# Patient Record
Sex: Female | Born: 1965 | Race: Black or African American | Hispanic: No | Marital: Single | Smoking: Former smoker
Health system: Southern US, Community
[De-identification: ages and names within clinical notes are randomized; demographics above are authoritative.]

## PROBLEM LIST (undated history)

## (undated) DIAGNOSIS — F329 Major depressive disorder, single episode, unspecified: Secondary | ICD-10-CM

## (undated) DIAGNOSIS — E669 Obesity, unspecified: Secondary | ICD-10-CM

## (undated) DIAGNOSIS — R591 Generalized enlarged lymph nodes: Secondary | ICD-10-CM

## (undated) DIAGNOSIS — D500 Iron deficiency anemia secondary to blood loss (chronic): Secondary | ICD-10-CM

## (undated) DIAGNOSIS — K469 Unspecified abdominal hernia without obstruction or gangrene: Principal | ICD-10-CM

## (undated) DIAGNOSIS — G4730 Sleep apnea, unspecified: Secondary | ICD-10-CM

## (undated) DIAGNOSIS — D869 Sarcoidosis, unspecified: Principal | ICD-10-CM

## (undated) DIAGNOSIS — D539 Nutritional anemia, unspecified: Secondary | ICD-10-CM

## (undated) DIAGNOSIS — L409 Psoriasis, unspecified: Secondary | ICD-10-CM

## (undated) DIAGNOSIS — F419 Anxiety disorder, unspecified: Secondary | ICD-10-CM

## (undated) DIAGNOSIS — F32A Depression, unspecified: Secondary | ICD-10-CM

## (undated) DIAGNOSIS — M898X9 Other specified disorders of bone, unspecified site: Principal | ICD-10-CM

## (undated) DIAGNOSIS — I10 Essential (primary) hypertension: Secondary | ICD-10-CM

## (undated) DIAGNOSIS — N921 Excessive and frequent menstruation with irregular cycle: Secondary | ICD-10-CM

## (undated) HISTORY — PX: TUBAL LIGATION: SHX77

## (undated) HISTORY — DX: Depression, unspecified: F32.A

## (undated) HISTORY — DX: Major depressive disorder, single episode, unspecified: F32.9

## (undated) HISTORY — DX: Iron deficiency anemia secondary to blood loss (chronic): D50.0

## (undated) HISTORY — DX: Other specified disorders of bone, unspecified site: M89.8X9

## (undated) HISTORY — DX: Unspecified abdominal hernia without obstruction or gangrene: K46.9

## (undated) HISTORY — DX: Generalized enlarged lymph nodes: R59.1

## (undated) HISTORY — DX: Sarcoidosis, unspecified: D86.9

## (undated) HISTORY — DX: Anxiety disorder, unspecified: F41.9

## (undated) HISTORY — DX: Nutritional anemia, unspecified: D53.9

## (undated) HISTORY — DX: Excessive and frequent menstruation with irregular cycle: N92.1

---

## 2007-10-15 LAB — PULMONARY FUNCTION TEST

## 2008-02-24 LAB — PULMONARY FUNCTION TEST

## 2009-10-21 ENCOUNTER — Inpatient Hospital Stay (HOSPITAL_COMMUNITY): Admission: EM | Admit: 2009-10-21 | Discharge: 2009-10-24 | Payer: Self-pay | Admitting: Emergency Medicine

## 2009-11-01 ENCOUNTER — Encounter: Admission: RE | Admit: 2009-11-01 | Discharge: 2009-11-01 | Payer: Self-pay | Admitting: Internal Medicine

## 2010-10-22 LAB — POCT I-STAT 3, VENOUS BLOOD GAS (G3P V)
Acid-Base Excess: 1 mmol/L (ref 0.0–2.0)
O2 Saturation: 30 %
pCO2, Ven: 44.3 mmHg — ABNORMAL LOW (ref 45.0–50.0)

## 2010-10-22 LAB — URINE CULTURE

## 2010-10-22 LAB — GLUCOSE, CAPILLARY
Glucose-Capillary: 196 mg/dL — ABNORMAL HIGH (ref 70–99)
Glucose-Capillary: 213 mg/dL — ABNORMAL HIGH (ref 70–99)
Glucose-Capillary: 323 mg/dL — ABNORMAL HIGH (ref 70–99)
Glucose-Capillary: 339 mg/dL — ABNORMAL HIGH (ref 70–99)
Glucose-Capillary: 341 mg/dL — ABNORMAL HIGH (ref 70–99)
Glucose-Capillary: 342 mg/dL — ABNORMAL HIGH (ref 70–99)
Glucose-Capillary: 363 mg/dL — ABNORMAL HIGH (ref 70–99)
Glucose-Capillary: 460 mg/dL — ABNORMAL HIGH (ref 70–99)
Glucose-Capillary: 511 mg/dL — ABNORMAL HIGH (ref 70–99)

## 2010-10-22 LAB — DIFFERENTIAL
Basophils Absolute: 0.1 10*3/uL (ref 0.0–0.1)
Eosinophils Relative: 3 % (ref 0–5)
Lymphs Abs: 0.9 10*3/uL (ref 0.7–4.0)
Monocytes Relative: 10 % (ref 3–12)
Neutrophils Relative %: 68 % (ref 43–77)

## 2010-10-22 LAB — COMPREHENSIVE METABOLIC PANEL
AST: 22 U/L (ref 0–37)
BUN: 21 mg/dL (ref 6–23)
CO2: 24 mEq/L (ref 19–32)
Calcium: 10.6 mg/dL — ABNORMAL HIGH (ref 8.4–10.5)
Chloride: 91 mEq/L — ABNORMAL LOW (ref 96–112)
Creatinine, Ser: 1.15 mg/dL (ref 0.4–1.2)
GFR calc Af Amer: >60 mL/min (ref >60)
GFR calc non Af Amer: 51 mL/min — ABNORMAL LOW (ref >60)
Glucose, Bld: 578 mg/dL (ref 70–99)
Total Bilirubin: 0.6 mg/dL (ref 0.3–1.2)

## 2010-10-22 LAB — LIPASE, BLOOD: Lipase: 81 U/L — ABNORMAL HIGH (ref 11–59)

## 2010-10-22 LAB — CBC
HCT: 29.5 % — ABNORMAL LOW (ref 36.0–46.0)
HCT: 33.9 % — ABNORMAL LOW (ref 36.0–46.0)
Hemoglobin: 10.8 g/dL — ABNORMAL LOW (ref 12.0–15.0)
Hemoglobin: 9.2 g/dL — ABNORMAL LOW (ref 12.0–15.0)
Hemoglobin: 9.5 g/dL — ABNORMAL LOW (ref 12.0–15.0)
MCHC: 31.8 g/dL (ref 30.0–36.0)
MCV: 62.5 fL — ABNORMAL LOW (ref 78.0–100.0)
MCV: 63.3 fL — ABNORMAL LOW (ref 78.0–100.0)
MCV: 63.7 fL — ABNORMAL LOW (ref 78.0–100.0)
Platelets: 172 10*3/uL (ref 150–400)
RBC: 4.63 MIL/uL (ref 3.87–5.11)
RBC: 4.75 MIL/uL (ref 3.87–5.11)
RBC: 5.03 MIL/uL (ref 3.87–5.11)
RBC: 5.42 MIL/uL — ABNORMAL HIGH (ref 3.87–5.11)
RDW: 23.3 % — ABNORMAL HIGH (ref 11.5–15.5)
WBC: 3.8 10*3/uL — ABNORMAL LOW (ref 4.0–10.5)
WBC: 3.9 10*3/uL — ABNORMAL LOW (ref 4.0–10.5)
WBC: 5 10*3/uL (ref 4.0–10.5)

## 2010-10-22 LAB — BASIC METABOLIC PANEL
BUN: 7 mg/dL (ref 6–23)
CO2: 22 mEq/L (ref 19–32)
Calcium: 8.4 mg/dL (ref 8.4–10.5)
Chloride: 104 mEq/L (ref 96–112)
Chloride: 106 mEq/L (ref 96–112)
Chloride: 98 mEq/L (ref 96–112)
Creatinine, Ser: 0.74 mg/dL (ref 0.4–1.2)
GFR calc Af Amer: >60 mL/min (ref >60)
GFR calc Af Amer: >60 mL/min (ref >60)
GFR calc non Af Amer: >60 mL/min (ref >60)
GFR calc non Af Amer: >60 mL/min (ref >60)
Glucose, Bld: 444 mg/dL — ABNORMAL HIGH (ref 70–99)
Potassium: 3.9 mEq/L (ref 3.5–5.1)
Potassium: 4.2 mEq/L (ref 3.5–5.1)
Sodium: 130 mEq/L — ABNORMAL LOW (ref 135–145)
Sodium: 133 mEq/L — ABNORMAL LOW (ref 135–145)
Sodium: 136 mEq/L (ref 135–145)

## 2010-10-22 LAB — IRON AND TIBC
Iron: 23 ug/dL — ABNORMAL LOW (ref 42–135)
Saturation Ratios: 6 % — ABNORMAL LOW (ref 20–55)
TIBC: 363 ug/dL (ref 250–470)

## 2010-10-22 LAB — URINALYSIS, ROUTINE W REFLEX MICROSCOPIC
Glucose, UA: >1000 mg/dL — AB
Nitrite: NEGATIVE
Specific Gravity, Urine: 1.027 (ref 1.005–1.030)
pH: 5.5 (ref 5.0–8.0)

## 2010-10-22 LAB — URINE MICROSCOPIC-ADD ON

## 2010-10-22 LAB — MRSA PCR SCREENING: MRSA by PCR: NEGATIVE

## 2010-10-22 LAB — FECAL LACTOFERRIN, QUANT: Fecal Lactoferrin: NEGATIVE

## 2010-10-22 LAB — CLOSTRIDIUM DIFFICILE EIA: C difficile Toxins A+B, EIA: NEGATIVE

## 2010-10-22 LAB — STOOL CULTURE

## 2010-10-22 LAB — FERRITIN: Ferritin: 27 ng/mL (ref 10–291)

## 2010-10-22 LAB — TSH: TSH: 1.214 u[IU]/mL (ref 0.350–4.500)

## 2010-10-22 LAB — FOLATE: Folate: 12.5 ng/mL

## 2010-12-06 ENCOUNTER — Other Ambulatory Visit: Payer: Self-pay | Admitting: Family Medicine

## 2010-12-06 DIAGNOSIS — Z1231 Encounter for screening mammogram for malignant neoplasm of breast: Secondary | ICD-10-CM

## 2010-12-11 ENCOUNTER — Encounter (HOSPITAL_BASED_OUTPATIENT_CLINIC_OR_DEPARTMENT_OTHER): Payer: Medicare Other | Admitting: Oncology

## 2010-12-11 ENCOUNTER — Other Ambulatory Visit: Payer: Self-pay | Admitting: Oncology

## 2010-12-11 ENCOUNTER — Encounter (HOSPITAL_COMMUNITY): Payer: Medicare Other | Attending: Oncology

## 2010-12-11 ENCOUNTER — Encounter: Payer: Self-pay | Admitting: Oncology

## 2010-12-11 DIAGNOSIS — D649 Anemia, unspecified: Secondary | ICD-10-CM

## 2010-12-11 DIAGNOSIS — D509 Iron deficiency anemia, unspecified: Secondary | ICD-10-CM

## 2010-12-11 LAB — CHCC SMEAR

## 2010-12-11 LAB — CBC & DIFF AND RETIC
Basophils Absolute: 0 10*3/uL (ref 0.0–0.1)
Eosinophils Absolute: 0.1 10*3/uL (ref 0.0–0.5)
HGB: 7.6 g/dL — ABNORMAL LOW (ref 11.6–15.9)
Immature Retic Fract: 13.9 % — ABNORMAL HIGH (ref 0.00–10.70)
MONO#: 0.4 10*3/uL (ref 0.1–0.9)
NEUT#: 2.9 10*3/uL (ref 1.5–6.5)
RBC: 4.73 10*6/uL (ref 3.70–5.45)
RDW: 20.4 % — ABNORMAL HIGH (ref 11.2–14.5)
Retic %: 0.96 % (ref 0.50–1.50)
Retic Ct Abs: 45.41 10*3/uL (ref 18.30–72.70)
WBC: 4.6 10*3/uL (ref 3.9–10.3)
lymph#: 1.2 10*3/uL (ref 0.9–3.3)

## 2010-12-11 LAB — MORPHOLOGY: PLT EST: ADEQUATE

## 2010-12-12 LAB — COMPREHENSIVE METABOLIC PANEL
ALT: 15 U/L (ref 0–35)
AST: 14 U/L (ref 0–37)
Calcium: 10.2 mg/dL (ref 8.4–10.5)
Chloride: 105 mEq/L (ref 96–112)
Creatinine, Ser: 0.9 mg/dL (ref 0.40–1.20)

## 2010-12-12 LAB — HAPTOGLOBIN: Haptoglobin: 246 mg/dL — ABNORMAL HIGH (ref 16–200)

## 2010-12-12 LAB — IRON AND TIBC
Iron: 22 ug/dL — ABNORMAL LOW (ref 42–145)
UIBC: 448 ug/dL

## 2010-12-12 LAB — FERRITIN: Ferritin: 6 ng/mL — ABNORMAL LOW (ref 10–291)

## 2010-12-13 ENCOUNTER — Ambulatory Visit
Admission: RE | Admit: 2010-12-13 | Discharge: 2010-12-13 | Disposition: A | Discharge status: Home / Self Care | Payer: Medicare Other | Source: Ambulatory Visit | Attending: Family Medicine | Admitting: Family Medicine

## 2010-12-13 DIAGNOSIS — Z1231 Encounter for screening mammogram for malignant neoplasm of breast: Secondary | ICD-10-CM

## 2010-12-19 ENCOUNTER — Encounter (HOSPITAL_COMMUNITY): Payer: Self-pay

## 2010-12-19 ENCOUNTER — Ambulatory Visit (HOSPITAL_COMMUNITY)
Admission: RE | Admit: 2010-12-19 | Discharge: 2010-12-19 | Disposition: A | Discharge status: Home / Self Care | Payer: Medicare Other | Source: Ambulatory Visit | Attending: Oncology | Admitting: Oncology

## 2010-12-19 DIAGNOSIS — M51379 Other intervertebral disc degeneration, lumbosacral region without mention of lumbar back pain or lower extremity pain: Secondary | ICD-10-CM | POA: Insufficient documentation

## 2010-12-19 DIAGNOSIS — K639 Disease of intestine, unspecified: Secondary | ICD-10-CM | POA: Insufficient documentation

## 2010-12-19 DIAGNOSIS — K921 Melena: Secondary | ICD-10-CM | POA: Insufficient documentation

## 2010-12-19 DIAGNOSIS — R142 Eructation: Secondary | ICD-10-CM | POA: Insufficient documentation

## 2010-12-19 DIAGNOSIS — M5137 Other intervertebral disc degeneration, lumbosacral region: Secondary | ICD-10-CM | POA: Insufficient documentation

## 2010-12-19 DIAGNOSIS — D649 Anemia, unspecified: Secondary | ICD-10-CM | POA: Insufficient documentation

## 2010-12-19 DIAGNOSIS — R599 Enlarged lymph nodes, unspecified: Secondary | ICD-10-CM | POA: Insufficient documentation

## 2010-12-19 DIAGNOSIS — R197 Diarrhea, unspecified: Secondary | ICD-10-CM | POA: Insufficient documentation

## 2010-12-19 DIAGNOSIS — R141 Gas pain: Secondary | ICD-10-CM | POA: Insufficient documentation

## 2010-12-19 DIAGNOSIS — R1032 Left lower quadrant pain: Secondary | ICD-10-CM | POA: Insufficient documentation

## 2010-12-19 HISTORY — DX: Essential (primary) hypertension: I10

## 2010-12-19 MED ORDER — IOHEXOL 300 MG/ML  SOLN
125.0000 mL | Freq: Once | INTRAMUSCULAR | Status: AC | PRN
  Administered 2010-12-19: 125 mL via INTRAVENOUS

## 2010-12-25 ENCOUNTER — Encounter (HOSPITAL_BASED_OUTPATIENT_CLINIC_OR_DEPARTMENT_OTHER): Payer: Medicare Other | Admitting: Oncology

## 2010-12-25 ENCOUNTER — Other Ambulatory Visit: Payer: Self-pay | Admitting: Oncology

## 2010-12-25 DIAGNOSIS — D509 Iron deficiency anemia, unspecified: Secondary | ICD-10-CM

## 2010-12-25 LAB — CBC WITH DIFFERENTIAL/PLATELET
BASO%: 1 % (ref 0.0–2.0)
Eosinophils Absolute: 0.1 10*3/uL (ref 0.0–0.5)
HCT: 29.5 % — ABNORMAL LOW (ref 34.8–46.6)
MCHC: 29.2 g/dL — ABNORMAL LOW (ref 31.5–36.0)
MONO#: 0.5 10*3/uL (ref 0.1–0.9)
NEUT#: 3 10*3/uL (ref 1.5–6.5)
Platelets: 236 10*3/uL (ref 145–400)
RBC: 4.94 10*6/uL (ref 3.70–5.45)
WBC: 4.8 10*3/uL (ref 3.9–10.3)
lymph#: 1.1 10*3/uL (ref 0.9–3.3)
nRBC: 0 % (ref 0–0)

## 2011-01-02 ENCOUNTER — Encounter (HOSPITAL_BASED_OUTPATIENT_CLINIC_OR_DEPARTMENT_OTHER): Payer: Medicare Other | Admitting: Oncology

## 2011-01-02 ENCOUNTER — Other Ambulatory Visit: Payer: Self-pay | Admitting: Oncology

## 2011-01-02 DIAGNOSIS — D649 Anemia, unspecified: Secondary | ICD-10-CM

## 2011-01-02 DIAGNOSIS — R591 Generalized enlarged lymph nodes: Secondary | ICD-10-CM

## 2011-01-02 DIAGNOSIS — D509 Iron deficiency anemia, unspecified: Secondary | ICD-10-CM

## 2011-01-02 DIAGNOSIS — R599 Enlarged lymph nodes, unspecified: Secondary | ICD-10-CM

## 2011-01-02 DIAGNOSIS — I10 Essential (primary) hypertension: Secondary | ICD-10-CM

## 2011-01-02 DIAGNOSIS — E119 Type 2 diabetes mellitus without complications: Secondary | ICD-10-CM

## 2011-01-02 LAB — CBC WITH DIFFERENTIAL/PLATELET
Basophils Absolute: 0 10*3/uL (ref 0.0–0.1)
EOS%: 2.5 % (ref 0.0–7.0)
Eosinophils Absolute: 0.1 10*3/uL (ref 0.0–0.5)
HCT: 31.8 % — ABNORMAL LOW (ref 34.8–46.6)
HGB: 9.4 g/dL — ABNORMAL LOW (ref 11.6–15.9)
MONO#: 0.7 10*3/uL (ref 0.1–0.9)
NEUT#: 2.4 10*3/uL (ref 1.5–6.5)
NEUT%: 49.1 % (ref 38.4–76.8)
RDW: 27.3 % — ABNORMAL HIGH (ref 11.2–14.5)
WBC: 4.8 10*3/uL (ref 3.9–10.3)
lymph#: 1.6 10*3/uL (ref 0.9–3.3)

## 2011-01-02 LAB — FERRITIN: Ferritin: 68 ng/mL (ref 10–291)

## 2011-01-22 ENCOUNTER — Encounter (HOSPITAL_BASED_OUTPATIENT_CLINIC_OR_DEPARTMENT_OTHER): Payer: Medicare Other | Admitting: Oncology

## 2011-01-22 ENCOUNTER — Other Ambulatory Visit: Payer: Self-pay | Admitting: Oncology

## 2011-01-22 DIAGNOSIS — D509 Iron deficiency anemia, unspecified: Secondary | ICD-10-CM

## 2011-01-22 LAB — CBC WITH DIFFERENTIAL/PLATELET
BASO%: 0.1 % (ref 0.0–2.0)
Eosinophils Absolute: 0.2 10*3/uL (ref 0.0–0.5)
HCT: 32.7 % — ABNORMAL LOW (ref 34.8–46.6)
HGB: 10 g/dL — ABNORMAL LOW (ref 11.6–15.9)
MCHC: 30.4 g/dL — ABNORMAL LOW (ref 31.5–36.0)
MONO#: 0.6 10*3/uL (ref 0.1–0.9)
NEUT#: 2.9 10*3/uL (ref 1.5–6.5)
NEUT%: 60.3 % (ref 38.4–76.8)
WBC: 4.8 10*3/uL (ref 3.9–10.3)
lymph#: 1.1 10*3/uL (ref 0.9–3.3)

## 2011-02-06 ENCOUNTER — Encounter (HOSPITAL_BASED_OUTPATIENT_CLINIC_OR_DEPARTMENT_OTHER): Payer: Medicare Other | Admitting: Oncology

## 2011-02-06 ENCOUNTER — Other Ambulatory Visit: Payer: Self-pay | Admitting: Oncology

## 2011-02-06 DIAGNOSIS — D509 Iron deficiency anemia, unspecified: Secondary | ICD-10-CM

## 2011-02-06 LAB — CBC WITH DIFFERENTIAL/PLATELET
Basophils Absolute: 0 10*3/uL (ref 0.0–0.1)
EOS%: 3.4 % (ref 0.0–7.0)
HCT: 30.8 % — ABNORMAL LOW (ref 34.8–46.6)
HGB: 9.5 g/dL — ABNORMAL LOW (ref 11.6–15.9)
MCH: 19.4 pg — ABNORMAL LOW (ref 25.1–34.0)
MONO#: 0.5 10*3/uL (ref 0.1–0.9)
NEUT%: 58 % (ref 38.4–76.8)
Platelets: 260 10*3/uL (ref 145–400)
lymph#: 1.3 10*3/uL (ref 0.9–3.3)

## 2011-02-12 ENCOUNTER — Ambulatory Visit (INDEPENDENT_AMBULATORY_CARE_PROVIDER_SITE_OTHER): Payer: Medicare Other

## 2011-02-12 ENCOUNTER — Inpatient Hospital Stay (INDEPENDENT_AMBULATORY_CARE_PROVIDER_SITE_OTHER)
Admission: RE | Admit: 2011-02-12 | Discharge: 2011-02-12 | Disposition: A | Discharge status: Home / Self Care | Payer: Medicare Other | Source: Ambulatory Visit | Attending: Family Medicine | Admitting: Family Medicine

## 2011-02-12 DIAGNOSIS — M109 Gout, unspecified: Secondary | ICD-10-CM

## 2011-02-27 ENCOUNTER — Ambulatory Visit (HOSPITAL_COMMUNITY)
Admission: RE | Admit: 2011-02-27 | Discharge: 2011-02-27 | Disposition: A | Discharge status: Home / Self Care | Payer: Medicare Other | Source: Ambulatory Visit | Attending: Gastroenterology | Admitting: Gastroenterology

## 2011-02-27 DIAGNOSIS — K648 Other hemorrhoids: Secondary | ICD-10-CM | POA: Insufficient documentation

## 2011-02-27 DIAGNOSIS — D649 Anemia, unspecified: Secondary | ICD-10-CM | POA: Insufficient documentation

## 2011-02-27 DIAGNOSIS — K625 Hemorrhage of anus and rectum: Secondary | ICD-10-CM | POA: Insufficient documentation

## 2011-02-27 DIAGNOSIS — K644 Residual hemorrhoidal skin tags: Secondary | ICD-10-CM | POA: Insufficient documentation

## 2011-02-28 ENCOUNTER — Ambulatory Visit: Payer: Medicare Other | Admitting: Gastroenterology

## 2011-03-04 ENCOUNTER — Other Ambulatory Visit: Payer: Self-pay | Admitting: Oncology

## 2011-03-04 ENCOUNTER — Encounter (HOSPITAL_BASED_OUTPATIENT_CLINIC_OR_DEPARTMENT_OTHER): Payer: Medicare Other | Admitting: Oncology

## 2011-03-04 DIAGNOSIS — N924 Excessive bleeding in the premenopausal period: Secondary | ICD-10-CM

## 2011-03-04 DIAGNOSIS — D509 Iron deficiency anemia, unspecified: Secondary | ICD-10-CM

## 2011-03-04 LAB — COMPREHENSIVE METABOLIC PANEL
Albumin: 4.1 g/dL (ref 3.5–5.2)
Alkaline Phosphatase: 68 U/L (ref 39–117)
BUN: 15 mg/dL (ref 6–23)
Glucose, Bld: 154 mg/dL — ABNORMAL HIGH (ref 70–99)
Total Bilirubin: 0.3 mg/dL (ref 0.3–1.2)

## 2011-03-04 LAB — FERRITIN: Ferritin: 16 ng/mL (ref 10–291)

## 2011-03-04 LAB — IRON AND TIBC
TIBC: 400 ug/dL (ref 250–470)
UIBC: 367 ug/dL

## 2011-03-04 LAB — CBC WITH DIFFERENTIAL/PLATELET
Basophils Absolute: 0 10*3/uL (ref 0.0–0.1)
Eosinophils Absolute: 0.1 10*3/uL (ref 0.0–0.5)
HGB: 9.9 g/dL — ABNORMAL LOW (ref 11.6–15.9)
LYMPH%: 16.9 % (ref 14.0–49.7)
MCV: 62.9 fL — ABNORMAL LOW (ref 79.5–101.0)
MONO%: 9.9 % (ref 0.0–14.0)
NEUT#: 3.1 10*3/uL (ref 1.5–6.5)
NEUT%: 70.3 % (ref 38.4–76.8)
Platelets: 254 10*3/uL (ref 145–400)

## 2011-03-05 ENCOUNTER — Encounter (HOSPITAL_COMMUNITY): Payer: Medicare Other | Attending: Family Medicine

## 2011-03-05 DIAGNOSIS — E7800 Pure hypercholesterolemia, unspecified: Secondary | ICD-10-CM | POA: Insufficient documentation

## 2011-03-05 DIAGNOSIS — I10 Essential (primary) hypertension: Secondary | ICD-10-CM | POA: Insufficient documentation

## 2011-03-05 DIAGNOSIS — E669 Obesity, unspecified: Secondary | ICD-10-CM | POA: Insufficient documentation

## 2011-03-05 DIAGNOSIS — Z5189 Encounter for other specified aftercare: Secondary | ICD-10-CM | POA: Insufficient documentation

## 2011-03-05 DIAGNOSIS — J45909 Unspecified asthma, uncomplicated: Secondary | ICD-10-CM | POA: Insufficient documentation

## 2011-03-05 DIAGNOSIS — G4730 Sleep apnea, unspecified: Secondary | ICD-10-CM | POA: Insufficient documentation

## 2011-03-05 DIAGNOSIS — E119 Type 2 diabetes mellitus without complications: Secondary | ICD-10-CM | POA: Insufficient documentation

## 2011-03-07 ENCOUNTER — Encounter (HOSPITAL_COMMUNITY)
Admission: RE | Admit: 2011-03-07 | Discharge: 2011-03-07 | Payer: Medicare Other | Source: Ambulatory Visit | Attending: Family Medicine | Admitting: Family Medicine

## 2011-03-07 ENCOUNTER — Other Ambulatory Visit: Payer: Self-pay | Admitting: Family Medicine

## 2011-03-07 LAB — GLUCOSE, CAPILLARY: Glucose-Capillary: 118 mg/dL — ABNORMAL HIGH (ref 70–99)

## 2011-03-12 ENCOUNTER — Encounter (HOSPITAL_COMMUNITY): Payer: Medicare Other

## 2011-03-14 ENCOUNTER — Encounter (HOSPITAL_COMMUNITY): Payer: Medicare Other

## 2011-03-19 ENCOUNTER — Encounter (HOSPITAL_COMMUNITY): Payer: Medicare Other

## 2011-03-21 ENCOUNTER — Encounter (HOSPITAL_COMMUNITY): Payer: Medicare Other

## 2011-03-26 ENCOUNTER — Encounter (HOSPITAL_COMMUNITY): Payer: Medicare Other

## 2011-03-28 ENCOUNTER — Encounter (HOSPITAL_COMMUNITY): Payer: Medicare Other

## 2011-04-02 ENCOUNTER — Encounter (HOSPITAL_COMMUNITY): Payer: Medicare Other | Attending: Family Medicine

## 2011-04-02 DIAGNOSIS — J45909 Unspecified asthma, uncomplicated: Secondary | ICD-10-CM | POA: Insufficient documentation

## 2011-04-02 DIAGNOSIS — I10 Essential (primary) hypertension: Secondary | ICD-10-CM | POA: Insufficient documentation

## 2011-04-02 DIAGNOSIS — E669 Obesity, unspecified: Secondary | ICD-10-CM | POA: Insufficient documentation

## 2011-04-02 DIAGNOSIS — Z5189 Encounter for other specified aftercare: Secondary | ICD-10-CM | POA: Insufficient documentation

## 2011-04-02 DIAGNOSIS — E119 Type 2 diabetes mellitus without complications: Secondary | ICD-10-CM | POA: Insufficient documentation

## 2011-04-02 DIAGNOSIS — E7800 Pure hypercholesterolemia, unspecified: Secondary | ICD-10-CM | POA: Insufficient documentation

## 2011-04-02 DIAGNOSIS — G4730 Sleep apnea, unspecified: Secondary | ICD-10-CM | POA: Insufficient documentation

## 2011-04-04 ENCOUNTER — Encounter (HOSPITAL_COMMUNITY): Payer: Medicare Other

## 2011-04-09 ENCOUNTER — Encounter (HOSPITAL_COMMUNITY): Payer: Medicare Other

## 2011-04-11 ENCOUNTER — Encounter (HOSPITAL_COMMUNITY): Payer: Medicare Other

## 2011-04-16 ENCOUNTER — Encounter (HOSPITAL_COMMUNITY): Payer: Medicare Other

## 2011-04-18 ENCOUNTER — Encounter (HOSPITAL_COMMUNITY): Payer: Medicare Other

## 2011-04-21 NOTE — Op Note (Signed)
  NAMEJLEIGH, Cassandra Burns NO.:  [id number]  MEDICAL RECORD NO.:  [id number]  LOCATION:  [facility]                         FACILITY:  [facility]  PHYSICIAN:  Sharri Loya C. Madilyn Fireman, M.D.    DATE OF BIRTH:  03/01/1966  DATE OF PROCEDURE:  02/27/2011 DATE OF DISCHARGE:                              OPERATIVE REPORT   SURGEON:  Icy Fuhrmann C. Madilyn Fireman, MD  INDICATIONS FOR PROCEDURE:  Rectal bleeding and significant anemia.  PROCEDURE:  The patient was placed in the left lateral decubitus position and placed on the pulse monitor with continuous low-flow oxygen delivered by nasal cannula.  She was sedated with 75 mcg of IV fentanyl and 6 mg of IV Versed.  The Olympus videocolonoscope was inserted into the rectum and advanced to the cecum, confirmed by transillumination at McBurney's point and visualization of the ileocecal valve and appendiceal orifice.  The prep was excellent.  The cecum, ascending, transverse, descending and sigmoid colon all appeared normal with no masses, polyps, diverticuli, or other mucosal abnormalities.  The rectum likewise appeared normal and retroflexed view of the anus did reveal some hyperemic-appearing slightly enlarged internal hemorrhoids as well as some similar-appearing external hemorrhoids without any dominant or thrombosed hemorrhoids seen and no bleeding.  The scope was then withdrawn and the patient returned to the recovery room in stable condition.  She tolerated the procedure well.  There were no immediate complications.  IMPRESSION:  Internal and external hemorrhoids probably accounting for rectal bleeding, although likely not accounting for the degree of anemia, which could be due to a combination of hemorrhoidal loss and heavy period.  PLAN:  Returned to her oncologist for presumed iron therapy.  If ongoing suspicion of GI blood loss, proximal to the colon could pursue EGD, but suspect this would be low yield.     ______________________________ Everardo All. Madilyn Fireman, M.D.     JCH/MEDQ  D:  02/27/2011  T:  02/28/2011  Job:  [id number]  cc:   Exie Parody, M.D.  Electronically Signed by Dorena Cookey M.D. on 04/21/2011 11:38:16 AM

## 2011-04-23 ENCOUNTER — Encounter (HOSPITAL_COMMUNITY): Payer: Medicare Other

## 2011-04-25 ENCOUNTER — Encounter (HOSPITAL_COMMUNITY): Payer: Medicare Other

## 2011-04-29 ENCOUNTER — Other Ambulatory Visit: Payer: Self-pay | Admitting: Oncology

## 2011-04-29 ENCOUNTER — Encounter (HOSPITAL_BASED_OUTPATIENT_CLINIC_OR_DEPARTMENT_OTHER): Payer: Medicare Other | Admitting: Oncology

## 2011-04-29 DIAGNOSIS — N921 Excessive and frequent menstruation with irregular cycle: Secondary | ICD-10-CM

## 2011-04-29 DIAGNOSIS — D509 Iron deficiency anemia, unspecified: Secondary | ICD-10-CM

## 2011-04-29 DIAGNOSIS — I10 Essential (primary) hypertension: Secondary | ICD-10-CM

## 2011-04-29 DIAGNOSIS — D500 Iron deficiency anemia secondary to blood loss (chronic): Secondary | ICD-10-CM

## 2011-04-29 LAB — CBC WITH DIFFERENTIAL/PLATELET
BASO%: 0.2 % (ref 0.0–2.0)
EOS%: 2.4 % (ref 0.0–7.0)
HCT: 30.5 % — ABNORMAL LOW (ref 34.8–46.6)
MCH: 18.5 pg — ABNORMAL LOW (ref 25.1–34.0)
MCHC: 30.8 g/dL — ABNORMAL LOW (ref 31.5–36.0)
MONO%: 8.2 % (ref 0.0–14.0)
NEUT%: 67.1 % (ref 38.4–76.8)
lymph#: 1 10*3/uL (ref 0.9–3.3)

## 2011-04-30 ENCOUNTER — Encounter (HOSPITAL_COMMUNITY): Payer: Medicare Other

## 2011-05-02 ENCOUNTER — Encounter (HOSPITAL_COMMUNITY): Payer: Medicare Other | Attending: Family Medicine

## 2011-05-02 DIAGNOSIS — E669 Obesity, unspecified: Secondary | ICD-10-CM | POA: Insufficient documentation

## 2011-05-02 DIAGNOSIS — E119 Type 2 diabetes mellitus without complications: Secondary | ICD-10-CM | POA: Insufficient documentation

## 2011-05-02 DIAGNOSIS — G4730 Sleep apnea, unspecified: Secondary | ICD-10-CM | POA: Insufficient documentation

## 2011-05-02 DIAGNOSIS — E7800 Pure hypercholesterolemia, unspecified: Secondary | ICD-10-CM | POA: Insufficient documentation

## 2011-05-02 DIAGNOSIS — Z5189 Encounter for other specified aftercare: Secondary | ICD-10-CM | POA: Insufficient documentation

## 2011-05-02 DIAGNOSIS — J45909 Unspecified asthma, uncomplicated: Secondary | ICD-10-CM | POA: Insufficient documentation

## 2011-05-02 DIAGNOSIS — I10 Essential (primary) hypertension: Secondary | ICD-10-CM | POA: Insufficient documentation

## 2011-05-07 ENCOUNTER — Encounter (HOSPITAL_COMMUNITY): Payer: Medicare Other

## 2011-05-09 ENCOUNTER — Encounter (HOSPITAL_COMMUNITY): Payer: Medicare Other

## 2011-05-10 ENCOUNTER — Encounter: Payer: Self-pay | Admitting: Oncology

## 2011-05-10 DIAGNOSIS — I10 Essential (primary) hypertension: Secondary | ICD-10-CM | POA: Insufficient documentation

## 2011-05-10 DIAGNOSIS — N921 Excessive and frequent menstruation with irregular cycle: Secondary | ICD-10-CM | POA: Insufficient documentation

## 2011-05-10 DIAGNOSIS — D500 Iron deficiency anemia secondary to blood loss (chronic): Secondary | ICD-10-CM | POA: Insufficient documentation

## 2011-05-14 ENCOUNTER — Encounter (HOSPITAL_COMMUNITY): Payer: Medicare Other

## 2011-05-16 ENCOUNTER — Encounter (HOSPITAL_COMMUNITY): Payer: Medicare Other

## 2011-05-21 ENCOUNTER — Encounter (HOSPITAL_COMMUNITY): Payer: Medicare Other

## 2011-05-23 ENCOUNTER — Encounter (HOSPITAL_COMMUNITY): Payer: Medicare Other

## 2011-05-28 ENCOUNTER — Encounter (HOSPITAL_COMMUNITY): Payer: Medicare Other

## 2011-05-30 ENCOUNTER — Encounter (HOSPITAL_COMMUNITY): Payer: Medicare Other

## 2011-05-30 DIAGNOSIS — E669 Obesity, unspecified: Secondary | ICD-10-CM | POA: Insufficient documentation

## 2011-05-30 DIAGNOSIS — E119 Type 2 diabetes mellitus without complications: Secondary | ICD-10-CM | POA: Insufficient documentation

## 2011-05-30 DIAGNOSIS — G4730 Sleep apnea, unspecified: Secondary | ICD-10-CM | POA: Insufficient documentation

## 2011-05-30 DIAGNOSIS — E7800 Pure hypercholesterolemia, unspecified: Secondary | ICD-10-CM | POA: Insufficient documentation

## 2011-05-30 DIAGNOSIS — J45909 Unspecified asthma, uncomplicated: Secondary | ICD-10-CM | POA: Insufficient documentation

## 2011-05-30 DIAGNOSIS — I10 Essential (primary) hypertension: Secondary | ICD-10-CM | POA: Insufficient documentation

## 2011-05-30 DIAGNOSIS — Z5189 Encounter for other specified aftercare: Secondary | ICD-10-CM | POA: Insufficient documentation

## 2011-06-03 ENCOUNTER — Telehealth: Payer: Self-pay | Admitting: Oncology

## 2011-06-04 ENCOUNTER — Telehealth: Payer: Self-pay | Admitting: Oncology

## 2011-06-04 ENCOUNTER — Encounter (HOSPITAL_COMMUNITY): Payer: Medicare Other

## 2011-06-06 ENCOUNTER — Encounter (HOSPITAL_COMMUNITY): Payer: Medicare Other

## 2011-06-07 ENCOUNTER — Other Ambulatory Visit (HOSPITAL_COMMUNITY): Payer: Medicare Other

## 2011-06-07 ENCOUNTER — Ambulatory Visit (HOSPITAL_COMMUNITY)
Admission: RE | Admit: 2011-06-07 | Discharge: 2011-06-07 | Disposition: A | Discharge status: Home / Self Care | Payer: Medicare Other | Source: Ambulatory Visit | Attending: Oncology | Admitting: Oncology

## 2011-06-07 DIAGNOSIS — R591 Generalized enlarged lymph nodes: Secondary | ICD-10-CM

## 2011-06-07 DIAGNOSIS — D259 Leiomyoma of uterus, unspecified: Secondary | ICD-10-CM | POA: Insufficient documentation

## 2011-06-07 DIAGNOSIS — D649 Anemia, unspecified: Secondary | ICD-10-CM

## 2011-06-07 DIAGNOSIS — R599 Enlarged lymph nodes, unspecified: Secondary | ICD-10-CM

## 2011-06-07 MED ORDER — IOHEXOL 300 MG/ML  SOLN
100.0000 mL | Freq: Once | INTRAMUSCULAR | Status: AC | PRN
  Administered 2011-06-07: 100 mL via INTRAVENOUS

## 2011-06-10 ENCOUNTER — Telehealth: Payer: Self-pay | Admitting: Oncology

## 2011-06-10 ENCOUNTER — Ambulatory Visit (HOSPITAL_BASED_OUTPATIENT_CLINIC_OR_DEPARTMENT_OTHER): Payer: Medicare Other | Admitting: Oncology

## 2011-06-10 ENCOUNTER — Other Ambulatory Visit (HOSPITAL_BASED_OUTPATIENT_CLINIC_OR_DEPARTMENT_OTHER): Payer: Medicare Other | Admitting: Lab

## 2011-06-10 ENCOUNTER — Other Ambulatory Visit: Payer: Self-pay | Admitting: Oncology

## 2011-06-10 VITALS — BP 142/88 | HR 85 | Temp 97.4°F | Ht 59.0 in | Wt 216.0 lb

## 2011-06-10 DIAGNOSIS — N921 Excessive and frequent menstruation with irregular cycle: Secondary | ICD-10-CM

## 2011-06-10 DIAGNOSIS — D509 Iron deficiency anemia, unspecified: Secondary | ICD-10-CM

## 2011-06-10 DIAGNOSIS — I10 Essential (primary) hypertension: Secondary | ICD-10-CM

## 2011-06-10 DIAGNOSIS — E119 Type 2 diabetes mellitus without complications: Secondary | ICD-10-CM

## 2011-06-10 DIAGNOSIS — D649 Anemia, unspecified: Secondary | ICD-10-CM

## 2011-06-10 LAB — COMPREHENSIVE METABOLIC PANEL
ALT: 10 U/L (ref 0–35)
AST: 15 U/L (ref 0–37)
Albumin: 4 g/dL (ref 3.5–5.2)
Alkaline Phosphatase: 67 U/L (ref 39–117)
Alkaline Phosphatase: 67 U/L (ref 39–117)
CO2: 24 mEq/L (ref 19–32)
Glucose, Bld: 122 mg/dL — ABNORMAL HIGH (ref 70–99)
Potassium: 3.9 mEq/L (ref 3.5–5.3)
Sodium: 140 mEq/L (ref 135–145)
Sodium: 140 mEq/L (ref 135–145)
Total Bilirubin: 0.3 mg/dL (ref 0.3–1.2)
Total Protein: 7.6 g/dL (ref 6.0–8.3)
Total Protein: 7.6 g/dL (ref 6.0–8.3)

## 2011-06-10 LAB — CBC WITH DIFFERENTIAL/PLATELET
BASO%: 0.3 % (ref 0.0–2.0)
Basophils Absolute: 0 10*3/uL (ref 0.0–0.1)
EOS%: 4.5 % (ref 0.0–7.0)
HGB: 9 g/dL — ABNORMAL LOW (ref 11.6–15.9)
MCH: 19.2 pg — ABNORMAL LOW (ref 25.1–34.0)
MCHC: 30.9 g/dL — ABNORMAL LOW (ref 31.5–36.0)
RBC: 4.67 10*6/uL (ref 3.70–5.45)
RDW: 22.2 % — ABNORMAL HIGH (ref 11.2–14.5)
lymph#: 0.6 10*3/uL — ABNORMAL LOW (ref 0.9–3.3)

## 2011-06-10 LAB — IRON AND TIBC
Iron: 23 ug/dL — ABNORMAL LOW (ref 42–145)
UIBC: 398 ug/dL (ref 125–400)

## 2011-06-10 NOTE — Telephone Encounter (Signed)
gv pt appt schedule for jan thru may °

## 2011-06-10 NOTE — Progress Notes (Signed)
[facility] OFFICE PROGRESS NOTE  CC: Cassandra Burns. Cassandra Howard, MD  Cassandra Burns, M.D.   DIAGNOSIS:  Microcytic anemia from iron deficiency anemia from most likely menometrorrhagia.  She had negative GI workup.   CURRENT THERAPY:  parenteral iron infusion prn. Her last IV Cassandra Burns was in May 2012; transfusion for Hgb <7 for severe anemia.  INTERVAL HISTORY: Cassandra Burns 45 y.o. female returns for regular follow up with her daughter.  She still has monometrorrhagia. She has not been on any oral contraceptive area she denies any family history of blood clot or personal history of blood clots. She had evaluation by GI that was negative for any GI source of bleeding.  Beside vaginal bleeding from her menstruation, she does not see any bleeding anywhere else. She denies city epistaxis, hemoptysis, hematemesis, melena, hematochezia, hematuria. She denies history of symptoms of anemia such as chest pain, palpitation, dizziness, city fatigue.  She has had mid epigastric abdominal pain for many years and this pain has been now less severe with a pain described as mild. The pain is crampy, and its severity depends on how much and what she eats.  She does not require chronic pain medication for over-the-counter pain medication for the pain.   The pain does not radiate anywhere else from midepigastric area.   MEDICAL HISTORY: Past Medical History  Diagnosis Date  . Diabetes mellitus   . Hypertension   . Asthma   . Iron deficiency anemia due to chronic blood loss   . Menometrorrhagia     INTERIM HISTORY: has Menometrorrhagia; Iron deficiency anemia due to chronic blood loss; and Hypertension on her problem list.    ALLERGIES:   has no known allergies.  MEDICATIONS:  Current Outpatient Prescriptions  Medication Sig Dispense Refill  . amLODipine (NORVASC) 5 MG tablet Take 5 mg by mouth 2 (two) times daily.        [name] glipiZIDE (GLUCOTROL) 5 MG tablet Take 5 mg by mouth 2 (two) times  daily before a meal.        . loratadine (CLARITIN) 10 MG tablet Take 10 mg by mouth daily.        [name] losartan (COZAAR) 100 MG tablet Take 100 mg by mouth daily.        . metFORMIN (GLUCOPHAGE) 500 MG tablet Take 500 mg by mouth.          SURGICAL HISTORY: No past surgical history on file.  REVIEW OF SYSTEMS:  Pertinent items are noted in HPI.   PHYSICAL EXAMINATION: ECOG PERFORMANCE STATUS: 0  General:  Obese woman in no acute distress.  Eyes:  no scleral icterus.  ENT:  There were no oropharyngeal lesions.  Neck was without thyromegaly.  Lymphatics:  Negative cervical, supraclavicular or axillary adenopathy.  Respiratory: lungs were clear bilaterally without wheezing or crackles.  Cardiovascular:  Regular rate and rhythm, S1/S2, without murmur, rub or gallop.  There was no pedal edema.  GI:  abdomen was soft, obese, nontender, nondistended, without organomegaly.  Muscoloskeletal:  no spinal tenderness of palpation of vertebral spine.  Skin exam was without echymosis, petichae.  Neuro exam was nonfocal.  Patient was able to get on and off exam table without assistance.  Gait was normal.  Patient was alerted and oriented.  Attention was good.   Language was appropriate.  Mood was normal without depression.  Speech was not pressured.  Thought content was not tangential.     Filed Vitals:   06/10/11 1213  BP: 142/88  Pulse: 85  Temp: 97.4 F (36.3 C)   Wt Readings from Last 3 Encounters:  06/10/11 216 lb (97.977 kg)  03/04/11 215 lb 2 oz (97.58 kg)    LABORATORY/RADIOLOGY DATA: WBC 3.8; Hgb 9.0; Plt 305 LFT within normal limit; Ca 9.9. % sat 5; Iron 23; Ferritin 10; TIBC 421.    Ct Abdomen Pelvis W Contrast:  I personally reviewed this CT and showed the patient and her daughter the picture.  There was stable paripancreatic node.  06/07/2011 Clinical Data: Follow up adenopathy  CT ABDOMEN AND PELVIS WITH CONTRAST  Technique:  Multidetector CT imaging of the abdomen and pelvis was  performed following the standard protocol during bolus administration of intravenous contrast.  Contrast: OMNIPAQUE IOHEXOL 300 MG/ML IV SOLN  Comparison: 12/19/2010  Findings:  Left hilar lymph node is stable measuring 1 cm, image #3.  No pericardial or pleural effusion.  There is no focal liver abnormality.  The spleen appears within normal limits.  Both of the adrenal glands are normal.  Normal appearance of the pancreas.  Gallbladder is normal.  The biliary tree is unremarkable. Kidneys are negative.  Peripancreatic lymph node measures 1.8 cm, image 24.  Stable from previous exam. Portocaval lymph node measures 1.6 cm, image 29.  Previously 2.1 cm.  The splenic hilum lymph node measures 1.8 cm, image number 29. Previously 2.0 cm.  Left external iliac lymph node measures 1.2 cm, image 72. Previously 1.1 cm. Fibroid uterus.  This appears similar to previous examination.  Urinary bladder appears within normal limits.  The stomach and the small bowel loops are unremarkable.  The appendix is identified and appears normal.  The colon is negative.  Review of the visualized osseous structures is significant for mild lumbar degenerative disc disease.  There is a periumbilical hernia which contains fat only.  IMPRESSION:  1.  No acute findings within the abdomen or pelvis. 2.  Stable appearance of enlarged upper abdominal lymph nodes. 3.  Fibroid uterus.  Original Report Authenticated By: Cassandra Burns, M.D.   ASSESSMENT AND PLAN:    1. Iron-deficiency anemia. Most slightly due to menometrorrhagia.  Her iron today again low and therefore she will be arranged for IV Ferahem within the next week. I strongly reccommended to her to talk with  her PCP to see whether oral contraceptives appropriate to decrease her menometrorrahgia.   She had no family history or personal history of  thrombosis.   I also advised her to stat oral iron sup in the forms of either SlowFe or NuIron.  2. Reportedly gastrointestinal  bleeding.  Negative work up with Dr. Elnoria Burns. 3. Dysmenorrhagia.  I discussed with her to talk with her primary care physician to see whether she is a candidate for oral contraceptives to decrease her risk of further iron blood loss. 4. Diabetes mellitus, type II:  she is on glipizide, metformin per primary care physician. 5. Hypertension.  The patient is on amlodipine and losartan per primary care physician.  Her pressure is still elevated and I advised her to talk with her primary care physician for further titration as appropriate.  6. Stable abdominal peripancreatic adenopathy without pancreatic mass.   Stable from CT from 50/2012 to now.  This is a difficult area to biopsy percutaneously.  It may require  I recommended next CT in one year but sooner if she has B symptoms, significantly worsened abdominal pain.  7. Follow up:  Lab only in 2 months, 4months;  Lab/RV with me in 6 months.

## 2011-06-11 ENCOUNTER — Encounter (HOSPITAL_COMMUNITY): Payer: Medicare Other

## 2011-06-13 ENCOUNTER — Encounter (HOSPITAL_COMMUNITY)
Admission: RE | Admit: 2011-06-13 | Discharge: 2011-06-13 | Disposition: A | Discharge status: Home / Self Care | Payer: Medicare Other | Source: Ambulatory Visit | Attending: Family Medicine | Admitting: Family Medicine

## 2011-06-13 NOTE — Progress Notes (Signed)
Walk test done, increased distance by 53 %.  Gait steady, vital signs stable.

## 2011-06-14 ENCOUNTER — Other Ambulatory Visit: Payer: Self-pay | Admitting: Oncology

## 2011-06-18 ENCOUNTER — Encounter (HOSPITAL_COMMUNITY)
Admission: RE | Admit: 2011-06-18 | Discharge: 2011-06-18 | Disposition: A | Discharge status: Home / Self Care | Payer: Medicare Other | Source: Ambulatory Visit | Attending: Family Medicine | Admitting: Family Medicine

## 2011-06-20 ENCOUNTER — Encounter (HOSPITAL_COMMUNITY): Payer: Medicare Other

## 2011-06-25 ENCOUNTER — Encounter (HOSPITAL_COMMUNITY)
Admission: RE | Admit: 2011-06-25 | Discharge: 2011-06-25 | Disposition: A | Discharge status: Home / Self Care | Payer: Medicare Other | Source: Ambulatory Visit | Attending: Family Medicine | Admitting: Family Medicine

## 2011-06-27 ENCOUNTER — Encounter (HOSPITAL_COMMUNITY)
Admission: RE | Admit: 2011-06-27 | Discharge: 2011-06-27 | Disposition: A | Discharge status: Home / Self Care | Payer: Medicare Other | Source: Ambulatory Visit | Attending: Family Medicine | Admitting: Family Medicine

## 2011-07-02 ENCOUNTER — Encounter (HOSPITAL_COMMUNITY): Payer: Medicare Other | Attending: Family Medicine

## 2011-07-02 DIAGNOSIS — I10 Essential (primary) hypertension: Secondary | ICD-10-CM | POA: Insufficient documentation

## 2011-07-02 DIAGNOSIS — Z5189 Encounter for other specified aftercare: Secondary | ICD-10-CM | POA: Insufficient documentation

## 2011-07-02 DIAGNOSIS — E669 Obesity, unspecified: Secondary | ICD-10-CM | POA: Insufficient documentation

## 2011-07-02 DIAGNOSIS — J45909 Unspecified asthma, uncomplicated: Secondary | ICD-10-CM | POA: Insufficient documentation

## 2011-07-02 DIAGNOSIS — G4730 Sleep apnea, unspecified: Secondary | ICD-10-CM | POA: Insufficient documentation

## 2011-07-02 DIAGNOSIS — E7800 Pure hypercholesterolemia, unspecified: Secondary | ICD-10-CM | POA: Insufficient documentation

## 2011-07-02 DIAGNOSIS — E119 Type 2 diabetes mellitus without complications: Secondary | ICD-10-CM | POA: Insufficient documentation

## 2011-07-04 ENCOUNTER — Encounter (HOSPITAL_COMMUNITY): Payer: Medicare Other

## 2011-07-09 ENCOUNTER — Encounter (HOSPITAL_COMMUNITY): Payer: Medicare Other

## 2011-07-11 ENCOUNTER — Encounter (HOSPITAL_COMMUNITY): Payer: Medicare Other

## 2011-07-16 ENCOUNTER — Encounter (HOSPITAL_COMMUNITY): Payer: Medicare Other

## 2011-07-18 ENCOUNTER — Encounter (HOSPITAL_COMMUNITY): Payer: Medicare Other

## 2011-07-23 ENCOUNTER — Encounter (HOSPITAL_COMMUNITY): Payer: Medicare Other

## 2011-07-25 ENCOUNTER — Encounter (HOSPITAL_COMMUNITY): Payer: Medicare Other

## 2011-07-30 ENCOUNTER — Encounter (HOSPITAL_COMMUNITY): Payer: Medicare Other

## 2011-07-30 DIAGNOSIS — Z5189 Encounter for other specified aftercare: Secondary | ICD-10-CM | POA: Insufficient documentation

## 2011-07-30 DIAGNOSIS — E119 Type 2 diabetes mellitus without complications: Secondary | ICD-10-CM | POA: Insufficient documentation

## 2011-07-30 DIAGNOSIS — E669 Obesity, unspecified: Secondary | ICD-10-CM | POA: Insufficient documentation

## 2011-07-30 DIAGNOSIS — J45909 Unspecified asthma, uncomplicated: Secondary | ICD-10-CM | POA: Insufficient documentation

## 2011-07-30 DIAGNOSIS — I10 Essential (primary) hypertension: Secondary | ICD-10-CM | POA: Insufficient documentation

## 2011-07-30 DIAGNOSIS — G4730 Sleep apnea, unspecified: Secondary | ICD-10-CM | POA: Insufficient documentation

## 2011-07-30 DIAGNOSIS — E7800 Pure hypercholesterolemia, unspecified: Secondary | ICD-10-CM | POA: Insufficient documentation

## 2011-08-01 ENCOUNTER — Encounter (HOSPITAL_COMMUNITY): Payer: Medicare Other

## 2011-08-06 ENCOUNTER — Encounter (HOSPITAL_COMMUNITY): Payer: Medicare Other

## 2011-08-08 ENCOUNTER — Encounter (HOSPITAL_COMMUNITY): Payer: Medicare Other

## 2011-08-08 ENCOUNTER — Other Ambulatory Visit: Payer: Medicare Other | Admitting: Lab

## 2011-08-13 ENCOUNTER — Encounter (HOSPITAL_COMMUNITY): Payer: Medicare Other

## 2011-08-15 ENCOUNTER — Encounter (HOSPITAL_COMMUNITY): Payer: Medicare Other

## 2011-08-20 ENCOUNTER — Encounter (HOSPITAL_COMMUNITY): Payer: Medicare Other

## 2011-08-21 ENCOUNTER — Telehealth (HOSPITAL_COMMUNITY): Payer: Self-pay | Admitting: *Deleted

## 2011-08-22 ENCOUNTER — Encounter (HOSPITAL_COMMUNITY): Payer: Medicare Other

## 2011-08-27 ENCOUNTER — Encounter (HOSPITAL_COMMUNITY): Payer: Medicare Other

## 2011-08-29 ENCOUNTER — Encounter (HOSPITAL_COMMUNITY)
Admission: RE | Admit: 2011-08-29 | Discharge: 2011-08-29 | Disposition: A | Discharge status: Home / Self Care | Payer: Medicare Other | Source: Ambulatory Visit | Attending: Family Medicine | Admitting: Family Medicine

## 2011-09-03 ENCOUNTER — Encounter (HOSPITAL_COMMUNITY): Payer: Medicare Other

## 2011-09-05 ENCOUNTER — Encounter (HOSPITAL_COMMUNITY): Payer: Medicare Other

## 2011-09-10 ENCOUNTER — Encounter (HOSPITAL_COMMUNITY): Payer: Medicare Other

## 2011-09-12 ENCOUNTER — Encounter (HOSPITAL_COMMUNITY): Payer: Medicare Other

## 2011-09-17 ENCOUNTER — Encounter (HOSPITAL_COMMUNITY): Payer: Medicare Other

## 2011-09-19 ENCOUNTER — Encounter (HOSPITAL_COMMUNITY): Payer: Medicare Other

## 2011-09-19 ENCOUNTER — Emergency Department (INDEPENDENT_AMBULATORY_CARE_PROVIDER_SITE_OTHER)
Admission: EM | Admit: 2011-09-19 | Discharge: 2011-09-19 | Disposition: A | Discharge status: Home Health | Payer: Medicare Other | Source: Home / Self Care | Attending: Family Medicine | Admitting: Family Medicine

## 2011-09-19 ENCOUNTER — Encounter (HOSPITAL_COMMUNITY): Payer: Self-pay | Admitting: *Deleted

## 2011-09-19 DIAGNOSIS — L0390 Cellulitis, unspecified: Secondary | ICD-10-CM

## 2011-09-19 DIAGNOSIS — L0291 Cutaneous abscess, unspecified: Secondary | ICD-10-CM

## 2011-09-19 MED ORDER — DOXYCYCLINE HYCLATE 100 MG PO CAPS: 100.0000 mg | ORAL_CAPSULE | Freq: Two times a day (BID) | ORAL | Status: AC

## 2011-09-19 NOTE — Discharge Instructions (Signed)
Apply warm compresses twice daily x 15 min. Follow up in 48 hrs if in need of drainageAbscess An abscess (boil or furuncle) is an infected area that contains a collection of pus.  SYMPTOMS Signs and symptoms of an abscess include pain, tenderness, redness, or hardness. You may feel a moveable soft area under your skin. An abscess can occur anywhere in the body.  TREATMENT  A surgical cut (incision) may be made over your abscess to drain the pus. Gauze may be packed into the space or a drain may be looped through the abscess cavity (pocket). This provides a drain that will allow the cavity to heal from the inside outwards. The abscess may be painful for a few days, but should feel much better if it was drained.  Your abscess, if seen early, may not have localized and may not have been drained. If not, another appointment may be required if it does not get better on its own or with medications. HOME CARE INSTRUCTIONS   Only take over-the-counter or prescription medicines for pain, discomfort, or fever as directed by your caregiver.   Take your antibiotics as directed if they were prescribed. Finish them even if you start to feel better.   Keep the skin and clothes clean around your abscess.   If the abscess was drained, you will need to use gauze dressing to collect any draining pus. Dressings will typically need to be changed 3 or more times a day.   The infection may spread by skin contact with others. Avoid skin contact as much as possible.   Practice good hygiene. This includes regular hand washing, cover any draining skin lesions, and do not share personal care items.   If you participate in sports, do not share athletic equipment, towels, whirlpools, or personal care items. Shower after every practice or tournament.   If a draining area cannot be adequately covered:   Do not participate in sports.   Children should not participate in day care until the wound has healed or drainage  stops.   If your caregiver has given you a follow-up appointment, it is very important to keep that appointment. Not keeping the appointment could result in a much worse infection, chronic or permanent injury, pain, and disability. If there is any problem keeping the appointment, you must call back to this facility for assistance.  SEEK MEDICAL CARE IF:   You develop increased pain, swelling, redness, drainage, or bleeding in the wound site.   You develop signs of generalized infection including muscle aches, chills, fever, or a general ill feeling.   You have an oral temperature above 102 F (38.9 C).  MAKE SURE YOU:   Understand these instructions.   Will watch your condition.   Will get help right away if you are not doing well or get worse.  Document Released: 04/24/2005 Document Revised: 03/27/2011 Document Reviewed: 02/16/2008 [facility] Patient Information 2012 [place], [place].

## 2011-09-19 NOTE — ED Provider Notes (Signed)
History     CSN: [id number]  Arrival date & time 09/19/11  4098   First MD Initiated Contact with Patient 09/19/11 [phone]      Chief Complaint  Patient presents with  . Recurrent Skin Infections    (Consider location/radiation/quality/duration/timing/severity/associated sxs/prior treatment) HPI Comments: The patient reports having a boil on her right buttock and right labia for several days. Hx of similar episodes. States she has psoriasis and has scratched areas that then become infected. No treatment pta  The history is provided by the patient.    Past Medical History  Diagnosis Date  . Diabetes mellitus   . Hypertension   . Asthma   . Iron deficiency anemia due to chronic blood loss   . Menometrorrhagia     History reviewed. No pertinent past surgical history.  History reviewed. No pertinent family history.  History  Substance Use Topics  . Smoking status: Never Smoker   . Smokeless tobacco: Not on file  . Alcohol Use: No    OB History    Grav Para Term Preterm Abortions TAB SAB Ect Mult Living                  Review of Systems  Constitutional: Negative.   HENT: Negative.   Respiratory: Negative.   Cardiovascular: Negative.   Gastrointestinal: Negative.     Allergies  Review of patient's allergies indicates no known allergies.  Home Medications   Current Outpatient Rx  Name Route Sig Dispense Refill  . AMLODIPINE BESYLATE 5 MG PO TABS Oral Take 5 mg by mouth 2 (two) times daily.      [name] DOXYCYCLINE HYCLATE 100 MG PO CAPS Oral Take 1 capsule (100 mg total) by mouth 2 (two) times daily. 20 capsule 0  . GLIPIZIDE 5 MG PO TABS Oral Take 5 mg by mouth 2 (two) times daily before a meal.      . LORATADINE 10 MG PO TABS Oral Take 10 mg by mouth daily.      [name] LOSARTAN POTASSIUM 100 MG PO TABS Oral Take 100 mg by mouth daily.      [name] METFORMIN HCL 500 MG PO TABS Oral Take 500 mg by mouth.        BP 140/72  Pulse 86  Temp(Src) 98 F (36.7 C) (Oral)  Resp  16  SpO2 100%  LMP 09/11/2011  Physical Exam  Nursing note and vitals reviewed. Constitutional: She appears well-developed and well-nourished. No distress.  HENT:  Head: Normocephalic and atraumatic.  Cardiovascular: Normal rate and regular rhythm.   Pulmonary/Chest: Effort normal and breath sounds normal.  Skin: Skin is warm and dry.       eval of the buttocks and external vaginal area with female nursing personal assisting reveals 2 areas of psoriasis that formed small abscesses one on the buttock and the other on the right labia. No need to I&D at this point.     ED Course  Procedures (including critical care time)  Labs Reviewed - No data to display No results found.   1. Abscess       MDM          Randa Spike, MD 09/19/11 1014

## 2011-09-19 NOTE — ED Notes (Signed)
Pt is here with complaints of suspected abcess to right inner thigh, vagina and buttocks.  Also complaining of multiple generalized blisters.  Pt has history of psoriasis.

## 2011-09-24 ENCOUNTER — Encounter (HOSPITAL_COMMUNITY): Payer: Medicare Other

## 2011-09-26 ENCOUNTER — Encounter (HOSPITAL_COMMUNITY): Payer: Medicare Other

## 2011-10-01 ENCOUNTER — Encounter (HOSPITAL_COMMUNITY): Payer: Medicare Other

## 2011-10-02 ENCOUNTER — Telehealth: Payer: Self-pay | Admitting: Oncology

## 2011-10-02 NOTE — Telephone Encounter (Signed)
pt daughter called and r/s lab appt time on 03/08 to 12noon

## 2011-10-03 ENCOUNTER — Encounter (HOSPITAL_COMMUNITY): Payer: Medicare Other

## 2011-10-04 ENCOUNTER — Other Ambulatory Visit (HOSPITAL_BASED_OUTPATIENT_CLINIC_OR_DEPARTMENT_OTHER): Payer: Medicare Other | Admitting: Lab

## 2011-10-04 ENCOUNTER — Other Ambulatory Visit: Payer: Medicare Other | Admitting: Lab

## 2011-10-04 DIAGNOSIS — D649 Anemia, unspecified: Secondary | ICD-10-CM

## 2011-10-04 LAB — CBC WITH DIFFERENTIAL/PLATELET
EOS%: 1.5 % (ref 0.0–7.0)
MCH: 18.1 pg — ABNORMAL LOW (ref 25.1–34.0)
MCV: 59.9 fL — ABNORMAL LOW (ref 79.5–101.0)
MONO%: 9.1 % (ref 0.0–14.0)
NEUT#: 3.8 10*3/uL (ref 1.5–6.5)
RBC: 4.73 10*6/uL (ref 3.70–5.45)
RDW: 21.8 % — ABNORMAL HIGH (ref 11.2–14.5)

## 2011-10-04 LAB — FERRITIN: Ferritin: 5 ng/mL — ABNORMAL LOW (ref 10–291)

## 2011-10-06 ENCOUNTER — Other Ambulatory Visit: Payer: Self-pay | Admitting: Oncology

## 2011-10-06 DIAGNOSIS — D500 Iron deficiency anemia secondary to blood loss (chronic): Secondary | ICD-10-CM

## 2011-10-07 ENCOUNTER — Telehealth: Payer: Self-pay | Admitting: *Deleted

## 2011-10-07 ENCOUNTER — Telehealth: Payer: Self-pay | Admitting: Oncology

## 2011-10-07 ENCOUNTER — Other Ambulatory Visit: Payer: Self-pay | Admitting: *Deleted

## 2011-10-07 NOTE — Telephone Encounter (Signed)
called pts home lm with daughter to call us for irn tx  appt on 03/24

## 2011-10-07 NOTE — Telephone Encounter (Signed)
Called pt to inform her of Ferritin level low and Dr. Gaylyn Rong ordered IV iron w/i next week.  Informed her to expect call from scheduling to set up appt. She verbalized understanding.

## 2011-10-07 NOTE — Telephone Encounter (Signed)
Message copied by Wende Mott on Mon Oct 07, 2011 10:09 AM ------      Message from: Jethro Bolus T      Created: Sun Oct 06, 2011  4:23 PM       Please call patient and let her know that a scheduler will call her with an appointment for IV iron within 1 wk.  Thankx.

## 2011-10-08 ENCOUNTER — Encounter (HOSPITAL_COMMUNITY): Payer: Medicare Other

## 2011-10-09 ENCOUNTER — Telehealth: Payer: Self-pay | Admitting: Oncology

## 2011-10-09 NOTE — Telephone Encounter (Signed)
Called pt and informed her of appt on 03/18 and may

## 2011-10-10 ENCOUNTER — Other Ambulatory Visit: Payer: Self-pay | Admitting: Oncology

## 2011-10-10 ENCOUNTER — Encounter (HOSPITAL_COMMUNITY): Payer: Medicare Other

## 2011-10-14 ENCOUNTER — Ambulatory Visit (HOSPITAL_BASED_OUTPATIENT_CLINIC_OR_DEPARTMENT_OTHER): Payer: Medicare Other

## 2011-10-14 VITALS — BP 121/67 | HR 82 | Temp 97.3°F

## 2011-10-14 DIAGNOSIS — D500 Iron deficiency anemia secondary to blood loss (chronic): Secondary | ICD-10-CM

## 2011-10-14 DIAGNOSIS — D509 Iron deficiency anemia, unspecified: Secondary | ICD-10-CM

## 2011-10-14 MED ORDER — SODIUM CHLORIDE 0.9 % IV SOLN
1020.0000 mg | Freq: Once | INTRAVENOUS | Status: AC
  Administered 2011-10-14: 1020 mg via INTRAVENOUS
  Filled 2011-10-14: qty 34

## 2011-10-14 NOTE — Patient Instructions (Signed)
Pt observed x after fereheme. Pt denied any pain or discomfort. Pt discharged ambulatory. Pt to call with concerns.

## 2011-10-15 ENCOUNTER — Encounter (HOSPITAL_COMMUNITY): Payer: Medicare Other

## 2011-10-17 ENCOUNTER — Encounter (HOSPITAL_COMMUNITY): Payer: Medicare Other

## 2011-10-22 ENCOUNTER — Encounter (HOSPITAL_COMMUNITY): Payer: Medicare Other

## 2011-10-24 ENCOUNTER — Encounter (HOSPITAL_COMMUNITY): Payer: Medicare Other

## 2011-10-29 ENCOUNTER — Encounter (HOSPITAL_COMMUNITY): Payer: Medicare Other

## 2011-10-29 ENCOUNTER — Encounter: Payer: Self-pay | Admitting: Internal Medicine

## 2011-10-29 NOTE — Progress Notes (Signed)
Dr Juanda Chance has received records from [facility] on patient. She has reviewed these records and has declined to accept patient into her practice at this time.

## 2011-10-31 ENCOUNTER — Encounter (HOSPITAL_COMMUNITY): Payer: Medicare Other

## 2011-11-05 ENCOUNTER — Encounter (HOSPITAL_COMMUNITY): Payer: Medicare Other

## 2011-11-07 ENCOUNTER — Emergency Department (INDEPENDENT_AMBULATORY_CARE_PROVIDER_SITE_OTHER)
Admission: EM | Admit: 2011-11-07 | Discharge: 2011-11-07 | Disposition: A | Discharge status: Nursing Home (Includes ICF) | Payer: Medicare Other | Source: Home / Self Care | Attending: Emergency Medicine | Admitting: Emergency Medicine

## 2011-11-07 ENCOUNTER — Emergency Department (HOSPITAL_COMMUNITY): Payer: Medicare Other

## 2011-11-07 ENCOUNTER — Encounter (HOSPITAL_COMMUNITY): Payer: Self-pay | Admitting: Cardiology

## 2011-11-07 ENCOUNTER — Emergency Department (INDEPENDENT_AMBULATORY_CARE_PROVIDER_SITE_OTHER): Payer: Medicare Other

## 2011-11-07 ENCOUNTER — Inpatient Hospital Stay (HOSPITAL_COMMUNITY)
Admission: EM | Admit: 2011-11-07 | Discharge: 2011-11-09 | DRG: 194 | Disposition: A | Discharge status: Home / Self Care | Payer: Medicare Other | Source: Ambulatory Visit | Attending: Internal Medicine | Admitting: Internal Medicine

## 2011-11-07 ENCOUNTER — Encounter (HOSPITAL_COMMUNITY): Payer: Self-pay | Admitting: Nurse Practitioner

## 2011-11-07 ENCOUNTER — Encounter (HOSPITAL_COMMUNITY): Payer: Medicare Other

## 2011-11-07 DIAGNOSIS — I10 Essential (primary) hypertension: Secondary | ICD-10-CM

## 2011-11-07 DIAGNOSIS — Z79899 Other long term (current) drug therapy: Secondary | ICD-10-CM

## 2011-11-07 DIAGNOSIS — J189 Pneumonia, unspecified organism: Principal | ICD-10-CM

## 2011-11-07 DIAGNOSIS — R0789 Other chest pain: Secondary | ICD-10-CM

## 2011-11-07 DIAGNOSIS — R071 Chest pain on breathing: Secondary | ICD-10-CM

## 2011-11-07 DIAGNOSIS — N921 Excessive and frequent menstruation with irregular cycle: Secondary | ICD-10-CM

## 2011-11-07 DIAGNOSIS — D500 Iron deficiency anemia secondary to blood loss (chronic): Secondary | ICD-10-CM | POA: Diagnosis present

## 2011-11-07 DIAGNOSIS — R599 Enlarged lymph nodes, unspecified: Secondary | ICD-10-CM | POA: Diagnosis present

## 2011-11-07 DIAGNOSIS — R51 Headache: Secondary | ICD-10-CM

## 2011-11-07 DIAGNOSIS — R1110 Vomiting, unspecified: Secondary | ICD-10-CM

## 2011-11-07 DIAGNOSIS — R112 Nausea with vomiting, unspecified: Secondary | ICD-10-CM | POA: Diagnosis present

## 2011-11-07 DIAGNOSIS — Z6841 Body Mass Index (BMI) 40.0 and over, adult: Secondary | ICD-10-CM

## 2011-11-07 DIAGNOSIS — E119 Type 2 diabetes mellitus without complications: Secondary | ICD-10-CM | POA: Diagnosis present

## 2011-11-07 DIAGNOSIS — J45909 Unspecified asthma, uncomplicated: Secondary | ICD-10-CM | POA: Diagnosis present

## 2011-11-07 DIAGNOSIS — G4733 Obstructive sleep apnea (adult) (pediatric): Secondary | ICD-10-CM | POA: Diagnosis present

## 2011-11-07 HISTORY — DX: Psoriasis, unspecified: L40.9

## 2011-11-07 LAB — URINE MICROSCOPIC-ADD ON

## 2011-11-07 LAB — URINALYSIS, ROUTINE W REFLEX MICROSCOPIC
Glucose, UA: NEGATIVE mg/dL
Ketones, ur: 15 mg/dL — AB
Leukocytes, UA: NEGATIVE
Specific Gravity, Urine: 1.02 (ref 1.005–1.030)
pH: 5.5 (ref 5.0–8.0)

## 2011-11-07 LAB — COMPREHENSIVE METABOLIC PANEL
AST: 18 U/L (ref 0–37)
Albumin: 4.1 g/dL (ref 3.5–5.2)
Alkaline Phosphatase: 100 U/L (ref 39–117)
Chloride: 102 mEq/L (ref 96–112)
Creatinine, Ser: 0.7 mg/dL (ref 0.50–1.10)
Potassium: 3.7 mEq/L (ref 3.5–5.1)
Total Bilirubin: 0.3 mg/dL (ref 0.3–1.2)

## 2011-11-07 LAB — DIFFERENTIAL
Basophils Absolute: 0 10*3/uL (ref 0.0–0.1)
Lymphocytes Relative: 22 % (ref 12–46)
Monocytes Relative: 6 % (ref 3–12)

## 2011-11-07 LAB — GLUCOSE, CAPILLARY: Glucose-Capillary: 154 mg/dL — ABNORMAL HIGH (ref 70–99)

## 2011-11-07 LAB — CBC
Platelets: 274 10*3/uL (ref 150–400)
RDW: 27 % — ABNORMAL HIGH (ref 11.5–15.5)
WBC: 4.6 10*3/uL (ref 4.0–10.5)

## 2011-11-07 LAB — D-DIMER, QUANTITATIVE: D-Dimer, Quant: 0.5 ug/mL-FEU — ABNORMAL HIGH (ref 0.00–0.48)

## 2011-11-07 MED ORDER — SODIUM CHLORIDE 0.9 % IV BOLUS (SEPSIS)
1000.0000 mL | Freq: Once | INTRAVENOUS | Status: AC
  Administered 2011-11-07: 1000 mL via INTRAVENOUS

## 2011-11-07 MED ORDER — IBUPROFEN 800 MG PO TABS
ORAL_TABLET | ORAL | Status: AC
  Filled 2011-11-07: qty 1

## 2011-11-07 MED ORDER — ONDANSETRON HCL 4 MG/2ML IJ SOLN
4.0000 mg | INTRAMUSCULAR | Status: AC | PRN
  Administered 2011-11-07 (×2): 4 mg via INTRAVENOUS
  Filled 2011-11-07 (×2): qty 2

## 2011-11-07 MED ORDER — LORAZEPAM 2 MG/ML IJ SOLN
1.0000 mg | Freq: Once | INTRAMUSCULAR | Status: AC
  Administered 2011-11-07: 1 mg via INTRAMUSCULAR

## 2011-11-07 MED ORDER — ONDANSETRON HCL 4 MG/2ML IJ SOLN
4.0000 mg | INTRAMUSCULAR | Status: DC | PRN
  Administered 2011-11-07: 4 mg via INTRAVENOUS
  Filled 2011-11-07: qty 2

## 2011-11-07 MED ORDER — LORAZEPAM 2 MG/ML IJ SOLN
INTRAMUSCULAR | Status: AC
  Filled 2011-11-07: qty 1

## 2011-11-07 MED ORDER — AMLODIPINE BESYLATE 5 MG PO TABS
5.0000 mg | ORAL_TABLET | Freq: Two times a day (BID) | ORAL | Status: DC
  Administered 2011-11-07 – 2011-11-09 (×4): 5 mg via ORAL
  Filled 2011-11-07 (×6): qty 1

## 2011-11-07 MED ORDER — AZITHROMYCIN 250 MG PO TABS
500.0000 mg | ORAL_TABLET | Freq: Once | ORAL | Status: AC
  Administered 2011-11-07: 500 mg via ORAL
  Filled 2011-11-07: qty 2

## 2011-11-07 MED ORDER — IBUPROFEN 800 MG PO TABS
800.0000 mg | ORAL_TABLET | Freq: Once | ORAL | Status: AC
  Administered 2011-11-07: 800 mg via ORAL

## 2011-11-07 MED ORDER — DEXTROSE 5 % IV SOLN
1.0000 g | Freq: Once | INTRAVENOUS | Status: AC
  Administered 2011-11-07: 1 g via INTRAVENOUS
  Filled 2011-11-07: qty 10

## 2011-11-07 MED ORDER — AMLODIPINE BESYLATE 5 MG PO TABS
5.0000 mg | ORAL_TABLET | ORAL | Status: AC
  Administered 2011-11-07: 5 mg via ORAL
  Filled 2011-11-07: qty 1

## 2011-11-07 MED ORDER — HYDROMORPHONE HCL PF 1 MG/ML IJ SOLN
1.0000 mg | Freq: Once | INTRAMUSCULAR | Status: AC
  Administered 2011-11-07: 1 mg via INTRAVENOUS
  Filled 2011-11-07: qty 1

## 2011-11-07 NOTE — ED Notes (Signed)
Patient returned from radiology.  Placed back on ED monitors.  Denies pain at present.  Assisted to bedside and voided 300 ml of yellow urine.

## 2011-11-07 NOTE — ED Provider Notes (Signed)
History     CSN: [id number]  Arrival date & time 11/07/11  1107   First MD Initiated Contact with Patient 11/07/11 1226      Chief Complaint  Patient presents with  . Abdominal Pain    (Consider location/radiation/quality/duration/timing/severity/associated sxs/prior treatment) HPI Comments: Patient with a history of diabetes, hypertension, and asthma presents emergency department from urgent care Center.  Patient was evaluated for left upper quadrant abdominal pain and high blood pressure.  She was transferred because the previous physician had concern for hypertensive urgency.  At that time patient had a blood pressure of 179/107 without focal neuro deficits. Chest x-ray was done showing no pneumothorax or pneumonia.  It was thought that the patient's chest pain was chest wall pain from an asthma exacerbation.  Patient states that she currently has severe 10 out of 10 left upper quadrant pain and is crying.  Associated symptoms include headache  Patient denies any change in vision, cough, arm or leg weakness, rash, fever, ataxia, palpitations, syncope, wheezing.  The history is provided by the patient.    Past Medical History  Diagnosis Date  . Diabetes mellitus   . Hypertension   . Asthma   . Iron deficiency anemia due to chronic blood loss   . Menometrorrhagia   . Psoriasis     History reviewed. No pertinent past surgical history.  History reviewed. No pertinent family history.  History  Substance Use Topics  . Smoking status: Never Smoker   . Smokeless tobacco: Not on file  . Alcohol Use: No    OB History    Grav Para Term Preterm Abortions TAB SAB Ect Mult Living                  Review of Systems  Constitutional: Negative for fever, chills and appetite change.  HENT: Negative for congestion.   Eyes: Negative for visual disturbance.  Respiratory: Negative for shortness of breath.   Cardiovascular: Positive for chest pain. Negative for palpitations and leg  swelling.  Gastrointestinal: Positive for abdominal pain. Negative for nausea, vomiting, diarrhea, constipation, blood in stool, abdominal distention, anal bleeding and rectal pain.  Genitourinary: Negative for dysuria, urgency and frequency.  Neurological: Positive for headaches. Negative for dizziness, syncope, weakness, light-headedness and numbness.  Psychiatric/Behavioral: Negative for confusion.  All other systems reviewed and are negative.    Allergies  Review of patient's allergies indicates no known allergies.  Home Medications   Current Outpatient Rx  Name Route Sig Dispense Refill  . ALBUTEROL SULFATE (2.5 MG/3ML) 0.083% IN NEBU Nebulization Take 2.5 mg by nebulization every 6 (six) hours as needed. For shortness of breath    . AMLODIPINE BESYLATE 5 MG PO TABS Oral Take 5 mg by mouth 2 (two) times daily.      [name] LORATADINE 10 MG PO TABS Oral Take 10 mg by mouth daily.      [name] LOSARTAN POTASSIUM 100 MG PO TABS Oral Take 100 mg by mouth daily.      [name] BIOFREEZE EX Apply externally Apply 1 application topically as needed. For stomach for pain.    [name] METFORMIN HCL 500 MG PO TABS Oral Take 500 mg by mouth 2 (two) times daily with a meal.       BP 211/129  Pulse 104  Temp(Src) 97.7 F (36.5 C) (Oral)  Resp 18  Ht 4\' 11"  (1.499 m)  Wt 205 lb (92.987 kg)  BMI 41.40 kg/m2  SpO2 99%  LMP 10/12/2011  Physical Exam  Nursing note and vitals reviewed. Constitutional: She is oriented to person, place, and time. She appears well-developed and well-nourished. No distress.  HENT:  Head: Normocephalic and atraumatic.  Eyes: Conjunctivae and EOM are normal. Pupils are equal, round, and reactive to light. No scleral icterus.  Neck: Normal range of motion and full passive range of motion without pain. Neck supple. No JVD present. Carotid bruit is not present. No rigidity. No Brudzinski's sign noted.  Cardiovascular: Normal rate, regular rhythm, normal heart sounds and intact distal pulses.    Pulmonary/Chest: Effort normal and breath sounds normal. No respiratory distress. She has no wheezes. She has no rales.  Abdominal: There is tenderness.         Obsese, soft, normal bowel sounds, ttp luq  Musculoskeletal: Normal range of motion.  Lymphadenopathy:    She has no cervical adenopathy.  Neurological: She is alert and oriented to person, place, and time. She has normal strength. No cranial nerve deficit or sensory deficit. She displays a negative Romberg sign. Coordination and gait normal. GCS eye subscore is 4. GCS verbal subscore is 5. GCS motor subscore is 6.       A&O x3.  Able to follow commands. PERRL, EOMs, no vertical or bidirectional nystagmus. Shoulder shrug, facial muscles, tongue protrusion and swallow intact.  Motor strength 5/5 bilaterally including grip strength, triceps, hamstrings and ankle dorsiflexion. Light touch intact in all 4 distal limbs.  Intact finger to nose, shin to heel and rapid alternating movements. No ataxia or dysequilibrium.   Skin: Skin is warm and dry. No rash noted. She is not diaphoretic.  Psychiatric: She has a normal mood and affect. Her behavior is normal.    ED Course  Procedures (including critical care time)  Labs Reviewed  CBC - Abnormal; Notable for the following:    RBC 5.66 (*)    Hemoglobin 11.2 (*)    MCV 65.0 (*)    MCH 19.8 (*)    RDW 27.0 (*)    All other components within normal limits  COMPREHENSIVE METABOLIC PANEL - Abnormal; Notable for the following:    Glucose, Bld 109 (*)    Total Protein 9.0 (*)    All other components within normal limits  DIFFERENTIAL   Dg Chest 2 View  11/07/2011  *RADIOLOGY REPORT*  Clinical Data: Left lower chest pain, history of asthma  CHEST - 2 VIEW  Comparison: CT abdomen pelvis - 06/07/2011  Findings:  The lateral radiograph is obscured secondary to patient motion artifact.  Borderline enlarged cardiac silhouette and mediastinal contours.  Minimal bibasilar opacities favored to  represent atelectasis.  No focal airspace opacities.  No pleural effusion or pneumothorax. No acute osseous abnormalities.  IMPRESSION: Bibasilar opacities favored to represent atelectasis.  Original Report Authenticated By: Waynard Reeds, M.D.     No diagnosis found.    MDM  Pt care resumed by Dr. Jeraldine Loots. Labs & radiology pending.        Jaci Carrel, [place] 11/09/11 1625

## 2011-11-07 NOTE — ED Notes (Signed)
Pt sent here from [facility] for eval of LLQ abdominal pain and headache. Pt reporting pulled muscle several days ago. C/o LLQ pain, worse with palpation and movement. C/o headache since this morning. Denying any blurry vision.

## 2011-11-07 NOTE — H&P (Signed)
Cassandra Burns is an 46 y.o. female.   Chief Complaint: Nausea vomiting abdominal pain for 3 days HPI: This 46 year old morbidly obese female with history of diabetes type 2 currently on metformin presented with continuous cough for 4 days. This has led to some chest discomfort on pain which she attributed to the cough. She also started having nausea and vomiting today and has been unable to keep anything down. She has malaise some fever but no chills. Her cough is nonproductive initially but now some white sputum. Denied any hematemesis nor hemoptysis no melena no bright red blood per rectum. She has some abdominal pain associated with the nausea and vomiting but no diarrhea. She denies sick contacts nose recess or throat or rhinorrhea no symptoms. She has chronic anemia from excessive uterine bleeding from fibroids. She has been on metformin since her diagnosis of diabetes 2 years ago and does not believe her nausea vomiting is from metformin. High evaluation in the ED showed bilateral pneumonia only on the CT scan. She also has scattered lymphadenopathy in her upper abdomen of unknown significance. Patient is generally unwell with several nonspecific symptoms.  Past Medical History  Diagnosis Date  . Diabetes mellitus   . Hypertension   . Asthma   . Iron deficiency anemia due to chronic blood loss   . Menometrorrhagia   . Psoriasis     History reviewed. No pertinent past surgical history.  Family History  Problem Relation Age of Onset  . Diabetes type II Mother    Social History:  reports that she has never smoked. She does not have any smokeless tobacco history on file. She reports that she does not drink alcohol or use illicit drugs.  Allergies: No Known Allergies  Medications Prior to Admission  Medication Dose Route Frequency Provider Last Rate Last Dose  . amLODipine (NORVASC) tablet 5 mg  5 mg Oral BID Lisette Paz, PA-C   5 mg at 11/07/11 2149  . amLODipine (NORVASC) tablet  5 mg  5 mg Oral To Minor Glynn Octave, MD   5 mg at 11/07/11 1344  . azithromycin (ZITHROMAX) tablet 500 mg  500 mg Oral Once Joya Gaskins, MD   500 mg at 11/07/11 1909  . cefTRIAXone (ROCEPHIN) 1 g in dextrose 5 % 50 mL IVPB  1 g Intravenous Once Joya Gaskins, MD   1 g at 11/07/11 1908  . HYDROmorphone (DILAUDID) injection 1 mg  1 mg Intravenous Once Lisette Paz, PA-C   1 mg at 11/07/11 1251  . HYDROmorphone (DILAUDID) injection 1 mg  1 mg Intravenous Once Joya Gaskins, MD   1 mg at 11/07/11 2122  . ibuprofen (ADVIL,MOTRIN) tablet 800 mg  800 mg Oral Once Luiz Blare, MD   800 mg at 11/07/11 0925  . LORazepam (ATIVAN) injection 1 mg  1 mg Intramuscular Once Luiz Blare, MD   1 mg at 11/07/11 0925  . ondansetron (ZOFRAN) injection 4 mg  4 mg Intravenous PRN Lisette Paz, PA-C   4 mg at 11/07/11 1432  . ondansetron (ZOFRAN) injection 4 mg  4 mg Intravenous PRN Joya Gaskins, MD   4 mg at 11/07/11 2122  . sodium chloride 0.9 % bolus 1,000 mL  1,000 mL Intravenous Once Glynn Octave, MD   1,000 mL at 11/07/11 1427  . sodium chloride 0.9 % bolus 1,000 mL  1,000 mL Intravenous Once Joya Gaskins, MD   1,000 mL at 11/07/11 1854  Medications Prior to Admission  Medication Sig Dispense Refill  . albuterol (PROVENTIL) (2.5 MG/3ML) 0.083% nebulizer solution Take 2.5 mg by nebulization every 6 (six) hours as needed. For shortness of breath      . amLODipine (NORVASC) 5 MG tablet Take 5 mg by mouth 2 (two) times daily.        [name] loratadine (CLARITIN) 10 MG tablet Take 10 mg by mouth daily.        [name] losartan (COZAAR) 100 MG tablet Take 100 mg by mouth daily.        . metFORMIN (GLUCOPHAGE) 500 MG tablet Take 500 mg by mouth 2 (two) times daily with a meal.         Results for orders placed during the hospital encounter of 11/07/11 (from the past 48 hour(s))  CBC     Status: Abnormal   Collection Time   11/07/11 11:53 AM      Component Value Range Comment   WBC 4.6   4.0 - 10.5 (K/uL)    RBC 5.66 (*) 3.87 - 5.11 (MIL/uL)    Hemoglobin 11.2 (*) 12.0 - 15.0 (g/dL)    HCT 60.6  30.1 - 60.1 (%)    MCV 65.0 (*) 78.0 - 100.0 (fL)    MCH 19.8 (*) 26.0 - 34.0 (pg)    MCHC 30.4  30.0 - 36.0 (g/dL)    RDW 09.3 (*) 23.5 - 15.5 (%)    Platelets 274  150 - 400 (K/uL)   DIFFERENTIAL     Status: Normal   Collection Time   11/07/11 11:53 AM      Component Value Range Comment   Neutrophils Relative 70  43 - 77 (%)    Lymphocytes Relative 22  12 - 46 (%)    Monocytes Relative 6  3 - 12 (%)    Eosinophils Relative 1  0 - 5 (%)    Basophils Relative 1  0 - 1 (%)    Neutro Abs 3.3  1.7 - 7.7 (K/uL)    Lymphs Abs 1.0  0.7 - 4.0 (K/uL)    Monocytes Absolute 0.3  0.1 - 1.0 (K/uL)    Eosinophils Absolute 0.0  0.0 - 0.7 (K/uL)    Basophils Absolute 0.0  0.0 - 0.1 (K/uL)    RBC Morphology POLYCHROMASIA PRESENT     COMPREHENSIVE METABOLIC PANEL     Status: Abnormal   Collection Time   11/07/11 11:53 AM      Component Value Range Comment   Sodium 137  135 - 145 (mEq/L)    Potassium 3.7  3.5 - 5.1 (mEq/L)    Chloride 102  96 - 112 (mEq/L)    CO2 22  19 - 32 (mEq/L)    Glucose, Bld 109 (*) 70 - 99 (mg/dL)    BUN 9  6 - 23 (mg/dL)    Creatinine, Ser 5.73  0.50 - 1.10 (mg/dL)    Calcium 22.0  8.4 - 10.5 (mg/dL)    Total Protein 9.0 (*) 6.0 - 8.3 (g/dL)    Albumin 4.1  3.5 - 5.2 (g/dL)    AST 18  0 - 37 (U/L)    ALT 13  0 - 35 (U/L)    Alkaline Phosphatase 100  39 - 117 (U/L)    Total Bilirubin 0.3  0.3 - 1.2 (mg/dL)    GFR calc non Af Amer >90  >90 (mL/min)    GFR calc Af Amer >90  >90 (mL/min)   TROPONIN I  Status: Normal   Collection Time   11/07/11  1:28 PM      Component Value Range Comment   Troponin I <0.30  <0.30 (ng/mL)   D-DIMER, QUANTITATIVE     Status: Abnormal   Collection Time   11/07/11  1:28 PM      Component Value Range Comment   D-Dimer, Quant 0.50 (*) 0.00 - 0.48 (ug/mL-FEU)   URINALYSIS, ROUTINE W REFLEX MICROSCOPIC     Status: Abnormal    Collection Time   11/07/11  3:46 PM      Component Value Range Comment   Color, Urine YELLOW  YELLOW     APPearance CLOUDY (*) CLEAR     Specific Gravity, Urine 1.020  1.005 - 1.030     pH 5.5  5.0 - 8.0     Glucose, UA NEGATIVE  NEGATIVE (mg/dL)    Hgb urine dipstick TRACE (*) NEGATIVE     Bilirubin Urine NEGATIVE  NEGATIVE     Ketones, ur 15 (*) NEGATIVE (mg/dL)    Protein, ur 30 (*) NEGATIVE (mg/dL)    Urobilinogen, UA 1.0  0.0 - 1.0 (mg/dL)    Nitrite NEGATIVE  NEGATIVE     Leukocytes, UA NEGATIVE  NEGATIVE    URINE MICROSCOPIC-ADD ON     Status: Abnormal   Collection Time   11/07/11  3:46 PM      Component Value Range Comment   Squamous Epithelial / LPF MANY (*) RARE     WBC, UA 0-2  <3 (WBC/hpf)    RBC / HPF 0-2  <3 (RBC/hpf)    Bacteria, UA FEW (*) RARE     Urine-Other AMORPHOUS URATES/PHOSPHATES     GLUCOSE, CAPILLARY     Status: Abnormal   Collection Time   11/07/11  5:17 PM      Component Value Range Comment   Glucose-Capillary 154 (*) 70 - 99 (mg/dL)    Dg Chest 2 View  1/61/0960  *RADIOLOGY REPORT*  Clinical Data: Left lower chest pain, history of asthma  CHEST - 2 VIEW  Comparison: CT abdomen pelvis - 06/07/2011  Findings:  The lateral radiograph is obscured secondary to patient motion artifact.  Borderline enlarged cardiac silhouette and mediastinal contours.  Minimal bibasilar opacities favored to represent atelectasis.  No focal airspace opacities.  No pleural effusion or pneumothorax. No acute osseous abnormalities.  IMPRESSION: Bibasilar opacities favored to represent atelectasis.  Original Report Authenticated By: Waynard Reeds, M.D.   Ct Head Wo Contrast  11/07/2011  *RADIOLOGY REPORT*  Clinical Data: Headache  CT HEAD WITHOUT CONTRAST  Technique:  Contiguous axial images were obtained from the base of the skull through the vertex without contrast.  Comparison: None.  Findings: Examination is constrained by streak artifact due to the patient's body habitus.  No  evidence of parenchymal hemorrhage or extra-axial fluid collection. No mass lesion, mass effect, or midline shift.  No gross CT evidence of acute infarction.  Cerebral volume is age appropriate.  No ventriculomegaly.  The visualized paranasal sinuses are essentially clear. The mastoid air cells are unopacified.  No evidence of calvarial fracture.  IMPRESSION: Examination is constrained by streak artifact due to the patient's body habitus.  No gross intracranial abnormality is seen.  Original Report Authenticated By: Charline Bills, M.D.   Ct Angio Chest W/cm &/or Wo Cm  11/07/2011  *RADIOLOGY REPORT*  Clinical Data:  Chest pain, evaluate for PE.  Left upper quadrant abdominal pain.  CT ANGIOGRAPHY CHEST CT ABDOMEN AND  PELVIS WITH CONTRAST  Technique:  Multidetector CT imaging of the chest was performed using the standard protocol during bolus administration of intravenous contrast.  Multiplanar CT image reconstructions including MIPs were obtained to evaluate the vascular anatomy. Multidetector CT imaging of the abdomen and pelvis was performed using the standard protocol during bolus administration of intravenous contrast.  Contrast:  100 ml Omnipaque-300 IV  Comparison:  CT abdomen pelvis dated 06/07/2011.  CTA CHEST  Findings:  No evidence of pulmonary embolism.  Patchy opacities in the right upper lobe, lingula, and right lower lobe, suspicious for multifocal pneumonia.  No pleural effusion or pneumothorax.  Visualized thyroid is unremarkable.  Mild cardiomegaly.  No pericardial effusion.  Mild atherosclerotic calcifications of the aortic arch.  Mediastinal and bilateral lymph nodes, including: --12 mm short-axis high right paratracheal node (series 7/image 61) --10 mm short-axis prevascular node (series 7/image 92) --10 mm short axis precarinal node (series 7/image 94) --10 mm short-axis right hilar node (series 7/image 95) --12 mm short-axis left lateral node (series 7/image 111) --14 mm short-axis  subcarinal node (series 7/image 128)  Degenerative changes of the thoracic spine.   Review of the MIP images confirms the above findings.  IMPRESSION: No evidence of pulmonary embolism.  Patchy opacities in the right upper lobe, lingula, and right lower lobe, suspicious for multifocal pneumonia.  Mild cardiomegaly.  Mildly enlarged mediastinal and bilateral hilar lymph nodes, as described above.  CT ABDOMEN AND PELVIS  Findings: Liver, spleen, pancreas, and adrenal glands are within normal limits.  Gallbladder is unremarkable.  No intrahepatic or extrahepatic ductal dilatation.  Kidneys are within normal limits.  No hydronephrosis.  No evidence of bowel obstruction.  Normal appendix.  Atherosclerotic calcifications of the abdominal aorta and branch vessels.  No abdominopelvic ascites.  Enlarged upper abdominal lymph nodes, grossly unchanged, including: --1.9 cm short-axis peripancreatic node (series 13/image 18) --1.6 cm short-axis portacaval node (series 2/image 21) --1.7 cm short-axis splenic hilar node (series 13/image 23)  Additionally, there is a 11 mm short-axis left external iliac node (series 13/image 64).  Multiple uterine fibroids, some of which are partially calcified. Ovaries are unremarkable.  Bladder is within normal limits.  Mild degenerative changes of the lumbar spine.  Review of the MIP images confirms the above findings.  IMPRESSION: Stable upper abdominal lymphadenopathy, as described above.  Uterine fibroids.  Original Report Authenticated By: Charline Bills, M.D.   Ct Abdomen Pelvis W Contrast  11/07/2011  *RADIOLOGY REPORT*  Clinical Data:  Chest pain, evaluate for PE.  Left upper quadrant abdominal pain.  CT ANGIOGRAPHY CHEST CT ABDOMEN AND PELVIS WITH CONTRAST  Technique:  Multidetector CT imaging of the chest was performed using the standard protocol during bolus administration of intravenous contrast.  Multiplanar CT image reconstructions including MIPs were obtained to evaluate the  vascular anatomy. Multidetector CT imaging of the abdomen and pelvis was performed using the standard protocol during bolus administration of intravenous contrast.  Contrast:  100 ml Omnipaque-300 IV  Comparison:  CT abdomen pelvis dated 06/07/2011.  CTA CHEST  Findings:  No evidence of pulmonary embolism.  Patchy opacities in the right upper lobe, lingula, and right lower lobe, suspicious for multifocal pneumonia.  No pleural effusion or pneumothorax.  Visualized thyroid is unremarkable.  Mild cardiomegaly.  No pericardial effusion.  Mild atherosclerotic calcifications of the aortic arch.  Mediastinal and bilateral lymph nodes, including: --12 mm short-axis high right paratracheal node (series 7/image 61) --10 mm short-axis prevascular node (series 7/image 92) --10 mm  short axis precarinal node (series 7/image 94) --10 mm short-axis right hilar node (series 7/image 95) --12 mm short-axis left lateral node (series 7/image 111) --14 mm short-axis subcarinal node (series 7/image 128)  Degenerative changes of the thoracic spine.   Review of the MIP images confirms the above findings.  IMPRESSION: No evidence of pulmonary embolism.  Patchy opacities in the right upper lobe, lingula, and right lower lobe, suspicious for multifocal pneumonia.  Mild cardiomegaly.  Mildly enlarged mediastinal and bilateral hilar lymph nodes, as described above.  CT ABDOMEN AND PELVIS  Findings: Liver, spleen, pancreas, and adrenal glands are within normal limits.  Gallbladder is unremarkable.  No intrahepatic or extrahepatic ductal dilatation.  Kidneys are within normal limits.  No hydronephrosis.  No evidence of bowel obstruction.  Normal appendix.  Atherosclerotic calcifications of the abdominal aorta and branch vessels.  No abdominopelvic ascites.  Enlarged upper abdominal lymph nodes, grossly unchanged, including: --1.9 cm short-axis peripancreatic node (series 13/image 18) --1.6 cm short-axis portacaval node (series 2/image 21) --1.7  cm short-axis splenic hilar node (series 13/image 23)  Additionally, there is a 11 mm short-axis left external iliac node (series 13/image 64).  Multiple uterine fibroids, some of which are partially calcified. Ovaries are unremarkable.  Bladder is within normal limits.  Mild degenerative changes of the lumbar spine.  Review of the MIP images confirms the above findings.  IMPRESSION: Stable upper abdominal lymphadenopathy, as described above.  Uterine fibroids.  Original Report Authenticated By: Charline Bills, M.D.    Review of Systems  Constitutional: Positive for fever and malaise/fatigue.  HENT: Positive for sore throat.   Eyes: Negative.   Respiratory: Positive for cough, sputum production and shortness of breath. Negative for hemoptysis and wheezing.   Cardiovascular: Positive for chest pain. Negative for orthopnea, claudication, leg swelling and PND.  Gastrointestinal: Positive for nausea and vomiting. Negative for heartburn, diarrhea, constipation and blood in stool.  Genitourinary: Negative.   Musculoskeletal: Positive for myalgias.  Skin: Negative.   Neurological: Positive for weakness and headaches.  Endo/Heme/Allergies: Negative.     Blood pressure 161/76, pulse 79, temperature 97.7 F (36.5 C), temperature source Oral, resp. rate 18, height 4\' 11"  (1.499 m), weight 92.987 kg (205 lb), last menstrual period 10/12/2011, SpO2 100.00%. Physical Exam  Constitutional: She is oriented to person, place, and time. She appears well-developed and well-nourished.       Obese  HENT:  Head: Normocephalic and atraumatic.  Right Ear: External ear normal.  Left Ear: External ear normal.  Nose: Nose normal.  Mouth/Throat: Oropharynx is clear and moist.  Eyes: EOM are normal. Pupils are equal, round, and reactive to light.  Neck: Normal range of motion. Neck supple.  Cardiovascular: Normal rate, regular rhythm, normal heart sounds and intact distal pulses.   Respiratory: Effort normal. She  has decreased breath sounds in the right lower field. She has no wheezes. She has rhonchi. She has no rales. She exhibits tenderness.  GI: Soft. Bowel sounds are normal.  Musculoskeletal: Normal range of motion.  Neurological: She is alert and oriented to person, place, and time. She has normal reflexes.  Skin: Skin is warm and dry.  Psychiatric: She has a normal mood and affect. Her behavior is normal. Judgment and thought content normal.     Assessment/Plan Assessment this is a 46 year old morbidly obese female been admitted with what appears to be community-acquired pneumonia in a patient with history of asthma obstructive sleep apnea as well as diabetes. Plan 1 community-acquired pneumonia: Patient  will be admitted for IV antibiotics. Will start Rocephin Zithromax. Get blood cultures sputum cultures and put on some oxygen. Once she is able to eat by mouth would probably to transition her to oral antibiotics to complete at home she isince at her age she could be treated as an outpatient Plan #2 diabetes: Patient has had contrast media so we will hold the metformin and start her on sliding scale insulin here in the hospital. She's apparently been doing better with last A1c in the range of 5-6 according to her. We'll check a serial blood glucose a.c. and at bedtime. #3 hypertension: Continue with the home medications and watch closely. She had had a systolic blood pressure in the range of 200s when she arrived that can cause the nausea and vomiting also. #4 intractable nausea vomiting: Patient is unable to keep anything down. The defect shows a various including diabetic gastroparesis acute pneumonia viral illnesses as well as medication. It could also be secondary to hypertensive urgency at the time of admission. We will treat it symptomatically with some Zofran with or without Phenergan, IV fluids as well as close monitoring. I will keep him on clear liquids for now until she is able to eat. #5  morbid obesity: She ruminates and 5 nutrition counseling. Patient says she has been losing weight apparently. #6 obstructive sleep apnea: She uses CPAP at home which we will continue with in the hospital. [facility] 11/07/2011, 10:53 PM

## 2011-11-07 NOTE — ED Provider Notes (Signed)
Assumed care of patient to f/u on CT imaging Pneumonia noted Pt not hypoxic but with intractable vomiting unable to take PO meds Will admit D/w dr Mikeal Hawthorne to admit   Joya Gaskins, MD 11/07/11 2158

## 2011-11-07 NOTE — ED Provider Notes (Signed)
History     CSN: [id number]  Arrival date & time 11/07/11  7829   First MD Initiated Contact with Patient 11/07/11 0831      Chief Complaint  Patient presents with  . Abdominal Pain  . Neck Pain    (Consider location/radiation/quality/duration/timing/severity/associated sxs/prior treatment) HPI Comments: Patient is sharp, intermittent, nonradiating, left-sided lower rib pain starting a week ago. States it lasts seconds, and feels like a muscle spasm. States that the pain started after having a coughing fit while in bed.  Pain is worse with taking deep breath in, torso rotation, movement. No exertional, positional component. No rash. Reports nausea, but no fevers, vomiting, abdominal pain, urinary complaints. States that she's been coughing, and with some wheezing at night. No shortness of breath. She's tried albuterol inhaler for this without relief. There are no alleviating factors for her chest pain. Patient has tried Biofreeze gel and wearing a torso support  without relief. Patient denies any prolonged immobilization, surgery in the last 4 weeks, history of DVT or PE, hemoptysis, history of cancer, exogenous estrogen. States that her glucose has been running in the 100s, which is normal for her.   Patient also reports waking up with a right-sided, and frontal headache with mild photophobia. Reports some right-sided neck pain, which is not affected with movement. No dysarthria, visual changes, arm or leg weakness. No rash, fevers. States she's been having intermittent headaches like this over the past week. Does not know what her blood pressures were running during this time, as she does not measured at home. States she does take her medicine on a regular basis, but did not take it today.   ROS as noted in HPI. All other ROS negative.   Patient is a 46 y.o. female presenting with chest pain. The history is provided by the patient. No language interpreter was used.  Chest Pain The chest  pain began 5 - 7 days ago. Chest pain occurs intermittently. The pain is associated with breathing, coughing and lifting. The quality of the pain is described as sharp. The pain does not radiate. Chest pain is worsened by certain positions and deep breathing. Primary symptoms include shortness of breath, cough and nausea. Pertinent negatives for primary symptoms include no fever, no fatigue, no syncope, no wheezing, no palpitations, no abdominal pain and no vomiting.  Her past medical history is significant for hypertension.  Pertinent negatives for past medical history include no DVT and no PE.     Past Medical History  Diagnosis Date  . Diabetes mellitus   . Hypertension   . Asthma   . Iron deficiency anemia due to chronic blood loss   . Menometrorrhagia   . Psoriasis     History reviewed. No pertinent past surgical history.  No family history on file.  History  Substance Use Topics  . Smoking status: Never Smoker   . Smokeless tobacco: Not on file  . Alcohol Use: No    OB History    Grav Para Term Preterm Abortions TAB SAB Ect Mult Living                  Review of Systems  Constitutional: Negative for fever and fatigue.  Respiratory: Positive for cough and shortness of breath. Negative for wheezing.   Cardiovascular: Positive for chest pain. Negative for palpitations and syncope.  Gastrointestinal: Positive for nausea. Negative for vomiting and abdominal pain.    Allergies  Review of patient's allergies indicates no known allergies.  Home  Medications   Current Outpatient Rx  Name Route Sig Dispense Refill  . ALBUTEROL SULFATE (2.5 MG/3ML) 0.083% IN NEBU Nebulization Take 2.5 mg by nebulization every 6 (six) hours as needed.    [name] AMLODIPINE BESYLATE 5 MG PO TABS Oral Take 5 mg by mouth 2 (two) times daily.      [name] LORATADINE 10 MG PO TABS Oral Take 10 mg by mouth daily.      [name] LOSARTAN POTASSIUM 100 MG PO TABS Oral Take 100 mg by mouth daily.      [name] BIOFREEZE EX  Apply externally Apply topically.    [name] METFORMIN HCL 500 MG PO TABS Oral Take 500 mg by mouth 2 (two) times daily with a meal.     . GLIPIZIDE 5 MG PO TABS Oral Take 5 mg by mouth 2 (two) times daily before a meal.        BP 193/113  Pulse 100  Temp(Src) 98.3 F (36.8 C) (Oral)  Resp 24  SpO2 96%  LMP 10/12/2011 Filed Vitals:   11/07/11 0832 11/07/11 0841 11/07/11 1018 11/07/11 1019  BP: 194/115 195/121 179/107 193/113  Pulse: 96  89 100  Temp: 98.3 F (36.8 C)     TempSrc: Oral     Resp: 24     SpO2: 96%       Physical Exam  Nursing note and vitals reviewed. Constitutional: She is oriented to person, place, and time. She appears well-developed and well-nourished. No distress.       Crying, appears in moderate painful distress  HENT:  Head: Normocephalic and atraumatic.  Nose: Nose normal. Right sinus exhibits no maxillary sinus tenderness and no frontal sinus tenderness. Left sinus exhibits no maxillary sinus tenderness and no frontal sinus tenderness.  Mouth/Throat: Uvula is midline, oropharynx is clear and moist and mucous membranes are normal.  Eyes: Conjunctivae and EOM are normal. Pupils are equal, round, and reactive to light.       Nondilated funduscopic exam inadequate  Neck: Normal range of motion.  Cardiovascular: Normal rate, regular rhythm, normal heart sounds and intact distal pulses.   Pulmonary/Chest: Effort normal and breath sounds normal. No respiratory distress. She has no wheezes. She has no rales.         Limited  Effort due to pain. Focal chest tenderness, see drawing.   Abdominal: Soft. Bowel sounds are normal. She exhibits no distension and no mass. There is no tenderness. There is no rebound and no guarding.  Musculoskeletal: Normal range of motion. She exhibits no edema and no tenderness.        Calves symmetric, nontender. DP 2+.  Neurological: She is alert and oriented to person, place, and time. She has normal strength. No cranial nerve deficit  or sensory deficit. Gait normal.  Skin: Skin is warm and dry. No rash noted.  Psychiatric: Her speech is normal and behavior is normal. Judgment and thought content normal.       Tearful    ED Course  Procedures (including critical care time)  Labs Reviewed - No data to display Dg Chest 2 View  11/07/2011  *RADIOLOGY REPORT*  Clinical Data: Left lower chest pain, history of asthma  CHEST - 2 VIEW  Comparison: CT abdomen pelvis - 06/07/2011  Findings:  The lateral radiograph is obscured secondary to patient motion artifact.  Borderline enlarged cardiac silhouette and mediastinal contours.  Minimal bibasilar opacities favored to represent atelectasis.  No focal airspace opacities.  No pleural effusion or pneumothorax. No  acute osseous abnormalities.  IMPRESSION: Bibasilar opacities favored to represent atelectasis.  Original Report Authenticated By: Waynard Reeds, M.D.     1. Chest wall pain   2. Headache   3. Hypertension      MDM  Previous chart reviewed. BP on last visit 140/72.   Pt hypertensive today. Has not taken BP meds today and is crying, appears to be in pain. Unsure if pt's HA is from BP. Getting Ativan, ibuprofen for presumed muscle spasm, and we'll repeat her blood pressure, reevaluate patient's headache. Pt denies any visual changes, focal paresis, or new onset seizure activity. Pt denies any CV sx such as  palpitations, pedal edema, tearing pain radiating to back or abd. Pt denied any renal sx such as anuria or hematuria. Pt denies illicit drug use, most notably cocaine, or recent use of OTC medications such as nasal decongestants.  On reevaluation, patient's pain is much improved.  still with some chest wall tenderness, but this is improved. Portable X-ray shows atelectasis. No pneumothorax, pneumonia. Believe that the patient's chest pain is chest wall pain, from an asthma exacerbation/breathing against increased resistance. Pt perc negative. states she is still having  headache. Blood pressure still elevated at 179/107. No focal neuro deficits. Concern for hypertensive urgency. Transferring to ED.      Luiz Blare, MD 11/07/11 1045

## 2011-11-07 NOTE — ED Notes (Signed)
MD at bedside. 

## 2011-11-07 NOTE — ED Notes (Signed)
Pt reports she thought she pulled a muscle in her abd last week. Continued abd/rib/head pain since so she went to [facility] for eval. [facility] sent for further eval of complaint

## 2011-11-07 NOTE — ED Notes (Signed)
Patient vomiting contrast after drinking quickly. Zofran given and patient was told to wait 20-30 minutes before drinking contrast and then drinking slowly.

## 2011-11-07 NOTE — ED Notes (Signed)
CT notified pt was unable to hold oral contrast. Pt vomiting after each cup.

## 2011-11-07 NOTE — ED Notes (Signed)
Awaiting pharmacy to send Norvasc.

## 2011-11-07 NOTE — ED Notes (Addendum)
Pt reports lying in bed coughing last Thursday and now has left upper quad abd pain and right neck pain. She has been using biofreeze she had on hand with no relief. Feels like she can't take a deep breath. Describes pain a pulling. Pt also describes HA to the front of her head. Pt is tearful during triage due to her discomfort. Denies heavy lifting/injury. Pt reports increased pain during bowel movement this morning. The pt reports she has not taken her blood pressure medication today.

## 2011-11-07 NOTE — ED Notes (Signed)
Dinner tray ordered, Carb Mod Med.

## 2011-11-07 NOTE — ED Notes (Signed)
Patient ambulated to bathroom steady gait.  Vomited after returning to room.

## 2011-11-07 NOTE — ED Notes (Signed)
Pt vomiting x2 after cup 1 of oral contrast.

## 2011-11-08 LAB — COMPREHENSIVE METABOLIC PANEL
ALT: 11 U/L (ref 0–35)
AST: 21 U/L (ref 0–37)
Calcium: 9.6 mg/dL (ref 8.4–10.5)
Sodium: 137 mEq/L (ref 135–145)
Total Protein: 8.3 g/dL (ref 6.0–8.3)

## 2011-11-08 LAB — CBC
HCT: 33.3 % — ABNORMAL LOW (ref 36.0–46.0)
Hemoglobin: 10.3 g/dL — ABNORMAL LOW (ref 12.0–15.0)
MCH: 19.9 pg — ABNORMAL LOW (ref 26.0–34.0)
MCHC: 30.9 g/dL (ref 30.0–36.0)
MCV: 64.4 fL — ABNORMAL LOW (ref 78.0–100.0)

## 2011-11-08 LAB — DIFFERENTIAL
Basophils Relative: 0 % (ref 0–1)
Eosinophils Absolute: 0 10*3/uL (ref 0.0–0.7)
Lymphocytes Relative: 19 % (ref 12–46)
Lymphs Abs: 0.9 10*3/uL (ref 0.7–4.0)
Monocytes Absolute: 0.3 10*3/uL (ref 0.1–1.0)
Neutro Abs: 3.5 10*3/uL (ref 1.7–7.7)

## 2011-11-08 LAB — HEMOGLOBIN A1C: Mean Plasma Glucose: 114 mg/dL (ref <117)

## 2011-11-08 LAB — GLUCOSE, CAPILLARY
Glucose-Capillary: 106 mg/dL — ABNORMAL HIGH (ref 70–99)
Glucose-Capillary: 109 mg/dL — ABNORMAL HIGH (ref 70–99)
Glucose-Capillary: 132 mg/dL — ABNORMAL HIGH (ref 70–99)

## 2011-11-08 LAB — STREP PNEUMONIAE URINARY ANTIGEN: Strep Pneumo Urinary Antigen: NEGATIVE

## 2011-11-08 MED ORDER — AZITHROMYCIN 500 MG PO TABS
500.0000 mg | ORAL_TABLET | Freq: Every day | ORAL | Status: DC
  Administered 2011-11-08 – 2011-11-09 (×2): 500 mg via ORAL
  Filled 2011-11-08 (×2): qty 1

## 2011-11-08 MED ORDER — SODIUM CHLORIDE 0.9 % IV SOLN
INTRAVENOUS | Status: DC
  Administered 2011-11-08: 06:00:00 via INTRAVENOUS

## 2011-11-08 MED ORDER — INSULIN ASPART 100 UNIT/ML ~~LOC~~ SOLN
0.0000 [IU] | Freq: Three times a day (TID) | SUBCUTANEOUS | Status: DC
  Administered 2011-11-08: 3 [IU] via SUBCUTANEOUS

## 2011-11-08 MED ORDER — ONDANSETRON HCL 4 MG/2ML IJ SOLN
4.0000 mg | Freq: Four times a day (QID) | INTRAMUSCULAR | Status: DC | PRN
  Administered 2011-11-08: 4 mg via INTRAVENOUS
  Filled 2011-11-08: qty 2

## 2011-11-08 MED ORDER — LORATADINE 10 MG PO TABS
10.0000 mg | ORAL_TABLET | Freq: Every day | ORAL | Status: DC
  Administered 2011-11-08 – 2011-11-09 (×2): 10 mg via ORAL
  Filled 2011-11-08 (×2): qty 1

## 2011-11-08 MED ORDER — ALBUTEROL SULFATE (5 MG/ML) 0.5% IN NEBU
2.5000 mg | INHALATION_SOLUTION | Freq: Four times a day (QID) | RESPIRATORY_TRACT | Status: DC
  Administered 2011-11-08 – 2011-11-09 (×5): 2.5 mg via RESPIRATORY_TRACT
  Filled 2011-11-08 (×6): qty 0.5

## 2011-11-08 MED ORDER — WHITE PETROLATUM GEL
Status: AC
  Administered 2011-11-08: 13:00:00
  Filled 2011-11-08: qty 5

## 2011-11-08 MED ORDER — LOSARTAN POTASSIUM 50 MG PO TABS
100.0000 mg | ORAL_TABLET | Freq: Every day | ORAL | Status: DC
  Administered 2011-11-08 – 2011-11-09 (×2): 100 mg via ORAL
  Filled 2011-11-08 (×2): qty 2

## 2011-11-08 MED ORDER — ACETAMINOPHEN 325 MG PO TABS: 650.0000 mg | ORAL_TABLET | Freq: Four times a day (QID) | ORAL | Status: DC | PRN

## 2011-11-08 MED ORDER — DEXTROSE 5 % IV SOLN
1.0000 g | INTRAVENOUS | Status: DC
  Administered 2011-11-08: 1 g via INTRAVENOUS
  Filled 2011-11-08 (×2): qty 10

## 2011-11-08 MED ORDER — HYDROCODONE-ACETAMINOPHEN 5-325 MG PO TABS
1.0000 | ORAL_TABLET | ORAL | Status: DC | PRN
  Administered 2011-11-08: 1 via ORAL
  Administered 2011-11-08: 2 via ORAL
  Filled 2011-11-08: qty 2
  Filled 2011-11-08: qty 1

## 2011-11-08 MED ORDER — DEXTROSE 5 % IV SOLN
500.0000 mg | INTRAVENOUS | Status: DC
  Filled 2011-11-08: qty 500

## 2011-11-08 NOTE — Progress Notes (Signed)
Utilization review complete 

## 2011-11-08 NOTE — Progress Notes (Signed)
Patient ID: Cassandra Burns, female   DOB: 1966-03-29, 46 y.o.   MRN: [id number] PATIENT DETAILS Name: Cassandra Burns Age: 46 y.o. Sex: female Date of Birth: May 11, 1966 Admit Date: 11/07/2011 [email or web address], MD, MD   Subjective: Complaining of Left side pain and an improved but still present headache.  Has had longer term breathing problems and wondering if there was anything in her abdomen that may be causing it - is there anything in my stomach pressing on my organs?  I've seen Dr. [name] for GI in the past, but my insurance will only cover [facility] - can I see [facility].    Objective: Weight change:   Intake/Output Summary (Last 24 hours) at 11/08/11 1429 Last data filed at 11/08/11 1000  Gross per 24 hour  Intake      0 ml  Output    950 ml  Net   -950 ml   Blood pressure 152/94, pulse 75, temperature 97.3 F (36.3 C), temperature source Oral, resp. rate 16, height 4\' 11"  (1.499 m), weight 95.1 kg (209 lb 10.5 oz), last menstrual period 10/12/2011, SpO2 91.00%. Filed Vitals:   11/08/11 0134 11/08/11 0145 11/08/11 0448 11/08/11 1026  BP:   152/94   Pulse:  90 75   Temp:   97.3 F (36.3 C)   TempSrc:   Oral   Resp: 20 20 16    Height:      Weight:      SpO2:  99% 97% 91%    Physical Exam: General: No acute distress, Pleasant, eating breakfast. Lungs: Clear to auscultation bilaterally without wheezes or crackles, decreased breath sounds in Right posterior Lung area. Cardiovascular: Regular rate and rhythm without murmur gallop or rub normal S1 and S2 Abdomen: Nontender, Slightly distended, soft, bowel sounds positive, no rebound, no ascites, no appreciable mass Neuro:  Non-focal.  Basic Metabolic Panel:  Lab 11/08/11 6295 11/07/11 1153  NA 137 137  K 3.7 3.7  CL 101 102  CO2 21 22  GLUCOSE 89 109*  BUN 7 9  CREATININE 0.63 0.70  CALCIUM 9.6 10.2  MG -- --  PHOS -- --   Liver Function Tests:  Lab 11/08/11 0627 11/07/11 1153  AST 21 18    ALT 11 13  ALKPHOS 86 100  BILITOT 0.2* 0.3  PROT 8.3 9.0*  ALBUMIN 3.6 4.1   CBC:  Lab 11/08/11 0627 11/07/11 1153  WBC 4.7 4.6  NEUTROABS 3.5 3.3  HGB 10.3* 11.2*  HCT 33.3* 36.8  MCV 64.4* 65.0*  PLT 260 274   Cardiac Enzymes:  Lab 11/07/11 1328  CKTOTAL --  CKMB --  CKMBINDEX --  TROPONINI <0.30   D-Dimer:  Lab 11/07/11 1328  DDIMER 0.50*   CBG:  Lab 11/08/11 1154 11/08/11 0750 11/07/11 1717  GLUCAP 132* 91 154*   Hemoglobin A1C:  Lab 11/08/11 0031  HGBA1C 5.6    Studies/Results: Scheduled Meds:   . albuterol  2.5 mg Nebulization Q6H  . amLODipine  5 mg Oral BID  . azithromycin  500 mg Oral Once  . azithromycin  500 mg Oral Daily  . cefTRIAXone (ROCEPHIN)  IV  1 g Intravenous Once  . cefTRIAXone (ROCEPHIN)  IV  1 g Intravenous Q24H  .  HYDROmorphone (DILAUDID) injection  1 mg Intravenous Once  . insulin aspart  0-20 Units Subcutaneous TID WC  . loratadine  10 mg Oral Daily  . losartan  100 mg Oral Daily  . sodium chloride  1,000 mL Intravenous  Once  . sodium chloride  1,000 mL Intravenous Once  . white petrolatum      . DISCONTD: azithromycin  500 mg Intravenous Q24H   Continuous Infusions:   . sodium chloride 100 mL/hr at 11/08/11 0537   PRN Meds:.HYDROcodone-acetaminophen, ondansetron, ondansetron, ondansetron (ZOFRAN) IV  Anti-infectives:  Anti-infectives     Start     Dose/Rate Route Frequency Ordered Stop   11/08/11 2000   azithromycin (ZITHROMAX) 500 mg in dextrose 5 % 250 mL IVPB  Status:  Discontinued        500 mg 250 mL/hr over 60 Minutes Intravenous Every 24 hours 11/08/11 0015 11/08/11 0907   11/08/11 1800   cefTRIAXone (ROCEPHIN) 1 g in dextrose 5 % 50 mL IVPB        1 g 100 mL/hr over 30 Minutes Intravenous Every 24 hours 11/08/11 0015 11/15/11 1759   11/08/11 1000   azithromycin (ZITHROMAX) tablet 500 mg        500 mg Oral Daily 11/08/11 0907 11/14/11 0959   11/07/11 1900   cefTRIAXone (ROCEPHIN) 1 g in dextrose 5 % 50  mL IVPB        1 g 100 mL/hr over 30 Minutes Intravenous  Once 11/07/11 1845 11/07/11 1938   11/07/11 1900   azithromycin (ZITHROMAX) tablet 500 mg        500 mg Oral  Once 11/07/11 1845 11/07/11 1909          Assessment/Plan: Principal Problem:  *Nausea and vomiting Active Problems:  Iron deficiency anemia due to chronic blood loss  Hypertension  Community acquired pneumonia  DM (diabetes mellitus)  Morbid obesity  Assessment this is a 46 year old morbidly obese female been admitted with what appears to be community-acquired pneumonia in a patient with history of asthma obstructive sleep apnea as well as well controlled diabetes.   community-acquired pneumonia:  Patient appears improved this morning.  Eating and able to take PO meds.  Denies shortness of breath.  Will continue Azith and rocephin.  Likely d/c on Levaquin 11/09/11.  diabetes: The patient's hgb A1c is now down to 5.6 from 12   Patient has had contrast media so we will hold the metformin and start her on sliding scale insulin here in the hospital.  hypertension: Continue with the home medications and watch closely. She had had a systolic blood pressure in the range of 200s when she arrived that can cause the nausea and vomiting also.  Patient is on home (PTA) medications and SBP is in the 130 - 160 range.  Will D/C fluids.  Was she compliant with her medications at home?  Consider adding a BP agent if still elevated 4/13.  intractable nausea vomiting - resolved.  PNA related versus possibly secondary to elevated BP.  Headache:  Possibly BP related.  Will give tylenol for mild and norco for more severe pain.  CT Head negative.  obstructive sleep apnea: She uses CPAP at home which we will continue with in the hospital.  enlarged mediastinal and bilateral hilar lymph nodes.  Stable being followed by Dr. Gaylyn Rong as an outpatient-per Dr Gaylyn Rong- he is following this as outpatient for the past several years, and recommending biopsy  of >3cm  DVT Prophylaxis:  SCDs.   LOS: 1 day    Stephani Police 11/08/2011, 2:29 PM [phone] Attending I have seen and examined this patient, I agree with the above noted assessment and plan  Dr Windell Norfolk

## 2011-11-09 LAB — GLUCOSE, CAPILLARY
Glucose-Capillary: 104 mg/dL — ABNORMAL HIGH (ref 70–99)
Glucose-Capillary: 105 mg/dL — ABNORMAL HIGH (ref 70–99)

## 2011-11-09 LAB — CBC
HCT: 34.2 % — ABNORMAL LOW (ref 36.0–46.0)
Hemoglobin: 10.3 g/dL — ABNORMAL LOW (ref 12.0–15.0)
MCHC: 30.1 g/dL (ref 30.0–36.0)
RBC: 5.28 MIL/uL — ABNORMAL HIGH (ref 3.87–5.11)
WBC: 3.9 10*3/uL — ABNORMAL LOW (ref 4.0–10.5)

## 2011-11-09 LAB — EXPECTORATED SPUTUM ASSESSMENT W GRAM STAIN, RFLX TO RESP C

## 2011-11-09 MED ORDER — LEVOFLOXACIN 750 MG PO TABS: 750.0000 mg | ORAL_TABLET | Freq: Every day | ORAL | Status: AC

## 2011-11-09 MED ORDER — HYDROCODONE-ACETAMINOPHEN 5-325 MG PO TABS: 1.0000 | ORAL_TABLET | Freq: Four times a day (QID) | ORAL | Status: AC | PRN

## 2011-11-09 MED ORDER — ONDANSETRON HCL 4 MG PO TABS: 4.0000 mg | ORAL_TABLET | Freq: Three times a day (TID) | ORAL | Status: AC | PRN

## 2011-11-09 NOTE — Progress Notes (Signed)
Pt. Tolerated her dinner well. Denies nausea. Merita Hawks E

## 2011-11-09 NOTE — Progress Notes (Signed)
Cassandra Burns to be D/C'd Home per MD order.  Discussed with the patient and all questions fully answered.   [name], [name]  Home Medication Instructions [id number]   Printed on:11/09/11 1655  Medication Information                    amLODipine (NORVASC) 5 MG tablet Take 5 mg by mouth 2 (two) times daily.             loratadine (CLARITIN) 10 MG tablet Take 10 mg by mouth daily.             losartan (COZAAR) 100 MG tablet Take 100 mg by mouth daily.             metFORMIN (GLUCOPHAGE) 500 MG tablet Take 500 mg by mouth 2 (two) times daily with a meal.            albuterol (PROVENTIL) (2.5 MG/3ML) 0.083% nebulizer solution Take 2.5 mg by nebulization every 6 (six) hours as needed. For shortness of breath           Menthol, Topical Analgesic, (BIOFREEZE EX) Apply 1 application topically as needed. For stomach for pain.           HYDROcodone-acetaminophen (NORCO) 5-325 MG per tablet Take 1 tablet by mouth every 6 (six) hours as needed for pain.           levofloxacin (LEVAQUIN) 750 MG tablet Take 1 tablet (750 mg total) by mouth daily.           ondansetron (ZOFRAN) 4 MG tablet Take 1 tablet (4 mg total) by mouth every 8 (eight) hours as needed for nausea.             VVS, Skin clean, dry and intact without evidence of skin break down, no evidence of skin tears noted. IV catheter discontinued intact. Site without signs and symptoms of complications. Dressing and pressure applied.  An After Visit Summary was printed and given to the patient. Follow up appointments , new prescriptions and medication administration times given Patient escorted via WC, and D/C home via private auto.  Cindra Eves, RN 11/09/2011 4:55 PM

## 2011-11-09 NOTE — Discharge Summary (Signed)
PATIENT DETAILS Name: Cassandra Burns Age: 46 y.o. Sex: female Date of Birth: 12/18/1965 MRN: [id number]. Admit Date: 11/07/2011 Admitting Physician: Rometta Emery, MD WUJ:WJXBJ,YNWGNFAOZ, MD, MD  PRIMARY DISCHARGE DIAGNOSIS:  Principal Problem:  *Nausea and vomiting Active Problems:  Iron deficiency anemia due to chronic blood loss  Hypertension  Community acquired pneumonia  DM (diabetes mellitus)  Morbid obesity      PAST MEDICAL HISTORY: Past Medical History  Diagnosis Date  . Diabetes mellitus   . Hypertension   . Asthma   . Iron deficiency anemia due to chronic blood loss   . Menometrorrhagia   . Psoriasis     DISCHARGE MEDICATIONS: Medication List  As of 11/09/2011  1:01 PM   TAKE these medications         albuterol (2.5 MG/3ML) 0.083% nebulizer solution   Commonly known as: PROVENTIL   Take 2.5 mg by nebulization every 6 (six) hours as needed. For shortness of breath      amLODipine 5 MG tablet   Commonly known as: NORVASC   Take 5 mg by mouth 2 (two) times daily.      BIOFREEZE EX   Apply 1 application topically as needed. For stomach for pain.      HYDROcodone-acetaminophen 5-325 MG per tablet   Commonly known as: NORCO   Take 1 tablet by mouth every 6 (six) hours as needed for pain.      levofloxacin 750 MG tablet   Commonly known as: LEVAQUIN   Take 1 tablet (750 mg total) by mouth daily.      loratadine 10 MG tablet   Commonly known as: CLARITIN   Take 10 mg by mouth daily.      losartan 100 MG tablet   Commonly known as: COZAAR   Take 100 mg by mouth daily.      metFORMIN 500 MG tablet   Commonly known as: GLUCOPHAGE   Take 500 mg by mouth 2 (two) times daily with a meal.      ondansetron 4 MG tablet   Commonly known as: ZOFRAN   Take 1 tablet (4 mg total) by mouth every 8 (eight) hours as needed for nausea.             BRIEF HPI:  See H&P, Labs, Consult and Test reports for all details in brief,This 46 year old  morbidly obese female with history of diabetes type 2 currently on metformin presented with continuous cough for 4 days prior to admission.. This led to some chest discomfort on pain which she attributed to the cough. She also started having nausea and vomiting today and has been unable to keep anything down. She has malaise some fever but no chills. Her cough is nonproductive initially but now some white sputum. Denied any hematemesis nor hemoptysis   CONSULTATIONS:   None  PERTINENT RADIOLOGIC STUDIES: Dg Chest 2 View  11/07/2011  *RADIOLOGY REPORT*  Clinical Data: Left lower chest pain, history of asthma  CHEST - 2 VIEW  Comparison: CT abdomen pelvis - 06/07/2011  Findings:  The lateral radiograph is obscured secondary to patient motion artifact.  Borderline enlarged cardiac silhouette and mediastinal contours.  Minimal bibasilar opacities favored to represent atelectasis.  No focal airspace opacities.  No pleural effusion or pneumothorax. No acute osseous abnormalities.  IMPRESSION: Bibasilar opacities favored to represent atelectasis.  Original Report Authenticated By: Waynard Reeds, M.D.   Ct Head Wo Contrast  11/07/2011  *RADIOLOGY REPORT*  Clinical Data: Headache  CT HEAD WITHOUT CONTRAST  Technique:  Contiguous axial images were obtained from the base of the skull through the vertex without contrast.  Comparison: None.  Findings: Examination is constrained by streak artifact due to the patient's body habitus.  No evidence of parenchymal hemorrhage or extra-axial fluid collection. No mass lesion, mass effect, or midline shift.  No gross CT evidence of acute infarction.  Cerebral volume is age appropriate.  No ventriculomegaly.  The visualized paranasal sinuses are essentially clear. The mastoid air cells are unopacified.  No evidence of calvarial fracture.  IMPRESSION: Examination is constrained by streak artifact due to the patient's body habitus.  No gross intracranial abnormality is seen.   Original Report Authenticated By: Charline Bills, M.D.   Ct Angio Chest W/cm &/or Wo Cm  11/07/2011  *RADIOLOGY REPORT*  Clinical Data:  Chest pain, evaluate for PE.  Left upper quadrant abdominal pain.  CT ANGIOGRAPHY CHEST CT ABDOMEN AND PELVIS WITH CONTRAST  Technique:  Multidetector CT imaging of the chest was performed using the standard protocol during bolus administration of intravenous contrast.  Multiplanar CT image reconstructions including MIPs were obtained to evaluate the vascular anatomy. Multidetector CT imaging of the abdomen and pelvis was performed using the standard protocol during bolus administration of intravenous contrast.  Contrast:  100 ml Omnipaque-300 IV  Comparison:  CT abdomen pelvis dated 06/07/2011.  CTA CHEST  Findings:  No evidence of pulmonary embolism.  Patchy opacities in the right upper lobe, lingula, and right lower lobe, suspicious for multifocal pneumonia.  No pleural effusion or pneumothorax.  Visualized thyroid is unremarkable.  Mild cardiomegaly.  No pericardial effusion.  Mild atherosclerotic calcifications of the aortic arch.  Mediastinal and bilateral lymph nodes, including: --12 mm short-axis high right paratracheal node (series 7/image 61) --10 mm short-axis prevascular node (series 7/image 92) --10 mm short axis precarinal node (series 7/image 94) --10 mm short-axis right hilar node (series 7/image 95) --12 mm short-axis left lateral node (series 7/image 111) --14 mm short-axis subcarinal node (series 7/image 128)  Degenerative changes of the thoracic spine.   Review of the MIP images confirms the above findings.  IMPRESSION: No evidence of pulmonary embolism.  Patchy opacities in the right upper lobe, lingula, and right lower lobe, suspicious for multifocal pneumonia.  Mild cardiomegaly.  Mildly enlarged mediastinal and bilateral hilar lymph nodes, as described above.  CT ABDOMEN AND PELVIS  Findings: Liver, spleen, pancreas, and adrenal glands are within normal  limits.  Gallbladder is unremarkable.  No intrahepatic or extrahepatic ductal dilatation.  Kidneys are within normal limits.  No hydronephrosis.  No evidence of bowel obstruction.  Normal appendix.  Atherosclerotic calcifications of the abdominal aorta and branch vessels.  No abdominopelvic ascites.  Enlarged upper abdominal lymph nodes, grossly unchanged, including: --1.9 cm short-axis peripancreatic node (series 13/image 18) --1.6 cm short-axis portacaval node (series 2/image 21) --1.7 cm short-axis splenic hilar node (series 13/image 23)  Additionally, there is a 11 mm short-axis left external iliac node (series 13/image 64).  Multiple uterine fibroids, some of which are partially calcified. Ovaries are unremarkable.  Bladder is within normal limits.  Mild degenerative changes of the lumbar spine.  Review of the MIP images confirms the above findings.  IMPRESSION: Stable upper abdominal lymphadenopathy, as described above.  Uterine fibroids.  Original Report Authenticated By: Charline Bills, M.D.   Ct Abdomen Pelvis W Contrast  11/07/2011  *RADIOLOGY REPORT*  Clinical Data:  Chest pain, evaluate for PE.  Left upper quadrant abdominal pain.  CT ANGIOGRAPHY CHEST CT ABDOMEN AND PELVIS WITH CONTRAST  Technique:  Multidetector CT imaging of the chest was performed using the standard protocol during bolus administration of intravenous contrast.  Multiplanar CT image reconstructions including MIPs were obtained to evaluate the vascular anatomy. Multidetector CT imaging of the abdomen and pelvis was performed using the standard protocol during bolus administration of intravenous contrast.  Contrast:  100 ml Omnipaque-300 IV  Comparison:  CT abdomen pelvis dated 06/07/2011.  CTA CHEST  Findings:  No evidence of pulmonary embolism.  Patchy opacities in the right upper lobe, lingula, and right lower lobe, suspicious for multifocal pneumonia.  No pleural effusion or pneumothorax.  Visualized thyroid is unremarkable.   Mild cardiomegaly.  No pericardial effusion.  Mild atherosclerotic calcifications of the aortic arch.  Mediastinal and bilateral lymph nodes, including: --12 mm short-axis high right paratracheal node (series 7/image 61) --10 mm short-axis prevascular node (series 7/image 92) --10 mm short axis precarinal node (series 7/image 94) --10 mm short-axis right hilar node (series 7/image 95) --12 mm short-axis left lateral node (series 7/image 111) --14 mm short-axis subcarinal node (series 7/image 128)  Degenerative changes of the thoracic spine.   Review of the MIP images confirms the above findings.  IMPRESSION: No evidence of pulmonary embolism.  Patchy opacities in the right upper lobe, lingula, and right lower lobe, suspicious for multifocal pneumonia.  Mild cardiomegaly.  Mildly enlarged mediastinal and bilateral hilar lymph nodes, as described above.  CT ABDOMEN AND PELVIS  Findings: Liver, spleen, pancreas, and adrenal glands are within normal limits.  Gallbladder is unremarkable.  No intrahepatic or extrahepatic ductal dilatation.  Kidneys are within normal limits.  No hydronephrosis.  No evidence of bowel obstruction.  Normal appendix.  Atherosclerotic calcifications of the abdominal aorta and branch vessels.  No abdominopelvic ascites.  Enlarged upper abdominal lymph nodes, grossly unchanged, including: --1.9 cm short-axis peripancreatic node (series 13/image 18) --1.6 cm short-axis portacaval node (series 2/image 21) --1.7 cm short-axis splenic hilar node (series 13/image 23)  Additionally, there is a 11 mm short-axis left external iliac node (series 13/image 64).  Multiple uterine fibroids, some of which are partially calcified. Ovaries are unremarkable.  Bladder is within normal limits.  Mild degenerative changes of the lumbar spine.  Review of the MIP images confirms the above findings.  IMPRESSION: Stable upper abdominal lymphadenopathy, as described above.  Uterine fibroids.  Original Report Authenticated  By: Charline Bills, M.D.     PERTINENT LAB RESULTS: CBC:  Basename 11/09/11 0600 11/08/11 0627  WBC 3.9* 4.7  HGB 10.3* 10.3*  HCT 34.2* 33.3*  PLT 267 260   CMET CMP     Component Value Date/Time   NA 137 11/08/2011 0627   K 3.7 11/08/2011 0627   CL 101 11/08/2011 0627   CO2 21 11/08/2011 0627   GLUCOSE 89 11/08/2011 0627   BUN 7 11/08/2011 0627   CREATININE 0.63 11/08/2011 0627   CALCIUM 9.6 11/08/2011 0627   PROT 8.3 11/08/2011 0627   ALBUMIN 3.6 11/08/2011 0627   AST 21 11/08/2011 0627   ALT 11 11/08/2011 0627   ALKPHOS 86 11/08/2011 0627   BILITOT 0.2* 11/08/2011 0627   GFRNONAA >90 11/08/2011 0627   GFRAA >90 11/08/2011 0627    GFR Estimated Creatinine Clearance: 88.8 ml/min (by C-G formula based on Cr of 0.63). No results found for this basename: LIPASE:2,AMYLASE:2 in the last 72 hours  Basename 11/07/11 1328  CKTOTAL --  CKMB --  CKMBINDEX --  TROPONINI <0.30  No components found with this basename: POCBNP:3  Basename 11/07/11 1328  DDIMER 0.50*    Basename 11/08/11 0031  HGBA1C 5.6   No results found for this basename: CHOL:2,HDL:2,LDLCALC:2,TRIG:2,CHOLHDL:2,LDLDIRECT:2 in the last 72 hours No results found for this basename: TSH,T4TOTAL,FREET3,T3FREE,THYROIDAB in the last 72 hours No results found for this basename: VITAMINB12:2,FOLATE:2,FERRITIN:2,TIBC:2,IRON:2,RETICCTPCT:2 in the last 72 hours Coags: No results found for this basename: PT:2,INR:2 in the last 72 hours Microbiology: Recent Results (from the past 240 hour(s))  CULTURE, BLOOD (ROUTINE X 2)     Status: Normal (Preliminary result)   Collection Time   11/08/11 12:30 AM      Component Value Range Status Comment   Specimen Description BLOOD RIGHT HAND   Final    Special Requests BOTTLES DRAWN AEROBIC AND ANAEROBIC 5CC   Final    Culture  Setup Time [id number]   Final    Culture     Final    Value:        BLOOD CULTURE RECEIVED NO GROWTH TO DATE CULTURE WILL BE HELD FOR 5 DAYS BEFORE ISSUING  A FINAL NEGATIVE REPORT   Report Status PENDING   Incomplete   CULTURE, BLOOD (ROUTINE X 2)     Status: Normal (Preliminary result)   Collection Time   11/08/11 12:58 AM      Component Value Range Status Comment   Specimen Description BLOOD LEFT HAND   Final    Special Requests BOTTLES DRAWN AEROBIC ONLY 1CC   Final    Culture  Setup Time [id number]   Final    Culture     Final    Value:        BLOOD CULTURE RECEIVED NO GROWTH TO DATE CULTURE WILL BE HELD FOR 5 DAYS BEFORE ISSUING A FINAL NEGATIVE REPORT   Report Status PENDING   Incomplete   CULTURE, SPUTUM-ASSESSMENT     Status: Normal   Collection Time   11/09/11  7:45 AM      Component Value Range Status Comment   Specimen Description SPUTUM   Final    Special Requests NONE   Final    Sputum evaluation     Final    Value: MICROSCOPIC FINDINGS SUGGEST THAT THIS SPECIMEN IS NOT REPRESENTATIVE OF LOWER RESPIRATORY SECRETIONS. PLEASE RECOLLECT.     CALLED TO TONI 11/09/11 1150 EHOWARD   Report Status 11/09/2011 FINAL   Final      BRIEF HOSPITAL COURSE:   Community acquired pneumonia -patient was admitted with nausea/vomting/ cough and chest pain, she was found to have patchy infiltrates on CT Angio of the chest. She did not have leukocytosis or a fever. She was placed on empiric Rocephin and Zithromax and admitted to a regular medical bed. With antibiotic therapy she has improved significantly. Her nausea and vomiting have resolved, and she is now tolerating a regular diet. She will be transitioned to Levaquin on discharge. Her bilateral chest pain was thought to be secondary to persistant coughing, CTA chest was negative for PE. This is much better, she will be provided with as needed narcotics for a few days  intractable nausea vomiting - resolved.   Headache: Possibly related tp uncontrolled HTN.  CT Head negative.Significantly better at time of discharge. Claims to have a history of Migraine Headaches as well.  diabetes: The  patient's hgb A1c is now down to 5.6 from 12, continue with Metformin at discharge  enlarged mediastinal and bilateral hilar lymph nodes. Stable being followed by Dr. Gaylyn Rong as an outpatient-per  Dr Gaylyn Rong- he is following this as outpatient for the past several years, and recommending biopsy of >3cm  obstructive sleep apnea: She uses CPAP at home   hypertension: Continue with the home medications and watch closely. She had had a systolic blood pressure in the range of 200s when she arrived that can cause the nausea and vomiting also. Patient is on home (PTA) medications and SBP is in the 130 - 160 range. Currently BP moderately well controlled, will defer to her PCP for further optimization.  TODAY-DAY OF DISCHARGE:  Subjective:   Unika Nazareno today has no headache,no chest abdominal pain,no new weakness tingling or numbness, feels much better wants to go home today. Her headache and right sided chest pain are much better.  Objective:   Blood pressure 151/91, pulse 93, temperature 98 F (36.7 C), temperature source Oral, resp. rate 20, height 4\' 11"  (1.499 m), weight 95.1 kg (209 lb 10.5 oz), last menstrual period 10/12/2011, SpO2 95.00%.  Intake/Output Summary (Last 24 hours) at 11/09/11 1301 Last data filed at 11/09/11 0900  Gross per 24 hour  Intake    720 ml  Output    450 ml  Net    270 ml    Exam Awake Alert, Oriented *3, No new F.N deficits, Normal affect [place].AT,PERRAL Supple Neck,No JVD, No cervical lymphadenopathy appriciated.  Symmetrical Chest wall movement, Good air movement bilaterally, CTAB RRR,No Gallops,Rubs or new Murmurs, No Parasternal Heave +ve B.Sounds, Abd Soft, Non tender, No organomegaly appriciated, No rebound -guarding or rigidity. No Cyanosis, Clubbing or edema, No new Rash or bruise  DISPOSITION: Home  DISCHARGE INSTRUCTIONS:    Follow-up Information    Follow up with [facility], MD. Schedule an appointment as soon as possible for a visit in 1  week.         Total Time spent on discharge equals 45 minutes.  SignedJeoffrey Massed 11/09/2011 1:01 PM

## 2011-11-10 NOTE — ED Provider Notes (Signed)
Medical screening examination/treatment/procedure(s) were conducted as a shared visit with non-physician practitioner(s) and myself.  I personally evaluated the patient during the encounter  LUQ pain, gradual onset headache, transferred from [facility] with hypertension.  Did not take meds today.  No neuro deficits.  Glynn Octave, MD 11/10/11 [phone]

## 2011-11-12 ENCOUNTER — Encounter (HOSPITAL_COMMUNITY): Payer: Medicare Other

## 2011-11-14 ENCOUNTER — Encounter (HOSPITAL_COMMUNITY): Payer: Medicare Other

## 2011-11-14 LAB — CULTURE, BLOOD (ROUTINE X 2)
Culture  Setup Time: 201304120500
Culture: NO GROWTH

## 2011-11-19 ENCOUNTER — Encounter (HOSPITAL_COMMUNITY): Payer: Medicare Other

## 2011-11-21 ENCOUNTER — Encounter (HOSPITAL_COMMUNITY): Payer: Medicare Other

## 2011-11-26 ENCOUNTER — Encounter (HOSPITAL_COMMUNITY): Payer: Medicare Other

## 2011-11-28 ENCOUNTER — Encounter (HOSPITAL_COMMUNITY): Payer: Medicare Other

## 2011-12-03 ENCOUNTER — Encounter (HOSPITAL_COMMUNITY): Payer: Medicare Other

## 2011-12-05 ENCOUNTER — Encounter (HOSPITAL_COMMUNITY): Payer: Medicare Other

## 2011-12-10 ENCOUNTER — Encounter (HOSPITAL_COMMUNITY): Payer: Medicare Other

## 2011-12-12 ENCOUNTER — Encounter (HOSPITAL_COMMUNITY): Payer: Medicare Other

## 2011-12-17 ENCOUNTER — Encounter (HOSPITAL_COMMUNITY): Payer: Medicare Other

## 2011-12-19 ENCOUNTER — Encounter (HOSPITAL_COMMUNITY): Payer: Medicare Other

## 2011-12-20 ENCOUNTER — Telehealth: Payer: Self-pay | Admitting: Oncology

## 2011-12-20 ENCOUNTER — Ambulatory Visit (HOSPITAL_BASED_OUTPATIENT_CLINIC_OR_DEPARTMENT_OTHER): Payer: Medicare Other | Admitting: Oncology

## 2011-12-20 ENCOUNTER — Ambulatory Visit: Payer: Medicare Other | Admitting: Oncology

## 2011-12-20 ENCOUNTER — Other Ambulatory Visit (HOSPITAL_BASED_OUTPATIENT_CLINIC_OR_DEPARTMENT_OTHER): Payer: Medicare Other

## 2011-12-20 ENCOUNTER — Encounter: Payer: Self-pay | Admitting: Oncology

## 2011-12-20 VITALS — BP 136/83 | HR 91 | Temp 97.7°F | Ht <= 58 in | Wt 218.2 lb

## 2011-12-20 DIAGNOSIS — N920 Excessive and frequent menstruation with regular cycle: Secondary | ICD-10-CM

## 2011-12-20 DIAGNOSIS — N921 Excessive and frequent menstruation with irregular cycle: Secondary | ICD-10-CM

## 2011-12-20 DIAGNOSIS — D509 Iron deficiency anemia, unspecified: Secondary | ICD-10-CM

## 2011-12-20 DIAGNOSIS — D500 Iron deficiency anemia secondary to blood loss (chronic): Secondary | ICD-10-CM

## 2011-12-20 DIAGNOSIS — D649 Anemia, unspecified: Secondary | ICD-10-CM

## 2011-12-20 LAB — COMPREHENSIVE METABOLIC PANEL
ALT: 12 U/L (ref 0–35)
AST: 14 U/L (ref 0–37)
CO2: 25 mEq/L (ref 19–32)
Calcium: 10.1 mg/dL (ref 8.4–10.5)
Chloride: 106 mEq/L (ref 96–112)
Creatinine, Ser: 0.91 mg/dL (ref 0.50–1.10)
Sodium: 139 mEq/L (ref 135–145)
Total Protein: 8 g/dL (ref 6.0–8.3)

## 2011-12-20 LAB — CBC WITH DIFFERENTIAL/PLATELET
BASO%: 0.3 % (ref 0.0–2.0)
Eosinophils Absolute: 0.1 10*3/uL (ref 0.0–0.5)
LYMPH%: 24.7 % (ref 14.0–49.7)
MCHC: 31.5 g/dL (ref 31.5–36.0)
MONO#: 0.5 10*3/uL (ref 0.1–0.9)
NEUT#: 2.6 10*3/uL (ref 1.5–6.5)
RBC: 5.04 10*6/uL (ref 3.70–5.45)
RDW: 26 % — ABNORMAL HIGH (ref 11.2–14.5)
WBC: 4.2 10*3/uL (ref 3.9–10.3)
lymph#: 1 10*3/uL (ref 0.9–3.3)
nRBC: 0 % (ref 0–0)

## 2011-12-20 LAB — IRON AND TIBC
%SAT: 11 % — ABNORMAL LOW (ref 20–55)
TIBC: 331 ug/dL (ref 250–470)

## 2011-12-20 LAB — FERRITIN: Ferritin: 56 ng/mL (ref 10–291)

## 2011-12-20 NOTE — Progress Notes (Signed)
[facility]  [phone] [phone]   OFFICE PROGRESS NOTE   Cc:  DEWEY,ELIZABETH, MD, MD  DIAGNOSIS: Microcytic anemia from iron deficiency anemia from most likely menometrorrhagia. She had negative GI workup.   CURRENT THERAPY: Parenteral iron infusion prn. Her last IV [name] was in March 2013; transfusion for Hgb <7 for severe anemia.   INTERVAL HISTORY: Cassandra Burns 46 y.o. female returns for regular follow up with her niece. No recent menometrorrhagia; patient reports that she has not had a period in the past 2 months. She has not been on any oral contraceptive area she denies any family history of blood clot or personal history of blood clots. She had evaluation by GI that was negative for any GI source of bleeding. She reports intermittent bright red blood from rectum; GI work-up negative. She denies city epistaxis, hemoptysis, hematemesis, hematuria. She denies history of symptoms of anemia such as chest pain, palpitation, dizziness, city fatigue. She has had mid epigastric abdominal pain for many years and this pain has been now less severe with a pain described as mild. The pain is crampy, and its severity depends on how much and what she eats. She does not require chronic pain medication for over-the-counter pain medication for the pain. The pain does not radiate anywhere else from midepigastric area. She was hospitalized in April for pneumonia.    Past Medical History  Diagnosis Date  . Diabetes mellitus   . Hypertension   . Asthma   . Iron deficiency anemia due to chronic blood loss   . Menometrorrhagia   . Psoriasis     History reviewed. No pertinent past surgical history.  Current Outpatient Prescriptions  Medication Sig Dispense Refill  . albuterol (PROVENTIL) (2.5 MG/3ML) 0.083% nebulizer solution Take 2.5 mg by nebulization every 6 (six) hours as needed. For shortness of breath      . amLODipine (NORVASC) 5 MG tablet  Take 5 mg by mouth 2 (two) times daily.        [name] loratadine (CLARITIN) 10 MG tablet Take 10 mg by mouth daily.        [name] losartan (COZAAR) 100 MG tablet Take 100 mg by mouth daily.        . Menthol, Topical Analgesic, (BIOFREEZE EX) Apply 1 application topically as needed. For stomach for pain.      . metFORMIN (GLUCOPHAGE) 500 MG tablet Take 500 mg by mouth 2 (two) times daily with a meal.         ALLERGIES:   has no known allergies.  REVIEW OF SYSTEMS:  The rest of the 14-point review of system was negative.   Filed Vitals:   12/20/11 1322  BP: 136/83  Pulse: 91  Temp: 97.7 F (36.5 C)   Wt Readings from Last 3 Encounters:  12/20/11 218 lb 3.2 oz (98.975 kg)  11/08/11 209 lb 10.5 oz (95.1 kg)  06/10/11 216 lb (97.977 kg)   ECOG Performance status: 0  PHYSICAL EXAMINATION:  General: Obese woman in no acute distress. Eyes: no scleral icterus. ENT: There were no oropharyngeal lesions. Neck was without thyromegaly. Lymphatics: Negative cervical, supraclavicular or axillary adenopathy. Respiratory: lungs were clear bilaterally without wheezing or crackles. Cardiovascular: Regular rate and rhythm, S1/S2, without murmur, rub or gallop. There was no pedal edema. GI: abdomen was soft, obese, nontender, nondistended, without organomegaly. Muscoloskeletal: no spinal tenderness of palpation of vertebral spine. Skin exam was without echymosis, petichae. Neuro exam was nonfocal. Patient was able  to get on and off exam table without assistance. Gait was normal. Patient was alerted and oriented. Attention was good. Language was appropriate. Mood was normal without depression. Speech was not pressured. Thought content was not tangential.   LABORATORY/RADIOLOGY DATA:  Lab Results  Component Value Date   WBC 4.2 12/20/2011   HGB 10.8* 12/20/2011   HCT 34.3* 12/20/2011   PLT 227 12/20/2011   GLUCOSE 89 11/08/2011   ALKPHOS 86 11/08/2011   ALT 11 11/08/2011   AST 21 11/08/2011   NA 137 11/08/2011   K 3.7  11/08/2011   CL 101 11/08/2011   CREATININE 0.63 11/08/2011   BUN 7 11/08/2011   CO2 21 11/08/2011   HGBA1C 5.6 11/08/2011   RADIOLOGY:  *RADIOLOGY REPORT*  Clinical Data: Chest pain, evaluate for PE. Left upper quadrant  abdominal pain.  CT ANGIOGRAPHY CHEST  CT ABDOMEN AND PELVIS WITH CONTRAST  Technique: Multidetector CT imaging of the chest was performed  using the standard protocol during bolus administration of  intravenous contrast. Multiplanar CT image reconstructions  including MIPs were obtained to evaluate the vascular anatomy.  Multidetector CT imaging of the abdomen and pelvis was performed  using the standard protocol during bolus administration of  intravenous contrast.  Contrast: 100 ml Omnipaque-300 IV  Comparison: CT abdomen pelvis dated 06/07/2011.  CTA CHEST  Findings: No evidence of pulmonary embolism.  Patchy opacities in the right upper lobe, lingula, and right lower  lobe, suspicious for multifocal pneumonia. No pleural effusion or  pneumothorax.  Visualized thyroid is unremarkable.  Mild cardiomegaly. No pericardial effusion. Mild atherosclerotic  calcifications of the aortic arch.  Mediastinal and bilateral lymph nodes, including:  --12 mm short-axis high right paratracheal node (series 7/image 61)  --10 mm short-axis prevascular node (series 7/image 92)  --10 mm short axis precarinal node (series 7/image 94)  --10 mm short-axis right hilar node (series 7/image 95)  --12 mm short-axis left lateral node (series 7/image 111)  --14 mm short-axis subcarinal node (series 7/image 128)  Degenerative changes of the thoracic spine.  Review of the MIP images confirms the above findings.  IMPRESSION:  No evidence of pulmonary embolism.  Patchy opacities in the right upper lobe, lingula, and right lower  lobe, suspicious for multifocal pneumonia.  Mild cardiomegaly.  Mildly enlarged mediastinal and bilateral hilar lymph nodes, as  described above.  CT ABDOMEN  AND PELVIS  Findings: Liver, spleen, pancreas, and adrenal glands are within  normal limits.  Gallbladder is unremarkable. No intrahepatic or extrahepatic  ductal dilatation.  Kidneys are within normal limits. No hydronephrosis.  No evidence of bowel obstruction. Normal appendix.  Atherosclerotic calcifications of the abdominal aorta and branch  vessels.  No abdominopelvic ascites.  Enlarged upper abdominal lymph nodes, grossly unchanged, including:  --1.9 cm short-axis peripancreatic node (series 13/image 18)  --1.6 cm short-axis portacaval node (series 2/image 21)  --1.7 cm short-axis splenic hilar node (series 13/image 23)  Additionally, there is a 11 mm short-axis left external iliac node  (series 13/image 64).  Multiple uterine fibroids, some of which are partially calcified.  Ovaries are unremarkable.  Bladder is within normal limits.  Mild degenerative changes of the lumbar spine.  Review of the MIP images confirms the above findings.  IMPRESSION:  Stable upper abdominal lymphadenopathy, as described above.  Uterine fibroids.  Original Report Authenticated By: Charline Bills, M.D.  ASSESSMENT AND PLAN:   1. Iron-deficiency anemia. Most slightly due to menometrorrhagia. Hemoglobin is up slightly to 10.8. Ferritin is  pending. We will let her know if she need any additional Feraheme. I have recommended that she start either Slow-Fe or Nu-Iron. CT scan from April 2013 showed uterine fibroids and she will discuss this further with PCP. 2. Reportedly gastrointestinal bleeding. Negative work up with Dr. [name]. 3. Dysmenorrhagia. I discussed with her to talk with her primary care physician to see whether she is a candidate for oral contraceptives to decrease her risk of further iron blood loss; however, she has not had a period in about 2 months. 4. Diabetes mellitus, type II: she is on metformin per primary care physician. 5. Hypertension. The patient is on amlodipine and losartan  per primary care physician.  6. Stable abdominal peripancreatic adenopathy without pancreatic mass. Stable from CT from 50/2012 to April 2013. This is a difficult area to biopsy percutaneously. Will follow-annually or sooner if she develops B symptoms or significantly worsened abdominal pain.  7. Follow up: Lab only in 2 months, 4months; Lab/RV with in 6 months.      The length of time of the face-to-face encounter was 15 minutes. More than 50% of time was spent counseling and coordination of care.

## 2011-12-20 NOTE — Telephone Encounter (Signed)
Gv pt appt for [id number]

## 2011-12-24 ENCOUNTER — Encounter (HOSPITAL_COMMUNITY): Payer: Medicare Other

## 2011-12-26 ENCOUNTER — Encounter (HOSPITAL_COMMUNITY): Payer: Medicare Other

## 2011-12-31 ENCOUNTER — Encounter (HOSPITAL_COMMUNITY): Payer: Medicare Other

## 2012-01-02 ENCOUNTER — Encounter (HOSPITAL_COMMUNITY): Payer: Medicare Other

## 2012-01-07 ENCOUNTER — Encounter (HOSPITAL_COMMUNITY): Payer: Medicare Other

## 2012-01-09 ENCOUNTER — Encounter (HOSPITAL_COMMUNITY): Payer: Medicare Other

## 2012-01-14 ENCOUNTER — Encounter (HOSPITAL_COMMUNITY): Payer: Medicare Other

## 2012-01-16 ENCOUNTER — Encounter (HOSPITAL_COMMUNITY): Payer: Medicare Other

## 2012-01-21 ENCOUNTER — Encounter (HOSPITAL_COMMUNITY): Payer: Medicare Other

## 2012-01-23 ENCOUNTER — Encounter (HOSPITAL_COMMUNITY): Payer: Medicare Other

## 2012-01-28 ENCOUNTER — Encounter (HOSPITAL_COMMUNITY): Payer: Medicare Other

## 2012-01-30 ENCOUNTER — Encounter (HOSPITAL_COMMUNITY): Payer: Medicare Other

## 2012-02-04 ENCOUNTER — Encounter (HOSPITAL_COMMUNITY): Payer: Medicare Other

## 2012-02-06 ENCOUNTER — Encounter (HOSPITAL_COMMUNITY): Payer: Medicare Other

## 2012-02-11 ENCOUNTER — Encounter (HOSPITAL_COMMUNITY): Payer: Medicare Other

## 2012-02-13 ENCOUNTER — Encounter (HOSPITAL_COMMUNITY): Payer: Medicare Other

## 2012-02-18 ENCOUNTER — Encounter (HOSPITAL_COMMUNITY): Payer: Medicare Other

## 2012-02-19 ENCOUNTER — Other Ambulatory Visit (HOSPITAL_BASED_OUTPATIENT_CLINIC_OR_DEPARTMENT_OTHER): Payer: Medicare Other | Admitting: Lab

## 2012-02-19 DIAGNOSIS — D500 Iron deficiency anemia secondary to blood loss (chronic): Secondary | ICD-10-CM

## 2012-02-19 DIAGNOSIS — D509 Iron deficiency anemia, unspecified: Secondary | ICD-10-CM

## 2012-02-19 LAB — CBC & DIFF AND RETIC
Basophils Absolute: 0 10*3/uL (ref 0.0–0.1)
Eosinophils Absolute: 0.1 10*3/uL (ref 0.0–0.5)
HCT: 34.2 % — ABNORMAL LOW (ref 34.8–46.6)
HGB: 10.9 g/dL — ABNORMAL LOW (ref 11.6–15.9)
Immature Retic Fract: 16.9 % — ABNORMAL HIGH (ref 1.60–10.00)
NEUT#: 2.9 10*3/uL (ref 1.5–6.5)
NEUT%: 57.1 % (ref 38.4–76.8)
RDW: 16.8 % — ABNORMAL HIGH (ref 11.2–14.5)
Retic Ct Abs: 95.34 10*3/uL — ABNORMAL HIGH (ref 33.70–90.70)
lymph#: 1.7 10*3/uL (ref 0.9–3.3)

## 2012-02-19 LAB — FERRITIN: Ferritin: 39 ng/mL (ref 10–291)

## 2012-02-20 ENCOUNTER — Encounter (HOSPITAL_COMMUNITY): Payer: Medicare Other

## 2012-02-25 ENCOUNTER — Encounter (HOSPITAL_COMMUNITY): Payer: Medicare Other

## 2012-02-27 ENCOUNTER — Encounter (HOSPITAL_COMMUNITY): Payer: Medicare Other

## 2012-03-03 ENCOUNTER — Encounter (HOSPITAL_COMMUNITY): Payer: Medicare Other

## 2012-03-05 ENCOUNTER — Encounter (HOSPITAL_COMMUNITY): Payer: Medicare Other

## 2012-03-10 ENCOUNTER — Encounter (HOSPITAL_COMMUNITY): Payer: Medicare Other

## 2012-03-12 ENCOUNTER — Encounter (HOSPITAL_COMMUNITY): Payer: Medicare Other

## 2012-03-17 ENCOUNTER — Encounter (HOSPITAL_COMMUNITY): Payer: Medicare Other

## 2012-03-19 ENCOUNTER — Encounter (HOSPITAL_COMMUNITY): Payer: Medicare Other

## 2012-03-24 ENCOUNTER — Encounter (HOSPITAL_COMMUNITY): Payer: Medicare Other

## 2012-03-26 ENCOUNTER — Encounter (HOSPITAL_COMMUNITY): Payer: Medicare Other

## 2012-03-31 ENCOUNTER — Encounter (HOSPITAL_COMMUNITY): Payer: Medicare Other

## 2012-04-02 ENCOUNTER — Encounter (HOSPITAL_COMMUNITY): Payer: Medicare Other

## 2012-04-07 ENCOUNTER — Encounter (HOSPITAL_COMMUNITY): Payer: Medicare Other

## 2012-04-09 ENCOUNTER — Encounter (HOSPITAL_COMMUNITY): Payer: Medicare Other

## 2012-04-14 ENCOUNTER — Encounter (HOSPITAL_COMMUNITY): Payer: Medicare Other

## 2012-04-15 ENCOUNTER — Telehealth: Payer: Self-pay | Admitting: Oncology

## 2012-04-15 NOTE — Telephone Encounter (Signed)
S.W. pt advised of appt time change....sed

## 2012-04-16 ENCOUNTER — Encounter (HOSPITAL_COMMUNITY): Payer: Medicare Other

## 2012-04-21 ENCOUNTER — Encounter (HOSPITAL_COMMUNITY): Payer: Medicare Other

## 2012-04-21 ENCOUNTER — Other Ambulatory Visit: Payer: Medicare Other | Admitting: Lab

## 2012-04-23 ENCOUNTER — Encounter (HOSPITAL_COMMUNITY): Payer: Medicare Other

## 2012-04-28 ENCOUNTER — Encounter (HOSPITAL_COMMUNITY): Payer: Medicare Other

## 2012-04-30 ENCOUNTER — Encounter (HOSPITAL_COMMUNITY): Payer: Medicare Other

## 2012-05-05 ENCOUNTER — Encounter (HOSPITAL_COMMUNITY): Payer: Medicare Other

## 2012-05-07 ENCOUNTER — Encounter (HOSPITAL_COMMUNITY): Payer: Medicare Other

## 2012-05-12 ENCOUNTER — Encounter (HOSPITAL_COMMUNITY): Payer: Medicare Other

## 2012-05-14 ENCOUNTER — Encounter (HOSPITAL_COMMUNITY): Payer: Medicare Other

## 2012-05-19 ENCOUNTER — Encounter (HOSPITAL_COMMUNITY): Payer: Medicare Other

## 2012-05-21 ENCOUNTER — Encounter (HOSPITAL_COMMUNITY): Payer: Medicare Other

## 2012-05-26 ENCOUNTER — Encounter (HOSPITAL_COMMUNITY): Payer: Medicare Other

## 2012-05-28 ENCOUNTER — Encounter (HOSPITAL_COMMUNITY): Payer: Medicare Other

## 2012-06-02 ENCOUNTER — Encounter (HOSPITAL_COMMUNITY): Payer: Medicare Other

## 2012-06-04 ENCOUNTER — Encounter (HOSPITAL_COMMUNITY): Payer: Medicare Other

## 2012-06-09 ENCOUNTER — Encounter (HOSPITAL_COMMUNITY): Payer: Medicare Other

## 2012-06-11 ENCOUNTER — Encounter (HOSPITAL_COMMUNITY): Payer: Medicare Other

## 2012-06-16 ENCOUNTER — Encounter (HOSPITAL_COMMUNITY): Payer: Medicare Other

## 2012-06-18 ENCOUNTER — Encounter (HOSPITAL_COMMUNITY): Payer: Medicare Other

## 2012-06-22 ENCOUNTER — Telehealth: Payer: Self-pay | Admitting: Oncology

## 2012-06-22 ENCOUNTER — Ambulatory Visit: Payer: Medicare Other | Admitting: Oncology

## 2012-06-22 ENCOUNTER — Other Ambulatory Visit: Payer: Medicare Other | Admitting: Lab

## 2012-06-22 NOTE — Telephone Encounter (Signed)
s.w. pt and rescheduled current appt to 12.2.13...Marland Kitchenpt aware

## 2012-06-23 ENCOUNTER — Encounter (HOSPITAL_COMMUNITY): Payer: Medicare Other

## 2012-06-25 ENCOUNTER — Encounter (HOSPITAL_COMMUNITY): Payer: Medicare Other

## 2012-06-29 ENCOUNTER — Ambulatory Visit: Payer: Medicare Other | Admitting: Oncology

## 2012-06-29 ENCOUNTER — Other Ambulatory Visit: Payer: Medicare Other | Admitting: Lab

## 2012-06-29 NOTE — Progress Notes (Signed)
Multiple no show.  Return prn.

## 2012-06-30 ENCOUNTER — Encounter (HOSPITAL_COMMUNITY): Payer: Medicare Other

## 2012-07-09 ENCOUNTER — Encounter: Payer: Self-pay | Admitting: Oncology

## 2012-07-09 NOTE — Progress Notes (Signed)
Pt called stating she needs assistance with medical bills.  Pt has ins which will pay 80% but she is struggling paying the 20%.  Pt applied for Medicaid without success.  I will call Medicaid for follow up.  Pt was approved for financial assistance but it expired on 12/21/11.  I will mail an EPP application today so she can reapply.

## 2012-09-01 ENCOUNTER — Telehealth: Payer: Self-pay | Admitting: Oncology

## 2012-09-01 NOTE — Telephone Encounter (Signed)
pt needed to r/s miss appt.Marland KitchenMarland KitchenMarland KitchenDone

## 2012-09-09 ENCOUNTER — Encounter: Payer: Self-pay | Admitting: Oncology

## 2012-09-09 ENCOUNTER — Ambulatory Visit (HOSPITAL_BASED_OUTPATIENT_CLINIC_OR_DEPARTMENT_OTHER): Payer: Medicare Other | Admitting: Oncology

## 2012-09-09 ENCOUNTER — Telehealth: Payer: Self-pay | Admitting: Oncology

## 2012-09-09 ENCOUNTER — Other Ambulatory Visit (HOSPITAL_BASED_OUTPATIENT_CLINIC_OR_DEPARTMENT_OTHER): Payer: Medicare Other | Admitting: Lab

## 2012-09-09 VITALS — BP 164/98 | HR 99 | Temp 96.8°F | Resp 22 | Ht <= 58 in | Wt 235.4 lb

## 2012-09-09 DIAGNOSIS — R599 Enlarged lymph nodes, unspecified: Secondary | ICD-10-CM

## 2012-09-09 DIAGNOSIS — E119 Type 2 diabetes mellitus without complications: Secondary | ICD-10-CM

## 2012-09-09 DIAGNOSIS — R5383 Other fatigue: Secondary | ICD-10-CM

## 2012-09-09 DIAGNOSIS — D500 Iron deficiency anemia secondary to blood loss (chronic): Secondary | ICD-10-CM

## 2012-09-09 DIAGNOSIS — D509 Iron deficiency anemia, unspecified: Secondary | ICD-10-CM

## 2012-09-09 DIAGNOSIS — I10 Essential (primary) hypertension: Secondary | ICD-10-CM

## 2012-09-09 LAB — COMPREHENSIVE METABOLIC PANEL (CC13)
ALT: 20 U/L (ref 0–55)
Alkaline Phosphatase: 98 U/L (ref 40–150)
CO2: 23 mEq/L (ref 22–29)
Creatinine: 1.2 mg/dL — ABNORMAL HIGH (ref 0.6–1.1)
Total Bilirubin: 0.36 mg/dL (ref 0.20–1.20)

## 2012-09-09 LAB — CBC WITH DIFFERENTIAL/PLATELET
Eosinophils Absolute: 0.1 10*3/uL (ref 0.0–0.5)
HGB: 10.6 g/dL — ABNORMAL LOW (ref 11.6–15.9)
MONO#: 0.7 10*3/uL (ref 0.1–0.9)
MONO%: 15.1 % — ABNORMAL HIGH (ref 0.0–14.0)
NEUT#: 2.7 10*3/uL (ref 1.5–6.5)
RBC: 5.35 10*6/uL (ref 3.70–5.45)
RDW: 20.6 % — ABNORMAL HIGH (ref 11.2–14.5)
WBC: 4.7 10*3/uL (ref 3.9–10.3)
lymph#: 1.2 10*3/uL (ref 0.9–3.3)

## 2012-09-09 LAB — IRON AND TIBC
%SAT: 6 % — ABNORMAL LOW (ref 20–55)
Iron: 27 ug/dL — ABNORMAL LOW (ref 42–145)
TIBC: 416 ug/dL (ref 250–470)
UIBC: 389 ug/dL (ref 125–400)

## 2012-09-09 LAB — FERRITIN: Ferritin: 24 ng/mL (ref 10–291)

## 2012-09-09 NOTE — Progress Notes (Signed)
[facility] OFFICE PROGRESS NOTE  CC: Cassandra Burns. Cassandra Howard, MD  Cassandra Burns, M.D.   DIAGNOSIS:  Microcytic anemia from iron deficiency anemia from most likely menometrorrhagia.  She had negative GI workup.   CURRENT THERAPY:  parenteral iron infusion prn. Her last IV [name] was in May 2012; transfusion for Hgb <7 for severe anemia.  INTERVAL HISTORY: Cassandra Burns 47 y.o. female returns for regular follow up by herself.  She has not been taking care of herself.  She let her diet and weight worsened, and consequently her Hgb A1C significantly increased. She has not been taking oral iron either.  She still has abdominal cramp.  She c/o nausea intermittently.  Her nausea is not related to eating, bowel movement.  She has intermittent hematochezia with negative GI work up in the past which was attributed to internal hemorrhoids.  She has mild to moderate fatigue.  This has not worsened compared to before.  She still is independent of all activities.    Patient denies fever, anorexia, headache, visual changes, confusion, drenching night sweats, palpable lymph node swelling, mucositis, odynophagia, dysphagia, nausea vomiting, jaundice, chest pain, palpitation, shortness of breath, dyspnea on exertion, productive cough, gum bleeding, epistaxis, hematemesis, hemoptysis, early satiety, melena, hematochezia, hematuria, skin rash, spontaneous bleeding, joint swelling, joint pain, heat or cold intolerance, bowel bladder incontinence, back pain, focal motor weakness, paresthesia, depression.     MEDICAL HISTORY: Past Medical History  Diagnosis Date  . Diabetes mellitus   . Hypertension   . Asthma   . Iron deficiency anemia due to chronic blood loss   . Menometrorrhagia   . Psoriasis     INTERIM HISTORY: has Menometrorrhagia; Iron deficiency anemia due to chronic blood loss; Hypertension; Community acquired pneumonia; DM (diabetes mellitus); Morbid obesity; and Nausea and vomiting  on her problem list.    ALLERGIES:  has No Known Allergies.  MEDICATIONS:  Current Outpatient Prescriptions  Medication Sig Dispense Refill  . albuterol (PROVENTIL) (2.5 MG/3ML) 0.083% nebulizer solution Take 2.5 mg by nebulization every 6 (six) hours as needed. For shortness of breath      . amLODipine (NORVASC) 5 MG tablet Take 5 mg by mouth 2 (two) times daily.        [name] loratadine (CLARITIN) 10 MG tablet Take 10 mg by mouth daily.        [name] losartan (COZAAR) 100 MG tablet Take 100 mg by mouth daily.        . Menthol, Topical Analgesic, (BIOFREEZE EX) Apply 1 application topically as needed. For stomach for pain.      . metFORMIN (GLUCOPHAGE) 1000 MG tablet Take 1,000 mg by mouth 2 (two) times daily with a meal.       No current facility-administered medications for this visit.    SURGICAL HISTORY: No past surgical history on file.  REVIEW OF SYSTEMS:  Pertinent items are noted in HPI.   PHYSICAL EXAMINATION: ECOG PERFORMANCE STATUS: 1  General:  Obese woman in no acute distress.  Eyes:  no scleral icterus.  ENT:  There were no oropharyngeal lesions.  Neck was without thyromegaly.  Lymphatics:  Negative cervical, supraclavicular or axillary adenopathy.  Respiratory: lungs were clear bilaterally without wheezing or crackles.  Cardiovascular:  Regular rate and rhythm, S1/S2, without murmur, rub or gallop.  There was no pedal edema.  GI:  abdomen was soft, obese, nontender, nondistended, without organomegaly.  Muscoloskeletal:  no spinal tenderness of palpation of vertebral spine.  Skin exam was without  echymosis, petichae.  Neuro exam was nonfocal.  Patient was able to get on and off exam table without assistance.  Gait was normal.  Patient was alerted and oriented.  Attention was good.   Language was appropriate.  Mood was normal without depression.  Speech was not pressured.  Thought content was not tangential.     Filed Vitals:   09/09/12 1026  BP: 164/98  Pulse: 99  Temp: 96.8 F  (36 C)  Resp: 22   Wt Readings from Last 3 Encounters:  09/09/12 235 lb 6.4 oz (106.777 kg)  12/20/11 218 lb 3.2 oz (98.975 kg)  11/08/11 209 lb 10.5 oz (95.1 kg)    LABORATORY/RADIOLOGY DATA: WBC 4.7; Hgb 10.6; Plt 263 LFT within normal limit; 10.4. Pending iron panel.   ASSESSMENT AND PLAN:    1. Iron-deficiency anemia. Most slightly due to menometrorrhagia. I advised her to resume SlowFe 325 mg PO BID along with VitC.  If her iron panel shows iron deficiency, I will recommend IV Feraheme again.  2. Hematochezia:  Negative work up with Dr. Elnoria Burns.  Most likely due to hemorrhoids.  3. Dysmenorrhagia. Per PCP.  4. Diabetes mellitus, type II:  she is on metformin per primary care physician. 5. Hypertension:  On amlodipine.  6. Stable abdominal peripancreatic adenopathy without pancreatic mass:  She has been having nausea and abdominal discomfort again.  I recommended follow up CT scan to ensure that her nodes have not progressed.  7. Follow up:  Lab only in 3, 6, and 9 months.  Lab/RV with me in 1 year.

## 2012-09-09 NOTE — Patient Instructions (Signed)
1.  Iron deficiency anemia:  Pending iron panel to see if IV iron is indicated.   Please continue to take oral iron as tolerated.  2.  History of borderline abdominal lymph node.  I recommend repeating CT abdomen around April 2014 to ensure that the abdominal lymph nodes have not significantly enlarged.  If so, we may need to refer to General Surgery for biopsy.  3.  Follow up:  Lab only appointment every 3 months.  Return visit in about 1 year.

## 2012-09-09 NOTE — Progress Notes (Signed)
Pt is approved for 100% financial assistance effective 09/09/12 - 03/09/13. I will mail approval letter and green card today.

## 2012-09-10 ENCOUNTER — Other Ambulatory Visit: Payer: Self-pay | Admitting: Oncology

## 2012-09-10 DIAGNOSIS — R5383 Other fatigue: Secondary | ICD-10-CM

## 2012-09-10 DIAGNOSIS — D500 Iron deficiency anemia secondary to blood loss (chronic): Secondary | ICD-10-CM

## 2012-09-14 ENCOUNTER — Encounter (HOSPITAL_COMMUNITY): Payer: Self-pay

## 2012-09-14 ENCOUNTER — Ambulatory Visit (HOSPITAL_COMMUNITY)
Admission: RE | Admit: 2012-09-14 | Discharge: 2012-09-14 | Disposition: A | Discharge status: Home / Self Care | Payer: Medicare Other | Source: Ambulatory Visit | Attending: Oncology | Admitting: Oncology

## 2012-09-14 DIAGNOSIS — J8410 Pulmonary fibrosis, unspecified: Secondary | ICD-10-CM | POA: Insufficient documentation

## 2012-09-14 DIAGNOSIS — I517 Cardiomegaly: Secondary | ICD-10-CM | POA: Insufficient documentation

## 2012-09-14 DIAGNOSIS — R599 Enlarged lymph nodes, unspecified: Secondary | ICD-10-CM | POA: Insufficient documentation

## 2012-09-14 DIAGNOSIS — K921 Melena: Secondary | ICD-10-CM | POA: Insufficient documentation

## 2012-09-14 DIAGNOSIS — I700 Atherosclerosis of aorta: Secondary | ICD-10-CM | POA: Insufficient documentation

## 2012-09-14 DIAGNOSIS — D649 Anemia, unspecified: Secondary | ICD-10-CM | POA: Insufficient documentation

## 2012-09-14 DIAGNOSIS — R112 Nausea with vomiting, unspecified: Secondary | ICD-10-CM | POA: Insufficient documentation

## 2012-09-14 MED ORDER — IOHEXOL 300 MG/ML  SOLN
100.0000 mL | Freq: Once | INTRAMUSCULAR | Status: AC | PRN
  Administered 2012-09-14: 100 mL via INTRAVENOUS

## 2012-09-16 ENCOUNTER — Encounter: Payer: Self-pay | Admitting: Oncology

## 2012-10-13 ENCOUNTER — Institutional Professional Consult (permissible substitution): Payer: Medicare Other | Admitting: Pulmonary Disease

## 2012-11-03 ENCOUNTER — Telehealth: Payer: Self-pay | Admitting: *Deleted

## 2012-11-03 ENCOUNTER — Other Ambulatory Visit (INDEPENDENT_AMBULATORY_CARE_PROVIDER_SITE_OTHER): Payer: Medicare Other

## 2012-11-03 ENCOUNTER — Ambulatory Visit (INDEPENDENT_AMBULATORY_CARE_PROVIDER_SITE_OTHER): Payer: Medicare Other | Admitting: Pulmonary Disease

## 2012-11-03 ENCOUNTER — Encounter: Payer: Self-pay | Admitting: Pulmonary Disease

## 2012-11-03 VITALS — BP 128/90 | HR 106 | Temp 98.7°F | Ht 59.0 in | Wt 233.2 lb

## 2012-11-03 DIAGNOSIS — R0600 Dyspnea, unspecified: Secondary | ICD-10-CM | POA: Insufficient documentation

## 2012-11-03 DIAGNOSIS — R0989 Other specified symptoms and signs involving the circulatory and respiratory systems: Secondary | ICD-10-CM

## 2012-11-03 DIAGNOSIS — R590 Localized enlarged lymph nodes: Secondary | ICD-10-CM

## 2012-11-03 DIAGNOSIS — R599 Enlarged lymph nodes, unspecified: Secondary | ICD-10-CM

## 2012-11-03 DIAGNOSIS — R0609 Other forms of dyspnea: Secondary | ICD-10-CM | POA: Insufficient documentation

## 2012-11-03 LAB — BASIC METABOLIC PANEL
Calcium: 9.8 mg/dL (ref 8.4–10.5)
Chloride: 104 mEq/L (ref 96–112)
Creatinine, Ser: 0.9 mg/dL (ref 0.4–1.2)

## 2012-11-03 NOTE — Progress Notes (Signed)
  Subjective:    Patient ID: Cassandra Burns, female    DOB: January 05, 1966, 47 y.o.   MRN: [id number]  HPI The patient is a 47 year old female who been asked to see for dyspnea on exertion.  The patient states she has had breathing issues for years, but feels it has gotten worse over the last one year.  She describes dyspnea on exertion at less than one block at a moderate pace on flat ground, and will get winded walking up one flight of stairs.  She will also get short of breath bringing groceries in from the car.  She does have a dry and hacking cough, but believes this is secondary to postnasal drip.  She denies frequent reflux symptoms.  She has never smoked cigarettes, but smokes marijuana on a daily basis.  She also has a history of chronic anemia, and is followed by hematology for this.  She has a history of lymphadenopathy dating back to 2006 while living in [place].  She isn't sure if she ever had a tissue diagnosis at [facility].  She had a CT of her chest last year that showed mediastinal lymphadenopathy, and a recent CT abdomen that showed abdominal lymphadenopathy with a minimal interstitial process in the lower lobes.  She has not had a followup CT chest.  She has not had pulmonary function studies since 2006.  The patient denies any significant constitutional symptoms, and has had no skin changes other than questionable eczema since living in [place].   Review of Systems  Constitutional: Negative for fever and unexpected weight change.  HENT: Positive for congestion and sore throat. Negative for ear pain, nosebleeds, rhinorrhea, sneezing, trouble swallowing, dental problem, postnasal drip and sinus pressure.   Eyes: Negative for redness and itching.  Respiratory: Positive for cough, chest tightness, shortness of breath and wheezing.   Cardiovascular: Positive for chest pain. Negative for palpitations and leg swelling.  Gastrointestinal: Negative for nausea and  vomiting.  Genitourinary: Negative for dysuria.  Musculoskeletal: Positive for joint swelling and arthralgias.  Skin: Negative for rash.  Neurological: Positive for headaches ( chronic).  Hematological: Does not bruise/bleed easily.  Psychiatric/Behavioral: Positive for dysphoric mood. The patient is not nervous/anxious.        Objective:   Physical Exam Constitutional:  Morbidly obese female no acute distress  HENT:  Nares patent without discharge  Oropharynx without exudate, palate and uvula are very thick and elongated.   Eyes:  Perrla, eomi, no scleral icterus  Neck:  No JVD, no TMG  Cardiovascular:  Mild tachy, regular rhythm, no rubs or gallops.  No murmurs        Intact distal pulses  Pulmonary :  Decreased bs due to poor depth of inspiration, no stridor or respiratory distress   No rales, rhonchi, or wheezing  Abdominal:  Soft, nondistended, bowel sounds present.  No tenderness noted.   Musculoskeletal:  1+ lower extremity edema noted.  Lymph Nodes:  No cervical lymphadenopathy noted  Skin:  No cyanosis noted  Neurologic:  Alert, appropriate, moves all 4 extremities without obvious deficit.         Assessment & Plan:

## 2012-11-03 NOTE — Telephone Encounter (Signed)
Records requested from [facility] have been received and placed in Sabastien Tyler folder for review.

## 2012-11-03 NOTE — Addendum Note (Signed)
Addended by: Nita Sells on: 11/03/2012 02:54 PM   Modules accepted: Orders

## 2012-11-03 NOTE — Patient Instructions (Addendum)
Will try and get your records from [facility] Will schedule for ct chest and breathing studies, and will arrange followup once the results are available.  Work on weight loss and conditioning.

## 2012-11-03 NOTE — Assessment & Plan Note (Signed)
This is most likely multifactorial, and primarily related to her morbid obesity and deconditioning.  She also has anemia, and it is unclear if the sarcoid has anything to do with her current symptoms.  Finally, it is unknown if she has had any type of cardiac evaluation such as echocardiogram or stress testing.

## 2012-11-03 NOTE — Addendum Note (Signed)
Addended by: Nita Sells on: 11/03/2012 02:35 PM   Modules accepted: Orders

## 2012-11-03 NOTE — Assessment & Plan Note (Addendum)
The patient has a history of mediastinal and abdominal lymphadenopathy that dates back to 2006 while living in [place].  The patient is unsure if she has ever had tissue diagnosis, but has been given the diagnosis of sarcoidosis.  At this point, it is very important to have an accurate diagnosis, and we'll try and obtain records from [facility].  If she has never had tissue diagnosis, this will need to be done.  Even if she does have sarcoid, it is unclear how much this is contributing to her symptoms of dyspnea on exertion.  Will schedule for followup CT chest and pulmonary function studies since she has had worsening shortness of breath over the last one year.

## 2012-11-06 ENCOUNTER — Ambulatory Visit (HOSPITAL_COMMUNITY)
Admission: RE | Admit: 2012-11-06 | Discharge: 2012-11-06 | Disposition: A | Discharge status: Home / Self Care | Payer: Medicare Other | Source: Ambulatory Visit | Attending: Pulmonary Disease | Admitting: Pulmonary Disease

## 2012-11-06 ENCOUNTER — Ambulatory Visit (INDEPENDENT_AMBULATORY_CARE_PROVIDER_SITE_OTHER)
Admission: RE | Admit: 2012-11-06 | Discharge: 2012-11-06 | Disposition: A | Discharge status: Home / Self Care | Payer: Medicare Other | Source: Ambulatory Visit | Attending: Pulmonary Disease | Admitting: Pulmonary Disease

## 2012-11-06 DIAGNOSIS — R599 Enlarged lymph nodes, unspecified: Secondary | ICD-10-CM

## 2012-11-06 DIAGNOSIS — R0609 Other forms of dyspnea: Secondary | ICD-10-CM | POA: Insufficient documentation

## 2012-11-06 DIAGNOSIS — R0989 Other specified symptoms and signs involving the circulatory and respiratory systems: Secondary | ICD-10-CM | POA: Insufficient documentation

## 2012-11-06 DIAGNOSIS — R590 Localized enlarged lymph nodes: Secondary | ICD-10-CM

## 2012-11-06 DIAGNOSIS — R0600 Dyspnea, unspecified: Secondary | ICD-10-CM

## 2012-11-06 DIAGNOSIS — R059 Cough, unspecified: Secondary | ICD-10-CM | POA: Insufficient documentation

## 2012-11-06 DIAGNOSIS — R05 Cough: Secondary | ICD-10-CM | POA: Insufficient documentation

## 2012-11-06 MED ORDER — IOHEXOL 300 MG/ML  SOLN
80.0000 mL | Freq: Once | INTRAMUSCULAR | Status: AC | PRN
  Administered 2012-11-06: 80 mL via INTRAVENOUS

## 2012-11-06 MED ORDER — ALBUTEROL SULFATE (5 MG/ML) 0.5% IN NEBU
2.5000 mg | INHALATION_SOLUTION | Freq: Once | RESPIRATORY_TRACT | Status: AC
  Administered 2012-11-06: 2.5 mg via RESPIRATORY_TRACT

## 2012-11-09 ENCOUNTER — Encounter: Payer: Self-pay | Admitting: Pulmonary Disease

## 2012-11-10 ENCOUNTER — Telehealth: Payer: Self-pay | Admitting: *Deleted

## 2012-11-10 NOTE — Telephone Encounter (Signed)
Pt returned call to speak w/ Ashtyn.  Pt advised that she will not be available for an appt tomorrow & would like to keep original date/time.  Antionette Fairy

## 2012-11-10 NOTE — Telephone Encounter (Signed)
Patient to be calling back to schedule earlier appt with KC. Pt currently scheduled for 4/24 to follow up and review PFT...KC would like pt to come sooner. Appt has been held for tomorrow 4/16 at 930 for patient---she is returning call today to let me know if this date/time okay.  If not please DELETE the hold. Thanks.

## 2012-11-10 NOTE — Telephone Encounter (Signed)
I will let Cassandra Burns know. Will keep the original date and time. Patient is aware that [facility] wanted to see her sooner. Still would not reschedule.

## 2012-11-19 ENCOUNTER — Encounter: Payer: Self-pay | Admitting: Pulmonary Disease

## 2012-11-19 ENCOUNTER — Ambulatory Visit (INDEPENDENT_AMBULATORY_CARE_PROVIDER_SITE_OTHER): Payer: Medicare Other | Admitting: Pulmonary Disease

## 2012-11-19 VITALS — BP 138/88 | HR 103 | Temp 98.1°F | Ht 59.0 in | Wt 231.6 lb

## 2012-11-19 DIAGNOSIS — R0600 Dyspnea, unspecified: Secondary | ICD-10-CM

## 2012-11-19 DIAGNOSIS — G4733 Obstructive sleep apnea (adult) (pediatric): Secondary | ICD-10-CM

## 2012-11-19 DIAGNOSIS — R0989 Other specified symptoms and signs involving the circulatory and respiratory systems: Secondary | ICD-10-CM

## 2012-11-19 DIAGNOSIS — R590 Localized enlarged lymph nodes: Secondary | ICD-10-CM

## 2012-11-19 DIAGNOSIS — R599 Enlarged lymph nodes, unspecified: Secondary | ICD-10-CM

## 2012-11-19 NOTE — Patient Instructions (Addendum)
Will get your bipap supplies updated, and recheck your pressure on a loaner auto device.  I will call you once I receive the download. Will check your oxygen level overnight once we update your bilevel device. Schedule for echo of your heart to re-evaluate the pressures in the blood vessels of your lungs and heart. Work on weight loss It is unclear if you have sarcoid or not, but if you do, I do not think it is contributing a lot to your current symptoms.  Will arrange followup with me once some of the data is available.

## 2012-11-19 NOTE — Progress Notes (Signed)
  Subjective:    Patient ID: Cassandra Burns, female    DOB: Oct 02, 1965, 47 y.o.   MRN: [id number]  HPI The patient comes in today for followup after her recent PFTs.  They showed no evidence for airflow obstruction, moderate restriction, and a severe decrease in diffusion capacity that corrected to normal with alveolar volume adjustment.  I have reviewed the study with her in detail, and also reviewed all of the records from [facility] with her as well.   Review of Systems  Constitutional: Negative for fever and unexpected weight change.  HENT: Negative for ear pain, nosebleeds, congestion, sore throat, rhinorrhea, sneezing, trouble swallowing, dental problem, postnasal drip and sinus pressure.   Eyes: Negative for redness and itching.  Respiratory: Positive for shortness of breath. Negative for cough, chest tightness and wheezing.   Cardiovascular: Negative for palpitations and leg swelling.  Gastrointestinal: Negative for nausea and vomiting.  Genitourinary: Negative for dysuria.  Musculoskeletal: Negative for joint swelling.  Skin: Negative for rash.  Neurological: Negative for headaches.  Hematological: Does not bruise/bleed easily.  Psychiatric/Behavioral: Negative for dysphoric mood. The patient is not nervous/anxious.        Objective:   Physical Exam Morbidly obese female in no acute distress Nose without purulence or discharge noted No skin breakdown or pressure necrosis from the CPAP mask Neck without lymphadenopathy or thyromegaly Chest totally clear to auscultation, no wheezing Cardiac exam with regular rate and rhythm Lower extremities with edema noted, no cyanosis Lower, and does not appear to be sleepy, moves all 4 extremities.       Assessment & Plan:

## 2012-11-20 NOTE — Assessment & Plan Note (Signed)
The patient has significant dyspnea on exertion that I suspect is multifactorial.  After reviewing her records from [place], and also her data here, I suspect that her breathing issues are related to pulmonary hypertension, obstructive sleep apnea with obesity hypoventilation, and finally her morbid obesity and deconditioning.  The pulmonary group in [place] felt that her dyspnea on exertion was out of proportion to her objective data.  I do not have the full PFT data from [place] to compare to her current numbers, but her FEV1 is actually improved.  She also has significant restriction related to her centripetal obesity.  At this point, I think we need to repeat her echocardiogram to look for worsening pulmonary hypertension, and I have also stressed to her the importance of making sure her BiPAP is adjusted appropriately and her oxygen saturations are being maintained at night. I have also stressed to her the importance of aggressive weight loss and conditioning.

## 2012-11-20 NOTE — Assessment & Plan Note (Signed)
The patient has had bronchoscopy with biopsy, and it was nondiagnostic in 2008.  Her current chest CT is essentially unchanged from her prior.  It is unclear if she has sarcoidosis or not, but I believe it has nothing to do with her current symptoms.  She obviously doesn't have mediastinal lymphadenopathy from lymphoma.  At this point, like to continue monitoring her x-ray over time.

## 2012-11-24 ENCOUNTER — Ambulatory Visit (HOSPITAL_COMMUNITY): Payer: Medicare Other | Attending: Pulmonary Disease

## 2012-11-24 ENCOUNTER — Other Ambulatory Visit (HOSPITAL_COMMUNITY): Payer: Self-pay | Admitting: Cardiology

## 2012-11-24 DIAGNOSIS — I2789 Other specified pulmonary heart diseases: Secondary | ICD-10-CM | POA: Insufficient documentation

## 2012-11-24 DIAGNOSIS — R079 Chest pain, unspecified: Secondary | ICD-10-CM | POA: Insufficient documentation

## 2012-11-24 DIAGNOSIS — R0609 Other forms of dyspnea: Secondary | ICD-10-CM | POA: Insufficient documentation

## 2012-11-24 DIAGNOSIS — R0989 Other specified symptoms and signs involving the circulatory and respiratory systems: Secondary | ICD-10-CM

## 2012-11-24 DIAGNOSIS — R0602 Shortness of breath: Secondary | ICD-10-CM

## 2012-11-24 DIAGNOSIS — F1210 Cannabis abuse, uncomplicated: Secondary | ICD-10-CM | POA: Insufficient documentation

## 2012-11-24 DIAGNOSIS — R0600 Dyspnea, unspecified: Secondary | ICD-10-CM

## 2012-11-24 MED ORDER — PERFLUTREN PROTEIN A MICROSPH IV SUSP
2.0000 mL | Freq: Once | INTRAVENOUS | Status: AC
  Administered 2012-11-24: 2 mL via INTRAVENOUS

## 2012-11-24 NOTE — Progress Notes (Signed)
Echocardiogram performed.  

## 2012-12-01 ENCOUNTER — Telehealth: Payer: Self-pay | Admitting: Pulmonary Disease

## 2012-12-01 NOTE — Telephone Encounter (Signed)
See message below--pt has not received call from DME to set up ordered tests per [facility].   Pt states that [employer] has not called her in regards to setting her up on Auto device, new BiPap supplies or the ONO.   According to 11/19/12 OV note:  Will get your bipap supplies updated, and recheck your pressure on a loaner auto device. I will call you once I receive the download. Will check your oxygen level overnight once we update your bilevel device.   [facility] please advise. Thanks.

## 2012-12-02 NOTE — Telephone Encounter (Signed)
Spoke to [email or web address]  she has order and will make sure to call pt today Cassandra Burns

## 2012-12-07 ENCOUNTER — Other Ambulatory Visit (HOSPITAL_BASED_OUTPATIENT_CLINIC_OR_DEPARTMENT_OTHER): Payer: Medicare Other

## 2012-12-07 DIAGNOSIS — D500 Iron deficiency anemia secondary to blood loss (chronic): Secondary | ICD-10-CM

## 2012-12-07 DIAGNOSIS — D509 Iron deficiency anemia, unspecified: Secondary | ICD-10-CM

## 2012-12-07 LAB — CBC WITH DIFFERENTIAL/PLATELET
BASO%: 0.4 % (ref 0.0–2.0)
HCT: 28.9 % — ABNORMAL LOW (ref 34.8–46.6)
HGB: 8.9 g/dL — ABNORMAL LOW (ref 11.6–15.9)
MCHC: 30.8 g/dL — ABNORMAL LOW (ref 31.5–36.0)
MONO#: 0.5 10*3/uL (ref 0.1–0.9)
NEUT%: 67.1 % (ref 38.4–76.8)
WBC: 4.4 10*3/uL (ref 3.9–10.3)
lymph#: 0.8 10*3/uL — ABNORMAL LOW (ref 0.9–3.3)

## 2012-12-08 ENCOUNTER — Telehealth: Payer: Self-pay | Admitting: Pulmonary Disease

## 2012-12-08 NOTE — Telephone Encounter (Signed)
ATC patient, no answer unable to leave msg received msg that patient has not set up her voicemail  Will call back

## 2012-12-08 NOTE — Telephone Encounter (Signed)
Patient returning call.

## 2012-12-08 NOTE — Telephone Encounter (Signed)
I called [employer] and was advised they will look into pt chart and see what the hold up is. They advised me they will give the pt a call back. I called and made pt aware of this. She voiced her understanding and needed nothing further.

## 2012-12-11 ENCOUNTER — Encounter: Payer: Self-pay | Admitting: Oncology

## 2012-12-11 ENCOUNTER — Other Ambulatory Visit: Payer: Self-pay | Admitting: Oncology

## 2012-12-11 ENCOUNTER — Telehealth: Payer: Self-pay | Admitting: Oncology

## 2012-12-11 NOTE — Telephone Encounter (Signed)
, °

## 2012-12-14 ENCOUNTER — Telehealth: Payer: Self-pay | Admitting: *Deleted

## 2012-12-14 NOTE — Telephone Encounter (Signed)
Per staff message and POF I have scheduled appts.  JMW  

## 2012-12-15 ENCOUNTER — Telehealth: Payer: Self-pay | Admitting: Oncology

## 2012-12-18 ENCOUNTER — Encounter (HOSPITAL_COMMUNITY): Payer: Self-pay | Admitting: *Deleted

## 2012-12-18 ENCOUNTER — Telehealth: Payer: Self-pay | Admitting: Pulmonary Disease

## 2012-12-18 ENCOUNTER — Ambulatory Visit (HOSPITAL_BASED_OUTPATIENT_CLINIC_OR_DEPARTMENT_OTHER): Payer: Medicare Other

## 2012-12-18 ENCOUNTER — Ambulatory Visit (HOSPITAL_COMMUNITY)
Admission: AD | Admit: 2012-12-18 | Discharge: 2012-12-18 | Disposition: A | Discharge status: Home / Self Care | Payer: 59 | Attending: Psychiatry | Admitting: Psychiatry

## 2012-12-18 VITALS — BP 141/91 | HR 100 | Temp 97.3°F

## 2012-12-18 DIAGNOSIS — F410 Panic disorder [episodic paroxysmal anxiety] without agoraphobia: Secondary | ICD-10-CM | POA: Insufficient documentation

## 2012-12-18 DIAGNOSIS — F332 Major depressive disorder, recurrent severe without psychotic features: Secondary | ICD-10-CM | POA: Insufficient documentation

## 2012-12-18 DIAGNOSIS — D500 Iron deficiency anemia secondary to blood loss (chronic): Secondary | ICD-10-CM

## 2012-12-18 DIAGNOSIS — D509 Iron deficiency anemia, unspecified: Secondary | ICD-10-CM

## 2012-12-18 DIAGNOSIS — E669 Obesity, unspecified: Secondary | ICD-10-CM

## 2012-12-18 HISTORY — DX: Obesity, unspecified: E66.9

## 2012-12-18 HISTORY — DX: Sleep apnea, unspecified: G47.30

## 2012-12-18 MED ORDER — FERUMOXYTOL INJECTION 510 MG/17 ML
1020.0000 mg | Freq: Once | INTRAVENOUS | Status: AC
  Administered 2012-12-18: 1020 mg via INTRAVENOUS
  Filled 2012-12-18: qty 34

## 2012-12-18 MED ORDER — ALTEPLASE 2 MG IJ SOLR
2.0000 mg | Freq: Once | INTRAMUSCULAR | Status: DC | PRN
  Filled 2012-12-18: qty 2

## 2012-12-18 MED ORDER — SODIUM CHLORIDE 0.9 % IV SOLN
Freq: Once | INTRAVENOUS | Status: AC
  Administered 2012-12-18: 09:00:00 via INTRAVENOUS
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
## 2012-12-18 NOTE — H&P (Signed)
Behavioral Health Medical Screening Exam  Cassandra Burns is an 47 y.o. female.  Review of Systems  Constitutional: Negative.   HENT: Negative.   Eyes:       Wears eye glasses   Respiratory: Negative.   Cardiovascular: Negative.   Gastrointestinal: Negative.   Genitourinary: Negative.   Musculoskeletal: Negative.   Skin: Negative.   Neurological: Negative.   Endo/Heme/Allergies: Negative.   Psychiatric/Behavioral: Positive for depression (Rated at #10) and substance abuse (Hx Cocaine and marijuana abuse). Negative for suicidal ideas, hallucinations and memory loss. The patient is not nervous/anxious (Rated at #8) and does not have insomnia.     Physical Exam  Constitutional: She is oriented to person, place, and time. She appears well-developed.  Obese  HENT:  Head: Normocephalic.  Eyes: Pupils are equal, round, and reactive to light.  Neck: Normal range of motion.  Cardiovascular: Normal rate.   Respiratory: Effort normal.  GI: Soft.  Musculoskeletal: Normal range of motion.  Neurological: She is alert and oriented to person, place, and time.  Skin: Skin is warm and dry.  Psychiatric: Her speech is normal and behavior is normal. Judgment and thought content normal. Her mood appears anxious (Rated at #8). Cognition and memory are normal. She exhibits a depressed mood (Rated at #10).    There were no vitals taken for this visit.  Recommendations: CDIOP with the [facility].  Based on my evaluation the patient does not appear to have an emergency medical condition. that will require inpatient hospitalization and or a trip to the ED. However, she is currently being referred to with an appointment to start CDIOP at the [facility] outpatient clinic on the upcoming Thursday (12/24/12). She is currently denying any suicidal/homicidal ideations, auditory/visual hallucinations, paranoia and or delusions. She is being discharged to her home with 48 year old pregnant daughter. Patient is able to  contract for safety with daughter cosigning after her mother that to the best of her knowledge of her mother that she (her mother) is not at risks for self harm, suicide and or homicidal. Patient is excited and looking forward to the birth of her grand-daughter in the next few months. Although she is not receiving any prescription for an antidepressant at this time, there is the likely hood that she will be started on one when she sees her outpatient psychiatrist at the CDIOP. Her progress and follow-up care will be monitored on an outpatient basis. Patient left [facility] in no apparent distress via family transport.  Sanjuana Kava, PMHNP 12/18/2012, 12:34 PM

## 2012-12-18 NOTE — Patient Instructions (Signed)
Ferumoxytol injection What is this medicine? FERUMOXYTOL is an iron complex. Iron is used to make healthy red blood cells, which carry oxygen and nutrients throughout the body. This medicine is used to treat iron deficiency anemia in people with chronic kidney disease. This medicine may be used for other purposes; ask your health care provider or pharmacist if you have questions. What should I tell my health care provider before I take this medicine? They need to know if you have any of these conditions: -anemia not caused by low iron levels -high levels of iron in the blood -magnetic resonance imaging (MRI) test scheduled -an unusual or allergic reaction to iron, other medicines, foods, dyes, or preservatives -pregnant or trying to get pregnant -breast-feeding How should I use this medicine? This medicine is for infusion into a vein. It is given by a health care professional in a hospital or clinic setting. Talk to your pediatrician regarding the use of this medicine in children. Special care may be needed. Overdosage: If you think you've taken too much of this medicine contact a poison control center or emergency room at once. Overdosage: If you think you have taken too much of this medicine contact a poison control center or emergency room at once. NOTE: This medicine is only for you. Do not share this medicine with others. What if I miss a dose? It is important not to miss your dose. Call your doctor or health care professional if you are unable to keep an appointment. What may interact with this medicine? This medicine may interact with the following medications: -other iron products This list may not describe all possible interactions. Give your health care provider a list of all the medicines, herbs, non-prescription drugs, or dietary supplements you use. Also tell them if you smoke, drink alcohol, or use illegal drugs. Some items may interact with your medicine. What should I watch  for while using this medicine? Visit your doctor or healthcare professional regularly. Tell your doctor or healthcare professional if your symptoms do not start to get better or if they get worse. You may need blood work done while you are taking this medicine. You may need to follow a special diet. Talk to your doctor. Foods that contain iron include: whole grains/cereals, dried fruits, beans, or peas, leafy green vegetables, and organ meats (liver, kidney). What side effects may I notice from receiving this medicine? Side effects that you should report to your doctor or health care professional as soon as possible: -allergic reactions like skin rash, itching or hives, swelling of the face, lips, or tongue -breathing problems -changes in blood pressure -feeling faint or lightheaded, falls -fever or chills -flushing, sweating, or hot feelings -swelling of the ankles or feet Side effects that usually do not require medical attention (Report these to your doctor or health care professional if they continue or are bothersome.): -diarrhea -headache -nausea, vomiting -stomach pain This list may not describe all possible side effects. Call your doctor for medical advice about side effects. You may report side effects to FDA at 1-800-FDA-1088. Where should I keep my medicine? This drug is given in a hospital or clinic and will not be stored at home. NOTE: This sheet is a summary. It may not cover all possible information. If you have questions about this medicine, talk to your doctor, pharmacist, or health care provider.  2013, [employer]. (04/06/2008 9:48:25 PM)  

## 2012-12-18 NOTE — Progress Notes (Signed)
Patient observed for 30 minutes post feraheme infusion. Patient has no complaints. Patient discharged home ambulatory.

## 2012-12-18 NOTE — Telephone Encounter (Signed)
Spoke to pt. Advised her that I don't see anything in her chart that shows that we called her yesterday. She verbalized understanding and just wanted to maker sure we didn't need to talk to her. Nothing further was needed.

## 2012-12-24 ENCOUNTER — Encounter (HOSPITAL_COMMUNITY): Payer: Self-pay

## 2012-12-24 ENCOUNTER — Other Ambulatory Visit (HOSPITAL_COMMUNITY): Payer: Medicare Other | Attending: Psychiatry | Admitting: Psychiatry

## 2012-12-24 DIAGNOSIS — F32A Depression, unspecified: Secondary | ICD-10-CM

## 2012-12-24 DIAGNOSIS — F332 Major depressive disorder, recurrent severe without psychotic features: Secondary | ICD-10-CM | POA: Insufficient documentation

## 2012-12-24 DIAGNOSIS — F329 Major depressive disorder, single episode, unspecified: Secondary | ICD-10-CM | POA: Insufficient documentation

## 2012-12-24 DIAGNOSIS — F410 Panic disorder [episodic paroxysmal anxiety] without agoraphobia: Secondary | ICD-10-CM

## 2012-12-24 MED ORDER — HYDROXYZINE PAMOATE 50 MG PO CAPS: 50.0000 mg | ORAL_CAPSULE | Freq: Three times a day (TID) | ORAL | Status: DC | PRN

## 2012-12-24 MED ORDER — FLUOXETINE HCL 20 MG PO CAPS: 20.0000 mg | ORAL_CAPSULE | Freq: Every day | ORAL | Status: DC
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
## 2012-12-24 NOTE — Progress Notes (Signed)
    Daily Group Progress Note  Program: IOP  Group Time: 9:00-10:30 am   Participation Level: Active  Behavioral Response: Appropriate  Type of Therapy:  Process Group  Summary of Progress: Patient participated in an education segment on using meditation to manage anxiety and depression symptoms.        Group Time: 10:30 am - 12:00 pm   Participation Level:  Active  Behavioral Response: Appropriate  Type of Therapy: Psycho-education Group  Summary of Progress: Patient practiced a guided meditation that allowed Pt to implement the skill of meditation and then discussed the benefits to reducing anxiety symptoms.    Emiko Osorto E, LCSW 

## 2012-12-25 ENCOUNTER — Other Ambulatory Visit (HOSPITAL_COMMUNITY): Payer: Medicare Other | Admitting: Psychiatry

## 2012-12-25 DIAGNOSIS — F329 Major depressive disorder, single episode, unspecified: Secondary | ICD-10-CM

## 2012-12-25 DIAGNOSIS — F32A Depression, unspecified: Secondary | ICD-10-CM

## 2012-12-25 NOTE — Progress Notes (Signed)
    Daily Group Progress Note  Program: IOP  Group Time: 9:00-10:30 am   Participation Level: Active  Behavioral Response: Appropriate  Type of Therapy:  Process Group  Summary of Progress: Pt shared her current stressors and received support. Pt states she struggles in her current romantic relationship that she has been in for the past year and a half because she does not trust him and this leads to depression symptoms. Pt states he cheated on her early on in the relationship and this causes tension. Pt is unsure what to do about it and so it continues to cause stress. Pt also worries about her daughter, son and niece who are all expecting babies this Fall and how that will impact pts stress level. Pt also talked about still grieving the death of her mother from several years ago.      Group Time: 10:30 am - 12:00 pm    Participation Level:  Active  Behavioral Response: Appropriate  Type of Therapy: Psycho-education Group  Summary of Progress: Pt learned about anxiety disorders, the signs, symptoms and treatments and explored one thing they could do each day to manage anxiety symptoms.   Carman Ching, LCSW

## 2012-12-28 ENCOUNTER — Other Ambulatory Visit (HOSPITAL_COMMUNITY): Payer: Medicare Other | Admitting: Psychiatry

## 2012-12-28 DIAGNOSIS — F332 Major depressive disorder, recurrent severe without psychotic features: Secondary | ICD-10-CM | POA: Insufficient documentation

## 2012-12-28 DIAGNOSIS — F329 Major depressive disorder, single episode, unspecified: Secondary | ICD-10-CM

## 2012-12-28 DIAGNOSIS — F410 Panic disorder [episodic paroxysmal anxiety] without agoraphobia: Secondary | ICD-10-CM | POA: Insufficient documentation

## 2012-12-28 DIAGNOSIS — F32A Depression, unspecified: Secondary | ICD-10-CM

## 2012-12-28 NOTE — Progress Notes (Signed)
    Daily Group Progress Note  Program: IOP  Group Time: 9:00-10:30 am   Participation Level: Active  Behavioral Response: Appropriate  Type of Therapy:  Process Group  Summary of Progress: Pt states she has moderate anxiety and depression today. She was quiet and reserved and only made a few general comments to other members when she shared. Pt was encouraged to talk in the second group and share how she is doing. Pt is working on managing stress in her personal relationship and within her family that is contributing to feelings of depression.      Group Time: 10:30 am - 12:00 pm   Participation Level:  Active  Behavioral Response: Appropriate  Type of Therapy: Psycho-education Group  Summary of Progress: Pt participated in a group on grief and loss and identified how current losses are impacting depression and identified effective ways to grieve.   Carman Ching, LCSW

## 2012-12-29 ENCOUNTER — Other Ambulatory Visit (HOSPITAL_COMMUNITY): Payer: Medicare Other | Admitting: Psychiatry

## 2012-12-29 DIAGNOSIS — F32A Depression, unspecified: Secondary | ICD-10-CM

## 2012-12-29 DIAGNOSIS — F329 Major depressive disorder, single episode, unspecified: Secondary | ICD-10-CM

## 2012-12-30 ENCOUNTER — Other Ambulatory Visit (HOSPITAL_COMMUNITY): Payer: Medicare Other | Admitting: Psychiatry

## 2012-12-30 DIAGNOSIS — F329 Major depressive disorder, single episode, unspecified: Secondary | ICD-10-CM

## 2012-12-30 DIAGNOSIS — F32A Depression, unspecified: Secondary | ICD-10-CM

## 2012-12-30 NOTE — Progress Notes (Signed)
    Daily Group Progress Note  Program: IOP  Group Time: 9:00-10:30 am   Participation Level: Active  Behavioral Response: Appropriate  Type of Therapy:  Process Group  Summary of Progress: Pt was wearing  Makeup and had her hair styled and was dressed up. Pt states her mood is improving and she has the energy to put into herself again. Pt was smiling and encouraging others to share. Pt talked about how she is learning healthy ways to grieve the death of her mother and coping skills to manage feeling overwhelmed.     Group Time: 10:30 am - 12:00 pm   Participation Level:  Active  Behavioral Response: Appropriate  Type of Therapy: Psycho-education Group  Summary of Progress: Pt learned about the symptoms of depression and the importance of understanding and recognizing the symptoms to manage depression more effectively.  Carman Ching, LCSW

## 2012-12-30 NOTE — Progress Notes (Signed)
    Daily Group Progress Note  Program: IOP  Group Time: 9:00-10:30 am   Participation Level: Active  Behavioral Response: Appropriate  Type of Therapy:  Process Group  Summary of Progress: Pt continues to report improved mood and is dressing up and wearing make-up. Pt is responding well to the supportive environment and encouragement. It appears to be increasing her self esteem and giving her the ability to demand respect from others. Pt is still uncertain about how to proceed with her current relationship with the man who has cheated on her, but she is gaining strength through he group process.      Group Time: 10:30 am - 12:00 pm   Participation Level:  Active  Behavioral Response: Appropriate  Type of Therapy: Psycho-education Group  Summary of Progress: Pt was introduced to the DBT Distress Tolerance skill of "if you can fix it fix it" and explored unhealthy ways of coping with stress that can lead to increased stress in other areas.    Carman Ching, LCSW

## 2012-12-31 ENCOUNTER — Telehealth (HOSPITAL_COMMUNITY): Payer: Self-pay | Admitting: Psychiatry

## 2012-12-31 ENCOUNTER — Other Ambulatory Visit (HOSPITAL_COMMUNITY): Payer: Medicare Other
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
## 2013-01-01 ENCOUNTER — Other Ambulatory Visit (HOSPITAL_COMMUNITY): Payer: Medicare Other | Admitting: Psychiatry

## 2013-01-01 DIAGNOSIS — F32A Depression, unspecified: Secondary | ICD-10-CM

## 2013-01-01 DIAGNOSIS — F329 Major depressive disorder, single episode, unspecified: Secondary | ICD-10-CM

## 2013-01-01 NOTE — Progress Notes (Signed)
    Daily Group Progress Note  Program: IOP  Group Time: 9:00-10:30 am   Participation Level: Active  Behavioral Response: Appropriate  Type of Therapy:  Process Group  Summary of Progress: Pt presents with increased depression after missing group yesterday. She was back to wearing sweat pants without makeup and hair was not styled. Pt appears to do well when she is in structured settign of support. Pt states she misses work and currently does not feel her life has purpose. Pt talked about not having things to fill her time that make her happy.      Group Time: 10:30 am - 12:00 pm   Participation Level:  Active  Behavioral Response: Appropriate  Type of Therapy: Psycho-education Group  Summary of Progress: Pt learned the DBT skills of distress tolerance in Improving the moment and self soothing to reduce and mange stress.   Carman Ching, LCSW

## 2013-01-01 NOTE — Progress Notes (Deleted)
Cassandra Burns is a 47 y.o. female in treatment for *** and displays the following risk factors for Suicide:  Demographic factors:  {CHL AMB BH Suicide Demographics:21022747:a} Current Mental Status: {CHL AMB BH Suicide Current Mental Status:21022748:a} Loss Factors: {CHL AMB BH Suicide Loss Factors:21022749:a} Historical Factors: {CHL AMB BH Suicide Historical Factors:21022750:a} Risk Reduction Factors: {CHL AMB BH Suicide Risk Reduction Factors:21022751:a}  CLINICAL FACTORS:  {Clinical Factors:22706}  COGNITIVE FEATURES THAT CONTRIBUTE TO RISK: {Cognitive Features:22703}    SUICIDE RISK:  {BHH SUICIDE RISK:22704}  Mental Status: *** General Appearance /Behavior:  {BHH GENERAL APPEARANCE/BEHAVIOR:22300} Eye Contact:  {BHH EYE CONTACT:22301} Motor Behavior:  {BHH MOTOR BEHAVIOR:22302} Speech:  {BHH SPEECH:22304} Level of Consciousness:  {BHH LEVEL OF CONSCIOUSNESS:22305} Mood:  {BHH MOOD:22306} Affect:  {BHH AFFECT:22307} Anxiety Level:  {BHH ANXIETY LEVEL:22308} Thought Process:  {BHH THOUGHT PROCESS:22309} Thought Content:  {BHH THOUGHT CONTENT:22310} Perception:  {BHH PERCEPTION:22311} Judgment:  {BHH JUDGMENT:22312} Insight:  {BHH INSIGHT:22313} Cognition:  {BHH COGNITION:22314} Sleep: ***  PLAN OF CARE: ***   Carman Ching, LCSW 01/01/2013, 12:50 PM

## 2013-01-04 ENCOUNTER — Other Ambulatory Visit (HOSPITAL_COMMUNITY): Payer: Medicare Other

## 2013-01-04 ENCOUNTER — Telehealth (HOSPITAL_COMMUNITY): Payer: Self-pay | Admitting: Psychiatry

## 2013-01-05 ENCOUNTER — Other Ambulatory Visit (HOSPITAL_COMMUNITY): Payer: Medicare Other

## 2013-01-05 DIAGNOSIS — F329 Major depressive disorder, single episode, unspecified: Secondary | ICD-10-CM

## 2013-01-05 DIAGNOSIS — F32A Depression, unspecified: Secondary | ICD-10-CM

## 2013-01-05 NOTE — Progress Notes (Signed)
Patient ID: Cassandra Burns, female   DOB: October 13, 1965, 47 y.o.   MRN: [id number] Patient reviewed and interviewed today, states she feels calmer on the medication sleep is fair but reports no nightmares or flashbacks. Patient is concerned that her routine here at the IOP will be disrupted once she is discharged from here. Processed this at length and gave her numerous locations where she can volunteer and patient stated understanding. Denies suicidal or homicidal ideation no hallucinations or delusions. Patient is tolerating medications well.

## 2013-01-06 ENCOUNTER — Encounter (HOSPITAL_COMMUNITY): Payer: Self-pay

## 2013-01-06 ENCOUNTER — Other Ambulatory Visit (HOSPITAL_COMMUNITY): Payer: Medicare Other

## 2013-01-06 NOTE — Progress Notes (Unsigned)
    Daily Group Progress Note  Program: IOP  Group Time: 9:00  Participation Level:  Behavioral Response:   Type of Therapy:   Summary of Progress: Excused absence     Group Time: 10:45  Participation Level:    Behavioral Response:  Type of Therapy:  Summary of Progress: Excused absence  [facility]

## 2013-01-07 ENCOUNTER — Other Ambulatory Visit (HOSPITAL_COMMUNITY): Payer: Medicare Other

## 2013-01-08 ENCOUNTER — Other Ambulatory Visit (HOSPITAL_COMMUNITY): Payer: Medicare Other

## 2013-01-08 ENCOUNTER — Encounter (HOSPITAL_COMMUNITY): Payer: Self-pay

## 2013-01-08 DIAGNOSIS — F329 Major depressive disorder, single episode, unspecified: Secondary | ICD-10-CM

## 2013-01-08 DIAGNOSIS — F32A Depression, unspecified: Secondary | ICD-10-CM

## 2013-01-08 NOTE — Progress Notes (Unsigned)
    Daily Group Progress Note  Program: IOP  Group Time: 9:00  Participation Level: Active  Behavioral Response: Appropriate  Type of Therapy:  Group Therapy  Summary of Progress: Pt. Participated in group process and heartmath intervention. Discussion focused on dangers of perfectionism.     Group Time: 10:45  Participation Level:  Active  Behavioral Response: Appropriate  Type of Therapy: Psycho-education Group  Summary of Progress: Pt. Participated in education about vulnerability, shame, and developing shame resilience.  Jennifer Brown, Ph.D., NCC, LPC 

## 2013-01-11 ENCOUNTER — Other Ambulatory Visit (HOSPITAL_COMMUNITY): Payer: Medicare Other | Admitting: Psychiatry

## 2013-01-11 ENCOUNTER — Other Ambulatory Visit (HOSPITAL_COMMUNITY): Payer: Medicare Other

## 2013-01-12 ENCOUNTER — Other Ambulatory Visit (HOSPITAL_COMMUNITY): Payer: Medicare Other | Attending: Psychiatry

## 2013-01-12 ENCOUNTER — Other Ambulatory Visit (HOSPITAL_COMMUNITY): Payer: Medicare Other

## 2013-01-12 NOTE — Progress Notes (Signed)
    Daily Group Progress Note  Program: IOP  Group Time: 9:00-10:30 am   Participation Level: Active  Behavioral Response: Appropriate  Type of Therapy:  Process Group  Summary of Progress: pt reports improved mood and self-esteem. She states that she is learning how to better manage the stressors that brought her to the group and how to feel better about herself.      Group Time: 10:30 am - 12:00 pm   Participation Level:  Active  Behavioral Response: Appropriate  Type of Therapy: Psycho-education Group  Summary of Progress: pt participated in a group on grief and loss and learned how to manage grief more effectively.   Carman Ching, LCSW

## 2013-01-13 ENCOUNTER — Other Ambulatory Visit (HOSPITAL_COMMUNITY): Payer: Medicare Other

## 2013-01-13 ENCOUNTER — Other Ambulatory Visit (HOSPITAL_COMMUNITY): Payer: Medicare Other | Admitting: Psychiatry

## 2013-01-13 DIAGNOSIS — F32A Depression, unspecified: Secondary | ICD-10-CM

## 2013-01-13 DIAGNOSIS — F329 Major depressive disorder, single episode, unspecified: Secondary | ICD-10-CM

## 2013-01-13 NOTE — Progress Notes (Signed)
    Daily Group Progress Note  Program: IOP  Group Time: 9:00-10:30 am   Participation Level: Active  Behavioral Response: Appropriate  Type of Therapy:  Process Group  Summary of Progress: pt shared that she missed group yesterday because she was asked to watch her niece and was unable to set healthy boundaries with her son and tell him she needed to attend group. Pt identified themes of how she puts the needs of others before her own and would like to learn how to set better limits with others to ensure better wellness for herself.      Group Time: 10:30 am - 12:00 pm   Participation Level:  Active  Behavioral Response: Appropriate  Type of Therapy: Psycho-education Group  Summary of Progress: Pt participated in Part II of the boundaries eduction and learned barriers to setting healthy boundaries with others based on personality styles.   Carman Ching, LCSW

## 2013-01-14 ENCOUNTER — Other Ambulatory Visit (HOSPITAL_COMMUNITY): Payer: Medicare Other

## 2013-01-14 ENCOUNTER — Other Ambulatory Visit (HOSPITAL_COMMUNITY): Payer: Medicare Other | Admitting: Psychiatry

## 2013-01-14 DIAGNOSIS — F329 Major depressive disorder, single episode, unspecified: Secondary | ICD-10-CM

## 2013-01-14 DIAGNOSIS — F32A Depression, unspecified: Secondary | ICD-10-CM

## 2013-01-14 MED ORDER — FLUOXETINE HCL 20 MG PO CAPS: 20.0000 mg | ORAL_CAPSULE | Freq: Every day | ORAL | Status: DC

## 2013-01-14 NOTE — Progress Notes (Signed)
    Daily Group Progress Note  Program: IOP  Group Time: 9:00-10:30 am   Participation Level: Active  Behavioral Response: Appropriate  Type of Therapy:  Process Group  Summary of Progress: Patient talked about how she is trying to change the way she talks and thinks about herself to improve her self-esteem. Pt also is exploring if her current relationship is healthy for her and shared the pro's and con's to being with the man she dating. Pt is exploring triggers to her depression.      Group Time: 10:30 am - 12:00 pm   Participation Level:  Active  Behavioral Response: Appropriate  Type of Therapy: Psycho-education Group  Summary of Progress:  Patient learned about mental health support groups that can be accessed for continued support in addition to the IOP program and made an action plan on which ones to attend.   Carman Ching, LCSW
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
## 2013-01-15 ENCOUNTER — Other Ambulatory Visit (HOSPITAL_COMMUNITY): Payer: Medicare Other | Admitting: Psychiatry

## 2013-01-15 DIAGNOSIS — F329 Major depressive disorder, single episode, unspecified: Secondary | ICD-10-CM

## 2013-01-15 DIAGNOSIS — F32A Depression, unspecified: Secondary | ICD-10-CM

## 2013-01-15 NOTE — Progress Notes (Signed)
Patient ID: Cassandra Burns, female   DOB: 10/25/1965, 47 y.o.   MRN: [id number] D:  This is a 47 y.o. single african [place] female, referred per assessment department, treatment for ongoing problems with depression and anxiety that have persisted for about 6 years.  Pt attributes the start of her mood problems to the death 12-13-2003) of her mother around that same time. She reports that her mother was in a vegetative state for two years prior to her death, but states that it began in 12-12-01. Pt has significant guilt about these events, because at the time the pt was addicted to crack cocaine and was emotionally unavailable for her mother. She went through outpatient treatment in [place] at the time, but has not had a provider since relocating to [place] in 2008-12-12. Pt reports that she has recurrent mood instability problems around the anniversary of her mother's death, her birthday, Mother's Day, and other relevant annual events. Stressors: 1) both of her adult children and her niece are soon to become parents. "I am their constant. They depend on me." 2) Strained relationship with boyfriend of 1 1/2 years. Pt states she has trust issues with him due to a previous unhealthy relationship she was in. 3) Unresolved grief/loss issues. Pt completed MH-IOP today.  Reports feeling overall better.  "I am able to drive at night and able to set boundaries with my boyfriend and family."  Reports decreased isolation.  Continues to struggle with poor sleep.  Denies any SI/HI or A/V hallucinations.  Pt plans to attend [facility].  A:  D/C today.  Follow up with Maxcine Ham, LCSW on 01-19-13 and Dr. Lolly Mustache on 02-22-13 @ 11 am.  Encouraged support groups.  R:  Pt receptive.

## 2013-01-15 NOTE — Patient Instructions (Addendum)
Patient completed MH-IOP today.  Will follow up with Maxcine Ham, LCSW on 01-19-13 @ 3:30pm and Dr. Lolly Mustache on 02-22-13 @ 11 a.m.  Encouraged support groups.

## 2013-01-15 NOTE — Progress Notes (Signed)
    Daily Group Progress Note  Program: IOP  Group Time: 9:00-10:30 am   Participation Level: Active  Behavioral Response: Appropriate  Type of Therapy:  Process Group  Summary of Progress: Today was Pts last day in the IOP program and she report improved mood and self esteem. Pt is scheduled to begin individual therapy with writer as her follow up plan. Pt identified the following three goals for her counseling treatment. 1) Improve self-esteem 2) continue learning how to set healthy boundaries with family members, 3) learn ways to get out of the house to reduce depression symptoms.      Group Time: 10:30 am - 12:00 pm   Participation Level:  Active  Behavioral Response: Appropriate  Type of Therapy: Psycho-education Group  Summary of Progress: Pt participated in a goodbye ceremony to another member leaving and participated in continued discussion and education about how to set healthy boundaries with others to ensure wellness.   Carman Ching, LCSW

## 2013-01-18 ENCOUNTER — Other Ambulatory Visit (HOSPITAL_COMMUNITY): Payer: Medicare Other

## 2013-01-19 ENCOUNTER — Ambulatory Visit (INDEPENDENT_AMBULATORY_CARE_PROVIDER_SITE_OTHER): Payer: Medicare Other | Admitting: Psychiatry

## 2013-01-19 ENCOUNTER — Other Ambulatory Visit: Payer: Self-pay | Admitting: Pulmonary Disease

## 2013-01-19 ENCOUNTER — Telehealth: Payer: Self-pay | Admitting: Pulmonary Disease

## 2013-01-19 DIAGNOSIS — F411 Generalized anxiety disorder: Secondary | ICD-10-CM | POA: Insufficient documentation

## 2013-01-19 DIAGNOSIS — F331 Major depressive disorder, recurrent, moderate: Secondary | ICD-10-CM

## 2013-01-19 DIAGNOSIS — G4733 Obstructive sleep apnea (adult) (pediatric): Secondary | ICD-10-CM

## 2013-01-19 NOTE — Progress Notes (Signed)
THERAPIST PROGRESS NOTE  Session Time: 3:30-4:30  Participation Level: Active  Behavioral Response: CasualAlertDepressed  Type of Therapy: Individual Therapy  Treatment Goals addressed: Diagnosis: Depression, Major, Recurrent, Moderate and Generalized Anxiety Disorder  Interventions: Other: Created Treatment Plan  Summary: Cassandra Burns is a 47 y.o. female who presents with depressed mood and tearful affect. This is Pts first session with Clinical research associate following successful completion of the MH-IOP program. Pt has been referred for ongoing treatment of depression and anxiety and to manage life stressors that include: 1) Low self-esteem, 2) Relationship stress, Pt states she is in a relationship with a younger man in his thirties who has cheated on her and Pt is uncertain if the relationship is healthy for her. Pt states she reguarly feels unappreciated disrespected by him. 3) Stress with children, Pt states her children take advantage of her and she would like to learn how to set healthier limits with them to have her needs met more of the time.   This session was used to identify goals for treatment and create Pts initial Treatment Plan - see below.  Signed copy was also sent today to be scanned into EPIC     INDIVIDUAL TREATMENT PLAN       Date of Admission:  01/19/13 Date of Treatment Plan: 01/19/13     Service: [x]  Group  [x]  Individual [x]  Comprehensive []  Revised due to: []  Change in Diagnosis     []  Change in Service     []  Expiration of Previous Treatment Plan Diagnosis(es): Depression Major, Recurrent, Moderate and Anxiety Disorder NOS  Recommended Treatment:  [x]  Chemical Dependency (CD-IOP) group therapy 3x weekly and 1:1 individual therapy as required  []  Mental Health (MH-IOP) group therapy 5x weekly and 1:1 individual therapy as required  [x]  Individual Therapy  []  Family Therapy  [x]  Couples Therapy  [] Other:           Possible Negative Outcomes of  Treatment: Symptoms of mental illness may increase and changes in relationship can occur during the course of treatment.  Client's Strengths (What are the strengths and resources that will help clients move towards their goals):  Cooking, listening, Family (daughter), Therapy, [facility] and friendship Randa Evens)   Personal Recovery Goal(s)/Client's Goals for Treatment (use client's words):  Get better with myself, get rid of some of my insecurity issues.    Objective Behavioral Criteria for Discharge: Client will maintain stability in the community as demonstrated by the following:   No suicidal behaviors for 3 months.   No hospitalizations or emergency room visits for psychological reasons for 3 months.   No SIB behaviors requiring medical attention for 3 months.   Emotional regulation by reporting distress at an average of 5/10 or below for 3 months.   Demonstration of healthy community supports by initiating peer contact at least twice weekly separate from the treatment environment.   Agreed-upon transition plan to a less restrictive setting.  INFORMED CONSENT TO TREATMENT. I acknowledge that I have been informed of and am able to understand my diagnosis, the nature and purpose of the proposed treatment, the risks and benefits of the proposed treatment, the possible negative outcomes of and possible alternatives to the proposed treatment, the probability that the proposed treatment will be successful, and the prognosis if I choose not to receive the treatment. Further, I have been informed of the extent and limits of confidentiality of treatment information. I understand the risks, benefits, possible negative outcomes of and alternatives to this  proposed treatment, and my signature below verifies that I have actively participated in the development of my treatment plan and that I am willingly and voluntarily agreeing to the treatment outlined in this plan. Assessed Needs (Problem)  Client's Goals (what client will do)   Interventions (what staff will do)   1. Depression  o Have improved mood and return to a healthier level of functioning.   o Identify the causes for depressed mood and learn ways to cope with depression.  o Use a crisis plan if suicidal thoughts occur.    o Explore how depression is experienced in day-to-day living; encourage sharing of feelings of depression to gain insight into causes and create a coping plan to manage depression symptoms. o Provide education about depression to help patient identify causes, symptoms and triggers. o Teach and encourage the use of coping skills for management of depression symptoms.  o Make a crisis plan and assess ongoing level of safety.    2. Anxiety    o Improve ability to manage anxiety symptoms and better handle stress.  o Identify causes for anxiety and explore ways to lower it  o Resolve the core conflict that is the cause of the anxiety.  o Manage thoughts and worrisome thinking that contribute to feelings of anxiety.    o Complete anxiety assessment (Burns Anxiety Inventory) to explore severity of symptoms. o Provide education about anxiety to help patient identify causes, symptoms and triggers. o Facilitate problem solution skills to help patient identify and implement options to resolve stressors.  o Teach coping skills to manage anxiety symptoms such as; biofeedback, guided imagery, meditation and aromatherapy. o Use CBT to identify and change anxiety provoking thought patterns.   3. Self Esteem  Be happier and more confident in myself  Be able to set limits and boundaries with my children  Learn if my romantic relationship is healthy for me.      Teach self-care skills, positive affirmations and CBT skills to challenge negative self-talk and perceptions.   Educate on healthy boundary setting and encourage and support healthy limits set with family.  Explore how romantic relationship  effects self-confidence.   I have [] received [] declined a copy of this treatment plan.  Client: Date: Guardian/Parent: Date:   Therapist: Date: Licensed Provider (if applicable): Date:    Six month review:   I have reviewed this treatment plan and consider it still valid. Client: Date: Guardian/Parent: Date:      Suicidal/Homicidal: Nowithout intent/plan  Therapist Response: Identified Treatment Goals and created Treatment Plan.   Plan: Return again in 1 week. Pt was oriented to the therapy process, educated on the no show policy, and informed of a need to schedule appointments in advance to ensure consistency of visits.   Diagnosis: Axis I: Depression, Major, Recurrent, Moderate and Generalized Axneity Disorder    Axis II: No diagnosis    Carman Ching, LCSW 01/19/2013

## 2013-01-19 NOTE — Telephone Encounter (Signed)
Returning call can be reached at [phone].Cassandra Burns

## 2013-01-19 NOTE — Telephone Encounter (Signed)
Pt states that she spoke with [facility]  Pt still has not received CPAP States that she is fed up and this seems to be a process that is just too much.  Download completed at the beginning of June---pt states that she has called [facility] and they said they need an order.  Melissa with [facility] told pt that they have sent multiple requests to our office for order to be placed.  Pt is very upset that this is taking so long and the fact that [facility] keeps telling her that they have contacted our office and we will not respond.   D/L received and placed in your Mariachristina Holle folder for review.   Please advise Dr Shelle Iron. thanks

## 2013-01-19 NOTE — Telephone Encounter (Signed)
ATC x1 no vm//NA

## 2013-01-19 NOTE — Telephone Encounter (Signed)
Please make sure he knows that I have not received any request for this.   It appears that a pressure of 18/15 is optimal for bilevel.   Will order for him.

## 2013-01-19 NOTE — Telephone Encounter (Signed)
lmtcb x1 

## 2013-01-20 NOTE — Telephone Encounter (Signed)
Spoke with the pt and notified of recs per [facility] She verbalized understanding and states nothing further needed

## 2013-01-28 ENCOUNTER — Ambulatory Visit (HOSPITAL_COMMUNITY): Payer: Self-pay | Admitting: Psychiatry

## 2013-02-04 ENCOUNTER — Ambulatory Visit (INDEPENDENT_AMBULATORY_CARE_PROVIDER_SITE_OTHER): Payer: Medicare Other | Admitting: Psychiatry

## 2013-02-04 DIAGNOSIS — F411 Generalized anxiety disorder: Secondary | ICD-10-CM

## 2013-02-04 DIAGNOSIS — F331 Major depressive disorder, recurrent, moderate: Secondary | ICD-10-CM

## 2013-02-04 NOTE — Progress Notes (Signed)
   THERAPIST PROGRESS NOTE  Session Time: 1:30-2:30 pm  Participation Level: Active  Behavioral Response: CasualAlertEuthymic  Type of Therapy: Individual Therapy  Treatment Goals addressed: Diagnosis: Goals: Number 1  Interventions: CBT and Solution Focused  Summary: Cassandra Burns is a 47 y.o. female who presents with euthymic mood and affect. Pt described improved mood, reduced depression and presented with elevated affect. Pt said she has "peace" with her relationship to her boyfriend after confronting him with concerns she had with the relationship. Pt said they talked and got on the same page with several things that had been bothering her and she feels better about the relationship going forward. Pt said she is still wanting to explore if the relationship is good for her longterm, but the assertive communication has reduced her depression. Pt said she is also concerned that she has had the desire to start drinking and wants to discuss this in the next session because she fears it is not healthy for her due to it causing medical complications in the past. Pt said she has increased her physical activities some and is taking the stairs to and from appointments for the first time.    Suicidal/Homicidal: Nowithout intent/plan  Therapist Response: Assessed level of depression and anxiety, worked on goal number 1 of ways to reduce depression by exploring her relationship to her boyfriend and discussion effective communication and ways to build trust. Educated Pt on ways to improve physical health to reduce and manage depression.   Plan: Return again in 1 weeks.  Diagnosis: Axis I: Anxiety Disorder NOS and Depression, Major, Recurrent Moderate    Axis II: No diagnosis    Hazel Wrinkle E, LCSW 02/04/2013

## 2013-02-12 ENCOUNTER — Ambulatory Visit (INDEPENDENT_AMBULATORY_CARE_PROVIDER_SITE_OTHER): Payer: Medicare Other | Admitting: Psychiatry

## 2013-02-12 DIAGNOSIS — F411 Generalized anxiety disorder: Secondary | ICD-10-CM

## 2013-02-12 DIAGNOSIS — F331 Major depressive disorder, recurrent, moderate: Secondary | ICD-10-CM

## 2013-02-12 NOTE — Progress Notes (Signed)
   THERAPIST PROGRESS NOTE  Session Time: 1:30-2:30 pm  Participation Level: Active  Behavioral Response: CasualAlertDepressed  Type of Therapy: Individual Therapy  Treatment Goals addressed: Diagnosis: Goal 3 - Self Esteem  Interventions: CBT, Solution Focused and Supportive  Summary: Cassandra Burns is a 47 y.o. female who presents with mild depressed mood. Pt reports feeling "good" overall and was smiling at the start of the session, but as Pt started talking about stressors her affect changed to sadness. Pt talked about how her boyfriend is asking her to pay half the rent and she is torn because she is paying all the bills there and now is paying the bills for her daughter who is pregnant and just lost her job. Pt described valuing helping family and gave examples of how her family sticks together to support each other and ensure wellness. Pts boyfriend does not believe Pt should be paying bills for her children and this is causing conflict in their relationship. Pt used the session to explore her feelings surrounding this issue and to determine what she will do if her boyfriend decides Pt must start paying half the rent. Pt is learning how to stand up for what she values instead of meeting the needs of others before her own.  Pt is practicing setting limits with her boyfriend as she becomes more confident in herself.   Suicidal/Homicidal: Nowithout intent/plan  Therapist Response: Worked on goal three of self-esteem by pointing out Pts strengths and resiliency and helping her chose what her values are and discussing how to support those values despite the needs of others.    Plan: Return again in 1 weeks.  Diagnosis: Axis I: Anxiety Disorder NOS and Depression Major Recurrent Moderate    Axis II: No diagnosis    Firman Petrow E, LCSW 02/12/2013

## 2013-02-18 ENCOUNTER — Emergency Department (INDEPENDENT_AMBULATORY_CARE_PROVIDER_SITE_OTHER): Payer: Medicare Other

## 2013-02-18 ENCOUNTER — Encounter (HOSPITAL_COMMUNITY): Payer: Self-pay | Admitting: Emergency Medicine

## 2013-02-18 ENCOUNTER — Emergency Department (INDEPENDENT_AMBULATORY_CARE_PROVIDER_SITE_OTHER)
Admission: EM | Admit: 2013-02-18 | Discharge: 2013-02-18 | Disposition: A | Discharge status: Home / Self Care | Payer: Medicare Other | Source: Home / Self Care

## 2013-02-18 DIAGNOSIS — G5711 Meralgia paresthetica, right lower limb: Secondary | ICD-10-CM

## 2013-02-18 DIAGNOSIS — G5710 Meralgia paresthetica, unspecified lower limb: Secondary | ICD-10-CM

## 2013-02-18 MED ORDER — TRAMADOL HCL 50 MG PO TABS: 50.0000 mg | ORAL_TABLET | Freq: Four times a day (QID) | ORAL | Status: DC | PRN

## 2013-02-18 MED ORDER — NAPROXEN 375 MG PO TABS: 375.0000 mg | ORAL_TABLET | Freq: Two times a day (BID) | ORAL | Status: DC

## 2013-02-18 NOTE — ED Provider Notes (Signed)
History    CSN: [id number] Arrival date & time 02/18/13  7846  First MD Initiated Contact with Patient 02/18/13 1027     Chief Complaint  Patient presents with  . Leg Pain   (Consider location/radiation/quality/duration/timing/severity/associated sxs/prior Treatment) HPI Comments: 47 year old morbidly obese female is complaining of sharp pain in the right thigh. She states it feels more of a sharp, nervelike pain it does a muscle type pain. The pain encompasses the anterior and lateral portion of the thigh from the right hip to the knee. It occurs with standing and ambulation. He is better with lying down. After she arises from a supine position and starts walking within 10 minutes she develops pain in the hip and femur. The pain becomes constant. Denies pain in the back or history of injury to the back. She states that she walks with a limp. She denies any known traumatic event. No known injury. The symptoms began approximately 4 days ago. Coincidentally, I noticed her Jimmey Ralph her vehicle outside get out of the vehicle and walk in to the urgent care with a rather smooth and balanced gait, no limp.  Past Medical History  Diagnosis Date  . Hypertension   . Asthma   . Iron deficiency anemia due to chronic blood loss   . Menometrorrhagia   . Psoriasis   . Sleep apnea   . 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 Obesity 12/18/2012  . Depression   . Anxiety    Past Surgical History  Procedure Laterality Date  . Tubal ligation     Family History  Problem Relation Age of Onset  . Diabetes type II Father   . Asthma Father   . Hypertension Father   . Heart murmur Father   . Alcohol abuse Father   . Drug abuse Father   . Hypertension Mother   . Alcohol abuse Mother   . Drug abuse Mother    History  Substance Use Topics  . Smoking status: Never Smoker   . Smokeless tobacco: Never Used     Comment: Smokes Marijuana  . Alcohol Use: No   OB History   Grav Para Term Preterm  Abortions TAB SAB Ect Mult Living                 Review of Systems  Constitutional: Positive for activity change. Negative for fever and chills.  HENT: Negative.   Respiratory: Negative.   Cardiovascular: Negative.   Musculoskeletal:       As per HPI  Skin: Negative for color change, pallor and rash.  Neurological: Negative.     Allergies  Review of patient's allergies indicates no known allergies.  Home Medications   Current Outpatient Rx  Name  Route  Sig  Dispense  Refill  . albuterol (PROVENTIL) (2.5 MG/3ML) 0.083% nebulizer solution   Nebulization   Take 2.5 mg by nebulization every 6 (six) hours as needed. For shortness of breath         . amLODipine (NORVASC) 5 MG tablet   Oral   Take 5 mg by mouth 2 (two) times daily.           [name] FLUoxetine (PROZAC) 20 MG capsule   Oral   Take 1 capsule (20 mg total) by mouth daily.   30 capsule   0   . hydrOXYzine (VISTARIL) 50 MG capsule   Oral   Take 1 capsule (50 mg total) by mouth 3 (three) times daily as needed for itching.  30 capsule   0   . loratadine (CLARITIN) 10 MG tablet   Oral   Take 10 mg by mouth daily.           [name] losartan (COZAAR) 100 MG tablet   Oral   Take 100 mg by mouth daily.           . Menthol, Topical Analgesic, (BIOFREEZE EX)   Apply externally   Apply 1 application topically as needed. For stomach for pain.         . metFORMIN (GLUCOPHAGE) 1000 MG tablet   Oral   Take 1,000 mg by mouth 2 (two) times daily with a meal.         . naproxen (NAPROSYN) 375 MG tablet   Oral   Take 1 tablet (375 mg total) by mouth 2 (two) times daily.   20 tablet   0   . traMADol (ULTRAM) 50 MG tablet   Oral   Take 1 tablet (50 mg total) by mouth every 6 (six) hours as needed for pain.   15 tablet   0    BP 146/74  Pulse 87  Temp(Src) 98.3 F (36.8 C) (Oral)  Resp 20  SpO2 98% Physical Exam  Nursing note and vitals reviewed. Constitutional: She is oriented to person, place, and  time. She appears well-developed and well-nourished. No distress.  HENT:  Head: Normocephalic and atraumatic.  Neck: Normal range of motion. Neck supple.  Cardiovascular: Normal rate.   Pulmonary/Chest: Effort normal. No respiratory distress.  Musculoskeletal:  Extension of the knees to 180. Strength is 5 over 5 in both lower extremities. Contraction of the quadricep muscles does not produce pain. There is no tenderness to the quadriceps or lateral musculature of the right thigh. No tenderness to the knee. Mild tenderness to percussion  of the right hip. No swelling, discoloration or deformity. Knee with full range of motion and no tenderness or swelling. Distal neurovascular motor sensory is intact.  Neurological: She is alert and oriented to person, place, and time. No cranial nerve deficit. She exhibits normal muscle tone.  Skin: Skin is warm and dry.  Psychiatric: She has a normal mood and affect.    ED Course  Procedures (including critical care time) Labs Reviewed - No data to display Dg Hip Complete Right  02/18/2013   *RADIOLOGY REPORT*  Clinical Data: Right hip and leg pain  RIGHT HIP - COMPLETE 2+ VIEW  Comparison: None.  Findings: There is mild degenerative joint disease of the hips with some loss of joint space and mild superior acetabular spurring. However, no acute fracture is seen.  The pelvic rami are intact, and the SI joints appear corticated.  Calcification in the right pelvis is consistent with a small calcified uterine fibroid.  IMPRESSION:  1.  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  No acute abnormality. 2. Small calcified uterine fibroid.   Original Report Authenticated By: Dwyane Dee, M.D.   Dg Femur Right  02/18/2013   *RADIOLOGY REPORT*  Clinical Data: Right leg pain  RIGHT FEMUR - 2 VIEW  Comparison: None.  Findings: Mild degenerative changes of the right hip joint are noted.  The femur is within normal limits.  No acute fracture or dislocation is noted.  No  soft tissue changes are noted in the thigh.  IMPRESSION: No acute abnormalities seen.   Original Report Authenticated By: Alcide Clever, M.D.   1. Meralgia paresthetica of right side   2. Morbid obesity  MDM  Naprosyn and tramadol for pain Info for meralgia paresthetica Reassurance  Hayden Rasmussen, NP 02/18/13 1218

## 2013-02-18 NOTE — ED Provider Notes (Signed)
Medical screening examination/treatment/procedure(s) were performed by a resident physician or non-physician practitioner and as the supervising physician I was immediately available for consultation/collaboration.  Clementeen Graham, MD   Rodolph Bong, MD 02/18/13 1754

## 2013-02-18 NOTE — ED Notes (Signed)
Right leg pain, right hip pain that started 7/20.  No known injury.  Patient reports this leg feels weak and like it will "collapse" or give away.

## 2013-02-22 ENCOUNTER — Ambulatory Visit (HOSPITAL_COMMUNITY): Payer: Self-pay | Admitting: Psychiatry

## 2013-03-04 ENCOUNTER — Ambulatory Visit (HOSPITAL_COMMUNITY): Payer: Self-pay | Admitting: Licensed Clinical Social Worker

## 2013-03-05 ENCOUNTER — Telehealth: Payer: Self-pay | Admitting: *Deleted

## 2013-03-05 NOTE — Telephone Encounter (Signed)
ONO results have been received and placed in [facility] green folder for review. Please advise, thanks.   

## 2013-03-08 ENCOUNTER — Other Ambulatory Visit: Payer: Medicare Other

## 2013-03-08 ENCOUNTER — Telehealth: Payer: Self-pay | Admitting: Pulmonary Disease

## 2013-03-09 ENCOUNTER — Telehealth: Payer: Self-pay | Admitting: *Deleted

## 2013-03-09 NOTE — Telephone Encounter (Signed)
Records have been received and placed in your [facility] folder to review.  ONO results and D/L results (per pt request)

## 2013-03-09 NOTE — Telephone Encounter (Signed)
Please let pt know that her oxygen level fell as low as 83% during the night on BIPAP, but did not spend that long a period of time less than 88%.  I would not recommend starting oxygen at this time, but would keep in mind.

## 2013-03-09 NOTE — Telephone Encounter (Signed)
POF sent to r/s missed lab from today.

## 2013-03-09 NOTE — Telephone Encounter (Signed)
I spoke with pt. She stated she is calling for her CPAP download results and ONO. It looks like per note 03/05/13 ONO is in [facility] look at. Will forward to ashtyn to see about results regarding her download. Please advise thanks

## 2013-03-09 NOTE — Telephone Encounter (Signed)
D/L requested from [facility] to be faxed ASAP. Pressure was changed back in June to 18/15. D/L triage fax.  ONO results are in [facility] review.

## 2013-03-10 NOTE — Telephone Encounter (Signed)
Results have been explained to patient, pt expressed understanding. Nothing further needed.  

## 2013-03-12 ENCOUNTER — Ambulatory Visit (HOSPITAL_BASED_OUTPATIENT_CLINIC_OR_DEPARTMENT_OTHER): Payer: Medicare Other | Admitting: Psychiatry

## 2013-03-12 DIAGNOSIS — F331 Major depressive disorder, recurrent, moderate: Secondary | ICD-10-CM

## 2013-03-12 DIAGNOSIS — F411 Generalized anxiety disorder: Secondary | ICD-10-CM

## 2013-03-12 NOTE — Progress Notes (Signed)
   THERAPIST PROGRESS NOTE  Session Time: 2:30-3:30 pm  Participation Level: Active  Behavioral Response: CasualAlertDepressed  Type of Therapy: Individual Therapy  Treatment Goals addressed: Coping  Interventions: Motivational Interviewing and Solution Focused  Summary: Cassandra Burns is a 47 y.o. female who presents with moderate depressed mood and tearful affect. Pt reports her main stressor involving learning that her boyfriend was lying and cheating on her as she has suspected since they first started dating. Pt said she moved out but continues to have relations with him and wants the relationship to work. Pt processed themes of confusion about the future of the relationship, but also knows this is a big trigger for her depression.  Pt explored themes of trying to change him to make him the boyfriend he could be and struggled with this idea of only having control over herself and with acceptance of the situation as it is.   Suicidal/Homicidal: Nowithout intent/plan  Therapist Response: Assessed overall level of depression. Used motivational interviewing to help Pt gain insight into how her current situation is causing depression, assess motivation for change and create ambivalence for change.   Plan: Return again in 1 weeks.  Diagnosis: Axis I: Depression Major Recurrent Moderate and Generalized Anxiety Disorder    Axis II: No diagnosis    Carman Ching, LCSW 03/12/2013

## 2013-03-15 ENCOUNTER — Ambulatory Visit (HOSPITAL_COMMUNITY): Payer: Self-pay | Admitting: Psychiatry

## 2013-03-22 ENCOUNTER — Encounter: Payer: Self-pay | Admitting: Pulmonary Disease

## 2013-03-25 ENCOUNTER — Encounter (HOSPITAL_COMMUNITY): Payer: Self-pay | Admitting: Psychiatry

## 2013-03-25 ENCOUNTER — Ambulatory Visit (INDEPENDENT_AMBULATORY_CARE_PROVIDER_SITE_OTHER): Payer: Medicare Other | Admitting: Psychiatry

## 2013-03-25 VITALS — BP 128/82 | HR 84 | Ht 59.0 in | Wt 235.6 lb

## 2013-03-25 DIAGNOSIS — F339 Major depressive disorder, recurrent, unspecified: Secondary | ICD-10-CM

## 2013-03-25 DIAGNOSIS — F332 Major depressive disorder, recurrent severe without psychotic features: Secondary | ICD-10-CM

## 2013-03-25 MED ORDER — HYDROXYZINE PAMOATE 50 MG PO CAPS: 50.0000 mg | ORAL_CAPSULE | Freq: Two times a day (BID) | ORAL | Status: DC

## 2013-03-25 MED ORDER — FLUOXETINE HCL 10 MG PO CAPS: ORAL_CAPSULE | ORAL | Status: DC

## 2013-03-25 NOTE — Progress Notes (Signed)
[facility] Psychiatric Assessment Note  Cassandra Burns [id number] 47 y.o.  03/25/2013 2:54 PM  Chief Complaint:  Establish care .  I want to continue my medication.    History of Present Illness:  Patient is a 47 year old single African American female who is referred from intensive outpatient program.  Patient finished intensive outpatient program in June however she had missed the appointment because she was sick.  She was self-referred to intensive outpatient program.  She was experiencing increased depression and anxiety symptoms.  Her mother died in 12-17-2003 after having in a vegetative state for 2 years prior to her death.  Patient having anxiety and depressive symptoms around that time.  She admitted using crack cocaine to self medicate.  However she was given Lexapro in [place] but when she moved in Dec 16, 2008 and she was out of her psychiatric medication.  She started experiencing increased depression and anxiety.  She decided to get treatment at [facility] but could not get help because of insurance.  She started intensive outpatient program and her medications were started.  She was given Prozac 20 mg and hydroxyzine 50 mg 2 times a day.  She is also seen Carollee Herter in this office for counseling.  She has shown some improvement in her anxiety and depression.  Her recent stressors are financial issues, got 3 family members who are pregnant and they are due within few months.  Patient has limited support system.  She endorsed crying spells, some mood swings, irritability, feeling overwhelmed and having panic attacks.  She endorsed chronic feeling of hopelessness and helplessness and passive suicidal thoughts but denies any active suicidal thoughts or plan.  She wants to help her children.  Her daughter is pregnant, her son's boyfriend is pregnant and her niece is pregnant.  Patient also endorsed some time having auditory hallucination of her deceased mother, however she feels they are  comforting and supporting her.  Patient denies any paranoia but admitted irritability anger and mood swings.  She feels very uncomfortable when she is traveling to the car.  She admitted some panic attack but denies any aggression or violence.  She feels Prozac is working to some extent.  She sleeps 6-7 hours.  She uses BiPAP machine.  She endorsed decreased energy, lack of motivation, decreased attention and chronic fatigue.  She denies any side effects of medication.  She has no tremors or shakes.  Past psychiatric history. Patient denies any history of psychiatric inpatient treatment.  She start seeing psychiatrist in 2005/12/16 in [place], [place].  At that time she was using crack and cocaine and decided to stop and get some help.  She denies any suicidal attempt admitted passive suicidal thinking on and off.  She admitted history of scratches on her torso when she was using crack and cocaine , presumably self-inflicted but she never tried to kill herself.  She denies any paranoia but admitted some time very nervous and anxious around people.  She took Lexapro but some good response.  However she gained weight.  She finished intensive outpatient program in June .  She is seeing a therapist in his office for coping and social skills.  She denies any history of mania, aggression, violence  Or psychosis.  Patient denies any history of PTSD, flashbacks or nightmares.  Patient denies any history of electroconvulsive treatment.  History of abuse. Patient denies any history of physical, sexual, verbal or emotional abuse.  Psychosocial history. Patient was born and raised in [place], [place].  She  has a big family.  Her grandmother has 13 children.  She has multiple cousins.  She has 2 children from 2 different relationship.  Patient has no contact with the father of children.  She was very close to her mother who died in 12-17-03.  In Dec 16, 2008 she moved to [place] to live close to her cousin.   Her cousin then sent to jail because selling pain pills.  His cousin moved to [place].  Patient has a lot of family member who lives in [place] however she does not want to go back.  Alcohol and substance use is treated Patient admitted history of addiction with crack and cocaine in the past.  However she claims to be sober since Dec 16, 2005 with crack and cocaine.  She still smokes marijuana to 3 times a week.  She has a drinking.  Military history. Denies.  Education and work history. Patient has worked under Dec 17, 2007 when she had disability.  Family history Patient denies any family member who has any psychiatric illness.  Medical history. Her primary care physician his [facility].  She has obstructive sleep apnea, hypertension, bradycardia Astelin for inability, diabetes mellitus, obesity, iron deficiency anemia and chronic pain.  She uses BiPAP machine.  Patient denies any history of traumatic brain injury, seizures or any concussion.  Suicidal Ideation: No Plan Formed: No Patient has means to carry out plan: No  Homicidal Ideation: No Plan Formed: No Patient has means to carry out plan: No  Review of Systems: Psychiatric: Agitation: No Hallucination: No Depressed Mood: Yes Insomnia: Yes Hypersomnia: No Altered Concentration: No Feels Worthless: No Grandiose Ideas: No Belief In Special Powers: No New/Increased Substance Abuse: No Compulsions: No  Neurologic: Headache: Yes Seizure: No Paresthesias: No   Outpatient Encounter Prescriptions as of 03/25/2013  Medication Sig Dispense Refill  . albuterol (PROVENTIL) (2.5 MG/3ML) 0.083% nebulizer solution Take 2.5 mg by nebulization every 6 (six) hours as needed. For shortness of breath      . amLODipine (NORVASC) 5 MG tablet Take 5 mg by mouth 2 (two) times daily.        [name] FLUoxetine (PROZAC) 10 MG capsule Take 3 capsule a day  90 capsule  0  . hydrOXYzine (VISTARIL) 50 MG capsule Take 1 capsule (50 mg total)  by mouth 2 (two) times daily.  60 capsule  0  . loratadine (CLARITIN) 10 MG tablet Take 10 mg by mouth daily.        [name] losartan (COZAAR) 100 MG tablet Take 100 mg by mouth daily.        . Menthol, Topical Analgesic, (BIOFREEZE EX) Apply 1 application topically as needed. For stomach for pain.      . metFORMIN (GLUCOPHAGE) 1000 MG tablet Take 1,000 mg by mouth 2 (two) times daily with a meal.      . naproxen (NAPROSYN) 375 MG tablet Take 1 tablet (375 mg total) by mouth 2 (two) times daily.  20 tablet  0  . traMADol (ULTRAM) 50 MG tablet Take 1 tablet (50 mg total) by mouth every 6 (six) hours as needed for pain.  15 tablet  0  . [DISCONTINUED] FLUoxetine (PROZAC) 20 MG capsule Take 1 capsule (20 mg total) by mouth daily.  30 capsule  0  . [DISCONTINUED] hydrOXYzine (VISTARIL) 50 MG capsule Take 1 capsule (50 mg total) by mouth 3 (three) times daily as needed for itching.  30 capsule  0   No facility-administered encounter medications on file as of  03/25/2013.    No results found for this or any previous visit (from the past 2160 hour(s)).   Physical Exam: Constitutional:  BP 128/82  Pulse 84  Ht 4\' 11"  (1.499 m)  Wt 235 lb 9.6 oz (106.867 kg)  BMI 47.56 kg/m2  Musculoskeletal: Strength & Muscle Tone: within normal limits Gait & Station: normal Patient leans: N/A  Mental Status Examination;  Patient is a morbidly obese female who is casually dressed and fairly groomed.  She appears anxious and tearful.  She is easily crying about her stressors and living situation.  She described her mood as sad and depressed and her affect is constricted.  She denies any active or passive suicidal thoughts or homicidal thoughts.  She denies any auditory or visual hallucination.  Her speech is slow but clear and coherent.  Her thought processes also slow.  There were no flight of ideas or any loose association.  There were no paranoia or delusions present at this time.  Her attention and concentration is  fair.  There were no tremors or shakes.  Her fund of knowledge is fair.  She is alert and oriented x3.  Her insight judgment and impulse control is okay.   Medical Decision Making (Choose Three): Review of Psycho-Social Stressors (1), Review or order clinical lab tests (1), Review and summation of old records (2), Established Problem, Worsening (2), Review of Medication Regimen & Side Effects (2) and Review of New Medication or Change in Dosage (2)  Assessment: Axis I: Maj. depressive disorder, recurrent  Axis II: Deferred  Axis III:  Past Medical History  Diagnosis Date  . Hypertension   . Asthma   . Iron deficiency anemia due to chronic blood loss   . Menometrorrhagia   . Psoriasis   . Sleep apnea   . 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 Obesity 12/18/2012  . Depression   . Anxiety     Axis IV: Moderate   Plan:  I reviewed her symptoms, psychosocial history, current medication and stressors.  Patient continues to have depressive symptoms.  She is taking Prozac 20 mg and hydroxyzine 50 mg twice a day.  Recommended to take Prozac 30 mg daily to help target symptoms of depression and anxiety.  Recommend to see Carollee Herter for coping and social skills.  Discuss her cannabis use.  Recommend to stop you do interaction with psychotropic medication and her own illness.  Recommend to call us back if she has any questions or any concern.  Time spent 55 minutes.  Followup in 2-3 weeks.Time spent 25 minutes.  More than 50% of the time spent in psychoeducation, counseling and coordination of care.  Discuss safety plan that anytime having active suicidal thoughts or homicidal thoughts then patient need to call 911 or go to the local emergency room.   ARFEEN,SYED T., MD 03/25/2013

## 2013-04-05 ENCOUNTER — Telehealth: Payer: Self-pay | Admitting: Pulmonary Disease

## 2013-04-05 NOTE — Telephone Encounter (Signed)
Spoke with patient-- patient states she is returning a call from our office I did not see anything in patients chart regarding a call to her Patient is scheduled to be seen tomorrow with Dr. Shelle Iron Advised patient that the call was more than likely to confirm appt with patient-- appt confirmed and nothing further needed at this time

## 2013-04-06 ENCOUNTER — Ambulatory Visit (INDEPENDENT_AMBULATORY_CARE_PROVIDER_SITE_OTHER): Payer: Medicare Other | Admitting: Psychiatry

## 2013-04-06 ENCOUNTER — Encounter: Payer: Self-pay | Admitting: Pulmonary Disease

## 2013-04-06 ENCOUNTER — Ambulatory Visit (INDEPENDENT_AMBULATORY_CARE_PROVIDER_SITE_OTHER): Payer: Medicare Other | Admitting: Pulmonary Disease

## 2013-04-06 VITALS — BP 144/82 | HR 102 | Temp 98.3°F | Ht 59.0 in | Wt 234.2 lb

## 2013-04-06 DIAGNOSIS — R0609 Other forms of dyspnea: Secondary | ICD-10-CM

## 2013-04-06 DIAGNOSIS — E662 Morbid (severe) obesity with alveolar hypoventilation: Secondary | ICD-10-CM

## 2013-04-06 DIAGNOSIS — R0600 Dyspnea, unspecified: Secondary | ICD-10-CM

## 2013-04-06 DIAGNOSIS — G4733 Obstructive sleep apnea (adult) (pediatric): Secondary | ICD-10-CM

## 2013-04-06 DIAGNOSIS — R0989 Other specified symptoms and signs involving the circulatory and respiratory systems: Secondary | ICD-10-CM

## 2013-04-06 DIAGNOSIS — F331 Major depressive disorder, recurrent, moderate: Secondary | ICD-10-CM

## 2013-04-06 NOTE — Assessment & Plan Note (Signed)
Patient is doing well with her bilevel device.  I have encouraged her to work aggressively on weight loss.

## 2013-04-06 NOTE — Patient Instructions (Addendum)
I will make sure your bilevel pressure setting is on 18/15. Keep working on weight loss Keep up with mask cushion changes and supplies for bilevel Followup with me in 6mos if doing well.

## 2013-04-06 NOTE — Progress Notes (Signed)
  Subjective:    Patient ID: Cassandra Burns, female    DOB: [birth year], 47 y.o.   MRN: [id number]  HPI The patient comes in today for followup of her chronic dyspnea on exertion that is multifactorial, as well as her obesity hypoventilation/sleep apnea.  She is wearing bilevel compliantly at an adequate pressure, and feels that she is doing well with the device. She is sleeping adequately, and is satisfied with her daytime alertness.  She has not had any increased lower extremity edema.  She is trying to work on weight loss with an exercise program, and finds that she can clearly do more from an exertional standpoint when her weight goes down.  She has had a followup echo that showed resolution of her prior pulmonary hypertension.   Review of Systems  Constitutional: Negative for fever and unexpected weight change.  HENT: Negative for ear pain, nosebleeds, congestion, sore throat, rhinorrhea, sneezing, trouble swallowing, dental problem, postnasal drip and sinus pressure.   Eyes: Negative for redness and itching.  Respiratory: Positive for shortness of breath. Negative for cough, chest tightness and wheezing.   Cardiovascular: Negative for palpitations and leg swelling.  Gastrointestinal: Negative for nausea and vomiting.  Genitourinary: Negative for dysuria.  Musculoskeletal: Negative for joint swelling.  Skin: Negative for rash.  Neurological: Negative for headaches.  Hematological: Does not bruise/bleed easily.  Psychiatric/Behavioral: Negative for dysphoric mood. The patient is not nervous/anxious.        Objective:   Physical Exam Morbidly obese female in no acute distress Nose without purulent discharge noted No skin breakdown or pressure necrosis from the CPAP mask Neck without LN or TMG Chest with clear breath sounds, no wheezing Cardiac exam is regular rate and rhythm Lower extremities without edema, no cyanosis Alert and oriented, moves all 4 extremities.        Assessment & Plan:

## 2013-04-06 NOTE — Assessment & Plan Note (Signed)
The patient feels that her exertional tolerance improves whenever she stays consistent with her exercise program and loses some weight.  I have asked her to continue on this.

## 2013-04-06 NOTE — Progress Notes (Signed)
   THERAPIST PROGRESS NOTE  Session Time: 1:30-2:30 pm  Participation Level: Active  Behavioral Response: CasualAlertDepressed  Type of Therapy: Individual Therapy  Treatment Goals addressed: Coping  Interventions: Motivational Interviewing and Solution Focused  Summary: Cassandra Burns is a 47 y.o. female who presents with moderate depressed mood and tearful affect. Pt said she is "still with her boyfriend" and described a tenuous relationship with arguments and conflict over him still having contact with his ex-girlfriend. Pt said her family has been made aware of the stress in their relationship when the situation became public over [employer]. Pt describes wanting to remain in the relationship and wants to make him want to stop having a relationship with his ex-girlfriend. Pt is struggling with accepting that her boyfriend does not want to change and that he may be benefiting from being in two relationships. Pt wants him to have the same hopes for the relationship that she has. Pt was open to playing the part of her boyfriend and was able to identify why he would not want to give up either relationship, but Pt said she needs to stay in the relationship even if it is hurting her because she needs to nurture someone now that her children are grown up because that is her only identify if she is nurturing another person. Pt has low self-esteem and appears to get a sense of self worth out of caring for others.     Suicidal/Homicidal: Nowithout intent/plan  Therapist Response: Assessed overall level of depression and used motivational interviewing to create ambivalence with her current relationship. Worked on self esteem.   Plan: Return again in 2weeks.  Diagnosis: Axis I: Depression, Major, Recurrent, Moderate    Axis II: No diagnosis    Arbadella Kimbler E, LCSW 04/06/2013

## 2013-04-15 ENCOUNTER — Ambulatory Visit (INDEPENDENT_AMBULATORY_CARE_PROVIDER_SITE_OTHER): Payer: Medicare Other | Admitting: Psychiatry

## 2013-04-15 DIAGNOSIS — F411 Generalized anxiety disorder: Secondary | ICD-10-CM

## 2013-04-15 DIAGNOSIS — F331 Major depressive disorder, recurrent, moderate: Secondary | ICD-10-CM

## 2013-04-15 NOTE — Progress Notes (Signed)
   THERAPIST PROGRESS NOTE  Session Time: 3:00-4:00 pm  Participation Level: Active  Behavioral Response: CasualAlertEuthymic  Type of Therapy: Individual Therapy  Treatment Goals addressed: Coping  Interventions: CBT  Summary: Cassandra Burns is a 47 y.o. female who presents with euthymic mood and affect. She said she thought about her relationship with her boyfriend and decided she wants to keep it but not put a lot of stake in the future of the relationship. Pt said she has accepted that the relationship will not be long term and she is just enjoying it while she has it. This change in thinking and acceptance appears to have helped her mood and she appears to be at a baseline level of functioning. Pt continues to present with low self-esteem and she said she is unhappy in her body and wants to lose weight but is not sure she is motivated to make the changes necessary to start that process.  Pt also processed the stress of her daughter being due with her first child in a few weeks and how Pt will manage this transition.   Suicidal/Homicidal: Nowithout intent/plan  Therapist Response: Assessed overall level of functioning per Pt self-report and explored changes Pt can make to help her feel happier with her weight and her body.   Plan: Return again in 1 weeks.  Diagnosis: Axis I: Depression Major Recurrent Moderate and Generalized Anxiety Disorder    Axis II: No diagnosis    Cassandra Spader E, LCSW 04/15/2013

## 2013-04-16 ENCOUNTER — Encounter (HOSPITAL_COMMUNITY): Payer: Self-pay | Admitting: Psychiatry

## 2013-04-16 ENCOUNTER — Ambulatory Visit (INDEPENDENT_AMBULATORY_CARE_PROVIDER_SITE_OTHER): Payer: Medicare Other | Admitting: Psychiatry

## 2013-04-16 VITALS — BP 140/94 | HR 72 | Resp 24 | Ht 59.0 in | Wt 235.8 lb

## 2013-04-16 DIAGNOSIS — F339 Major depressive disorder, recurrent, unspecified: Secondary | ICD-10-CM

## 2013-04-16 DIAGNOSIS — F332 Major depressive disorder, recurrent severe without psychotic features: Secondary | ICD-10-CM

## 2013-04-16 MED ORDER — HYDROXYZINE PAMOATE 100 MG PO CAPS: 100.0000 mg | ORAL_CAPSULE | Freq: Every day | ORAL | Status: DC

## 2013-04-16 MED ORDER — FLUOXETINE HCL 40 MG PO CAPS: ORAL_CAPSULE | ORAL | Status: DC

## 2013-04-16 NOTE — Progress Notes (Signed)
[facility] [id number] Progress Note  Cassandra Burns [id number] 47 y.o.  04/16/2013 5:13 PM  Chief Complaint:  My best friend died last week .  I'm feeling sad.      History of Present Illness:  Patient is a 47 year old single [place] American female who came for her followup appointment.  She was seen first time on August 28 , she was referred from intensive outpatient program.  Recommended Prozac 30 mg to help her depression and anxiety symptoms.  She is also taking Vistaril 50 mg for insomnia.  Patient felt medicines are working very well until recently she lost her friend and decided to feel again very depressed.  She sleeping 4-5 hours.  She continued to endorse social isolation, anhedonia and crying spells.  She denies any side effects of medication.  She feels sometimes helpless however she denies any active or passive suicidal thoughts or homicidal thoughts.  She is using BiPAP machine .  She has chronic issues related to her finance, 3 family members were pregnant and due within few months.  Patient admitted social isolation but denies any hallucination or paranoia.  She is not drinking or using any illegal substance.  She is seeing therapists in this office for coping and social skills.  Past psychiatric history. Patient denies any history of psychiatric inpatient treatment.  She start seeing psychiatrist in 20-Dec-2005 in [place], [place].  At that time she was using crack and cocaine and decided to stop and get some help.  She denies any suicidal attempt, paranoia however admitted passive suicidal thinking on and off.  She admitted history of scratches on her torso when she was using crack and cocaine , presumably self-inflicted but she never tried to kill herself.  She took Lexapro but some good response.  However she gained weight.  She finished intensive outpatient program in June .  She denies any history of mania, aggression, violence  Or psychosis.  Patient denies  any history of PTSD, flashbacks or nightmares.  Patient denies any history of electroconvulsive treatment.  History of abuse. Patient denies any history of physical, sexual, verbal or emotional abuse.  Psychosocial history. Patient was born and raised in [place], [place].  She has a big family.  Her grandmother has 13 children.  She has multiple cousins.  She has 2 children from 2 different relationship.  Patient has no contact with the father of children.  She was very close to her mother who died in 2003-12-21.  In 20-Dec-2008 she moved to [place] to live close to her cousin.  Her cousin then sent to jail because selling pain pills.  His cousin moved to [place].  Patient has a lot of family member who lives in [place] however she does not want to go back.  Alcohol and substance use is treated Patient admitted history of addiction with crack and cocaine in the past.  However she claims to be sober since 20-Dec-2005 with crack and cocaine.  She still smokes marijuana to 3 times a week.  She has a drinking.  Military history. Denies.  Education and work history. Patient has worked under 12/21/07 when she had disability.  Family history Patient denies any family member who has any psychiatric illness.  Medical history. Her primary care physician his [facility].  She has obstructive sleep apnea, hypertension, bradycardia Astelin for inability, diabetes mellitus, obesity, iron deficiency anemia and chronic pain.  She uses BiPAP machine.  Patient denies any history of traumatic brain injury,  seizures or any concussion.  Suicidal Ideation: No Plan Formed: No Patient has means to carry out plan: No  Homicidal Ideation: No Plan Formed: No Patient has means to carry out plan: No  Review of Systems: Psychiatric: Agitation: No Hallucination: No Depressed Mood: Yes Insomnia: Yes Hypersomnia: No Altered Concentration: No Feels Worthless: No Grandiose Ideas: No Belief In  Special Powers: No New/Increased Substance Abuse: No Compulsions: No  Neurologic: Headache: Yes Seizure: No Paresthesias: No   Outpatient Encounter Prescriptions as of 04/16/2013  Medication Sig Dispense Refill  . albuterol (PROVENTIL) (2.5 MG/3ML) 0.083% nebulizer solution Take 2.5 mg by nebulization every 6 (six) hours as needed. For shortness of breath      . amLODipine (NORVASC) 5 MG tablet Take 5 mg by mouth 2 (two) times daily.        [name] FLUoxetine (PROZAC) 40 MG capsule Take 1 capsule a day  30 capsule  0  . hydrOXYzine (VISTARIL) 100 MG capsule Take 1 capsule (100 mg total) by mouth at bedtime.  30 capsule  0  . loratadine (CLARITIN) 10 MG tablet Take 10 mg by mouth daily.        [name] losartan (COZAAR) 100 MG tablet Take 100 mg by mouth daily.        . metFORMIN (GLUCOPHAGE) 1000 MG tablet Take 1,000 mg by mouth 2 (two) times daily with a meal.      . [DISCONTINUED] FLUoxetine (PROZAC) 10 MG capsule Take 3 capsule a day  90 capsule  0  . [DISCONTINUED] hydrOXYzine (VISTARIL) 50 MG capsule Take 1 capsule (50 mg total) by mouth 2 (two) times daily.  60 capsule  0  . [DISCONTINUED] naproxen (NAPROSYN) 375 MG tablet Take 1 tablet (375 mg total) by mouth 2 (two) times daily.  20 tablet  0  . [DISCONTINUED] traMADol (ULTRAM) 50 MG tablet Take 1 tablet (50 mg total) by mouth every 6 (six) hours as needed for pain.  15 tablet  0   No facility-administered encounter medications on file as of 04/16/2013.    No results found for this or any previous visit (from the past 2160 hour(s)).   Physical Exam: Constitutional:  BP 140/94  Pulse 72  Resp 24  Ht 4\' 11"  (1.499 m)  Wt 235 lb 12.8 oz (106.958 kg)  BMI 47.6 kg/m2  Musculoskeletal: Strength & Muscle Tone: within normal limits Gait & Station: normal Patient leans: N/A  Mental Status Examination;  Patient is a morbidly obese female who is casually dressed and fairly groomed.  She appears anxious and tearful.  She is easily crying  about her stressors and living situation.  She described her mood as sad and depressed and her affect is constricted.  She denies any active or passive suicidal thoughts or homicidal thoughts.  She denies any auditory or visual hallucination.  Her speech is slow but clear and coherent.  Her thought processes also slow.  There were no flight of ideas or any loose association.  There were no paranoia or delusions present at this time.  Her attention and concentration is fair.  There were no tremors or shakes.  Her fund of knowledge is fair.  She is alert and oriented x3.  Her insight judgment and impulse control is okay.   Medical Decision Making (Choose Three): Review of Psycho-Social Stressors (1), Review or order clinical lab tests (1), Review and summation of old records (2), Established Problem, Worsening (2), Review of Medication Regimen & Side Effects (2) and Review of  New Medication or Change in Dosage (2)  Assessment: Axis I: Maj. depressive disorder, recurrent  Axis II: Deferred  Axis III:  Past Medical History  Diagnosis Date  . Hypertension   . Asthma   . Iron deficiency anemia due to chronic blood loss   . Menometrorrhagia   . Psoriasis   . Sleep apnea   . 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 Obesity 12/18/2012  . Depression   . Anxiety     Axis IV: Moderate   Plan:  I recommend to try Prozac 40 mg to help residual depression .  I will also increase her Vistaril to 100 mg to help her sleep.  Discussed in detail the risks and benefits of medication and recommend to see her therapist for counseling.  Recommend to call us back if she has any question or any concern.  Followup in 4 weeks. Time spent 25 minutes.  More than 50% of the time spent in psychoeducation, counseling and coordination of care.  Discuss safety plan that anytime having active suicidal thoughts or homicidal thoughts then patient need to call 911 or go to the local emergency room.   Delan Ksiazek T.,  MD 04/16/2013

## 2013-04-22 ENCOUNTER — Ambulatory Visit (HOSPITAL_COMMUNITY): Payer: Self-pay | Admitting: Psychiatry

## 2013-04-29 ENCOUNTER — Ambulatory Visit (HOSPITAL_COMMUNITY): Payer: Self-pay | Admitting: Psychiatry

## 2013-05-06 ENCOUNTER — Encounter (INDEPENDENT_AMBULATORY_CARE_PROVIDER_SITE_OTHER): Payer: Self-pay

## 2013-05-06 ENCOUNTER — Ambulatory Visit (INDEPENDENT_AMBULATORY_CARE_PROVIDER_SITE_OTHER): Payer: Medicare Other | Admitting: Psychiatry

## 2013-05-06 DIAGNOSIS — F411 Generalized anxiety disorder: Secondary | ICD-10-CM

## 2013-05-06 DIAGNOSIS — F331 Major depressive disorder, recurrent, moderate: Secondary | ICD-10-CM

## 2013-05-06 NOTE — Progress Notes (Signed)
   THERAPIST PROGRESS NOTE  Session Time: 1:00-2:00 pm  Participation Level: Active  Behavioral Response: CasualAlertDepressed  Type of Therapy: Individual Therapy  Treatment Goals addressed: Coping  Interventions: Supportive  Summary: Cassandra Burns is a 47 y.o. female who presents with tearful mood and affect. Pt described her daughters labor and delivery from last week and broke down in tears when she described the birth complications that led to her granddaughter having the umbilical chord wrapped around her neck and went without breathing for several minutes. Pt said this is the first time she has cried about it and her granddaughter is still in the ICU at the hospital. Pt said the child appears to be developing fine and Pt used the session to relieve feelings associated with this experience.    Suicidal/Homicidal: Nowithout intent/plan  Therapist Response: Assessed overall level of depression and provided supportive counseling.   Plan: Return again in 1 weeks.  Diagnosis: Axis I: Depression Major Recurrent moderate and Anxiety NOS    Axis II: No diagnosis    Etherine Mackowiak E, LCSW 05/06/2013

## 2013-05-13 ENCOUNTER — Encounter (INDEPENDENT_AMBULATORY_CARE_PROVIDER_SITE_OTHER): Payer: Self-pay

## 2013-05-13 ENCOUNTER — Ambulatory Visit (INDEPENDENT_AMBULATORY_CARE_PROVIDER_SITE_OTHER): Payer: Medicare Other | Admitting: Psychiatry

## 2013-05-13 DIAGNOSIS — F331 Major depressive disorder, recurrent, moderate: Secondary | ICD-10-CM

## 2013-05-13 NOTE — Progress Notes (Signed)
   THERAPIST PROGRESS NOTE  Session Time: 1:30-2:30 pm  Participation Level: Active  Behavioral Response: CasualAlertEuthymic  Type of Therapy: Individual Therapy  Treatment Goals addressed: Coping  Interventions: CBT, Strength-based and Supportive  Summary: Cassandra Burns is a 47 y.o. female who presents with euthymic mood and affect and said she is feeling "at peace" and "hopeful" about her granddaughters recovery in the NICU at [facility]. Pt gave an update on her granddaughters progress and states she continues to thrive and do well. Pt said her flashbacks of the labor are reducing and when Pt does have one she re-directs her focus to her granddaughter today and how healthy she is. Pt said her relationship with her boyfriend has been put on the back for now as her family is her main priority. Pt said she feels better and is continuing to manage her depression.    Suicidal/Homicidal: Nowithout intent/plan  Therapist Response: Assessed overall level of functioning and reviewed flashbacks and educated on a mindfulness skill to help Pt get into the present moment when they occur.   Plan: Return again in 1 weeks.  Diagnosis: Axis I: Depression Major Recurrent Moderate    Axis II: No diagnosis    Gayla Benn E, LCSW 05/13/2013

## 2013-05-14 ENCOUNTER — Ambulatory Visit (INDEPENDENT_AMBULATORY_CARE_PROVIDER_SITE_OTHER): Payer: Medicare Other | Admitting: Psychiatry

## 2013-05-14 ENCOUNTER — Encounter (HOSPITAL_COMMUNITY): Payer: Self-pay | Admitting: Psychiatry

## 2013-05-14 VITALS — Wt 232.0 lb

## 2013-05-14 DIAGNOSIS — F339 Major depressive disorder, recurrent, unspecified: Secondary | ICD-10-CM

## 2013-05-14 DIAGNOSIS — F332 Major depressive disorder, recurrent severe without psychotic features: Secondary | ICD-10-CM

## 2013-05-14 MED ORDER — FLUOXETINE HCL 40 MG PO CAPS: ORAL_CAPSULE | ORAL | Status: DC

## 2013-05-14 MED ORDER — HYDROXYZINE PAMOATE 100 MG PO CAPS: 100.0000 mg | ORAL_CAPSULE | Freq: Every day | ORAL | Status: DC

## 2013-05-14 NOTE — Progress Notes (Signed)
[facility] [id number] Progress Note  Cassandra Burns [id number] 47 y.o.  05/14/2013 12:17 PM  Chief Complaint:  I am feeling tired.        History of Present Illness:  Patient is a 47 year old single [place] American female who came for her followup appointment.  Patient appears very tired.  Her daughter deliver the baby in the beginning of this month however due to complication during labor , the newborn arm was broken and admitted to the NICU .  Patient and her daughter is very anxious and going to the hospital that he frequently.  Patient appears very tired but overall feeling better since increase in Prozac and Vistaril.  She was to continue her current psychiatric medication.  She is seeing therapist in the office.  Her sleep is also improved from the past.  She denies any suicidal thoughts or homicidal thoughts.  She has a tremors or shakes.  Past psychiatric history. Patient denies any history of psychiatric inpatient treatment.  She start seeing psychiatrist in 2005/12/26 in [place], [place].  At that time she was using crack and cocaine and decided to stop and get some help.  She denies any suicidal attempt, paranoia however admitted passive suicidal thinking on and off.  She admitted history of scratches on her torso when she was using crack and cocaine , presumably self-inflicted but she never tried to kill herself.  She took Lexapro but some good response.  However she gained weight.  She finished intensive outpatient program in June .  She denies any history of mania, aggression, violence  Or psychosis.  Patient denies any history of PTSD, flashbacks or nightmares.  Patient denies any history of electroconvulsive treatment.  History of abuse. Patient denies any history of physical, sexual, verbal or emotional abuse.  Psychosocial history. Patient was born and raised in [place], [place].  She has a big family.  Her grandmother has 13 children.  She has  multiple cousins.  She has 2 children from 2 different relationship.  Patient has no contact with the father of children.  She was very close to her mother who died in 2003/12/27.  In 12/26/08 she moved to [place] to live close to her cousin.  Her cousin then sent to jail because selling pain pills.  His cousin moved to [place].  Patient has a lot of family member who lives in  however she does not want to go back.  Alcohol and substance use is treated Patient admitted history of addiction with crack and cocaine in the past.  However she claims to be sober since 26-Dec-2005 with crack and cocaine.  She still smokes marijuana to 3 times a week.  She has a drinking.  Military history. Denies.  Education and work history. Patient has worked under 12/27/2007 when she had disability.  Family history Patient denies any family member who has any psychiatric illness.  Medical history. Her primary care physician his [facility].  She has obstructive sleep apnea, hypertension, bradycardia Astelin for inability, diabetes mellitus, obesity, iron deficiency anemia and chronic pain.  She uses BiPAP machine.  Patient denies any history of traumatic brain injury, seizures or any concussion.  Suicidal Ideation: No Plan Formed: No Patient has means to carry out plan: No  Homicidal Ideation: No Plan Formed: No Patient has means to carry out plan: No  Review of Systems: Psychiatric: Agitation: No Hallucination: No Depressed Mood: Yes Insomnia: Yes Hypersomnia: No Altered Concentration: No Feels Worthless: No Grandiose Ideas:  No Belief In Special Powers: No New/Increased Substance Abuse: No Compulsions: No  Neurologic: Headache: Yes Seizure: No Paresthesias: No   Outpatient Encounter Prescriptions as of 05/14/2013  Medication Sig Dispense Refill  . amLODipine (NORVASC) 5 MG tablet Take 5 mg by mouth 2 (two) times daily.        [name] FLUoxetine (PROZAC) 40 MG capsule Take 1 capsule  a day  30 capsule  1  . hydrOXYzine (VISTARIL) 100 MG capsule Take 1 capsule (100 mg total) by mouth at bedtime.  30 capsule  0  . loratadine (CLARITIN) 10 MG tablet Take 10 mg by mouth daily.        [name] losartan (COZAAR) 100 MG tablet Take 100 mg by mouth daily.        . metFORMIN (GLUCOPHAGE) 1000 MG tablet Take 1,000 mg by mouth 2 (two) times daily with a meal.      . [DISCONTINUED] FLUoxetine (PROZAC) 40 MG capsule Take 1 capsule a day  30 capsule  0  . [DISCONTINUED] hydrOXYzine (VISTARIL) 100 MG capsule Take 1 capsule (100 mg total) by mouth at bedtime.  30 capsule  0  . [DISCONTINUED] albuterol (PROVENTIL) (2.5 MG/3ML) 0.083% nebulizer solution Take 2.5 mg by nebulization every 6 (six) hours as needed. For shortness of breath       No facility-administered encounter medications on file as of 05/14/2013.    No results found for this or any previous visit (from the past 2160 hour(s)).   Physical Exam: Constitutional:  Wt 232 lb (105.235 kg)  BMI 46.83 kg/m2  Musculoskeletal: Strength & Muscle Tone: within normal limits Gait & Station: normal Patient leans: N/A  Mental Status Examination;  Patient is a morbidly obese female who is casually dressed and fairly groomed.  She appears tired.  She described her mood anxious and tied and her affect is mood appropriate.  She denies any active or passive suicidal thoughts or homicidal thoughts.  She denies any auditory or visual hallucination.  Her speech is slow but clear and coherent.  Her thought processes also slow.  There were no flight of ideas or any loose association.  There were no paranoia or delusions present at this time.  Her attention and concentration is fair.  There were no tremors or shakes.  Her fund of knowledge is fair.  She is alert and oriented x3.  Her insight judgment and impulse control is okay.   Medical Decision Making (Choose Three): Established Problem, Stable/Improving (1), Review of Psycho-Social Stressors (1),  Review of Last Therapy Session (1) and Review of New Medication or Change in Dosage (2)  Assessment: Axis I: Maj. depressive disorder, recurrent  Axis II: Deferred  Axis III:  Past Medical History  Diagnosis Date  . Hypertension   . Asthma   . Iron deficiency anemia due to chronic blood loss   . Menometrorrhagia   . Psoriasis   . Sleep apnea   . 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 Obesity 12/18/2012  . Depression   . Anxiety     Axis IV: Moderate   Plan:  I will continue Prozac 40 mg daily and Vistaril 100 mg at bedtime.  Record to see therapist for coping and social skills.  Recommend because Donalee Citrin she is a question of any concerns.  Followup in 2 months.   ARFEEN,SYED T., MD 05/14/2013

## 2013-05-20 ENCOUNTER — Ambulatory Visit (HOSPITAL_COMMUNITY): Payer: Self-pay | Admitting: Psychiatry

## 2013-05-27 ENCOUNTER — Ambulatory Visit (HOSPITAL_COMMUNITY): Payer: Self-pay | Admitting: Psychiatry

## 2013-06-03 ENCOUNTER — Ambulatory Visit (INDEPENDENT_AMBULATORY_CARE_PROVIDER_SITE_OTHER): Payer: Medicare Other | Admitting: Psychiatry

## 2013-06-03 DIAGNOSIS — F331 Major depressive disorder, recurrent, moderate: Secondary | ICD-10-CM

## 2013-06-03 NOTE — Progress Notes (Signed)
   THERAPIST PROGRESS NOTE  Session Time: 2:30-3:20 pm  Participation Level: Active  Behavioral Response: CasualAlertEuthymic  Type of Therapy: Individual Therapy  Treatment Goals addressed: Coping  Interventions: Solution Focused and Supportive  Summary: Cassandra Burns is a 47 y.o. female who presents with euthymic mood and affect and reports feeling "good". Pt was smiling and said her granddaughter was able to leave the [facility] and go home for the first time since being born. Pt said this entire experience has giving her a new experience on life. Pt said her relationship to her boyfriend is going well and that is put into perspective as well and she is not looking for too much from the relationship. Pt is clear minded and goal directed with continuing with her wellness.   Suicidal/Homicidal: Nowithout intent/plan  Therapist Response: Assessed overall level of functioning and provided supportive therapy regarding her granddaughter.   Plan: Return again in 2 weeks.  Diagnosis: Axis I: Depression Major Recurrent Moderate    Axis II: No diagnosis    Daphnee Preiss E, LCSW 06/03/2013

## 2013-06-04 ENCOUNTER — Other Ambulatory Visit: Payer: Self-pay | Admitting: Hematology and Oncology

## 2013-06-04 ENCOUNTER — Telehealth: Payer: Self-pay | Admitting: Hematology and Oncology

## 2013-06-04 DIAGNOSIS — N921 Excessive and frequent menstruation with irregular cycle: Secondary | ICD-10-CM

## 2013-06-04 NOTE — Telephone Encounter (Signed)
s.w. pt and advised on 11.10.14 lab and est

## 2013-06-07 ENCOUNTER — Encounter: Payer: Self-pay | Admitting: Hematology and Oncology

## 2013-06-07 ENCOUNTER — Ambulatory Visit (HOSPITAL_BASED_OUTPATIENT_CLINIC_OR_DEPARTMENT_OTHER): Payer: Medicare Other | Admitting: Hematology and Oncology

## 2013-06-07 ENCOUNTER — Telehealth: Payer: Self-pay | Admitting: Hematology and Oncology

## 2013-06-07 ENCOUNTER — Other Ambulatory Visit (HOSPITAL_BASED_OUTPATIENT_CLINIC_OR_DEPARTMENT_OTHER): Payer: Medicare Other

## 2013-06-07 VITALS — BP 149/78 | HR 95 | Temp 97.9°F | Resp 18 | Ht 59.0 in | Wt 236.3 lb

## 2013-06-07 DIAGNOSIS — D509 Iron deficiency anemia, unspecified: Secondary | ICD-10-CM

## 2013-06-07 DIAGNOSIS — M898X9 Other specified disorders of bone, unspecified site: Secondary | ICD-10-CM

## 2013-06-07 DIAGNOSIS — N921 Excessive and frequent menstruation with irregular cycle: Secondary | ICD-10-CM

## 2013-06-07 DIAGNOSIS — K625 Hemorrhage of anus and rectum: Secondary | ICD-10-CM

## 2013-06-07 DIAGNOSIS — R591 Generalized enlarged lymph nodes: Secondary | ICD-10-CM

## 2013-06-07 DIAGNOSIS — R599 Enlarged lymph nodes, unspecified: Secondary | ICD-10-CM

## 2013-06-07 DIAGNOSIS — R590 Localized enlarged lymph nodes: Secondary | ICD-10-CM

## 2013-06-07 HISTORY — DX: Other specified disorders of bone, unspecified site: M89.8X9

## 2013-06-07 HISTORY — DX: Generalized enlarged lymph nodes: R59.1

## 2013-06-07 LAB — COMPREHENSIVE METABOLIC PANEL (CC13)
ALT: 17 U/L (ref 0–55)
AST: 17 U/L (ref 5–34)
Albumin: 3.5 g/dL (ref 3.5–5.0)
Alkaline Phosphatase: 96 U/L (ref 40–150)
Calcium: 10.4 mg/dL (ref 8.4–10.4)
Chloride: 103 mEq/L (ref 98–109)
Potassium: 4.1 mEq/L (ref 3.5–5.1)
Sodium: 137 mEq/L (ref 136–145)

## 2013-06-07 LAB — CBC & DIFF AND RETIC
Basophils Absolute: 0 10*3/uL (ref 0.0–0.1)
Eosinophils Absolute: 0.1 10*3/uL (ref 0.0–0.5)
HCT: 35.7 % (ref 34.8–46.6)
HGB: 11 g/dL — ABNORMAL LOW (ref 11.6–15.9)
Immature Retic Fract: 17.7 % — ABNORMAL HIGH (ref 1.60–10.00)
MCH: 21.8 pg — ABNORMAL LOW (ref 25.1–34.0)
MONO#: 0.3 10*3/uL (ref 0.1–0.9)
NEUT#: 2.3 10*3/uL (ref 1.5–6.5)
NEUT%: 58.4 % (ref 38.4–76.8)
Retic %: 2.33 % — ABNORMAL HIGH (ref 0.70–2.10)
Retic Ct Abs: 117.43 10*3/uL — ABNORMAL HIGH (ref 33.70–90.70)
WBC: 3.9 10*3/uL (ref 3.9–10.3)
lymph#: 1.2 10*3/uL (ref 0.9–3.3)

## 2013-06-07 LAB — IRON AND TIBC CHCC
Iron: 57 ug/dL (ref 41–142)
TIBC: 348 ug/dL (ref 236–444)
UIBC: 291 ug/dL (ref 120–384)

## 2013-06-07 LAB — HOLD TUBE, BLOOD BANK

## 2013-06-07 NOTE — Telephone Encounter (Signed)
gave barium to pt for cts per 11/10 POF rs for 2 wks w NG AVS and Cal given to pt shh

## 2013-06-07 NOTE — Progress Notes (Signed)
[facility] OFFICE PROGRESS NOTE  [name], FNP DIAGNOSIS:  Chronic iron deficiency anemia, lymphadenopathy  SUMMARY OF HEMATOLOGIC HISTORY: This is a pleasant 47 year old lady with background history of chronic recurrent iron deficiency anemia. Last colonoscopy from 2012 showed internal hemorrhoids. She has received intravenous iron infusion in 2012 & 2014 She was also found to have abdominal and mediastinal lymphadenopathy of unknown etiology and that has not been worked up fully. INTERVAL HISTORY: Cassandra Burns 47 y.o. female returns for further followup. She complained of daily rectal bleeding of fresh clots. She denies straining of hard stool The patient denies any recent signs or symptoms of bleeding such as spontaneous epistaxis, hematuria or hematochezia. She denies any recent fever, chills, night sweats or abnormal weight loss. Denies any new palpable lymphadenopathy She's been taking oral iron supplement twice a day every day with food. She denies signs and symptoms of anemia apart from just fatigue.  I have reviewed the past medical history, past surgical history, social history and family history with the patient and they are unchanged from previous note.  ALLERGIES:  has No Known Allergies.  MEDICATIONS:  Current Outpatient Prescriptions  Medication Sig Dispense Refill  . amLODipine (NORVASC) 5 MG tablet Take 5 mg by mouth 2 (two) times daily.        [name] FLUoxetine (PROZAC) 40 MG capsule Take 1 capsule a day  30 capsule  1  . hydrOXYzine (VISTARIL) 100 MG capsule Take 1 capsule (100 mg total) by mouth at bedtime.  30 capsule  0  . loratadine (CLARITIN) 10 MG tablet Take 10 mg by mouth daily.        [name] losartan (COZAAR) 100 MG tablet Take 100 mg by mouth daily.        . metFORMIN (GLUCOPHAGE) 1000 MG tablet Take 1,000 mg by mouth 2 (two) times daily with a meal.       No current facility-administered medications for this visit.     REVIEW OF  SYSTEMS:   Constitutional: Denies fevers, chills or night sweats Eyes: Denies blurriness of vision Ears, nose, mouth, throat, and face: Denies mucositis or sore throat Respiratory: Denies cough, dyspnea or wheezes Cardiovascular: Denies palpitation, chest discomfort or lower extremity swelling Gastrointestinal:  Denies nausea, heartburn or change in bowel habits Skin: Denies abnormal skin rashes Lymphatics: Denies new lymphadenopathy or easy bruising Neurological:Denies numbness, tingling or new weaknesses Behavioral/Psych: Mood is stable, no new changes  All other systems were reviewed with the patient and are negative.  PHYSICAL EXAMINATION: ECOG PERFORMANCE STATUS: 1 - Symptomatic but completely ambulatory  Filed Vitals:   06/07/13 0940  BP: 149/78  Pulse: 95  Temp: 97.9 F (36.6 C)  Resp: 18   Filed Weights   06/07/13 0940  Weight: 236 lb 4.8 oz (107.185 kg)    GENERAL:alert, no distress and comfortable. Patient is morbidly obese SKIN: skin color, texture, turgor are normal, no rashes or significant lesions EYES: normal, Conjunctiva are pink and non-injected, sclera clear OROPHARYNX:no exudate, no erythema and lips, buccal mucosa, and tongue normal  NECK: supple, thyroid normal size, non-tender, without nodularity LYMPH:  no palpable lymphadenopathy in the cervical, axillary or inguinal.  LUNGS: clear to auscultation and percussion with normal breathing effort HEART: regular rate & rhythm and no murmurs and no lower extremity edema ABDOMEN:abdomen soft, non-tender and normal bowel sounds Musculoskeletal:no cyanosis of digits and no clubbing  NEURO: alert & oriented x 3 with fluent speech, no focal motor/sensory deficits  rectal exam  palpable internal hemorrhoids with no evidence of fresh blood  LABORATORY DATA:  I have reviewed the data as listed Results for orders placed in visit on 06/07/13 (from the past 48 hour(s))  CBC & DIFF AND RETIC     Status: Abnormal    Collection Time    06/07/13  9:17 AM      Result Value Range   WBC 3.9  3.9 - 10.3 10e3/uL   NEUT# 2.3  1.5 - 6.5 10e3/uL   HGB 11.0 (*) 11.6 - 15.9 g/dL   HCT 14.7  82.9 - 56.2 %   Platelets 215  145 - 400 10e3/uL   MCV 70.8 (*) 79.5 - 101.0 fL   MCH 21.8 (*) 25.1 - 34.0 pg   MCHC 30.8 (*) 31.5 - 36.0 g/dL   RBC 1.30  8.65 - 7.84 10e6/uL   RDW 17.7 (*) 11.2 - 14.5 %   lymph# 1.2  0.9 - 3.3 10e3/uL   MONO# 0.3  0.1 - 0.9 10e3/uL   Eosinophils Absolute 0.1  0.0 - 0.5 10e3/uL   Basophils Absolute 0.0  0.0 - 0.1 10e3/uL   NEUT% 58.4  38.4 - 76.8 %   LYMPH% 30.1  14.0 - 49.7 %   MONO% 8.6  0.0 - 14.0 %   EOS% 2.6  0.0 - 7.0 %   BASO% 0.3  0.0 - 2.0 %   nRBC 0  0 - 0 %   Retic % 2.33 (*) 0.70 - 2.10 %   Retic Ct Abs 117.43 (*) 33.70 - 90.70 10e3/uL   Immature Retic Fract 17.70 (*) 1.60 - 10.00 %      RADIOGRAPHIC STUDIES: I have personally reviewed the radiological images as listed and agreed with the findings in the report. Her last imaging study is early this year which show mediastinal and abdominal lymphadenopathy   ASSESSMENT:  #1 recurrent iron deficiency anemia #2 daily active rectal bleeding #3 lymphadenopathy of unknown etiology   PLAN #1 recurrent iron deficiency anemia I am concerned about this. I'm awaiting for her ferritin level to come back. If her ferritin came back less than 50, we will repeat another intravenous iron infusion. We discussed some of the risks, benefits, and alternatives of intravenous iron infusions. The patient is symptomatic from anemia and the iron level is critically low. She tolerated oral iron supplement poorly and desires to achieved higher levels of iron faster for adequate hematopoesis. Some of the side-effects to be expected including risks of infusion reactions, phlebitis, headaches, nausea and fatigue.  The patient is willing to proceed. #2 daily active rectal bleeding The patient may need another colonoscopy. I would like to defer  referring her back to the surgeon until I have CT scan results #3 lymphadenopathy of unknown etiology  Her last CT scan is over 6 months ago. I'm concerned about possible diagnosis of cancer. I recommend repeating another CT scan of the chest, abdomen, and pelvis and she is in agreement to proceed. I will see her back in 2 weeks for further assessment.  All questions were answered. The patient knows to call the clinic with any problems, questions or concerns. No barriers to learning was detected.    Ziza Hastings, MD 06/07/2013 10:07 AM

## 2013-06-08 LAB — DIRECT ANTIGLOBULIN TEST (NOT AT ARMC): DAT IgG: NEGATIVE

## 2013-06-11 ENCOUNTER — Ambulatory Visit (HOSPITAL_COMMUNITY): Payer: Self-pay | Admitting: Psychiatry

## 2013-06-18 ENCOUNTER — Ambulatory Visit (HOSPITAL_COMMUNITY)
Admission: RE | Admit: 2013-06-18 | Discharge: 2013-06-18 | Disposition: A | Discharge status: Home / Self Care | Payer: Medicare Other | Source: Ambulatory Visit | Attending: Hematology and Oncology | Admitting: Hematology and Oncology

## 2013-06-18 DIAGNOSIS — R590 Localized enlarged lymph nodes: Secondary | ICD-10-CM

## 2013-06-18 DIAGNOSIS — J8410 Pulmonary fibrosis, unspecified: Secondary | ICD-10-CM | POA: Insufficient documentation

## 2013-06-18 DIAGNOSIS — M898X9 Other specified disorders of bone, unspecified site: Secondary | ICD-10-CM

## 2013-06-18 DIAGNOSIS — R591 Generalized enlarged lymph nodes: Secondary | ICD-10-CM

## 2013-06-18 DIAGNOSIS — I700 Atherosclerosis of aorta: Secondary | ICD-10-CM | POA: Insufficient documentation

## 2013-06-18 DIAGNOSIS — D259 Leiomyoma of uterus, unspecified: Secondary | ICD-10-CM | POA: Insufficient documentation

## 2013-06-18 DIAGNOSIS — K7689 Other specified diseases of liver: Secondary | ICD-10-CM | POA: Insufficient documentation

## 2013-06-18 DIAGNOSIS — R599 Enlarged lymph nodes, unspecified: Secondary | ICD-10-CM | POA: Insufficient documentation

## 2013-06-18 DIAGNOSIS — M899 Disorder of bone, unspecified: Secondary | ICD-10-CM | POA: Insufficient documentation

## 2013-06-18 MED ORDER — IOHEXOL 300 MG/ML  SOLN
100.0000 mL | Freq: Once | INTRAMUSCULAR | Status: AC | PRN
  Administered 2013-06-18: 100 mL via INTRAVENOUS

## 2013-06-21 ENCOUNTER — Ambulatory Visit: Payer: Self-pay | Admitting: Hematology and Oncology

## 2013-06-21 ENCOUNTER — Encounter: Payer: Self-pay | Admitting: *Deleted

## 2013-07-02 ENCOUNTER — Ambulatory Visit (HOSPITAL_COMMUNITY): Payer: Self-pay | Admitting: Psychiatry

## 2013-07-05 ENCOUNTER — Telehealth: Payer: Self-pay | Admitting: Hematology and Oncology

## 2013-07-05 NOTE — Telephone Encounter (Signed)
Pt called and r/s md visit from 11/24 to 12/29 md only

## 2013-07-08 ENCOUNTER — Ambulatory Visit (INDEPENDENT_AMBULATORY_CARE_PROVIDER_SITE_OTHER): Payer: Medicare Other | Admitting: Psychiatry

## 2013-07-08 DIAGNOSIS — F411 Generalized anxiety disorder: Secondary | ICD-10-CM

## 2013-07-08 DIAGNOSIS — F331 Major depressive disorder, recurrent, moderate: Secondary | ICD-10-CM

## 2013-07-08 NOTE — Progress Notes (Signed)
   THERAPIST PROGRESS NOTE  Session Time: 1:30-2:15 pm  Participation Level: Active  Behavioral Response: Neat and Well GroomedAlertAnxious  Type of Therapy: Individual Therapy  Treatment Goals addressed: Diagnosis: Depression and Anxiety  Interventions: Solution Focused and Supportive  Summary: Cassandra Burns is a 47 y.o. female who presents with moderate anxious mood and mild depression symptoms. Pt said she is feeling more overwhelmed lately due to having two infants in her home, her granddaughter and now her nieces daughter who was born last week. Pt said she feels the pressure to help support her family and the two new babies and she said her son is scheduled to have his child within the next two weeks. Pt processed feelings of overwhelm and talked about how she is engaging in self-care techniques to manage the stress. Pt said her depression symptoms are minimal and she is maintaining. Pt said talking helped reduce her stress. Pt said a new stressor to be discussed next session involves her ex-boyfriend who has been in prison for the past several years and has a cocaine addiction is scheduled for release in June and wants to get back together with Pt. Pt is unsure what to do with him, because she said she "still loves him", but does not want him to bring chaos into her home.   Suicidal/Homicidal: Nowithout intent/plan  Therapist Response: worked on goals of depression and anxiety, Assessed overall depression and anxiety symptoms per Pt self report, explored triggers for symptoms. Revived coping skills for stress management and discussed ways to use boundaries to ensure wellness.   Plan: Return again in 1 weeks.  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

## 2013-07-15 ENCOUNTER — Ambulatory Visit (HOSPITAL_COMMUNITY): Payer: Self-pay | Admitting: Psychiatry

## 2013-07-26 ENCOUNTER — Encounter: Payer: Self-pay | Admitting: Hematology and Oncology

## 2013-07-26 ENCOUNTER — Telehealth: Payer: Self-pay | Admitting: Hematology and Oncology

## 2013-07-26 ENCOUNTER — Ambulatory Visit (HOSPITAL_BASED_OUTPATIENT_CLINIC_OR_DEPARTMENT_OTHER): Payer: Medicare Other | Admitting: Hematology and Oncology

## 2013-07-26 VITALS — BP 179/94 | HR 95 | Temp 97.6°F | Resp 20 | Ht 59.0 in | Wt 235.5 lb

## 2013-07-26 DIAGNOSIS — M898X9 Other specified disorders of bone, unspecified site: Secondary | ICD-10-CM

## 2013-07-26 DIAGNOSIS — R599 Enlarged lymph nodes, unspecified: Secondary | ICD-10-CM

## 2013-07-26 DIAGNOSIS — M899 Disorder of bone, unspecified: Secondary | ICD-10-CM

## 2013-07-26 DIAGNOSIS — K649 Unspecified hemorrhoids: Secondary | ICD-10-CM

## 2013-07-26 DIAGNOSIS — D539 Nutritional anemia, unspecified: Secondary | ICD-10-CM | POA: Insufficient documentation

## 2013-07-26 DIAGNOSIS — D860 Sarcoidosis of lung: Secondary | ICD-10-CM | POA: Insufficient documentation

## 2013-07-26 DIAGNOSIS — D509 Iron deficiency anemia, unspecified: Secondary | ICD-10-CM

## 2013-07-26 DIAGNOSIS — R591 Generalized enlarged lymph nodes: Secondary | ICD-10-CM

## 2013-07-26 DIAGNOSIS — D869 Sarcoidosis, unspecified: Secondary | ICD-10-CM

## 2013-07-26 DIAGNOSIS — Z862 Personal history of diseases of the blood and blood-forming organs and certain disorders involving the immune mechanism: Secondary | ICD-10-CM

## 2013-07-26 HISTORY — DX: Nutritional anemia, unspecified: D53.9

## 2013-07-26 HISTORY — DX: Personal history of diseases of the blood and blood-forming organs and certain disorders involving the immune mechanism: Z86.2

## 2013-07-26 HISTORY — DX: Sarcoidosis, unspecified: D86.9

## 2013-07-26 NOTE — Progress Notes (Signed)
[facility] OFFICE PROGRESS NOTE  No PCP Per Patient DIAGNOSIS:  Microcytic anemia, lymphadenopathy, possible diagnosis of sarcoidosis  SUMMARY OF HEMATOLOGIC HISTORY: This is a pleasant 47 year old lady with background history I'm deficiency anemia, likely secondary to chronic GI bleed from hemorrhoids. Colonoscopy from 2012 confirmed the diagnosis of internal hemorrhoids. The patient was also found to have lymphadenopathy in the mediastinum and abdomen and she was told she may have diagnosis of sarcoidosis. INTERVAL HISTORY: Cassandra Burns 47 y.o. female returns for further followup. She felt well. Denies any recent cough. No recent bleeding.  I have reviewed the past medical history, past surgical history, social history and family history with the patient and they are unchanged from previous note.  ALLERGIES:  has No Known Allergies.  MEDICATIONS:  Current Outpatient Prescriptions  Medication Sig Dispense Refill  . amLODipine (NORVASC) 5 MG tablet Take 5 mg by mouth 2 (two) times daily.        [name] FLUoxetine (PROZAC) 40 MG capsule Take 1 capsule a day  30 capsule  1  . hydrOXYzine (VISTARIL) 100 MG capsule Take 1 capsule (100 mg total) by mouth at bedtime.  30 capsule  0  . loratadine (CLARITIN) 10 MG tablet Take 10 mg by mouth daily.        [name] losartan (COZAAR) 100 MG tablet Take 100 mg by mouth daily.        . metFORMIN (GLUCOPHAGE) 1000 MG tablet Take 1,000 mg by mouth 2 (two) times daily with a meal.       No current facility-administered medications for this visit.     REVIEW OF SYSTEMS:   Constitutional: Denies fevers, chills or night sweats Behavioral/Psych: Mood is stable, no new changes  All other systems were reviewed with the patient and are negative.  PHYSICAL EXAMINATION: ECOG PERFORMANCE STATUS: 0 - Asymptomatic  Filed Vitals:   07/26/13 0834  BP: 179/94  Pulse: 95  Temp: 97.6 F (36.4 C)  Resp: 20   Filed Weights   07/26/13 0834   Weight: 235 lb 8 oz (106.822 kg)    GENERAL:alert, no distress and comfortable Musculoskeletal:no cyanosis of digits and no clubbing  NEURO: alert & oriented x 3 with fluent speech, no focal motor/sensory deficits  LABORATORY DATA:  I have reviewed the data as listed No results found for this or any previous visit (from the past 48 hour(s)).  Lab Results  Component Value Date   WBC 3.9 06/07/2013   HGB 11.0* 06/07/2013   HCT 35.7 06/07/2013   MCV 70.8* 06/07/2013   PLT 215 06/07/2013    RADIOGRAPHIC STUDIES: I reviewed the CT scan with her and family I have personally reviewed the radiological images as listed and agreed with the findings in the report.   ASSESSMENT & PLAN:  #1 lymphadenopathy Certainly diagnosis of sarcoidosis can be entertained. The size of the lymph nodes have not changed in the last 10 months I will draw as ACE level with her next visit. Lymphoma cannot be excluded especially with the evidence of liver and spleen enlargement. I will order a PET scan to restage her with her next visit #2 chronic microcytic anemia Certainly iron deficiency from chronic GI bleed could be a cause. I will order hemoglobinopathy evaluation with her next visit. Her most recent ferritin level was 51 and she does not require further intravenous iron infusion #3 active rectal bleeding This is to to hemorrhoids. I recommend over-the-counter stool softener #4 bone pain I will order  workup to rule out monoclonal paraproteinemia with the next visit. I recommend vitamin D supplement All questions were answered. The patient knows to call the clinic with any problems, questions or concerns. No barriers to learning was detected.    Cassandra Weilbacher, MD 07/26/2013 9:27 AM

## 2013-07-26 NOTE — Telephone Encounter (Signed)
Gave pt appt for lab and MD on March 2015 °

## 2013-07-30 ENCOUNTER — Other Ambulatory Visit: Payer: Self-pay | Admitting: Hematology and Oncology

## 2013-07-30 ENCOUNTER — Ambulatory Visit (HOSPITAL_COMMUNITY): Payer: Self-pay | Admitting: Psychiatry

## 2013-07-30 DIAGNOSIS — D869 Sarcoidosis, unspecified: Secondary | ICD-10-CM

## 2013-08-13 ENCOUNTER — Ambulatory Visit (HOSPITAL_COMMUNITY): Payer: Self-pay | Admitting: Psychiatry

## 2013-08-19 ENCOUNTER — Ambulatory Visit (HOSPITAL_COMMUNITY): Payer: Self-pay | Admitting: Psychiatry

## 2013-08-27 ENCOUNTER — Ambulatory Visit (HOSPITAL_COMMUNITY): Payer: Self-pay | Admitting: Psychiatry

## 2013-09-06 ENCOUNTER — Ambulatory Visit: Payer: Medicare Other | Admitting: Oncology

## 2013-09-06 ENCOUNTER — Other Ambulatory Visit: Payer: Medicare Other | Admitting: Lab

## 2013-10-15 ENCOUNTER — Other Ambulatory Visit (HOSPITAL_BASED_OUTPATIENT_CLINIC_OR_DEPARTMENT_OTHER): Payer: Medicare Other

## 2013-10-15 ENCOUNTER — Ambulatory Visit (HOSPITAL_COMMUNITY)
Admission: RE | Admit: 2013-10-15 | Discharge: 2013-10-15 | Disposition: A | Discharge status: Home / Self Care | Payer: Medicare Other | Source: Ambulatory Visit | Attending: Hematology and Oncology | Admitting: Hematology and Oncology

## 2013-10-15 DIAGNOSIS — D869 Sarcoidosis, unspecified: Secondary | ICD-10-CM

## 2013-10-15 DIAGNOSIS — C8589 Other specified types of non-Hodgkin lymphoma, extranodal and solid organ sites: Secondary | ICD-10-CM | POA: Insufficient documentation

## 2013-10-15 DIAGNOSIS — M898X9 Other specified disorders of bone, unspecified site: Secondary | ICD-10-CM

## 2013-10-15 DIAGNOSIS — D539 Nutritional anemia, unspecified: Secondary | ICD-10-CM

## 2013-10-15 DIAGNOSIS — R591 Generalized enlarged lymph nodes: Secondary | ICD-10-CM

## 2013-10-15 DIAGNOSIS — D509 Iron deficiency anemia, unspecified: Secondary | ICD-10-CM

## 2013-10-15 LAB — FERRITIN CHCC: Ferritin: 36 ng/ml (ref 9–269)

## 2013-10-15 LAB — IRON AND TIBC CHCC
%SAT: 15 % — AB (ref 21–57)
IRON: 55 ug/dL (ref 41–142)
TIBC: 359 ug/dL (ref 236–444)
UIBC: 304 ug/dL (ref 120–384)

## 2013-10-15 LAB — GLUCOSE, CAPILLARY: Glucose-Capillary: 190 mg/dL — ABNORMAL HIGH (ref 70–99)

## 2013-10-15 MED ORDER — FLUDEOXYGLUCOSE F - 18 (FDG) INJECTION
12.4000 | Freq: Once | INTRAVENOUS | Status: AC | PRN
  Administered 2013-10-15: 12.4 via INTRAVENOUS

## 2013-10-20 LAB — SPEP & IFE WITH QIG
Albumin ELP: 48 % — ABNORMAL LOW (ref 55.8–66.1)
Alpha-1-Globulin: 9.4 % — ABNORMAL HIGH (ref 2.9–4.9)
Alpha-2-Globulin: 9.1 % (ref 7.1–11.8)
Beta 2: 4.5 % (ref 3.2–6.5)
Beta Globulin: 6.9 % (ref 4.7–7.2)
Gamma Globulin: 22.1 % — ABNORMAL HIGH (ref 11.1–18.8)
IgA: 264 mg/dL (ref 69–380)
IgG (Immunoglobin G), Serum: 1650 mg/dL (ref 690–1700)
IgM, Serum: 529 mg/dL — ABNORMAL HIGH (ref 52–322)
Total Protein, Serum Electrophoresis: 7.4 g/dL (ref 6.0–8.3)

## 2013-10-20 LAB — HEMOGLOBINOPATHY EVALUATION
HGB S QUANTITAION: 0 %
Hemoglobin Other: 0 %
Hgb A2 Quant: 0.9 % — ABNORMAL LOW (ref 2.2–3.2)
Hgb A: 99.1 % — ABNORMAL HIGH (ref 96.8–97.8)
Hgb F Quant: 0 % (ref 0.0–2.0)

## 2013-10-20 LAB — ANGIOTENSIN CONVERTING ENZYME: Angiotensin 1 CE: 38 U/L (ref 8–52)

## 2013-10-20 LAB — VITAMIN D 25 HYDROXY (VIT D DEFICIENCY, FRACTURES): Vit D, 25-Hydroxy: 16 ng/mL — ABNORMAL LOW (ref 30–89)

## 2013-10-20 LAB — KAPPA/LAMBDA LIGHT CHAINS
Kappa free light chain: 2.62 mg/dL — ABNORMAL HIGH (ref 0.33–1.94)
Kappa:Lambda Ratio: 1.19 (ref 0.26–1.65)
LAMBDA FREE LGHT CHN: 2.21 mg/dL (ref 0.57–2.63)

## 2013-10-20 LAB — BETA 2 MICROGLOBULIN, SERUM: Beta-2 Microglobulin: 3.3 mg/L — ABNORMAL HIGH (ref <=2.51)

## 2013-10-25 ENCOUNTER — Ambulatory Visit: Payer: Medicare Other

## 2013-10-25 ENCOUNTER — Encounter: Payer: Self-pay | Admitting: Hematology and Oncology

## 2013-10-25 ENCOUNTER — Telehealth: Payer: Self-pay | Admitting: Hematology and Oncology

## 2013-10-25 ENCOUNTER — Ambulatory Visit (HOSPITAL_BASED_OUTPATIENT_CLINIC_OR_DEPARTMENT_OTHER): Payer: Medicare Other | Admitting: Hematology and Oncology

## 2013-10-25 ENCOUNTER — Other Ambulatory Visit (HOSPITAL_COMMUNITY)
Admission: RE | Admit: 2013-10-25 | Discharge: 2013-10-25 | Disposition: A | Discharge status: Home / Self Care | Payer: Medicare Other | Source: Ambulatory Visit | Attending: Hematology and Oncology | Admitting: Hematology and Oncology

## 2013-10-25 VITALS — BP 151/85 | HR 116 | Temp 97.9°F | Resp 20 | Ht 59.0 in | Wt 231.9 lb

## 2013-10-25 DIAGNOSIS — R591 Generalized enlarged lymph nodes: Secondary | ICD-10-CM

## 2013-10-25 DIAGNOSIS — K469 Unspecified abdominal hernia without obstruction or gangrene: Secondary | ICD-10-CM | POA: Insufficient documentation

## 2013-10-25 DIAGNOSIS — K439 Ventral hernia without obstruction or gangrene: Secondary | ICD-10-CM

## 2013-10-25 DIAGNOSIS — K649 Unspecified hemorrhoids: Secondary | ICD-10-CM

## 2013-10-25 DIAGNOSIS — R599 Enlarged lymph nodes, unspecified: Secondary | ICD-10-CM

## 2013-10-25 DIAGNOSIS — R109 Unspecified abdominal pain: Secondary | ICD-10-CM

## 2013-10-25 DIAGNOSIS — G8929 Other chronic pain: Secondary | ICD-10-CM

## 2013-10-25 HISTORY — DX: Unspecified abdominal hernia without obstruction or gangrene: K46.9

## 2013-10-25 NOTE — Progress Notes (Signed)
[facility] OFFICE PROGRESS NOTE  No PCP Per Patient DIAGNOSIS:  Abnormal lymphadenopathy, microcytic anemia, for further management  SUMMARY OF HEMATOLOGIC HISTORY: This is a pleasant 48 year old lady with background history I'm deficiency anemia, likely secondary to chronic GI bleed from hemorrhoids. Colonoscopy from 2012 confirmed the diagnosis of internal hemorrhoids. The patient was also found to have lymphadenopathy in the mediastinum and abdomen and she was told she may have diagnosis of sarcoidosis. INTERVAL HISTORY: Cassandra Burns 48 y.o. female returns for further followup. She denies any new lymphadenopathy. The patient continued to have chronic, ongoing hemorrhoidal bleeding on a daily basis. She complained of intermittent pain around her periumbilical region. No change in bowel habits. Denies any recent weight loss.  I have reviewed the past medical history, past surgical history, social history and family history with the patient and they are unchanged from previous note.  ALLERGIES:  has No Known Allergies.  MEDICATIONS:  Current Outpatient Prescriptions  Medication Sig Dispense Refill  . amLODipine (NORVASC) 5 MG tablet Take 5 mg by mouth 2 (two) times daily.        [name] FLUoxetine (PROZAC) 40 MG capsule Take 1 capsule a day  30 capsule  1  . hydrOXYzine (VISTARIL) 100 MG capsule Take 1 capsule (100 mg total) by mouth at bedtime.  30 capsule  0  . loratadine (CLARITIN) 10 MG tablet Take 10 mg by mouth daily.        [name] losartan-hydrochlorothiazide (HYZAAR) 100-12.5 MG per tablet Take 1 tablet by mouth daily.      . metFORMIN (GLUCOPHAGE) 1000 MG tablet Take 1,000 mg by mouth 2 (two) times daily with a meal.       No current facility-administered medications for this visit.     REVIEW OF SYSTEMS:   Constitutional: Denies fevers, chills or night sweats Behavioral/Psych: Mood is stable, no new changes  All other systems were reviewed with the patient  and are negative.  PHYSICAL EXAMINATION: ECOG PERFORMANCE STATUS: 0 - Asymptomatic  Filed Vitals:   10/25/13 0830  BP: 151/85  Pulse: 116  Temp: 97.9 F (36.6 C)  Resp: 20   Filed Weights   10/25/13 0830  Weight: 231 lb 14.4 oz (105.189 kg)    GENERAL:alert, no distress and comfortable. She is morbidly obese Musculoskeletal:no cyanosis of digits and no clubbing  NEURO: alert & oriented x 3 with fluent speech, no focal motor/sensory deficits  LABORATORY DATA:  I have reviewed the data as listed No results found for this or any previous visit (from the past 48 hour(s)).  Lab Results  Component Value Date   WBC 3.9 06/07/2013   HGB 11.0* 06/07/2013   HCT 35.7 06/07/2013   MCV 70.8* 06/07/2013   PLT 215 06/07/2013    RADIOGRAPHIC STUDIES: I reviewed her PET CT scan with the patient I have personally reviewed the radiological images as listed and agreed with the findings in the report. 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 I will order flow cytometry to see if we would pick up an abnormal clone. I want to present her case at the next hematology tumor board next week and will call the patient with recommendation. #4 chronic hemorrhoidal bleeding #5 severe ventral hernia #6 chronic abdominal pain I recommend referral to Gen. Surgery to see if it is possible for surgical repair of her hernia. The patient needs to lose weight. The  patient is wondering about bariatric surgery. She also had chronic hemorrhoidal bleeding and she may be a candidate for some sort procedure to stop her hemorrhoid bleeding. All questions were answered. The patient knows to call the clinic with any problems, questions or concerns. No barriers to learning was detected.  I spent 25 minutes counseling the patient face to face. The total time spent in the appointment was 30 minutes and  more than 50% was on counseling.     [place], [place], MD 10/25/2013 9:33 AM

## 2013-10-25 NOTE — Telephone Encounter (Signed)
gv adn printed appt sched and avs for pt for April.Cassandra KitchenMarland KitchenPt sched to see Dr. Leane Para on April 9th @ 10am....sent pt to lab

## 2013-10-26 LAB — FLOW CYTOMETRY

## 2013-11-04 ENCOUNTER — Other Ambulatory Visit: Payer: Self-pay | Admitting: Hematology and Oncology

## 2013-11-04 ENCOUNTER — Ambulatory Visit (INDEPENDENT_AMBULATORY_CARE_PROVIDER_SITE_OTHER): Payer: Medicare Other | Admitting: General Surgery

## 2013-11-04 ENCOUNTER — Telehealth: Payer: Self-pay | Admitting: Hematology and Oncology

## 2013-11-04 ENCOUNTER — Encounter (INDEPENDENT_AMBULATORY_CARE_PROVIDER_SITE_OTHER): Payer: Self-pay | Admitting: General Surgery

## 2013-11-04 VITALS — BP 133/78 | HR 82 | Temp 97.7°F | Resp 16 | Ht 59.0 in | Wt 229.6 lb

## 2013-11-04 DIAGNOSIS — M6200 Separation of muscle (nontraumatic), unspecified site: Secondary | ICD-10-CM

## 2013-11-04 DIAGNOSIS — R591 Generalized enlarged lymph nodes: Secondary | ICD-10-CM

## 2013-11-04 DIAGNOSIS — M6208 Separation of muscle (nontraumatic), other site: Secondary | ICD-10-CM | POA: Insufficient documentation

## 2013-11-04 NOTE — Progress Notes (Signed)
Patient ID: Cassandra Burns, female   DOB: 14-Feb-1966, 48 y.o.   MRN: [id number]  Chief Complaint  Patient presents with  . Hernia    HPI Cassandra Burns is a 48 y.o. female.  She is referred by Dr. Simeon Craft such for a bulge in her upper abdomen which might be a hernia. At the end of the office visit she also stated she was having painless rectal bleeding.  The patient has numerous medical problems, including morbid obesity, diabetes, hypertension, chronic anemia. She also has chronic depression and is followed regularly by the psychiatrist at behavioral health. She's been evaluated by Danton Sewer for sarcoidosis but apparently, according to the patient, sarcoidosis has been ruled out. She states that she has had a colonoscopy by [facility] in the past couple of years.  Because of the thoracic and abdominal lymphadenopathy and anemia, she is being worked up for possible lymphoplasmacytic lymphoma. She's had lots of tests, PET scans. Bone marrow is planned. Apparently there are no peripheral lymph nodes to biopsy. Recent evaluation Dr. Alvy Bimler in the office revealed a bulge in the upper abdomen.  This does not cause pain.   The patient notices it when she sits up. The only abdominal operation she's had is a tubal ligation.   Her rectal bleeding is painless. It is intermittent but can be significant. No clots. She was told she has small hemorrhoids when she had a colonoscopy. HPI  Past Medical History  Diagnosis Date  . Hypertension   . Asthma   . Iron deficiency anemia due to chronic blood loss   . Menometrorrhagia   . Psoriasis   . Sleep apnea   . 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 Obesity 12/18/2012  . Depression   . Anxiety   . Bone pain 06/07/2013  . Lymphadenopathy 06/07/2013  . Sarcoidosis 07/26/2013  . Unspecified deficiency anemia 07/26/2013  . Hernia of abdominal cavity 10/25/2013    Past Surgical History  Procedure Laterality Date  . Tubal ligation       Family History  Problem Relation Age of Onset  . Diabetes type II Father   . Asthma Father   . Hypertension Father   . Heart murmur Father   . Alcohol abuse Father   . Drug abuse Father   . Hypertension Mother   . Alcohol abuse Mother   . Drug abuse Mother     Social History History  Substance Use Topics  . Smoking status: Never Smoker   . Smokeless tobacco: Never Used     Comment: Smokes Marijuana  . Alcohol Use: No    No Known Allergies  Current Outpatient Prescriptions  Medication Sig Dispense Refill  . amLODipine (NORVASC) 5 MG tablet Take 5 mg by mouth 2 (two) times daily.        [name] FLUoxetine (PROZAC) 40 MG capsule Take 1 capsule a day  30 capsule  1  . hydrOXYzine (VISTARIL) 100 MG capsule Take 1 capsule (100 mg total) by mouth at bedtime.  30 capsule  0  . loratadine (CLARITIN) 10 MG tablet Take 10 mg by mouth daily.        [name] losartan-hydrochlorothiazide (HYZAAR) 100-12.5 MG per tablet Take 1 tablet by mouth daily.      . metFORMIN (GLUCOPHAGE) 1000 MG tablet Take 1,000 mg by mouth 2 (two) times daily with a meal.       No current facility-administered medications for this visit.    Review of Systems Review  of Systems  Constitutional: Negative for fever, chills and unexpected weight change.  HENT: Negative for congestion, hearing loss, sore throat, trouble swallowing and voice change.   Eyes: Negative for visual disturbance.  Respiratory: Positive for shortness of breath. Negative for cough and wheezing.   Cardiovascular: Negative for chest pain, palpitations and leg swelling.  Gastrointestinal: Positive for abdominal distention and anal bleeding. Negative for nausea, vomiting, abdominal pain, diarrhea, constipation, blood in stool and rectal pain.  Genitourinary: Negative for hematuria, vaginal bleeding and difficulty urinating.  Musculoskeletal: Negative for arthralgias.  Skin: Negative for rash and wound.  Neurological: Negative for seizures, syncope and  headaches.  Hematological: Positive for adenopathy. Does not bruise/bleed easily.  Psychiatric/Behavioral: Positive for behavioral problems. Negative for confusion. The patient is nervous/anxious.     Blood pressure 133/78, pulse 82, temperature 97.7 F (36.5 C), temperature source Temporal, resp. rate 16, height 4\' 11"  (1.499 m), weight 229 lb 9.6 oz (104.146 kg).  Physical Exam Physical Exam  Constitutional: She is oriented to person, place, and time. She appears well-developed and well-nourished. No distress.  Pleasant. Morbid obesity. Cooperative.  HENT:  Head: Normocephalic and atraumatic.  Nose: Nose normal.  Mouth/Throat: No oropharyngeal exudate.  Eyes: Conjunctivae and EOM are normal. Pupils are equal, round, and reactive to light. Left eye exhibits no discharge. No scleral icterus.  Neck: Neck supple. No JVD present. No tracheal deviation present. No thyromegaly present.  Cardiovascular: Normal rate, regular rhythm, normal heart sounds and intact distal pulses.   No murmur heard. Pulmonary/Chest: Effort normal and breath sounds normal. No respiratory distress. She has no wheezes. She has no rales. She exhibits no tenderness.  Abdominal: Soft. Bowel sounds are normal. She exhibits no distension and no mass. There is no tenderness. There is no rebound and no guarding.  Very round and protuberant. No scars noted. No hernias when standing. Clear-cut diastases recti when supine doing setup. Nontender.  Musculoskeletal: She exhibits no edema and no tenderness.  Lymphadenopathy:    She has no cervical adenopathy.  Neurological: She is alert and oriented to person, place, and time. She exhibits normal muscle tone. Coordination normal.  Skin: Skin is warm. No rash noted. She is not diaphoretic. No erythema. No pallor.  Psychiatric: She has a normal mood and affect. Her behavior is normal. Judgment and thought content normal.    Data Reviewed Office notes from cancer center. CT scan.  Other imaging studies.  Assessment    Diastases recti. This is a nonsurgical anatomic variant of the abdominal muscles, which was explained to the patient.  Painless rectal bleeding. Suspect internal hemorrhoids.  Diabetes mellitus, non-insulin-dependent  Hypertension  Morbid obesity  Depression, measured, recurrent  Obstructive sleep apnea  Mediastinal and abdominal lymphadenopathy, lymphoma workup underway     Plan    I explained that she did not have a hernia and did not need an abdominal operation.  There was some interest in bariatrics for weight loss, and I told her that when she is ready we will refer  her to a patient education session  I told her that the most important thing  medically at this point in time was a lymphoma workup  She was interested in referral to colorectal surgery, and so we will set up an appointment for her to see Dr. Leighton Ruff  Return to see me as needed.        Adin Hector 11/04/2013, 11:20 AM

## 2013-11-04 NOTE — Patient Instructions (Signed)
The bulge that you see in your upper abdomen when you do is set up as called diastases recti. It is not a hernia. All of the muscles and connective tissues are intact.This does not require surgery. It is not dangerous, just a variant in the way the muscles were put together.  In terms of your painless rectal bleeding, we will refer you to Dr. Marcello Moores, our  colorectal specialist.

## 2013-11-04 NOTE — Telephone Encounter (Signed)
, °

## 2013-11-04 NOTE — Telephone Encounter (Signed)
I discussed with the patient over the telephone about recommendation from most recent case discussion at the hematology tumor board. Overall consensus is for the patient to proceed with bone marrow biopsy. I discussed with the patient the benefits of this and she agreed to proceed.  Due to morbid obesity, I would have a bone marrow biopsy performed by CT radiologist.

## 2013-11-11 ENCOUNTER — Other Ambulatory Visit: Payer: Self-pay | Admitting: Radiology

## 2013-11-11 ENCOUNTER — Encounter (HOSPITAL_COMMUNITY): Payer: Self-pay | Admitting: Pharmacy Technician

## 2013-11-15 ENCOUNTER — Encounter (HOSPITAL_COMMUNITY): Payer: Self-pay

## 2013-11-15 ENCOUNTER — Ambulatory Visit (HOSPITAL_COMMUNITY)
Admission: RE | Admit: 2013-11-15 | Discharge: 2013-11-15 | Disposition: A | Discharge status: Home / Self Care | Payer: Medicare Other | Source: Ambulatory Visit | Attending: Hematology and Oncology | Admitting: Hematology and Oncology

## 2013-11-15 DIAGNOSIS — F411 Generalized anxiety disorder: Secondary | ICD-10-CM | POA: Insufficient documentation

## 2013-11-15 DIAGNOSIS — F329 Major depressive disorder, single episode, unspecified: Secondary | ICD-10-CM | POA: Insufficient documentation

## 2013-11-15 DIAGNOSIS — D509 Iron deficiency anemia, unspecified: Secondary | ICD-10-CM | POA: Insufficient documentation

## 2013-11-15 DIAGNOSIS — C8589 Other specified types of non-Hodgkin lymphoma, extranodal and solid organ sites: Secondary | ICD-10-CM | POA: Insufficient documentation

## 2013-11-15 DIAGNOSIS — I10 Essential (primary) hypertension: Secondary | ICD-10-CM | POA: Insufficient documentation

## 2013-11-15 DIAGNOSIS — F1210 Cannabis abuse, uncomplicated: Secondary | ICD-10-CM | POA: Insufficient documentation

## 2013-11-15 DIAGNOSIS — Z79899 Other long term (current) drug therapy: Secondary | ICD-10-CM | POA: Insufficient documentation

## 2013-11-15 DIAGNOSIS — F3289 Other specified depressive episodes: Secondary | ICD-10-CM | POA: Insufficient documentation

## 2013-11-15 DIAGNOSIS — F1410 Cocaine abuse, uncomplicated: Secondary | ICD-10-CM | POA: Insufficient documentation

## 2013-11-15 DIAGNOSIS — R591 Generalized enlarged lymph nodes: Secondary | ICD-10-CM

## 2013-11-15 DIAGNOSIS — E119 Type 2 diabetes mellitus without complications: Secondary | ICD-10-CM | POA: Insufficient documentation

## 2013-11-15 DIAGNOSIS — R599 Enlarged lymph nodes, unspecified: Secondary | ICD-10-CM | POA: Insufficient documentation

## 2013-11-15 DIAGNOSIS — D704 Cyclic neutropenia: Secondary | ICD-10-CM | POA: Insufficient documentation

## 2013-11-15 DIAGNOSIS — G4730 Sleep apnea, unspecified: Secondary | ICD-10-CM | POA: Insufficient documentation

## 2013-11-15 DIAGNOSIS — J45909 Unspecified asthma, uncomplicated: Secondary | ICD-10-CM | POA: Insufficient documentation

## 2013-11-15 LAB — CBC
HEMATOCRIT: 33.4 % — AB (ref 36.0–46.0)
Hemoglobin: 10.4 g/dL — ABNORMAL LOW (ref 12.0–15.0)
MCH: 20.6 pg — AB (ref 26.0–34.0)
MCHC: 31.1 g/dL (ref 30.0–36.0)
MCV: 66 fL — ABNORMAL LOW (ref 78.0–100.0)
Platelets: 208 10*3/uL (ref 150–400)
RBC: 5.06 MIL/uL (ref 3.87–5.11)
RDW: 18.2 % — ABNORMAL HIGH (ref 11.5–15.5)
WBC: 3.4 10*3/uL — ABNORMAL LOW (ref 4.0–10.5)

## 2013-11-15 LAB — PROTIME-INR
INR: 0.94 (ref 0.00–1.49)
Prothrombin Time: 12.4 seconds (ref 11.6–15.2)

## 2013-11-15 LAB — BONE MARROW EXAM

## 2013-11-15 LAB — APTT: aPTT: 31 seconds (ref 24–37)

## 2013-11-15 LAB — GLUCOSE, CAPILLARY: Glucose-Capillary: 290 mg/dL — ABNORMAL HIGH (ref 70–99)

## 2013-11-15 MED ORDER — MIDAZOLAM HCL 2 MG/2ML IJ SOLN
INTRAMUSCULAR | Status: AC | PRN
  Administered 2013-11-15: 0.5 mg via INTRAVENOUS
  Administered 2013-11-15: 1 mg via INTRAVENOUS
  Administered 2013-11-15 (×2): 0.5 mg via INTRAVENOUS

## 2013-11-15 MED ORDER — SODIUM CHLORIDE 0.9 % IV SOLN
INTRAVENOUS | Status: DC
Start: 2013-11-15 — End: 2013-11-16
  Administered 2013-11-15: 08:00:00 via INTRAVENOUS

## 2013-11-15 MED ORDER — FENTANYL CITRATE 0.05 MG/ML IJ SOLN
INTRAMUSCULAR | Status: AC | PRN
  Administered 2013-11-15: 25 ug via INTRAVENOUS
  Administered 2013-11-15: 50 ug via INTRAVENOUS

## 2013-11-15 MED ORDER — MIDAZOLAM HCL 2 MG/2ML IJ SOLN
INTRAMUSCULAR | Status: AC
  Filled 2013-11-15: qty 6

## 2013-11-15 MED ORDER — FENTANYL CITRATE 0.05 MG/ML IJ SOLN
INTRAMUSCULAR | Status: AC
  Filled 2013-11-15: qty 6

## 2013-11-15 NOTE — Discharge Instructions (Signed)
Moderate Sedation, Adult °Moderate sedation is given to help you relax or even sleep through a procedure. You may remain sleepy, be clumsy, or have poor balance for several hours following this procedure. Arrange for a responsible adult, family member, or friend to take you home. A responsible adult should stay with you for at least 24 hours or until the medicines have worn off. °· Do not participate in any activities where you could become injured for the next 24 hours, or until you feel normal again. Do not: °· Drive. °· Swim. °· Ride a bicycle. °· Operate heavy machinery. °· Cook. °· Use power tools. °· Climb ladders. °· Work at heights. °· Do not make important decisions or sign legal documents until you are improved. °· Vomiting may occur if you eat too soon. When you can drink without vomiting, try water, juice, or soup. Try solid foods if you feel little or no nausea. °· Only take over-the-counter or prescription medications for pain, discomfort, or fever as directed by your caregiver.If pain medications have been prescribed for you, ask your caregiver how soon it is safe to take them. °· Make sure you and your family fully understands everything about the medication given to you. Make sure you understand what side effects may occur. °· You should not drink alcohol, take sleeping pills, or medications that cause drowsiness for at least 24 hours. °· If you smoke, do not smoke alone. °· If you are feeling better, you may resume normal activities 24 hours after receiving sedation. °· Keep all appointments as scheduled. Follow all instructions. °· Ask questions if you do not understand. °SEEK MEDICAL CARE IF:  °· Your skin is pale or bluish in color. °· You continue to feel sick to your stomach (nauseous) or throw up (vomit). °· Your pain is getting worse and not helped by medication. °· You have bleeding or swelling. °· You are still sleepy or feeling clumsy after 24 hours. °SEEK IMMEDIATE MEDICAL CARE IF:   °· You develop a rash. °· You have difficulty breathing. °· You develop any type of allergic problem. °· You have a fever. °Document Released: 04/09/2001 Document Revised: 10/07/2011 Document Reviewed: 03/22/2013 °ExitCare® Patient Information ©2014 [employer]. °Bone Marrow Aspiration, Bone Marrow Biopsy °Care After °Read the instructions outlined below and refer to this sheet in the next few weeks. These discharge instructions provide you with general information on caring for yourself after you leave the hospital. Your caregiver may also give you specific instructions. While your treatment has been planned according to the most current medical practices available, unavoidable complications occasionally occur. If you have any problems or questions after discharge, call your caregiver. °FINDING OUT THE RESULTS OF YOUR TEST °Not all test results are available during your visit. If your test results are not back during the visit, make an appointment with your caregiver to find out the results. Do not assume everything is normal if you have not heard from your caregiver or the medical facility. It is important for you to follow up on all of your test results.  °HOME CARE INSTRUCTIONS  °You have had sedation and may be sleepy or dizzy. Your thinking may not be as clear as usual. For the next 24 hours: °· Only take over-the-counter or prescription medicines for pain, discomfort, and or fever as directed by your caregiver. °· Do not drink alcohol. °· Do not smoke. °· Do not drive. °· Do not make important legal decisions. °· Do not operate heavy machinery. °·   Do not care for small children by yourself. °· Keep your dressing clean and dry. You may replace dressing with a bandage after 24 hours. °· You may take a bath or shower after 24 hours. °· Use an ice pack for 20 minutes every 2 hours while awake for pain as needed. °SEEK MEDICAL CARE IF:  °· There is redness, swelling, or increasing pain at the biopsy  site. °· There is pus coming from the biopsy site. °· There is drainage from a biopsy site lasting longer than one day. °· An unexplained oral temperature above 102° F (38.9° C) develops. °SEEK IMMEDIATE MEDICAL CARE IF:  °· You develop a rash. °· You have difficulty breathing. °· You develop any reaction or side effects to medications given. °Document Released: 02/01/2005 Document Revised: 10/07/2011 Document Reviewed: 07/12/2008 °ExitCare® Patient Information ©2014 [employer]. ° °

## 2013-11-15 NOTE — Procedures (Signed)
Successful RT ILIAC BM ASP AND CORE BX NO COMP STABLE PATH PENDING FULL REPORT IN PACS

## 2013-11-15 NOTE — H&P (Signed)
Chief Complaint: "I'm here for a bone marrow biopsy" Referring Physician:Gorsuch HPI: Cassandra Burns is an 48 y.o. female with lab workup suspicious for lymphoma. She is scheduled for bone marrow biopsy. PMHx and meds reviewed.   Past Medical History:  Past Medical History  Diagnosis Date  . Hypertension   . Asthma   . Iron deficiency anemia due to chronic blood loss   . Menometrorrhagia   . Psoriasis   . Sleep apnea   . 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 Obesity 12/18/2012  . Depression   . Anxiety   . Bone pain 06/07/2013  . Lymphadenopathy 06/07/2013  . Sarcoidosis 07/26/2013  . Unspecified deficiency anemia 07/26/2013  . Hernia of abdominal cavity 10/25/2013    Past Surgical History:  Past Surgical History  Procedure Laterality Date  . Tubal ligation      Family History:  Family History  Problem Relation Age of Onset  . Diabetes type II Father   . Asthma Father   . Hypertension Father   . Heart murmur Father   . Alcohol abuse Father   . Drug abuse Father   . Hypertension Mother   . Alcohol abuse Mother   . Drug abuse Mother     Social History:  reports that she has never smoked. She has never used smokeless tobacco. She reports that she uses illicit drugs (Marijuana and "Crack" cocaine) about 7 times per week. She reports that she does not drink alcohol.  Allergies: No Known Allergies  Medications:   Medication List    ASK your doctor about these medications       amLODipine 5 MG tablet  Commonly known as:  NORVASC  Take 5 mg by mouth 2 (two) times daily.     FLUoxetine 40 MG capsule  Commonly known as:  PROZAC  Take 40 mg by mouth every morning.     loratadine 10 MG tablet  Commonly known as:  CLARITIN  Take 10 mg by mouth daily.     losartan-hydrochlorothiazide 100-12.5 MG per tablet  Commonly known as:  HYZAAR  Take 1 tablet by mouth every morning.     metFORMIN 1000 MG tablet  Commonly known as:  GLUCOPHAGE  Take 1,000 mg  by mouth 2 (two) times daily with a meal.        Please HPI for pertinent positives, otherwise complete 10 system ROS negative.  Physical Exam: BP 162/86  Pulse 97  Temp(Src) 98 F (36.7 C) (Oral)  Resp 16  Ht 4' 11"  (1.499 m)  SpO2 94% There is no weight on file to calculate BMI.   General Appearance:  Alert, cooperative, no distress, appears stated age  Head:  Normocephalic, without obvious abnormality, atraumatic  ENT: Unremarkable  Neck: Supple, symmetrical, trachea midline  Lungs:   Clear to auscultation bilaterally, no w/r/r, respirations unlabored without use of accessory muscles.  Chest Wall:  No tenderness or deformity  Heart:  Regular rate and rhythm, S1, S2 normal, no murmur, rub or gallop.  Neurologic: Normal affect, no gross deficits.   Results for orders placed during the hospital encounter of 11/15/13 (from the past 48 hour(s))  APTT     Status: None   Collection Time    11/15/13  7:30 AM      Result Value Ref Range   aPTT 31  24 - 37 seconds  CBC     Status: Abnormal   Collection Time    11/15/13  7:30 AM  Result Value Ref Range   WBC 3.4 (*) 4.0 - 10.5 K/uL   RBC 5.06  3.87 - 5.11 MIL/uL   Hemoglobin 10.4 (*) 12.0 - 15.0 g/dL   HCT 33.4 (*) 36.0 - 46.0 %   MCV 66.0 (*) 78.0 - 100.0 fL   MCH 20.6 (*) 26.0 - 34.0 pg   MCHC 31.1  30.0 - 36.0 g/dL   RDW 18.2 (*) 11.5 - 15.5 %   Platelets 208  150 - 400 K/uL  PROTIME-INR     Status: None   Collection Time    11/15/13  7:30 AM      Result Value Ref Range   Prothrombin Time 12.4  11.6 - 15.2 seconds   INR 0.94  0.00 - 1.49   No results found.  Assessment/Plan Abnormal lab studies suspicious for lymphoma Plan for CT guided bone marrow biopsy today. Explained procedure, risks, complications, use of sedation. Labs reviewed. Consent signed in chart  Ascencion Dike PA-C 11/15/2013, 8:19 AM

## 2013-11-22 ENCOUNTER — Telehealth: Payer: Self-pay | Admitting: Hematology and Oncology

## 2013-11-22 ENCOUNTER — Ambulatory Visit (HOSPITAL_BASED_OUTPATIENT_CLINIC_OR_DEPARTMENT_OTHER): Payer: Medicare Other | Admitting: Hematology and Oncology

## 2013-11-22 ENCOUNTER — Encounter: Payer: Self-pay | Admitting: Hematology and Oncology

## 2013-11-22 VITALS — BP 155/90 | HR 105 | Temp 97.1°F | Resp 18 | Ht 59.0 in | Wt 227.7 lb

## 2013-11-22 DIAGNOSIS — K648 Other hemorrhoids: Secondary | ICD-10-CM

## 2013-11-22 DIAGNOSIS — R599 Enlarged lymph nodes, unspecified: Secondary | ICD-10-CM

## 2013-11-22 DIAGNOSIS — D509 Iron deficiency anemia, unspecified: Secondary | ICD-10-CM

## 2013-11-22 DIAGNOSIS — R109 Unspecified abdominal pain: Secondary | ICD-10-CM

## 2013-11-22 DIAGNOSIS — G8929 Other chronic pain: Secondary | ICD-10-CM

## 2013-11-22 DIAGNOSIS — K439 Ventral hernia without obstruction or gangrene: Secondary | ICD-10-CM

## 2013-11-22 DIAGNOSIS — N921 Excessive and frequent menstruation with irregular cycle: Secondary | ICD-10-CM

## 2013-11-22 LAB — CHROMOSOME ANALYSIS, BONE MARROW

## 2013-11-22 NOTE — Progress Notes (Signed)
[facility] OFFICE PROGRESS NOTE  AVBUERE,[name], MD DIAGNOSIS:  Abnormal lymphadenopathy, microcytic anemia, for further management  SUMMARY OF HEMATOLOGIC HISTORY: This is Burns pleasant 48 year old lady with background history iron deficiency anemia, likely secondary to chronic GI bleed from hemorrhoids. Colonoscopy from 2012 confirmed the diagnosis of internal hemorrhoids. The patient was also found to have lymphadenopathy in the mediastinum and abdomen and she was told she may have diagnosis of sarcoidosis. Imaging study confirmed abnormalities, worrisome for lymphoma. Unfortunately, bone marrow aspirate and biopsy was nondiagnostic. It did come from iron deficiency anemia. INTERVAL HISTORY: Cassandra Burns 48 y.o. female returns for further followup. She has intermittent abdominal pain and occasional nausea. She has ongoing hemorrhoidal bleeding on Burns daily basis.  I have reviewed the past medical history, past surgical history, social history and family history with the patient and they are unchanged from previous note.  ALLERGIES:  has No Known Allergies.  MEDICATIONS:  Current Outpatient Prescriptions  Medication Sig Dispense Refill  . amLODipine (NORVASC) 5 MG tablet Take 5 mg by mouth 2 (two) times daily.        [name] FLUoxetine (PROZAC) 40 MG capsule Take 40 mg by mouth every morning.      . loratadine (CLARITIN) 10 MG tablet Take 10 mg by mouth daily.        [name] losartan-hydrochlorothiazide (HYZAAR) 100-12.5 MG per tablet Take 1 tablet by mouth every morning.       . metFORMIN (GLUCOPHAGE) 1000 MG tablet Take 1,000 mg by mouth 2 (two) times daily with Burns meal.       No current facility-administered medications for this visit.     REVIEW OF SYSTEMS:   Constitutional: Denies fevers, chills or night sweats Behavioral/Psych: Mood is stable, no new changes  All other systems were reviewed with the patient and are negative.  PHYSICAL EXAMINATION: ECOG PERFORMANCE  STATUS: 1 - Symptomatic but completely ambulatory  Filed Vitals:   11/22/13 1204  BP: 155/90  Pulse: 105  Temp: 97.1 F (36.2 C)  Resp: 18   Filed Weights   11/22/13 1204  Weight: 227 lb 11.2 oz (103.284 kg)    GENERAL:alert, no distress and comfortable. She is morbidly obese SKIN: skin color, texture, turgor are normal, no rashes or significant lesions EYES: normal, Conjunctiva are pale and non-injected, sclera clear Musculoskeletal:no cyanosis of digits and no clubbing  NEURO: alert & oriented x 3 with fluent speech, no focal motor/sensory deficits  LABORATORY DATA:  I have reviewed the data as listed No results found for this or any previous visit (from the past 48 hour(s)).  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 The patient will need lymph node biopsy. I will refer her to Burns pulmonologist to see if they could get into the lymph node to get tissue diagnosis. #4 chronic hemorrhoidal bleeding #5 severe ventral hernia #6 chronic abdominal pain  I recommend referral to Gen. Surgery for treatment of hemorrhoid surgery.  In the meantime, I will get her started on oral iron supplement to treat her iron deficiency anemia. All questions were answered. The patient knows to call the clinic with any problems, questions or  concerns. No barriers to learning was detected. I spent 25 minutes counseling the patient face to face. The total time spent in the appointment was 30 minutes and more than 50% was on counseling.     Heath Lark, MD 11/22/2013 12:29 PM

## 2013-11-22 NOTE — Telephone Encounter (Signed)
gv adn printed appt sched and avs for pt for May °

## 2013-11-23 ENCOUNTER — Telehealth: Payer: Self-pay | Admitting: Hematology and Oncology

## 2013-11-23 ENCOUNTER — Ambulatory Visit (INDEPENDENT_AMBULATORY_CARE_PROVIDER_SITE_OTHER): Payer: Medicare Other | Admitting: General Surgery

## 2013-11-23 NOTE — Telephone Encounter (Signed)
Pt appt. With Dr. Lucia Gaskins @ [facility] Pulmonology Dept. Is 01/10/14@4 :00. Trevor-new pt.scheduler will email the nurse to try to get pt in sooner, hopefully the middle of may. The  Pulmonology Dept. Number [phone]. Pt is aware. Medical records faxed.

## 2013-11-24 ENCOUNTER — Telehealth: Payer: Self-pay | Admitting: *Deleted

## 2013-11-24 NOTE — Telephone Encounter (Signed)
Opened chart in error.

## 2013-11-26 ENCOUNTER — Emergency Department (HOSPITAL_COMMUNITY)
Admission: EM | Admit: 2013-11-26 | Discharge: 2013-11-26 | Disposition: A | Discharge status: Home / Self Care | Payer: Medicare Other | Attending: Emergency Medicine | Admitting: Emergency Medicine

## 2013-11-26 ENCOUNTER — Telehealth: Payer: Self-pay | Admitting: Pulmonary Disease

## 2013-11-26 ENCOUNTER — Encounter (HOSPITAL_COMMUNITY): Payer: Self-pay | Admitting: Emergency Medicine

## 2013-11-26 DIAGNOSIS — E1165 Type 2 diabetes mellitus with hyperglycemia: Secondary | ICD-10-CM

## 2013-11-26 DIAGNOSIS — I10 Essential (primary) hypertension: Secondary | ICD-10-CM | POA: Insufficient documentation

## 2013-11-26 DIAGNOSIS — Z8739 Personal history of other diseases of the musculoskeletal system and connective tissue: Secondary | ICD-10-CM | POA: Insufficient documentation

## 2013-11-26 DIAGNOSIS — R5383 Other fatigue: Secondary | ICD-10-CM

## 2013-11-26 DIAGNOSIS — Z872 Personal history of diseases of the skin and subcutaneous tissue: Secondary | ICD-10-CM | POA: Insufficient documentation

## 2013-11-26 DIAGNOSIS — R5381 Other malaise: Secondary | ICD-10-CM | POA: Insufficient documentation

## 2013-11-26 DIAGNOSIS — R3589 Other polyuria: Secondary | ICD-10-CM | POA: Insufficient documentation

## 2013-11-26 DIAGNOSIS — E119 Type 2 diabetes mellitus without complications: Secondary | ICD-10-CM | POA: Insufficient documentation

## 2013-11-26 DIAGNOSIS — K117 Disturbances of salivary secretion: Secondary | ICD-10-CM | POA: Insufficient documentation

## 2013-11-26 DIAGNOSIS — F329 Major depressive disorder, single episode, unspecified: Secondary | ICD-10-CM | POA: Insufficient documentation

## 2013-11-26 DIAGNOSIS — F3289 Other specified depressive episodes: Secondary | ICD-10-CM | POA: Insufficient documentation

## 2013-11-26 DIAGNOSIS — Z8742 Personal history of other diseases of the female genital tract: Secondary | ICD-10-CM | POA: Insufficient documentation

## 2013-11-26 DIAGNOSIS — J45909 Unspecified asthma, uncomplicated: Secondary | ICD-10-CM | POA: Insufficient documentation

## 2013-11-26 DIAGNOSIS — R358 Other polyuria: Secondary | ICD-10-CM | POA: Insufficient documentation

## 2013-11-26 DIAGNOSIS — Z79899 Other long term (current) drug therapy: Secondary | ICD-10-CM | POA: Insufficient documentation

## 2013-11-26 DIAGNOSIS — F411 Generalized anxiety disorder: Secondary | ICD-10-CM | POA: Insufficient documentation

## 2013-11-26 DIAGNOSIS — R631 Polydipsia: Secondary | ICD-10-CM | POA: Insufficient documentation

## 2013-11-26 DIAGNOSIS — R51 Headache: Secondary | ICD-10-CM | POA: Insufficient documentation

## 2013-11-26 DIAGNOSIS — R632 Polyphagia: Secondary | ICD-10-CM | POA: Insufficient documentation

## 2013-11-26 DIAGNOSIS — R110 Nausea: Secondary | ICD-10-CM | POA: Insufficient documentation

## 2013-11-26 DIAGNOSIS — Z862 Personal history of diseases of the blood and blood-forming organs and certain disorders involving the immune mechanism: Secondary | ICD-10-CM | POA: Insufficient documentation

## 2013-11-26 LAB — COMPREHENSIVE METABOLIC PANEL WITH GFR
AST: 23 U/L (ref 0–37)
Albumin: 3.7 g/dL (ref 3.5–5.2)
Creatinine, Ser: 0.79 mg/dL (ref 0.50–1.10)

## 2013-11-26 LAB — I-STAT CHEM 8, ED
BUN: 16 mg/dL (ref 6–23)
Calcium, Ion: 1.21 mmol/L (ref 1.12–1.23)
Chloride: 102 mEq/L (ref 96–112)
Creatinine, Ser: 0.8 mg/dL (ref 0.50–1.10)
Glucose, Bld: 320 mg/dL — ABNORMAL HIGH (ref 70–99)
HCT: 38 % (ref 36.0–46.0)
Hemoglobin: 12.9 g/dL (ref 12.0–15.0)
Potassium: 4.5 mEq/L (ref 3.7–5.3)
Sodium: 137 mEq/L (ref 137–147)
TCO2: 24 mmol/L (ref 0–100)

## 2013-11-26 LAB — URINALYSIS, ROUTINE W REFLEX MICROSCOPIC
Bilirubin Urine: NEGATIVE
Glucose, UA: >1000 mg/dL — AB
Hgb urine dipstick: NEGATIVE
Ketones, ur: 15 mg/dL — AB
Leukocytes, UA: NEGATIVE
Nitrite: NEGATIVE
Protein, ur: NEGATIVE mg/dL
Specific Gravity, Urine: 1.027 (ref 1.005–1.030)
Urobilinogen, UA: 0.2 mg/dL (ref 0.0–1.0)
pH: 6 (ref 5.0–8.0)

## 2013-11-26 LAB — URINE MICROSCOPIC-ADD ON

## 2013-11-26 LAB — CBC
HCT: 36 % (ref 36.0–46.0)
Hemoglobin: 11.3 g/dL — ABNORMAL LOW (ref 12.0–15.0)
MCH: 20.9 pg — ABNORMAL LOW (ref 26.0–34.0)
MCHC: 31.4 g/dL (ref 30.0–36.0)
MCV: 66.7 fL — ABNORMAL LOW (ref 78.0–100.0)
Platelets: 226 10*3/uL (ref 150–400)
RBC: 5.4 MIL/uL — ABNORMAL HIGH (ref 3.87–5.11)
RDW: 18.6 % — ABNORMAL HIGH (ref 11.5–15.5)
WBC: 4.6 10*3/uL (ref 4.0–10.5)

## 2013-11-26 LAB — COMPREHENSIVE METABOLIC PANEL
ALT: 28 U/L (ref 0–35)
Alkaline Phosphatase: 104 U/L (ref 39–117)
BUN: 19 mg/dL (ref 6–23)
CO2: 22 mEq/L (ref 19–32)
Calcium: 10.2 mg/dL (ref 8.4–10.5)
Chloride: 94 mEq/L — ABNORMAL LOW (ref 96–112)
GFR calc Af Amer: >90 mL/min (ref >90)
GFR calc non Af Amer: >90 mL/min (ref >90)
Glucose, Bld: 383 mg/dL — ABNORMAL HIGH (ref 70–99)
Potassium: 4.2 mEq/L (ref 3.7–5.3)
Sodium: 134 mEq/L — ABNORMAL LOW (ref 137–147)
Total Bilirubin: 0.4 mg/dL (ref 0.3–1.2)
Total Protein: 8.7 g/dL — ABNORMAL HIGH (ref 6.0–8.3)

## 2013-11-26 LAB — CBG MONITORING, ED
Glucose-Capillary: 385 mg/dL — ABNORMAL HIGH (ref 70–99)
Glucose-Capillary: 304 mg/dL — ABNORMAL HIGH (ref 70–99)
Glucose-Capillary: 375 mg/dL — ABNORMAL HIGH (ref 70–99)

## 2013-11-26 LAB — LIPASE, BLOOD: Lipase: 44 U/L (ref 11–59)

## 2013-11-26 MED ORDER — SODIUM CHLORIDE 0.9 % IV BOLUS (SEPSIS)
1000.0000 mL | INTRAVENOUS | Status: AC
  Administered 2013-11-26: 1000 mL via INTRAVENOUS

## 2013-11-26 MED ORDER — INSULIN ASPART 100 UNIT/ML ~~LOC~~ SOLN
8.0000 [IU] | Freq: Once | SUBCUTANEOUS | Status: AC
  Administered 2013-11-26: 8 [IU] via SUBCUTANEOUS
  Filled 2013-11-26: qty 1

## 2013-11-26 MED ORDER — SODIUM CHLORIDE 0.9 % IV BOLUS (SEPSIS)
1000.0000 mL | INTRAVENOUS | Status: AC
Start: 2013-11-26 — End: 2013-11-26
  Administered 2013-11-26: 1000 mL via INTRAVENOUS

## 2013-11-26 NOTE — Discharge Instructions (Signed)
You have been evaluated in ED for elevated blood sugar. It is very important to not drink alcohol, especially in excess as this can cause you to go into DKA (see below for more information).  It is important to take all medications as prescribed and to follow up with your primary care provider for ongoing healthcare needs and ongoing diabetes management.  Return to ER for further evaluation and treatment if you have difficulty managing your blood sugar and develop concerning or worsening symptoms, including unable to keep down fluids or severe abdominal pain. See below for further instructions and home management of diabetes.

## 2013-11-26 NOTE — ED Provider Notes (Signed)
CSN: [id number]     Arrival date & time 11/26/13  0906 History   First MD Initiated Contact with Patient 11/26/13 [phone]     Chief Complaint  Patient presents with  . Hyperglycemia     (Consider location/radiation/quality/duration/timing/severity/associated sxs/prior Treatment) HPI Pt is a 48yo obese female with hx of diabetes, NIDDM, c/o difficulty getting her CBG below 300 since yesterday.  Pt also c/o generalized weakness, with mild generalized headache, nausea, dry mouth, polydipsia and polyuria.  States she has been under a lot of stress lately and admits to "over doing it" by drinking alcohol 2 nights ago.  Pt states she has been trying to manage her CBG at home as they were in the 400-500s and initially went down by drinking water but states she notice her sugar continue to go up today, so decided to come to ED for tx. CBG normally in 100s.  Denies chest pain, SOB, or abdominal pain.  Reports being hospitalized upon dx of diabetes but not since then.   Past Medical History  Diagnosis Date  . Hypertension   . Asthma   . Iron deficiency anemia due to chronic blood loss   . Menometrorrhagia   . Psoriasis   . Sleep apnea   . 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 Obesity 12/18/2012  . Depression   . Anxiety   . Bone pain 06/07/2013  . Lymphadenopathy 06/07/2013  . Sarcoidosis 07/26/2013  . Unspecified deficiency anemia 07/26/2013  . Hernia of abdominal cavity 10/25/2013   Past Surgical History  Procedure Laterality Date  . Tubal ligation     Family History  Problem Relation Age of Onset  . Diabetes type II Father   . Asthma Father   . Hypertension Father   . Heart murmur Father   . Alcohol abuse Father   . Drug abuse Father   . Hypertension Mother   . Alcohol abuse Mother   . Drug abuse Mother    History  Substance Use Topics  . Smoking status: Never Smoker   . Smokeless tobacco: Never Used     Comment: Smokes Marijuana  . Alcohol Use: No   OB History   Grav  Para Term Preterm Abortions TAB SAB Ect Mult Living                 Review of Systems  Constitutional: Positive for fatigue. Negative for fever, chills and diaphoresis.  Respiratory: Negative for shortness of breath.   Cardiovascular: Negative for chest pain.  Gastrointestinal: Positive for nausea. Negative for vomiting, abdominal pain and diarrhea.  Endocrine: Positive for polydipsia, polyphagia and polyuria.  Genitourinary: Negative for dysuria, urgency, frequency, hematuria, flank pain, decreased urine volume, vaginal discharge and vaginal pain.  Neurological: Positive for weakness ( generalized) and headaches. Negative for dizziness and light-headedness.  All other systems reviewed and are negative.     Allergies  Review of patient's allergies indicates no known allergies.  Home Medications   Prior to Admission medications   Medication Sig Start Date End Date Taking? Authorizing Provider  amLODipine (NORVASC) 5 MG tablet Take 5 mg by mouth 2 (two) times daily.     Yes Historical Provider, MD  FLUoxetine (PROZAC) 40 MG capsule Take 40 mg by mouth every morning.   Yes Historical Provider, MD  loratadine (CLARITIN) 10 MG tablet Take 10 mg by mouth daily.     Yes Historical Provider, MD  losartan-hydrochlorothiazide (HYZAAR) 100-12.5 MG per tablet Take 1 tablet by mouth every  morning.  09/20/13  Yes Historical Provider, MD  metFORMIN (GLUCOPHAGE) 1000 MG tablet Take 1,000 mg by mouth 2 (two) times daily with a meal.   Yes Historical Provider, MD   BP 142/118  Pulse 104  Temp(Src) 98 F (36.7 C) (Oral)  Resp 15  Ht 4\' 11"  (1.499 m)  Wt 227 lb (102.967 kg)  BMI 45.82 kg/m2  SpO2 98% Physical Exam  Nursing note and vitals reviewed. Constitutional: She appears well-developed and well-nourished. No distress.  Morbidly obese female lying in exam bed, appears mildly fatigued and uncomfortable, but non-toxic.  HENT:  Head: Normocephalic and atraumatic.  Eyes: Conjunctivae are  normal. No scleral icterus.  Neck: Normal range of motion.  Cardiovascular: Normal rate, regular rhythm and normal heart sounds.   Pulmonary/Chest: Effort normal and breath sounds normal. No respiratory distress. She has no wheezes. She has no rales. She exhibits no tenderness.  No respiratory distress, able to speak in full sentences w/o difficulty. Lungs: CTAB   Abdominal: Soft. Bowel sounds are normal. She exhibits no distension and no mass. There is no tenderness. There is no rebound and no guarding.  Obese abdomen, soft, non-tender.  Musculoskeletal: Normal range of motion.  Neurological: She is alert.  A&O x3  Skin: Skin is warm and dry. She is not diaphoretic.    ED Course  Procedures (including critical care time) Labs Review Labs Reviewed  COMPREHENSIVE METABOLIC PANEL - Abnormal; Notable for the following:    Sodium 134 (*)    Chloride 94 (*)    Glucose, Bld 383 (*)    Total Protein 8.7 (*)    All other components within normal limits  CBC - Abnormal; Notable for the following:    RBC 5.40 (*)    Hemoglobin 11.3 (*)    MCV 66.7 (*)    MCH 20.9 (*)    RDW 18.6 (*)    All other components within normal limits  URINALYSIS, ROUTINE W REFLEX MICROSCOPIC - Abnormal; Notable for the following:    Glucose, UA >1000 (*)    Ketones, ur 15 (*)    All other components within normal limits  URINE MICROSCOPIC-ADD ON - Abnormal; Notable for the following:    Casts HYALINE CASTS (*)    All other components within normal limits  CBG MONITORING, ED - Abnormal; Notable for the following:    Glucose-Capillary 385 (*)    All other components within normal limits  CBG MONITORING, ED - Abnormal; Notable for the following:    Glucose-Capillary 304 (*)    All other components within normal limits  I-STAT CHEM 8, ED - Abnormal; Notable for the following:    Glucose, Bld 320 (*)    All other components within normal limits  CBG MONITORING, ED - Abnormal; Notable for the following:     Glucose-Capillary 375 (*)    All other components within normal limits  LIPASE, BLOOD    Imaging Review No results found.   EKG Interpretation None      MDM   Final diagnoses:  Diabetes mellitus with hyperglycemia    Pt with hx of NIDDM c/o generalized weakness, fatigue, nausea and difficulty breathing blood sugars below 300s. CBG normally in 100s.  Pt initially presenting with mild DKA, anion gap of 18, however pt appears well, A&O x3, non-toxic. NAD.  Repeat chem-8 after 2L fluids, anion gap improved to 11 (WNL). Pt stated she felt better and less nauseated after 2L IV fluids and 8 Units insulin. Pt able  to keep down several ounces of PO fluids.  Pt states she feels comfortable being discharged home to f/u with PCP.  Encouraged pt to drink water, exercise as well as follow up with PCP for recheck of symptoms and ongoing diabetes management.  Return precautions provided. Pt verbalized understanding and agreement with tx plan.   Discussed pt with Dr. Dorna Mai who agrees with tx plan.     Noland Fordyce, PA-C 11/26/13 1446

## 2013-11-26 NOTE — ED Notes (Signed)
Her blood sugar has been over 300 per home monitor since yesterday. She has been unable to get her blood sugar down at home. She c/o "feeling weak, headache and nausea" since yesterday. She is A&Ox4.

## 2013-11-26 NOTE — Telephone Encounter (Signed)
Called and spoke with Cari from [facility] and they are needing the last ov from [facility] to be faxed to [phone].  They are needing to review that the pt has been using the bipap.   Note has been faxed and nothing further is needed.

## 2013-11-27 NOTE — ED Provider Notes (Signed)
Medical screening examination/treatment/procedure(s) were performed by non-physician practitioner and as supervising physician I was immediately available for consultation/collaboration.   EKG Interpretation None        Saddie Benders. Dorna Mai, MD 11/27/13 [phone]

## 2013-12-10 ENCOUNTER — Ambulatory Visit (INDEPENDENT_AMBULATORY_CARE_PROVIDER_SITE_OTHER): Payer: Medicare Other | Admitting: General Surgery

## 2013-12-23 ENCOUNTER — Encounter: Payer: Self-pay | Admitting: Hematology and Oncology

## 2013-12-23 ENCOUNTER — Ambulatory Visit (HOSPITAL_BASED_OUTPATIENT_CLINIC_OR_DEPARTMENT_OTHER): Payer: Medicare Other | Admitting: Hematology and Oncology

## 2013-12-23 ENCOUNTER — Telehealth: Payer: Self-pay | Admitting: Hematology and Oncology

## 2013-12-23 ENCOUNTER — Other Ambulatory Visit (HOSPITAL_BASED_OUTPATIENT_CLINIC_OR_DEPARTMENT_OTHER): Payer: Medicare Other

## 2013-12-23 VITALS — BP 158/84 | HR 109 | Temp 97.9°F | Resp 20 | Ht 59.0 in | Wt 228.4 lb

## 2013-12-23 DIAGNOSIS — K439 Ventral hernia without obstruction or gangrene: Secondary | ICD-10-CM

## 2013-12-23 DIAGNOSIS — D472 Monoclonal gammopathy: Secondary | ICD-10-CM

## 2013-12-23 DIAGNOSIS — K649 Unspecified hemorrhoids: Secondary | ICD-10-CM

## 2013-12-23 DIAGNOSIS — N921 Excessive and frequent menstruation with irregular cycle: Secondary | ICD-10-CM

## 2013-12-23 DIAGNOSIS — R599 Enlarged lymph nodes, unspecified: Secondary | ICD-10-CM

## 2013-12-23 DIAGNOSIS — D509 Iron deficiency anemia, unspecified: Secondary | ICD-10-CM

## 2013-12-23 DIAGNOSIS — D869 Sarcoidosis, unspecified: Secondary | ICD-10-CM

## 2013-12-23 DIAGNOSIS — D72819 Decreased white blood cell count, unspecified: Secondary | ICD-10-CM

## 2013-12-23 DIAGNOSIS — D500 Iron deficiency anemia secondary to blood loss (chronic): Secondary | ICD-10-CM

## 2013-12-23 DIAGNOSIS — R109 Unspecified abdominal pain: Secondary | ICD-10-CM

## 2013-12-23 LAB — CBC & DIFF AND RETIC
BASO%: 0.3 % (ref 0.0–2.0)
BASOS ABS: 0 10*3/uL (ref 0.0–0.1)
EOS%: 2.4 % (ref 0.0–7.0)
Eosinophils Absolute: 0.1 10*3/uL (ref 0.0–0.5)
HCT: 32.5 % — ABNORMAL LOW (ref 34.8–46.6)
HGB: 9.9 g/dL — ABNORMAL LOW (ref 11.6–15.9)
IMMATURE RETIC FRACT: 14.8 % — AB (ref 1.60–10.00)
LYMPH#: 1.1 10*3/uL (ref 0.9–3.3)
LYMPH%: 29.8 % (ref 14.0–49.7)
MCH: 20.4 pg — ABNORMAL LOW (ref 25.1–34.0)
MCHC: 30.5 g/dL — AB (ref 31.5–36.0)
MCV: 66.9 fL — AB (ref 79.5–101.0)
MONO#: 0.3 10*3/uL (ref 0.1–0.9)
MONO%: 8.9 % (ref 0.0–14.0)
NEUT%: 58.6 % (ref 38.4–76.8)
NEUTROS ABS: 2.2 10*3/uL (ref 1.5–6.5)
Platelets: 243 10*3/uL (ref 145–400)
RBC: 4.86 10*6/uL (ref 3.70–5.45)
RDW: 17.8 % — ABNORMAL HIGH (ref 11.2–14.5)
RETIC CT ABS: 100.12 10*3/uL — AB (ref 33.70–90.70)
Retic %: 2.06 % (ref 0.70–2.10)
WBC: 3.7 10*3/uL — ABNORMAL LOW (ref 3.9–10.3)
nRBC: 0 % (ref 0–0)

## 2013-12-23 LAB — FERRITIN CHCC: Ferritin: 37 ng/ml (ref 9–269)

## 2013-12-23 NOTE — Progress Notes (Signed)
Hastings, MD  SUMMARY OF HEMATOLOGIC HISTORY: This is a pleasant 48 year old lady with background history iron deficiency anemia, likely secondary to chronic GI bleed from hemorrhoids. Colonoscopy from 2012 confirmed the diagnosis of internal hemorrhoids. The patient was also found to have lymphadenopathy in the mediastinum and abdomen and she was told she may have diagnosis of sarcoidosis. Imaging study confirmed abnormalities, worrisome for lymphoma. Unfortunately, bone marrow aspirate and biopsy was nondiagnostic. It did come from iron deficiency anemia. 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 INTERVAL HISTORY: Cassandra Burns 48 y.o. female returns for further followup. She has some hoarseness recently due to bronchoscopy. She continued to have persistent hemorrhoidal bleeding. She takes iron supplement twice a day. She has no recent infection.  I have reviewed the past medical history, past surgical history, social history and family history with the patient and they are unchanged from previous note.  ALLERGIES:  has No Known Allergies.  MEDICATIONS:  Current Outpatient Prescriptions  Medication Sig Dispense Refill  . amLODipine (NORVASC) 5 MG tablet Take 5 mg by mouth 2 (two) times daily.        [name] FLUoxetine (PROZAC) 40 MG capsule Take 40 mg by mouth every morning.      . loratadine (CLARITIN) 10 MG tablet Take 10 mg by mouth daily.        [name] losartan-hydrochlorothiazide (HYZAAR) 100-12.5 MG per tablet Take 1 tablet by mouth every morning.       . metFORMIN (GLUCOPHAGE) 1000 MG tablet Take 1,000 mg by mouth 2 (two) times daily with a meal.       No current facility-administered medications for this visit.     REVIEW OF SYSTEMS:   Behavioral/Psych: Mood is stable, no new changes  All other systems were reviewed with the patient and are  negative.  PHYSICAL EXAMINATION: ECOG PERFORMANCE STATUS: 0 - Asymptomatic  Filed Vitals:   12/23/13 1239  BP: 158/84  Pulse: 109  Temp: 97.9 F (36.6 C)  Resp: 20   Filed Weights   12/23/13 1239  Weight: 228 lb 6.4 oz (103.602 kg)    GENERAL:alert, no distress and comfortable. She is morbidly obese SKIN: skin color, texture, turgor are normal, no rashes or significant lesions EYES: normal, Conjunctiva are pink and non-injected, sclera clear Musculoskeletal:no cyanosis of digits and no clubbing  NEURO: alert & oriented x 3 with fluent speech, no focal motor/sensory deficits  LABORATORY DATA:  I have reviewed the data as listed Results for orders placed in visit on 12/23/13 (from the past 48 hour(s))  CBC & DIFF AND RETIC     Status: Abnormal   Collection Time    12/23/13 12:28 PM      Result Value Ref Range   WBC 3.7 (*) 3.9 - 10.3 10e3/uL   NEUT# 2.2  1.5 - 6.5 10e3/uL   HGB 9.9 (*) 11.6 - 15.9 g/dL   HCT 32.5 (*) 34.8 - 46.6 %   Platelets 243  145 - 400 10e3/uL   MCV 66.9 (*) 79.5 - 101.0 fL   MCH 20.4 (*) 25.1 - 34.0 pg   MCHC 30.5 (*) 31.5 - 36.0 g/dL   RBC 4.86  3.70 - 5.45 10e6/uL   RDW 17.8 (*) 11.2 - 14.5 %   lymph# 1.1  0.9 - 3.3 10e3/uL   MONO# 0.3  0.1 - 0.9 10e3/uL   Eosinophils Absolute 0.1  0.0 -  0.5 10e3/uL   Basophils Absolute 0.0  0.0 - 0.1 10e3/uL   NEUT% 58.6  38.4 - 76.8 %   LYMPH% 29.8  14.0 - 49.7 %   MONO% 8.9  0.0 - 14.0 %   EOS% 2.4  0.0 - 7.0 %   BASO% 0.3  0.0 - 2.0 %   nRBC 0  0 - 0 %   Retic % 2.06  0.70 - 2.10 %   Retic Ct Abs 100.12 (*) 33.70 - 90.70 10e3/uL   Immature Retic Fract 14.80 (*) 1.60 - 10.00 %  FERRITIN [facility]     Status: None   Collection Time    12/23/13 12:28 PM      Result Value Ref Range   Ferritin 37  9 - 269 ng/ml    Lab Results  Component Value Date   WBC 3.7* 12/23/2013   HGB 9.9* 12/23/2013   HCT 32.5* 12/23/2013   MCV 66.9* 12/23/2013   PLT 243 12/23/2013   ASSESSMENT & PLAN:  #1 lymphadenopathy due to  sarcoidosis #2 elevated IgM kappa #3 leukopenia  Overall, bone marrow aspirate and biopsy was nondiagnostic. Biopsy of mediastinal lymph node consistent with sarcoidosis. Recommend observation only. #4 chronic hemorrhoidal bleeding #5 severe ventral hernia #6 chronic abdominal pain  I recommend referral to Gen. Surgery for treatment of hemorrhoid surgery.  In the meantime, I recommend she continue on oral iron supplement twice a day. The patient will come back every month to have a CBC and iron studies rechecked. If her anemia gets worse, I recommend intravenous iron infusion and she agreed. All questions were answered. The patient knows to call the clinic with any problems, questions or concerns. No barriers to learning was detected.  I spent 15 minutes counseling the patient face to face. The total time spent in the appointment was 20 minutes and more than 50% was on counseling.     Heath Lark, MD 12/23/2013 4:14 PM

## 2013-12-23 NOTE — Telephone Encounter (Signed)
gv adn printed appt sched adn avs for pt for June thru Sept

## 2014-01-20 ENCOUNTER — Other Ambulatory Visit: Payer: Self-pay

## 2014-01-28 ENCOUNTER — Emergency Department (HOSPITAL_COMMUNITY)
Admission: EM | Admit: 2014-01-28 | Discharge: 2014-01-28 | Disposition: A | Discharge status: Home / Self Care | Payer: Medicare Other | Source: Home / Self Care | Attending: Family Medicine | Admitting: Family Medicine

## 2014-01-28 ENCOUNTER — Emergency Department (INDEPENDENT_AMBULATORY_CARE_PROVIDER_SITE_OTHER): Payer: Medicare Other

## 2014-01-28 ENCOUNTER — Encounter (HOSPITAL_COMMUNITY): Payer: Self-pay | Admitting: Emergency Medicine

## 2014-01-28 DIAGNOSIS — M19019 Primary osteoarthritis, unspecified shoulder: Secondary | ICD-10-CM

## 2014-01-28 DIAGNOSIS — M19012 Primary osteoarthritis, left shoulder: Secondary | ICD-10-CM

## 2014-01-28 MED ORDER — MELOXICAM 7.5 MG PO TABS: 7.5000 mg | ORAL_TABLET | Freq: Two times a day (BID) | ORAL | Status: DC

## 2014-01-28 NOTE — ED Provider Notes (Signed)
CSN: [id number]     Arrival date & time 01/28/14  0822 History   First MD Initiated Contact with Patient 01/28/14 0831     Chief Complaint  Patient presents with  . Extremity Pain   (Consider location/radiation/quality/duration/timing/severity/associated sxs/prior Treatment) Patient is a 48 y.o. female presenting with extremity pain. The history is provided by the patient.  Extremity Pain This is a new problem. The current episode started 2 days ago (NKI, no h/o shoulder problems, no overuse reported.). The problem has been gradually worsening. Pertinent negatives include no chest pain, no abdominal pain and no shortness of breath.    Past Medical History  Diagnosis Date  . Hypertension   . Asthma   . Iron deficiency anemia due to chronic blood loss   . Menometrorrhagia   . Psoriasis   . Sleep apnea   . 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 Obesity 12/18/2012  . Depression   . Anxiety   . Bone pain 06/07/2013  . Lymphadenopathy 06/07/2013  . Sarcoidosis 07/26/2013  . Unspecified deficiency anemia 07/26/2013  . Hernia of abdominal cavity 10/25/2013   Past Surgical History  Procedure Laterality Date  . Tubal ligation     Family History  Problem Relation Age of Onset  . Diabetes type II Father   . Asthma Father   . Hypertension Father   . Heart murmur Father   . Alcohol abuse Father   . Drug abuse Father   . Hypertension Mother   . Alcohol abuse Mother   . Drug abuse Mother    History  Substance Use Topics  . Smoking status: Never Smoker   . Smokeless tobacco: Never Used     Comment: Smokes Marijuana  . Alcohol Use: No   OB History   Grav Para Term Preterm Abortions TAB SAB Ect Mult Living                 Review of Systems  Constitutional: Negative.   Respiratory: Negative for shortness of breath.   Cardiovascular: Negative for chest pain.  Gastrointestinal: Negative.  Negative for abdominal pain.  Musculoskeletal: Positive for myalgias. Negative for joint  swelling.  Skin: Negative.     Allergies  Review of patient's allergies indicates no known allergies.  Home Medications   Prior to Admission medications   Medication Sig Start Date End Date Taking? Authorizing Provider  amLODipine (NORVASC) 5 MG tablet Take 5 mg by mouth 2 (two) times daily.      Historical Provider, MD  FLUoxetine (PROZAC) 40 MG capsule Take 40 mg by mouth every morning.    Historical Provider, MD  loratadine (CLARITIN) 10 MG tablet Take 10 mg by mouth daily.      Historical Provider, MD  losartan-hydrochlorothiazide (HYZAAR) 100-12.5 MG per tablet Take 1 tablet by mouth every morning.  09/20/13   Historical Provider, MD  meloxicam (MOBIC) 7.5 MG tablet Take 1 tablet (7.5 mg total) by mouth 2 (two) times daily after a meal. 01/28/14   Billy Fischer, MD  metFORMIN (GLUCOPHAGE) 1000 MG tablet Take 1,000 mg by mouth 2 (two) times daily with a meal.    Historical Provider, MD   BP 139/88  Pulse 92  Temp(Src) 97.2 F (36.2 C) (Oral)  Resp 18  SpO2 92% Physical Exam  Nursing note and vitals reviewed. Constitutional: She is oriented to person, place, and time.  Cardiovascular: Normal rate.   Pulmonary/Chest: Effort normal and breath sounds normal.  Musculoskeletal: She exhibits tenderness.  Left shoulder: She exhibits decreased range of motion, tenderness, bony tenderness, pain and spasm. She exhibits no swelling, no crepitus, normal pulse and normal strength.  Neurological: She is alert and oriented to person, place, and time.  Skin: Skin is warm and dry.    ED Course  Procedures (including critical care time) Labs Review Labs Reviewed - No data to display  Imaging Review Dg Shoulder Left  01/28/2014   CLINICAL DATA:  EXTREMITY PAIN  EXAM: LEFT SHOULDER - 2+ VIEW  COMPARISON:  None.  FINDINGS: No acute fracture or dislocation. Hypertrophic spurring along the the glenoid and humeral head. Degenerative changes within the acromioclavicular joint. Some soft tissues  unremarkable.  IMPRESSION: Osteoarthritic changes.  No acute osseous abnormalities.   Electronically Signed   By: Margaree Mackintosh M.D.   On: 01/28/2014 09:49    X-rays reviewed and report per radiologist.  MDM   1. Primary osteoarthritis of left shoulder        Billy Fischer, MD 01/28/14 1007

## 2014-01-28 NOTE — ED Notes (Signed)
Pt  Reports  Pain   l  Arm      X  2  Days     unreleived  By  Ointment

## 2014-01-28 NOTE — Discharge Instructions (Signed)
Ice and sling and medicine as needed, see orthopedist if further problems.

## 2014-02-17 ENCOUNTER — Other Ambulatory Visit: Payer: Self-pay | Admitting: Hematology and Oncology

## 2014-02-17 ENCOUNTER — Telehealth: Payer: Self-pay | Admitting: *Deleted

## 2014-02-17 ENCOUNTER — Other Ambulatory Visit (HOSPITAL_BASED_OUTPATIENT_CLINIC_OR_DEPARTMENT_OTHER): Payer: Medicare Other

## 2014-02-17 DIAGNOSIS — D509 Iron deficiency anemia, unspecified: Secondary | ICD-10-CM

## 2014-02-17 DIAGNOSIS — D500 Iron deficiency anemia secondary to blood loss (chronic): Secondary | ICD-10-CM

## 2014-02-17 LAB — CBC & DIFF AND RETIC
BASO%: 0.3 % (ref 0.0–2.0)
Basophils Absolute: 0 10*3/uL (ref 0.0–0.1)
EOS ABS: 0.1 10*3/uL (ref 0.0–0.5)
EOS%: 2.6 % (ref 0.0–7.0)
HCT: 32.4 % — ABNORMAL LOW (ref 34.8–46.6)
HEMOGLOBIN: 9.8 g/dL — AB (ref 11.6–15.9)
IMMATURE RETIC FRACT: 16.6 % — AB (ref 1.60–10.00)
LYMPH%: 26.7 % (ref 14.0–49.7)
MCH: 19.6 pg — ABNORMAL LOW (ref 25.1–34.0)
MCHC: 30.2 g/dL — ABNORMAL LOW (ref 31.5–36.0)
MCV: 64.7 fL — ABNORMAL LOW (ref 79.5–101.0)
MONO#: 0.2 10*3/uL (ref 0.1–0.9)
MONO%: 6 % (ref 0.0–14.0)
NEUT%: 64.4 % (ref 38.4–76.8)
NEUTROS ABS: 2.2 10*3/uL (ref 1.5–6.5)
PLATELETS: 201 10*3/uL (ref 145–400)
RBC: 5.01 10*6/uL (ref 3.70–5.45)
RDW: 18 % — AB (ref 11.2–14.5)
RETIC %: 1.56 % (ref 0.70–2.10)
RETIC CT ABS: 78.16 10*3/uL (ref 33.70–90.70)
WBC: 3.5 10*3/uL — AB (ref 3.9–10.3)
lymph#: 0.9 10*3/uL (ref 0.9–3.3)
nRBC: 0 % (ref 0–0)

## 2014-02-17 LAB — FERRITIN CHCC: FERRITIN: 33 ng/mL (ref 9–269)

## 2014-02-17 NOTE — Telephone Encounter (Signed)
Informed pt of low Ferritin level and Dr. Alvy Bimler ordered IV iron tomorrow at 8:15 am.  Pt agreed.

## 2014-02-17 NOTE — Telephone Encounter (Signed)
Order signed.

## 2014-02-17 NOTE — Telephone Encounter (Signed)
Per staff message  I have scheduled appt. Desk RN to call pt

## 2014-02-17 NOTE — Telephone Encounter (Signed)
Message copied by Cathlean Cower on Thu Feb 17, 2014  2:04 PM ------      Message from: [facility], [place]: Thu Feb 17, 2014  1:56 PM      Regarding: needs IV iron       Can she come tomorrow of iv iron?      ----- Message -----         From: [facility] Interface         Sent: 02/17/2014  12:26 PM           To: Heath Lark, MD                   ------

## 2014-02-18 ENCOUNTER — Other Ambulatory Visit: Payer: Self-pay | Admitting: *Deleted

## 2014-02-18 ENCOUNTER — Ambulatory Visit: Payer: Self-pay

## 2014-02-18 ENCOUNTER — Telehealth: Payer: Self-pay | Admitting: *Deleted

## 2014-02-18 ENCOUNTER — Ambulatory Visit (HOSPITAL_BASED_OUTPATIENT_CLINIC_OR_DEPARTMENT_OTHER): Payer: Medicare Other

## 2014-02-18 VITALS — BP 155/96 | HR 87 | Temp 97.6°F

## 2014-02-18 DIAGNOSIS — D509 Iron deficiency anemia, unspecified: Secondary | ICD-10-CM

## 2014-02-18 DIAGNOSIS — D500 Iron deficiency anemia secondary to blood loss (chronic): Secondary | ICD-10-CM

## 2014-02-18 MED ORDER — SODIUM CHLORIDE 0.9 % IV SOLN: Freq: Once | INTRAVENOUS | Status: DC

## 2014-02-18 MED ORDER — SODIUM CHLORIDE 0.9 % IV SOLN
1020.0000 mg | Freq: Once | INTRAVENOUS | Status: AC
  Administered 2014-02-18: 1020 mg via INTRAVENOUS
  Filled 2014-02-18: qty 34

## 2014-02-18 NOTE — Progress Notes (Signed)
30 minute observation post iron infusion--pt tolerated well, BP 151 /82.  HR 84.  Pt asymptomatic.  SLJ

## 2014-02-18 NOTE — Telephone Encounter (Signed)
Pt states she over slept this morning and missed her infusion appt.[name]  Charge RN states she can come in at 2 pm this afternoon.  Notified pt and she agreed.

## 2014-02-18 NOTE — Patient Instructions (Signed)

## 2014-03-17 ENCOUNTER — Other Ambulatory Visit: Payer: Self-pay

## 2014-04-14 ENCOUNTER — Telehealth: Payer: Self-pay | Admitting: Hematology and Oncology

## 2014-04-14 ENCOUNTER — Encounter: Payer: Self-pay | Admitting: Hematology and Oncology

## 2014-04-14 ENCOUNTER — Ambulatory Visit (HOSPITAL_BASED_OUTPATIENT_CLINIC_OR_DEPARTMENT_OTHER): Payer: Medicare Other | Admitting: Hematology and Oncology

## 2014-04-14 ENCOUNTER — Other Ambulatory Visit (HOSPITAL_BASED_OUTPATIENT_CLINIC_OR_DEPARTMENT_OTHER): Payer: Medicare Other

## 2014-04-14 VITALS — BP 179/86 | HR 86 | Temp 98.5°F | Resp 20 | Ht 59.0 in | Wt 223.8 lb

## 2014-04-14 DIAGNOSIS — I10 Essential (primary) hypertension: Secondary | ICD-10-CM

## 2014-04-14 DIAGNOSIS — R591 Generalized enlarged lymph nodes: Secondary | ICD-10-CM

## 2014-04-14 DIAGNOSIS — N920 Excessive and frequent menstruation with regular cycle: Secondary | ICD-10-CM

## 2014-04-14 DIAGNOSIS — N921 Excessive and frequent menstruation with irregular cycle: Secondary | ICD-10-CM

## 2014-04-14 DIAGNOSIS — R590 Localized enlarged lymph nodes: Secondary | ICD-10-CM

## 2014-04-14 DIAGNOSIS — R599 Enlarged lymph nodes, unspecified: Secondary | ICD-10-CM

## 2014-04-14 DIAGNOSIS — D500 Iron deficiency anemia secondary to blood loss (chronic): Secondary | ICD-10-CM

## 2014-04-14 DIAGNOSIS — K625 Hemorrhage of anus and rectum: Secondary | ICD-10-CM | POA: Insufficient documentation

## 2014-04-14 LAB — CBC & DIFF AND RETIC
BASO%: 0.6 % (ref 0.0–2.0)
Basophils Absolute: 0 10*3/uL (ref 0.0–0.1)
EOS%: 2.5 % (ref 0.0–7.0)
Eosinophils Absolute: 0.1 10*3/uL (ref 0.0–0.5)
HCT: 36 % (ref 34.8–46.6)
HGB: 10.9 g/dL — ABNORMAL LOW (ref 11.6–15.9)
Immature Retic Fract: 15.7 % — ABNORMAL HIGH (ref 1.60–10.00)
LYMPH#: 0.9 10*3/uL (ref 0.9–3.3)
LYMPH%: 27 % (ref 14.0–49.7)
MCH: 21.2 pg — AB (ref 25.1–34.0)
MCHC: 30.3 g/dL — ABNORMAL LOW (ref 31.5–36.0)
MCV: 69.9 fL — ABNORMAL LOW (ref 79.5–101.0)
MONO#: 0.3 10*3/uL (ref 0.1–0.9)
MONO%: 8.3 % (ref 0.0–14.0)
NEUT#: 1.9 10*3/uL (ref 1.5–6.5)
NEUT%: 61.6 % (ref 38.4–76.8)
Platelets: 193 10*3/uL (ref 145–400)
RBC: 5.15 10*6/uL (ref 3.70–5.45)
RDW: 23 % — AB (ref 11.2–14.5)
RETIC CT ABS: 101.46 10*3/uL — AB (ref 33.70–90.70)
Retic %: 1.97 % (ref 0.70–2.10)
WBC: 3.2 10*3/uL — ABNORMAL LOW (ref 3.9–10.3)

## 2014-04-14 NOTE — Progress Notes (Signed)
[place], MD SUMMARY OF HEMATOLOGIC HISTORY: This is a pleasant 48 year old lady with background history iron deficiency anemia, likely secondary to chronic GI bleed from hemorrhoids. Colonoscopy from 2012 confirmed the diagnosis of internal hemorrhoids. The patient was also found to have lymphadenopathy in the mediastinum and abdomen and she was told she may have diagnosis of sarcoidosis. Imaging study confirmed abnormalities, worrisome for lymphoma. Unfortunately, bone marrow aspirate and biopsy was nondiagnostic. It did confirmed iron deficiency anemia. 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 INTERVAL HISTORY: Cassandra Burns 49 y.o. female returns for further followup. She denies further rectal bleeding. She had no excessive menorrhagia.  I have reviewed the past medical history, past surgical history, social history and family history with the patient and they are unchanged from previous note.  ALLERGIES:  has No Known Allergies.  MEDICATIONS:  Current Outpatient Prescriptions  Medication Sig Dispense Refill  . amLODipine (NORVASC) 5 MG tablet Take 5 mg by mouth 2 (two) times daily.        [name] FLUoxetine (PROZAC) 40 MG capsule Take 40 mg by mouth every morning.      . loratadine (CLARITIN) 10 MG tablet Take 10 mg by mouth daily.        [name] losartan-hydrochlorothiazide (HYZAAR) 100-12.5 MG per tablet Take 1 tablet by mouth every morning.       . meloxicam (MOBIC) 7.5 MG tablet Take 1 tablet (7.5 mg total) by mouth 2 (two) times daily after a meal.  30 tablet  1  . metFORMIN (GLUCOPHAGE) 1000 MG tablet Take 1,000 mg by mouth 2 (two) times daily with a meal.       No current facility-administered medications for this visit.     REVIEW OF SYSTEMS:   Constitutional: Denies fevers, chills or night sweats Eyes: Denies blurriness of vision Ears, nose,  mouth, throat, and face: Denies mucositis or sore throat Respiratory: Denies cough, dyspnea or wheezes Cardiovascular: Denies palpitation, chest discomfort or lower extremity swelling Gastrointestinal:  Denies nausea, heartburn or change in bowel habits Skin: Denies abnormal skin rashes Lymphatics: Denies new lymphadenopathy or easy bruising Neurological:Denies numbness, tingling or new weaknesses Behavioral/Psych: Mood is stable, no new changes  All other systems were reviewed with the patient and are negative.  PHYSICAL EXAMINATION: ECOG PERFORMANCE STATUS: 1 - Symptomatic but completely ambulatory  Filed Vitals:   04/14/14 1219  BP: 179/86  Pulse: 86  Temp: 98.5 F (36.9 C)  Resp: 20   Filed Weights   04/14/14 1219  Weight: 223 lb 12.8 oz (101.515 kg)    GENERAL:alert, no distress and comfortable. She is morbidly obese SKIN: skin color, texture, turgor are normal, no rashes or significant lesions EYES: normal, Conjunctiva are pink and non-injected, sclera clear Musculoskeletal:no cyanosis of digits and no clubbing  NEURO: alert & oriented x 3 with fluent speech, no focal motor/sensory deficits  LABORATORY DATA:  I have reviewed the data as listed Results for orders placed in visit on 04/14/14 (from the past 48 hour(s))  CBC & DIFF AND RETIC     Status: Abnormal   Collection Time    04/14/14 11:58 AM      Result Value Ref Range   WBC 3.2 (*) 3.9 - 10.3 10e3/uL   NEUT# 1.9  1.5 - 6.5 10e3/uL   HGB 10.9 (*) 11.6 - 15.9 g/dL   HCT 36.0  34.8 - 46.6 %  Platelets 193  145 - 400 10e3/uL   MCV 69.9 (*) 79.5 - 101.0 fL   MCH 21.2 (*) 25.1 - 34.0 pg   MCHC 30.3 (*) 31.5 - 36.0 g/dL   RBC 5.15  3.70 - 5.45 10e6/uL   RDW 23.0 (*) 11.2 - 14.5 %   lymph# 0.9  0.9 - 3.3 10e3/uL   MONO# 0.3  0.1 - 0.9 10e3/uL   Eosinophils Absolute 0.1  0.0 - 0.5 10e3/uL   Basophils Absolute 0.0  0.0 - 0.1 10e3/uL   NEUT% 61.6  38.4 - 76.8 %   LYMPH% 27.0  14.0 - 49.7 %   MONO% 8.3  0.0 -  14.0 %   EOS% 2.5  0.0 - 7.0 %   BASO% 0.6  0.0 - 2.0 %   Retic % 1.97  0.70 - 2.10 %   Retic Ct Abs 101.46 (*) 33.70 - 90.70 10e3/uL   Immature Retic Fract 15.70 (*) 1.60 - 10.00 %    Lab Results  Component Value Date   WBC 3.2* 04/14/2014   HGB 10.9* 04/14/2014   HCT 36.0 04/14/2014   MCV 69.9* 04/14/2014   PLT 193 04/14/2014    ASSESSMENT & PLAN:  Mediastinal lymphadenopathy She is currently on observation. I will see her back in 3 months or the history, physical examination and imaging study.  Menometrorrhagia She has not had period recently.  Iron deficiency anemia due to chronic blood loss Overall, her anemia has improved. Iron studies are still pending. I recommend she continue taking iron supplements  Hypertension The patient felt that this is due to white coat hypertension. Recommend she follows up with primary care provider for medication adjustment.  Rectal bleeding This is a less frequency and she has dietary modification.    All questions were answered. The patient knows to call the clinic with any problems, questions or concerns. No barriers to learning was detected.  I spent 15 minutes counseling the patient face to face. The total time spent in the appointment was 20 minutes and more than 50% was on counseling.     [facility], [place], MD 04/14/2014 9:49 PM

## 2014-04-14 NOTE — Assessment & Plan Note (Signed)
She is currently on observation. I will see her back in 3 months or the history, physical examination and imaging study.

## 2014-04-14 NOTE — Assessment & Plan Note (Signed)
She has not had period recently.

## 2014-04-14 NOTE — Assessment & Plan Note (Signed)
Overall, her anemia has improved. Iron studies are still pending. I recommend she continue taking iron supplements

## 2014-04-14 NOTE — Assessment & Plan Note (Signed)
The patient felt that this is due to white coat hypertension. Recommend she follows up with primary care provider for medication adjustment.

## 2014-04-14 NOTE — Assessment & Plan Note (Signed)
This is a less frequency and she has dietary modification.

## 2014-04-14 NOTE — Telephone Encounter (Signed)
gv adn printed appt sched and avs for pt fro Dec...gv pt barium

## 2014-04-15 ENCOUNTER — Telehealth: Payer: Self-pay | Admitting: *Deleted

## 2014-04-15 LAB — FERRITIN CHCC: Ferritin: 189 ng/ml (ref 9–269)

## 2014-04-15 NOTE — Telephone Encounter (Signed)
Message copied by Cathlean Cower on Fri Apr 15, 2014 11:35 AM ------      Message from: [facility], [place]      Created: Fri Apr 15, 2014  9:48 AM      Regarding: ferritin level       Let her know this is good      ----- Message -----         From: [facility] Interface         Sent: 04/14/2014  12:12 PM           To: Heath Lark, MD                   ------

## 2014-04-15 NOTE — Telephone Encounter (Signed)
Informed pt of Ferritin level much improved.  She verbalized understanding.

## 2014-07-10 ENCOUNTER — Emergency Department (HOSPITAL_COMMUNITY): Payer: Medicare Other

## 2014-07-10 ENCOUNTER — Inpatient Hospital Stay (HOSPITAL_COMMUNITY)
Admission: EM | Admit: 2014-07-10 | Discharge: 2014-07-13 | DRG: 305 | Disposition: A | Discharge status: Home / Self Care | Payer: Medicare Other | Attending: Pulmonary Disease | Admitting: Pulmonary Disease

## 2014-07-10 ENCOUNTER — Encounter (HOSPITAL_COMMUNITY): Payer: Self-pay | Admitting: Emergency Medicine

## 2014-07-10 DIAGNOSIS — J45909 Unspecified asthma, uncomplicated: Secondary | ICD-10-CM | POA: Diagnosis present

## 2014-07-10 DIAGNOSIS — F419 Anxiety disorder, unspecified: Secondary | ICD-10-CM | POA: Diagnosis present

## 2014-07-10 DIAGNOSIS — R05 Cough: Secondary | ICD-10-CM

## 2014-07-10 DIAGNOSIS — D649 Anemia, unspecified: Secondary | ICD-10-CM | POA: Diagnosis present

## 2014-07-10 DIAGNOSIS — G4730 Sleep apnea, unspecified: Secondary | ICD-10-CM | POA: Diagnosis present

## 2014-07-10 DIAGNOSIS — R079 Chest pain, unspecified: Secondary | ICD-10-CM

## 2014-07-10 DIAGNOSIS — F329 Major depressive disorder, single episode, unspecified: Secondary | ICD-10-CM | POA: Diagnosis present

## 2014-07-10 DIAGNOSIS — I10 Essential (primary) hypertension: Secondary | ICD-10-CM | POA: Diagnosis present

## 2014-07-10 DIAGNOSIS — Z79899 Other long term (current) drug therapy: Secondary | ICD-10-CM

## 2014-07-10 DIAGNOSIS — I161 Hypertensive emergency: Secondary | ICD-10-CM | POA: Diagnosis present

## 2014-07-10 DIAGNOSIS — E119 Type 2 diabetes mellitus without complications: Secondary | ICD-10-CM | POA: Diagnosis present

## 2014-07-10 DIAGNOSIS — N390 Urinary tract infection, site not specified: Secondary | ICD-10-CM

## 2014-07-10 DIAGNOSIS — J811 Chronic pulmonary edema: Secondary | ICD-10-CM | POA: Diagnosis present

## 2014-07-10 DIAGNOSIS — F17210 Nicotine dependence, cigarettes, uncomplicated: Secondary | ICD-10-CM | POA: Diagnosis present

## 2014-07-10 DIAGNOSIS — R112 Nausea with vomiting, unspecified: Secondary | ICD-10-CM | POA: Diagnosis present

## 2014-07-10 DIAGNOSIS — R0602 Shortness of breath: Secondary | ICD-10-CM

## 2014-07-10 DIAGNOSIS — R059 Cough, unspecified: Secondary | ICD-10-CM

## 2014-07-10 DIAGNOSIS — E8881 Metabolic syndrome: Secondary | ICD-10-CM | POA: Diagnosis present

## 2014-07-10 DIAGNOSIS — R51 Headache: Secondary | ICD-10-CM | POA: Diagnosis present

## 2014-07-10 DIAGNOSIS — D869 Sarcoidosis, unspecified: Secondary | ICD-10-CM | POA: Diagnosis present

## 2014-07-10 DIAGNOSIS — R739 Hyperglycemia, unspecified: Secondary | ICD-10-CM

## 2014-07-10 DIAGNOSIS — R519 Headache, unspecified: Secondary | ICD-10-CM

## 2014-07-10 DIAGNOSIS — Z6841 Body Mass Index (BMI) 40.0 and over, adult: Secondary | ICD-10-CM

## 2014-07-10 DIAGNOSIS — F1290 Cannabis use, unspecified, uncomplicated: Secondary | ICD-10-CM | POA: Diagnosis present

## 2014-07-10 DIAGNOSIS — I160 Hypertensive urgency: Secondary | ICD-10-CM

## 2014-07-10 DIAGNOSIS — D860 Sarcoidosis of lung: Secondary | ICD-10-CM | POA: Diagnosis present

## 2014-07-10 DIAGNOSIS — G4733 Obstructive sleep apnea (adult) (pediatric): Secondary | ICD-10-CM | POA: Diagnosis present

## 2014-07-10 DIAGNOSIS — D500 Iron deficiency anemia secondary to blood loss (chronic): Secondary | ICD-10-CM | POA: Diagnosis present

## 2014-07-10 LAB — PRO B NATRIURETIC PEPTIDE: Pro B Natriuretic peptide (BNP): 175.8 pg/mL — ABNORMAL HIGH (ref 0–125)

## 2014-07-10 LAB — BASIC METABOLIC PANEL
Anion gap: 17 — ABNORMAL HIGH (ref 5–15)
BUN: 10 mg/dL (ref 6–23)
CO2: 22 mEq/L (ref 19–32)
Calcium: 9.7 mg/dL (ref 8.4–10.5)
Chloride: 95 mEq/L — ABNORMAL LOW (ref 96–112)
Creatinine, Ser: 0.63 mg/dL (ref 0.50–1.10)
Glucose, Bld: 256 mg/dL — ABNORMAL HIGH (ref 70–99)
Potassium: 3.9 mEq/L (ref 3.7–5.3)
Sodium: 134 mEq/L — ABNORMAL LOW (ref 137–147)

## 2014-07-10 LAB — URINALYSIS, ROUTINE W REFLEX MICROSCOPIC
BILIRUBIN URINE: NEGATIVE
Glucose, UA: NEGATIVE mg/dL
HGB URINE DIPSTICK: NEGATIVE
Ketones, ur: NEGATIVE mg/dL
Leukocytes, UA: NEGATIVE
Nitrite: POSITIVE — AB
PH: 6.5 (ref 5.0–8.0)
Protein, ur: 30 mg/dL — AB
Specific Gravity, Urine: >1.046 — ABNORMAL HIGH (ref 1.005–1.030)
Urobilinogen, UA: 1 mg/dL (ref 0.0–1.0)

## 2014-07-10 LAB — CBC
HEMATOCRIT: 37.3 % (ref 36.0–46.0)
Hemoglobin: 11.3 g/dL — ABNORMAL LOW (ref 12.0–15.0)
MCH: 21.4 pg — ABNORMAL LOW (ref 26.0–34.0)
MCHC: 30.3 g/dL (ref 30.0–36.0)
MCV: 70.6 fL — AB (ref 78.0–100.0)
Platelets: 203 10*3/uL (ref 150–400)
RBC: 5.28 MIL/uL — ABNORMAL HIGH (ref 3.87–5.11)
RDW: 16.3 % — AB (ref 11.5–15.5)
WBC: 3.7 10*3/uL — ABNORMAL LOW (ref 4.0–10.5)

## 2014-07-10 LAB — URINE MICROSCOPIC-ADD ON

## 2014-07-10 LAB — I-STAT TROPONIN, ED
Troponin i, poc: 0 ng/mL (ref 0.00–0.08)
Troponin i, poc: 0 ng/mL (ref 0.00–0.08)

## 2014-07-10 LAB — D-DIMER, QUANTITATIVE (NOT AT ARMC): D DIMER QUANT: 0.99 ug{FEU}/mL — AB (ref 0.00–0.48)

## 2014-07-10 LAB — CBG MONITORING, ED: Glucose-Capillary: 235 mg/dL — ABNORMAL HIGH (ref 70–99)

## 2014-07-10 MED ORDER — LORATADINE 10 MG PO TABS: 10.0000 mg | ORAL_TABLET | Freq: Every day | ORAL | Status: DC

## 2014-07-10 MED ORDER — NICARDIPINE HCL IN NACL 20-0.86 MG/200ML-% IV SOLN
3.0000 mg/h | INTRAVENOUS | Status: DC
  Administered 2014-07-10: 5 mg/h via INTRAVENOUS
  Filled 2014-07-10: qty 200

## 2014-07-10 MED ORDER — HYDRALAZINE HCL 20 MG/ML IJ SOLN: 10.0000 mg | Freq: Once | INTRAMUSCULAR | Status: DC

## 2014-07-10 MED ORDER — ALBUTEROL SULFATE HFA 108 (90 BASE) MCG/ACT IN AERS: 2.0000 | INHALATION_SPRAY | RESPIRATORY_TRACT | Status: DC | PRN

## 2014-07-10 MED ORDER — LABETALOL HCL 5 MG/ML IV SOLN
20.0000 mg | Freq: Once | INTRAVENOUS | Status: AC
  Administered 2014-07-10: 20 mg via INTRAVENOUS
  Filled 2014-07-10: qty 4

## 2014-07-10 MED ORDER — MORPHINE SULFATE 4 MG/ML IJ SOLN: 4.0000 mg | Freq: Once | INTRAMUSCULAR | Status: DC

## 2014-07-10 MED ORDER — AMLODIPINE BESYLATE 5 MG PO TABS
10.0000 mg | ORAL_TABLET | Freq: Every day | ORAL | Status: DC
  Filled 2014-07-10: qty 2

## 2014-07-10 MED ORDER — MORPHINE SULFATE 4 MG/ML IJ SOLN
4.0000 mg | Freq: Once | INTRAMUSCULAR | Status: AC
  Administered 2014-07-10: 4 mg via INTRAVENOUS
  Filled 2014-07-10: qty 1

## 2014-07-10 MED ORDER — HYDROCODONE-ACETAMINOPHEN 7.5-325 MG/15ML PO SOLN: 10.0000 mL | Freq: Once | ORAL | Status: DC

## 2014-07-10 MED ORDER — LABETALOL HCL 5 MG/ML IV SOLN
20.0000 mg | INTRAVENOUS | Status: AC | PRN
  Administered 2014-07-10 (×3): 20 mg via INTRAVENOUS
  Filled 2014-07-10 (×2): qty 4

## 2014-07-10 MED ORDER — LOSARTAN POTASSIUM-HCTZ 100-12.5 MG PO TABS: 1.0000 | ORAL_TABLET | Freq: Every day | ORAL | Status: DC

## 2014-07-10 MED ORDER — NAPROXEN 500 MG PO TABS: 500.0000 mg | ORAL_TABLET | Freq: Two times a day (BID) | ORAL | Status: DC | PRN

## 2014-07-10 MED ORDER — ALBUTEROL SULFATE (2.5 MG/3ML) 0.083% IN NEBU
5.0000 mg | INHALATION_SOLUTION | Freq: Once | RESPIRATORY_TRACT | Status: AC
  Administered 2014-07-10: 5 mg via RESPIRATORY_TRACT
  Filled 2014-07-10: qty 6

## 2014-07-10 MED ORDER — ONDANSETRON HCL 4 MG/2ML IJ SOLN
4.0000 mg | Freq: Four times a day (QID) | INTRAMUSCULAR | Status: DC | PRN
  Administered 2014-07-10: 4 mg via INTRAVENOUS
  Filled 2014-07-10: qty 2

## 2014-07-10 MED ORDER — HYDROCODONE-ACETAMINOPHEN 5-325 MG PO TABS
1.0000 | ORAL_TABLET | Freq: Once | ORAL | Status: AC
  Administered 2014-07-10: 1 via ORAL
  Filled 2014-07-10: qty 1

## 2014-07-10 MED ORDER — IPRATROPIUM BROMIDE 0.02 % IN SOLN
0.5000 mg | Freq: Once | RESPIRATORY_TRACT | Status: AC
  Administered 2014-07-10: 0.5 mg via RESPIRATORY_TRACT
  Filled 2014-07-10: qty 2.5

## 2014-07-10 MED ORDER — AMLODIPINE BESYLATE 5 MG PO TABS
10.0000 mg | ORAL_TABLET | Freq: Once | ORAL | Status: AC
  Administered 2014-07-10: 10 mg via ORAL

## 2014-07-10 MED ORDER — IOHEXOL 350 MG/ML SOLN
80.0000 mL | Freq: Once | INTRAVENOUS | Status: AC | PRN
  Administered 2014-07-10: 80 mL via INTRAVENOUS

## 2014-07-10 MED ORDER — AMLODIPINE BESYLATE 5 MG PO TABS: 5.0000 mg | ORAL_TABLET | Freq: Two times a day (BID) | ORAL | Status: DC

## 2014-07-10 MED ORDER — GUAIFENESIN ER 600 MG PO TB12: 600.0000 mg | ORAL_TABLET | Freq: Two times a day (BID) | ORAL | Status: DC | PRN

## 2014-07-10 MED ORDER — SODIUM CHLORIDE 0.9 % IV BOLUS (SEPSIS)
500.0000 mL | Freq: Once | INTRAVENOUS | Status: AC
Start: 2014-07-10 — End: 2014-07-10
  Administered 2014-07-10: 500 mL via INTRAVENOUS

## 2014-07-10 MED ORDER — SULFAMETHOXAZOLE-TRIMETHOPRIM 800-160 MG PO TABS: 1.0000 | ORAL_TABLET | Freq: Two times a day (BID) | ORAL | Status: DC

## 2014-07-10 MED ORDER — HYDROCODONE-ACETAMINOPHEN 5-325 MG PO TABS: 1.0000 | ORAL_TABLET | Freq: Four times a day (QID) | ORAL | Status: DC | PRN

## 2014-07-10 NOTE — ED Provider Notes (Signed)
CSN: [id number]     Arrival date & time 07/10/14  1252 History   First MD Initiated Contact with Patient 07/10/14 1501     Chief Complaint  Patient presents with  . Chest Pain  . Shortness of Breath     (Consider location/radiation/quality/duration/timing/severity/associated sxs/prior Treatment) HPI Comments: Cassandra Burns is a 48 y.o. female with a PMHx of HTN, asthma, anemia, menometrorrhagia, psoriasis, DM, obesity, depression, anxiety, and sarcoidosis, who presents to the ED with complaints of gradual onset HA which began ~3 days ago and was initially intermittent but then became constant today. Pain is frontal, 10/10, throbbing, nonradiating, worse with light exposure, improved with rest and Ibuprofen. Denies that this is the worst HA of her life or had a thunderclap onset. Additionally she reports intermittent CP x3-4 days which is currently resolved, intermittent SOB with lying flat, lightheadedness with standing which is currently resolved, cough with yellow sputum, wheezing unrelieved by albuterol, PND, 3 pillow orthopnea, and nausea. She states she had an episode of diaphoresis last night but this has also resolved today. She continues to have chronic myalgias which have been ongoing for years, and are unchanged. Denies fevers, chills, DOE, LE swelling, hemoptysis, abd pain, vomiting, diarrhea, constipation, dysuria, hematuria, vaginal symptoms, arthralgias, paresthesias, weakness, numbness, or focal neuro deficits. She has a hx of sleep apnea, compliant with her CPAP. Denies recent travel or estrogen use. States she has not been taking her antiHTNs in 1 week. Usually takes norvasc 5mg  BID and hyzaar 100-12.5mg  qAM. Also states she takes metformin 1000mg  BID.  Patient is a 48 y.o. female presenting with headaches. The history is provided by the patient. No language interpreter was used.  Headache Pain location:  Frontal Quality: throbbing. Radiates to:  Does not radiate Severity  currently:  10/10 Severity at highest:  10/10 Onset quality:  Gradual Duration:  3 days Timing:  Constant Progression:  Waxing and waning Chronicity:  Recurrent Similar to prior headaches: yes   Relieved by:  Resting in a darkened room and NSAIDs Worsened by:  Light Ineffective treatments:  None tried Associated symptoms: cough, myalgias (intermittent, chronic and unchanged), nausea and photophobia   Associated symptoms: no abdominal pain, no back pain, no blurred vision, no congestion, no diarrhea, no dizziness, no ear pain, no pain, no facial pain, no fever, no focal weakness, no loss of balance, no near-syncope, no neck pain, no neck stiffness, no numbness, no paresthesias (intermittent, now resolved), no seizures, no sinus pressure, no syncope, no tingling, no URI, no visual change, no vomiting and no weakness     Past Medical History  Diagnosis Date  . Hypertension   . Asthma   . Iron deficiency anemia due to chronic blood loss   . Menometrorrhagia   . Psoriasis   . Sleep apnea   . 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 Obesity 12/18/2012  . Depression   . Anxiety   . Bone pain 06/07/2013  . Lymphadenopathy 06/07/2013  . Sarcoidosis 07/26/2013  . Unspecified deficiency anemia 07/26/2013  . Hernia of abdominal cavity 10/25/2013   Past Surgical History  Procedure Laterality Date  . Tubal ligation     Family History  Problem Relation Age of Onset  . Diabetes type II Father   . Asthma Father   . Hypertension Father   . Heart murmur Father   . Alcohol abuse Father   . Drug abuse Father   . Hypertension Mother   . Alcohol abuse Mother   .  Drug abuse Mother    History  Substance Use Topics  . Smoking status: Never Smoker   . Smokeless tobacco: Never Used     Comment: Smokes Marijuana  . Alcohol Use: No   OB History    No data available     Review of Systems  Constitutional: Positive for diaphoresis (one episode, now resolved). Negative for fever.  HENT:  Negative for congestion, ear pain, rhinorrhea and sinus pressure.   Eyes: Positive for photophobia. Negative for blurred vision and pain.  Respiratory: Positive for cough, shortness of breath and wheezing.   Cardiovascular: Positive for chest pain (now resolved). Negative for palpitations, leg swelling, syncope and near-syncope.  Gastrointestinal: Positive for nausea. Negative for vomiting, abdominal pain, diarrhea and constipation.  Genitourinary: Negative for dysuria, hematuria, vaginal bleeding and vaginal discharge.  Musculoskeletal: Positive for myalgias (intermittent, chronic and unchanged). Negative for back pain, joint swelling, arthralgias, gait problem, neck pain and neck stiffness.  Skin: Negative for color change.  Neurological: Positive for light-headedness (intermittent with standing) and headaches. Negative for dizziness, focal weakness, seizures, syncope, weakness, numbness, paresthesias (intermittent, now resolved) and loss of balance.   10 Systems reviewed and are negative for acute change except as noted in the HPI.    Allergies  Review of patient's allergies indicates no known allergies.  Home Medications   Prior to Admission medications   Medication Sig Start Date End Date Taking? Authorizing Provider  amLODipine (NORVASC) 5 MG tablet Take 5 mg by mouth 2 (two) times daily.      Historical Provider, MD  FLUoxetine (PROZAC) 40 MG capsule Take 40 mg by mouth every morning.    Historical Provider, MD  loratadine (CLARITIN) 10 MG tablet Take 10 mg by mouth daily.      Historical Provider, MD  losartan-hydrochlorothiazide (HYZAAR) 100-12.5 MG per tablet Take 1 tablet by mouth every morning.  09/20/13   Historical Provider, MD  meloxicam (MOBIC) 7.5 MG tablet Take 1 tablet (7.5 mg total) by mouth 2 (two) times daily after a meal. 01/28/14   Billy Fischer, MD  metFORMIN (GLUCOPHAGE) 1000 MG tablet Take 1,000 mg by mouth 2 (two) times daily with a meal.    Historical Provider, MD    BP 176/118 mmHg  Pulse 107  Temp(Src) 98.1 F (36.7 C) (Oral)  Ht 4\' 11"  (1.499 m)  Wt 221 lb (100.245 kg)  BMI 44.61 kg/m2  SpO2 100% Physical Exam  Constitutional: She is oriented to person, place, and time. She appears well-developed.  Non-toxic appearance. No distress.  Obese female in NAD but slightly uncomfortable appearing, HTN noted with slight tachycardia which intermittently resolves and then again increases  HENT:  Head: Normocephalic and atraumatic.  Nose: Nose normal.  Mouth/Throat: Uvula is midline, oropharynx is clear and moist and mucous membranes are normal.  Eyes: Conjunctivae and EOM are normal. Pupils are equal, round, and reactive to light. Right eye exhibits no discharge. Left eye exhibits no discharge.  PERRL, EOMI  Neck: Normal range of motion. Neck supple. No spinous process tenderness and no muscular tenderness present. No rigidity. Normal range of motion present.  FROM intact without spinous process or paraspinous muscle TTP, no bony stepoffs or deformities, no muscle spasms. No rigidity or meningeal signs. No bruising or swelling.   Cardiovascular: Regular rhythm, normal heart sounds and intact distal pulses.  Tachycardia present.  Exam reveals no gallop and no friction rub.   No murmur heard. Tachycardic with regular rhythm, nl s1/s2, no m/r/g, distal  pulses intact in all extremities  Pulmonary/Chest: Effort normal and breath sounds normal. No respiratory distress. She has no decreased breath sounds. She has no wheezes. She has no rhonchi. She has no rales. She exhibits no tenderness.  CTAB in all lung fields although body habitus limits adequate auscultation  Abdominal: Soft. Normal appearance and bowel sounds are normal. She exhibits no distension. There is no tenderness. There is no rigidity, no rebound, no guarding, no tenderness at McBurney's point and negative Murphy's sign. A hernia is present. Hernia confirmed positive in the ventral area.  Soft,  NTND, +BS throughout, no r/g/r, neg murphy's, neg mcburney's, ventral hernia palpated, soft without strangulated bowel  Musculoskeletal: Normal range of motion.  MAE x4 Strength 5/5 in all extremities Sensation grossly intact in all extremities Gait nonataxic  Neurological: She is alert and oriented to person, place, and time. She has normal strength. No cranial nerve deficit or sensory deficit. She displays a negative Romberg sign. Coordination and gait normal. GCS eye subscore is 4. GCS verbal subscore is 5. GCS motor subscore is 6.  CN 2-12 grossly intact A&O x4 GCS 15 Sensation and strength intact Gait nontaxic including with tandem walking Coordination with finger-to-nose WNL Neg romberg, neg pronator drift   Skin: Skin is warm, dry and intact. No rash noted.  No pedal edema  Psychiatric: She has a normal mood and affect.  Nursing note and vitals reviewed.   ED Course  Procedures (including critical care time) CRITICAL CARE Performed by: Corine Shelter   Total critical care time: 60  Critical care time was exclusive of separately billable procedures and treating other patients.  Critical care was necessary to treat or prevent imminent or life-threatening deterioration.  Critical care was time spent personally by me on the following activities: development of treatment plan with patient and/or surrogate as well as nursing, discussions with consultants, evaluation of patient's response to treatment, examination of patient, obtaining history from patient or surrogate, ordering and performing treatments and interventions, ordering and review of laboratory studies, ordering and review of radiographic studies, pulse oximetry and re-evaluation of patient's condition.   Labs Review Labs Reviewed  CBC - Abnormal; Notable for the following:    WBC 3.7 (*)    RBC 5.28 (*)    Hemoglobin 11.3 (*)    MCV 70.6 (*)    MCH 21.4 (*)    RDW 16.3 (*)    All other  components within normal limits  BASIC METABOLIC PANEL - Abnormal; Notable for the following:    Sodium 134 (*)    Chloride 95 (*)    Glucose, Bld 256 (*)    Anion gap 17 (*)    All other components within normal limits  PRO B NATRIURETIC PEPTIDE - Abnormal; Notable for the following:    Pro B Natriuretic peptide (BNP) 175.8 (*)    All other components within normal limits  URINALYSIS, ROUTINE W REFLEX MICROSCOPIC - Abnormal; Notable for the following:    Specific Gravity, Urine >1.046 (*)    Protein, ur 30 (*)    Nitrite POSITIVE (*)    All other components within normal limits  D-DIMER, QUANTITATIVE - Abnormal; Notable for the following:    D-Dimer, Quant 0.99 (*)    All other components within normal limits  URINE MICROSCOPIC-ADD ON - Abnormal; Notable for the following:    Squamous Epithelial / LPF MANY (*)    All other components within normal limits  URINE CULTURE  I-STAT TROPOININ, ED  Randolm Idol, ED    Imaging Review Ct Head Wo Contrast  07/10/2014   CLINICAL DATA:  PERSISTENT  H.A FOR 3 DAYS WITH HX OF HTN  EXAM: CT HEAD WITHOUT CONTRAST  TECHNIQUE: Contiguous axial images were obtained from the base of the skull through the vertex without intravenous contrast.  COMPARISON:  11/07/2011  FINDINGS: There is an old lacunar infarct involving the right external capsule. There is no intra or extra-axial fluid collection or mass lesion. The basilar cisterns and ventricles have a normal appearance. There is no CT evidence for acute infarction or hemorrhage. Bone windows are unremarkable.  IMPRESSION: 1. 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 2.  No evidence for acute intracranial abnormality.   Electronically Signed   By: Shon Hale M.D.   On: 07/10/2014 16:42   Ct Angio Chest Pe W/cm &/or Wo Cm  07/10/2014   CLINICAL DATA:  Chest pain and shortness of breath for the past 3-5 days. Headache and nausea. History of hypertension and diabetes, sarcoidosis.  EXAM:  CT ANGIOGRAPHY CHEST WITH CONTRAST  TECHNIQUE: Multidetector CT imaging of the chest was performed using the standard protocol during bolus administration of intravenous contrast. Multiplanar CT image reconstructions and MIPs were obtained to evaluate the vascular anatomy.  CONTRAST:  67mL OMNIPAQUE IOHEXOL 350 MG/ML SOLN  COMPARISON:  CT of the chest on 06/18/2013  FINDINGS: Heart: The heart is normal in appearance. No significant coronary artery calcifications.  Vascular structures: The pulmonary arteries are well opacified. There is no acute pulmonary embolus. There is atherosclerotic calcification of the aortic arch. No aneurysm or dissection. There and arch anatomy.  Mediastinum/thyroid: There small prevascular lymph nodes. Left prevascular lymph node is 1.4 cm. Subcarinal node is 1.4 cm. Small node anterior to the superior vena cava is 1.0 cm. Nodes appear similar to prior study.  Lungs/Airways: Airways are patent. There are mild hazy densities within the lungs bilaterally. Findings may be related to the patient's known sarcoidosis. No pleural effusions or focal consolidations are identified.  Upper abdomen: Unremarkable.  Chest wall/osseous structures: Degenerative changes in the spine  Review of the MIP images confirms the above findings.  IMPRESSION: 1. Technically adequate exam showing no pulmonary embolus. 2. Stable appearance of small mediastinal lymph nodes. 3. Patchy appearance of the lung parenchyma likely related to the patient's sarcoidosis. Acute superimposed infectious process would also be a consideration.   Electronically Signed   By: Shon Hale M.D.   On: 07/10/2014 17:52     EKG Interpretation   Date/Time:  Sunday July 10 2014 12:58:23 EST Ventricular Rate:  107 PR Interval:  134 QRS Duration: 72 QT Interval:  346 QTC Calculation: 461 R Axis:   33 Text Interpretation:  Sinus tachycardia Otherwise normal ECG Since last  tracing rate faster Confirmed by [facility]  MD, ELLIOTT  [phone]) on 07/10/2014  3:54:15 PM      MDM   Final diagnoses:  Chest pain  Headache  HTN (hypertension)  Hypertensive urgency  SOB (shortness of breath)  Cough  Sarcoidosis  UTI (lower urinary tract infection)  Hyperglycemia    48 y.o. female with HTN urgency, having HA and intermittent CP/SOB. Trop neg, CBC showing baseline anemia and leukopenia, BMP showing Na 134, Cl 95, gluc 256, AG 17 with normal bicarb therefore will check urine for ketones. BNP 175.8. EKG showing sinus tachy. Will obtain dimer given low risk, if + will proceed with CTA. Will obtain CT head and CXR to r/o end organ  damage. Will give labetalol to treat HTN urgency, decreasing MAP by 25% in 1hr (current MAP 137, will decrease to 102). Will then assess if HA is improving. Will give small bolus of fluids to replete NaCl. Will reassess shortly.  5:03 PM D-dimer 0.99, therefore will obtain CTA for PE r/o. CT head neg for acute abnormality. HA improving. BP now 159/115, MAP 130, HR 86 after Labetalol 60mg  total. Will attempt one more 20mg  dose and then proceed with hydralazine. Still awaiting U/A.   6:03 PM CTA chest neg for PE. Shows stable mediastinal LNs c/w sarcoid. HR 91. BP 158/90 (MAP 113) will give norvasc dose. HA improved but still slightly present. SOB still bothersome. Will give albuterol and small dose morphine. Will hold off on prednisone given her diabetes. Will get second trop.  6:58 PM U/A showing +nitrites, many squamous, rare bacteria, and neg leuks. Will treat for UTI. No ketones, doubt DKA. Repeat troponin not performed yet, will inquire with nursing. Upon reassessment, neb treatment currently ongoing, but BP noted to be 209/135. Will give hydralazine. May need to admit for her persistent HTN urgency. HA slightly improved. Will reassess after nebs.  7:27 PM Upon recheck, prior to hydralazine being given, BP down to 166/94. Cancelled hydralazine. Pt's HA is improved, states she doesn't have anymore  CP unless she coughs. After nebs, her SOB feels better, but interestingly her lung sounds now have a slight wheeze. Will repeat neb and ambulate. Pt states she's not allowed to take prednisone, therefore will not give this. This appears to be allergic vs viral URI. Discussed that if ambulatory without desats, and symptoms improved, can d/c home after second trop. Will give slight dose of vicodin for pain and reassess shortly.  8:43 PM Repeat trop neg (not crossing over, verbally verified with lab). Lung sounds improved after second neb. HA improving, SOB improving. BP continues to be maintained at 160s-180s/90s, discussed that she needs hyzaar but this isn't on formulary here. Ambulatory without desats, and pt feels overall improvement, feels safe going home with inhalers, mucinex, claritin, bactrim for UTI, and some pain meds to help with her pain, as well as refills on BP meds and following up with her PCP in 3-5 days. Discussed importance of compliance on BP meds. I explained the diagnosis and have given explicit precautions to return to the ER including for any other new or worsening symptoms. The patient understands and accepts the medical plan as it's been dictated and I have answered their questions. Discharge instructions concerning home care and prescriptions have been given. The patient is STABLE and is discharged to home in good condition.   BP 185/95 mmHg  Pulse 97  Temp(Src) 98.1 F (36.7 C) (Oral)  Resp 19  Ht 4\' 11"  (1.499 m)  Wt 221 lb (100.245 kg)  BMI 44.61 kg/m2  SpO2 100%  Meds ordered this encounter  Medications  . labetalol (NORMODYNE,TRANDATE) injection 20 mg    Sig:     Administer over 2 minutes, slow IV infusion  . sodium chloride 0.9 % bolus 500 mL    Sig:   . labetalol (NORMODYNE,TRANDATE) injection 20 mg    Sig:     Repeat doses of 20mg  q51min to reach a goal MAP of 102  . iohexol (OMNIPAQUE) 350 MG/ML injection 80 mL    Sig:   . morphine 4 MG/ML injection 4 mg     Sig:   . albuterol (PROVENTIL) (2.5 MG/3ML) 0.083% nebulizer solution 5 mg  Sig:   . amLODipine (NORVASC) tablet 10 mg    Sig:   . albuterol (PROVENTIL) (2.5 MG/3ML) 0.083% nebulizer solution 5 mg    Sig:   . ipratropium (ATROVENT) nebulizer solution 0.5 mg    Sig:   . HYDROcodone-acetaminophen (NORCO/VICODIN) 5-325 MG per tablet 1 tablet    Sig:   . loratadine (CLARITIN) 10 MG tablet    Sig: Take 1 tablet (10 mg total) by mouth daily.    Dispense:  30 tablet    Refill:  0    Order Specific Question:  Supervising Provider    Answer:  Noemi Chapel D [[id number]]  . naproxen (NAPROSYN) 500 MG tablet    Sig: Take 1 tablet (500 mg total) by mouth 2 (two) times daily as needed for mild pain, moderate pain or headache (TAKE WITH MEALS.).    Dispense:  20 tablet    Refill:  0    Order Specific Question:  Supervising Provider    Answer:  Noemi Chapel D [[id number]]  . HYDROcodone-acetaminophen (NORCO) 5-325 MG per tablet    Sig: Take 1-2 tablets by mouth every 6 (six) hours as needed for severe pain.    Dispense:  6 tablet    Refill:  0    Order Specific Question:  Supervising Provider    Answer:  Noemi Chapel D [[id number]]  . sulfamethoxazole-trimethoprim (BACTRIM DS,SEPTRA DS) 800-160 MG per tablet    Sig: Take 1 tablet by mouth 2 (two) times daily.    Dispense:  14 tablet    Refill:  0    Order Specific Question:  Supervising Provider    Answer:  Noemi Chapel D [[id number]]  . guaiFENesin (MUCINEX) 600 MG 12 hr tablet    Sig: Take 1 tablet (600 mg total) by mouth 2 (two) times daily as needed for cough or to loosen phlegm.    Dispense:  10 tablet    Refill:  0    Order Specific Question:  Supervising Provider    Answer:  Noemi Chapel D [[id number]]  . albuterol (PROVENTIL HFA;VENTOLIN HFA) 108 (90 BASE) MCG/ACT inhaler    Sig: Inhale 2 puffs into the lungs every 2 (two) hours as needed for wheezing or shortness of breath (cough).    Dispense:  1 Inhaler    Refill:  0    Order Specific Question:   Supervising Provider    Answer:  Noemi Chapel D [[id number]]  . losartan-hydrochlorothiazide (HYZAAR) 100-12.5 MG per tablet    Sig: Take 1 tablet by mouth daily.    Dispense:  30 tablet    Refill:  1    Order Specific Question:  Supervising Provider    Answer:  Noemi Chapel D [[id number]]  . amLODipine (NORVASC) 5 MG tablet    Sig: Take 1 tablet (5 mg total) by mouth 2 (two) times daily.    Dispense:  60 tablet    Refill:  1    Order Specific Question:  Supervising Provider    Answer:  Noemi Chapel D [[id number]]     Matheo Rathbone Strupp Camprubi-Soms, PA-C 07/10/14 2050  ADDENDUM: when pt was getting ready for discharge, she felt diaphoretic and nauseated. Food was given which improved her symptoms, but upon reassessment, her oxygen saturations are lower and although she continues to deny any pain or SOB, she doesn't appear well enough for discharge. At this time will proceed with admission for her worsening respiratory status. Will obtain ABG and place on oxygen. Of note, pt uses home bipap.  At 10:31 PM Dr. Alcario Drought returning page and agrees to admit pt to tele bed. Please see his dictation for further documentation of care.  Patty Sermons [place], PA-C 07/10/14 [place], MD 07/11/14 1434

## 2014-07-10 NOTE — Discharge Instructions (Signed)
Continue to stay well-hydrated. Continue to alternate between naprosyn and norco as needed for pain or fever or headaches. Use Mucinex for cough suppression/expectoration of mucus. May consider over-the-counter Benadryl or other antihistamine (claritin) to decrease secretions and for watery itchy eyes. Use inhaler as directed, as needed for cough/chest congestion. You must be compliant on your blood pressure medications in order to stay healthy! Take bactrim as directed for your urinary tract infection. Followup with your primary care doctor in 5-7 days for recheck of ongoing symptoms. Return to emergency department for emergent changing or worsening of symptoms.  Chest Pain (Nonspecific) It is often hard to give a diagnosis for the cause of chest pain. There is always a chance that your pain could be related to something serious, such as a heart attack or a blood clot in the lungs. You need to follow up with your doctor. HOME CARE  If antibiotic medicine was given, take it as directed by your doctor. Finish the medicine even if you start to feel better.  For the next few days, avoid activities that bring on chest pain. Continue physical activities as told by your doctor.  Do not use any tobacco products. This includes cigarettes, chewing tobacco, and e-cigarettes.  Avoid drinking alcohol.  Only take medicine as told by your doctor.  Follow your doctor's suggestions for more testing if your chest pain does not go away.  Keep all doctor visits you made. GET HELP IF:  Your chest pain does not go away, even after treatment.  You have a rash with blisters on your chest.  You have a fever. GET HELP RIGHT AWAY IF:   You have more pain or pain that spreads to your arm, neck, jaw, back, or belly (abdomen).  You have shortness of breath.  You cough more than usual or cough up blood.  You have very bad back or belly pain.  You feel sick to your stomach (nauseous) or throw up (vomit).  You  have very bad weakness.  You pass out (faint).  You have chills. This is an emergency. Do not wait to see if the problems will go away. Call your local emergency services (911 in [place] Do not drive yourself to the hospital. MAKE SURE YOU:   Understand these instructions.  Will watch your condition.  Will get help right away if you are not doing well or get worse. Document Released: 01/01/2008 Document Revised: 07/20/2013 Document Reviewed: 01/01/2008 [facility] Patient Information 2015 [place], [place]. This information is not intended to replace advice given to you by your health care provider. Make sure you discuss any questions you have with your health care provider.  Hypertension Hypertension is another name for high blood pressure. High blood pressure forces your heart to work harder to pump blood. A blood pressure reading has two numbers, which includes a higher number over a lower number (example: 110/72). HOME CARE   Have your blood pressure rechecked by your doctor.  Only take medicine as told by your doctor. Follow the directions carefully. The medicine does not work as well if you skip doses. Skipping doses also puts you at risk for problems.  Do not smoke.  Monitor your blood pressure at home as told by your doctor. GET HELP IF:  You think you are having a reaction to the medicine you are taking.  You have repeat headaches or feel dizzy.  You have puffiness (swelling) in your ankles.  You have trouble with your vision. GET HELP RIGHT AWAY  IF:   You get a very bad headache and are confused.  You feel weak, numb, or faint.  You get chest or belly (abdominal) pain.  You throw up (vomit).  You cannot breathe very well. MAKE SURE YOU:   Understand these instructions.  Will watch your condition.  Will get help right away if you are not doing well or get worse. Document Released: 01/01/2008 Document Revised: 07/20/2013 Document Reviewed: 05/07/2013 [facility]  Patient Information 2015 [place], [place]. This information is not intended to replace advice given to you by your health care provider. Make sure you discuss any questions you have with your health care provider.  Managing Your High Blood Pressure Blood pressure is a measurement of how forceful your blood is pressing against the walls of the arteries. Arteries are muscular tubes within the circulatory system. Blood pressure does not stay the same. Blood pressure rises when you are active, excited, or nervous; and it lowers during sleep and relaxation. If the numbers measuring your blood pressure stay above normal most of the time, you are at risk for health problems. High blood pressure (hypertension) is a long-term (chronic) condition in which blood pressure is elevated. A blood pressure reading is recorded as two numbers, such as 120 over 80 (or 120/80). The first, higher number is called the systolic pressure. It is a measure of the pressure in your arteries as the heart beats. The second, lower number is called the diastolic pressure. It is a measure of the pressure in your arteries as the heart relaxes between beats.  Keeping your blood pressure in a normal range is important to your overall health and prevention of health problems, such as heart disease and stroke. When your blood pressure is uncontrolled, your heart has to work harder than normal. High blood pressure is a very common condition in adults because blood pressure tends to rise with age. Men and women are equally likely to have hypertension but at different times in life. Before age 36, men are more likely to have hypertension. After 49 years of age, women are more likely to have it. Hypertension is especially common in African Americans. This condition often has no signs or symptoms. The cause of the condition is usually not known. Your caregiver can help you come up with a plan to keep your blood pressure in a normal, healthy range. BLOOD  PRESSURE STAGES Blood pressure is classified into four stages: normal, prehypertension, stage 1, and stage 2. Your blood pressure reading will be used to determine what type of treatment, if any, is necessary. Appropriate treatment options are tied to these four stages:  Normal  Systolic pressure (mm Hg): below 120.  Diastolic pressure (mm Hg): below 80. Prehypertension  Systolic pressure (mm Hg): 120 to 139.  Diastolic pressure (mm Hg): 80 to 89. Stage1  Systolic pressure (mm Hg): 140 to 159.  Diastolic pressure (mm Hg): 90 to 99. Stage2  Systolic pressure (mm Hg): 160 or above.  Diastolic pressure (mm Hg): 100 or above. RISKS RELATED TO HIGH BLOOD PRESSURE Managing your blood pressure is an important responsibility. Uncontrolled high blood pressure can lead to:  A heart attack.  A stroke.  A weakened blood vessel (aneurysm).  Heart failure.  Kidney damage.  Eye damage.  Metabolic syndrome.  Memory and concentration problems. HOW TO MANAGE YOUR BLOOD PRESSURE Blood pressure can be managed effectively with lifestyle changes and medicines (if needed). Your caregiver will help you come up with a plan to bring your  blood pressure within a normal range. Your plan should include the following: Education  Read all information provided by your caregivers about how to control blood pressure.  Educate yourself on the latest guidelines and treatment recommendations. New research is always being done to further define the risks and treatments for high blood pressure. Lifestylechanges  Control your weight.  Avoid smoking.  Stay physically active.  Reduce the amount of salt in your diet.  Reduce stress.  Control any chronic conditions, such as high cholesterol or diabetes.  Reduce your alcohol intake. Medicines  Several medicines (antihypertensive medicines) are available, if needed, to bring blood pressure within a normal range. Communication  Review all the  medicines you take with your caregiver because there may be side effects or interactions.  Talk with your caregiver about your diet, exercise habits, and other lifestyle factors that may be contributing to high blood pressure.  See your caregiver regularly. Your caregiver can help you create and adjust your plan for managing high blood pressure. RECOMMENDATIONS FOR TREATMENT AND FOLLOW-UP  The following recommendations are based on current guidelines for managing high blood pressure in nonpregnant adults. Use these recommendations to identify the proper follow-up period or treatment option based on your blood pressure reading. You can discuss these options with your caregiver.  Systolic pressure of 379 to 024 or diastolic pressure of 80 to 89: Follow up with your caregiver as directed.  Systolic pressure of 097 to 353 or diastolic pressure of 90 to 100: Follow up with your caregiver within 2 months.  Systolic pressure above 299 or diastolic pressure above 242: Follow up with your caregiver within 1 month.  Systolic pressure above 683 or diastolic pressure above 419: Consider antihypertensive therapy; follow up with your caregiver within 1 week.  Systolic pressure above 622 or diastolic pressure above 297: Begin antihypertensive therapy; follow up with your caregiver within 1 week. Document Released: 04/08/2012 Document Reviewed: 04/08/2012 [facility] Patient Information 2015 [place]. This information is not intended to replace advice given to you by your health care provider. Make sure you discuss any questions you have with your health care provider.  Shortness of Breath Shortness of breath means you have trouble breathing. Shortness of breath needs medical care right away. HOME CARE   Do not smoke.  Avoid being around chemicals or things (paint fumes, dust) that may bother your breathing.  Rest as needed. Slowly begin your normal activities.  Only take medicines as told by your  doctor.  Keep all doctor visits as told. GET HELP RIGHT AWAY IF:   Your shortness of breath gets worse.  You feel lightheaded, pass out (faint), or have a cough that is not helped by medicine.  You cough up blood.  You have pain with breathing.  You have pain in your chest, arms, shoulders, or belly (abdomen).  You have a fever.  You cannot walk up stairs or exercise the way you normally do.  You do not get better in the time expected.  You have a hard time doing normal activities even with rest.  You have problems with your medicines.  You have any new symptoms. MAKE SURE YOU:  Understand these instructions.  Will watch your condition.  Will get help right away if you are not doing well or get worse. Document Released: 01/01/2008 Document Revised: 07/20/2013 Document Reviewed: 09/30/2011 [facility] Patient Information 2015 [place], [place]. This information is not intended to replace advice given to you by your health care provider. Make sure  you discuss any questions you have with your health care provider.  Urinary Tract Infection A urinary tract infection (UTI) can occur any place along the urinary tract. The tract includes the kidneys, ureters, bladder, and urethra. A type of germ called bacteria often causes a UTI. UTIs are often helped with antibiotic medicine.  HOME CARE   If given, take antibiotics as told by your doctor. Finish them even if you start to feel better.  Drink enough fluids to keep your pee (urine) clear or pale yellow.  Avoid tea, drinks with caffeine, and bubbly (carbonated) drinks.  Pee often. Avoid holding your pee in for a long time.  Pee before and after having sex (intercourse).  Wipe from front to back after you poop (bowel movement) if you are a woman. Use each tissue only once. GET HELP RIGHT AWAY IF:   You have back pain.  You have lower belly (abdominal) pain.  You have chills.  You feel sick to your stomach (nauseous).  You  throw up (vomit).  Your burning or discomfort with peeing does not go away.  You have a fever.  Your symptoms are not better in 3 days. MAKE SURE YOU:   Understand these instructions.  Will watch your condition.  Will get help right away if you are not doing well or get worse. Document Released: 01/01/2008 Document Revised: 04/08/2012 Document Reviewed: 02/13/2012 [facility] Patient Information 2015 [place], [place]. This information is not intended to replace advice given to you by your health care provider. Make sure you discuss any questions you have with your health care provider.   Metformin and X-ray Contrast Studies For some X-ray exams, a contrast dye is used. Contrast dye is a type of medicine used to make the X-ray image clearer. The contrast dye is given to the patient through a vein (intravenously). If you need to have this type of X-ray exam and you take a medication called metformin, your caregiver may have you stop taking metformin before the exam.  LACTIC ACIDOSIS In rare cases, a serious medical condition called lactic acidosis can develop in people who take metformin and receive contrast dye. The following conditions can increase the risk of this complication:   Kidney failure.  Liver problems.  Certain types of heart problems such as:  Heart failure.  Heart attack.  Heart infection.  Heart valve problems.  Alcohol abuse. If left untreated, lactic acidosis can lead to coma.  SYMPTOMS OF LACTIC ACIDOSIS Symptoms of lactic acidosis can include:  Rapid breathing (hyperventilation).  Neurologic symptoms such as:  Headaches.  Confusion.  Dizziness.  Excessive sweating.  Feeling sick to your stomach (nauseous) or throwing up (vomiting). AFTER THE X-RAY EXAM  Stay well-hydrated. Drink fluids as instructed by your caregiver.  If you have a risk of developing lactic acidosis, blood tests may be done to make sure your kidney function is  okay.  Metformin is usually stopped for 48 hours after the X-ray exam. Ask your caregiver when you can start taking metformin again. SEEK MEDICAL CARE IF:   You have shortness of breath or difficulty breathing.  You develop a headache that does not go away.  You have nausea or vomiting.  You urinate more than normal.  You develop a skin rash and have:  Redness.  Swelling.  Itching. Document Released: 07/03/2009 Document Revised: 10/07/2011 Document Reviewed: 07/03/2009 [facility] Patient Information 2015 [place], [place]. This information is not intended to replace advice given to you by your health care provider. Make  sure you discuss any questions you have with your health care provider.

## 2014-07-10 NOTE — ED Notes (Signed)
Pt diaphoretic and nauseous at discharge. Edp informed. Food given

## 2014-07-10 NOTE — ED Notes (Signed)
Dr. Gardner at bedside 

## 2014-07-10 NOTE — ED Notes (Signed)
hospitalist at the bedside 

## 2014-07-10 NOTE — ED Notes (Signed)
Pt with spo2 90 edp aware pt. DC held

## 2014-07-10 NOTE — ED Notes (Signed)
Pt states she has been having CP " for a few days" and SOB x4 days. Pt states she has has problem laying down. PT states she has to sleep on 3 pillows Pt has hx of sleep apea, Pt states she has a cough and has been coughing up "yellow thick " sputum.

## 2014-07-10 NOTE — ED Notes (Signed)
Pt states she has not taken her BP medication.

## 2014-07-10 NOTE — ED Notes (Signed)
Pt started at 98% when sitting at the end of her bed. Upon ambulation Pt pulse ox dropped to 95% and stayed at 95% until her return to her room where she dropped to 91% before getting back into bed. Pt reported feeling a little dizzy her pulse was between 100 and 107.

## 2014-07-11 ENCOUNTER — Encounter (HOSPITAL_COMMUNITY): Payer: Self-pay | Admitting: *Deleted

## 2014-07-11 DIAGNOSIS — I10 Essential (primary) hypertension: Secondary | ICD-10-CM | POA: Insufficient documentation

## 2014-07-11 DIAGNOSIS — I369 Nonrheumatic tricuspid valve disorder, unspecified: Secondary | ICD-10-CM

## 2014-07-11 DIAGNOSIS — J811 Chronic pulmonary edema: Secondary | ICD-10-CM | POA: Insufficient documentation

## 2014-07-11 DIAGNOSIS — J810 Acute pulmonary edema: Secondary | ICD-10-CM

## 2014-07-11 DIAGNOSIS — I161 Hypertensive emergency: Secondary | ICD-10-CM | POA: Diagnosis present

## 2014-07-11 DIAGNOSIS — R0602 Shortness of breath: Secondary | ICD-10-CM | POA: Insufficient documentation

## 2014-07-11 LAB — BASIC METABOLIC PANEL
Anion gap: 15 (ref 5–15)
BUN: 10 mg/dL (ref 6–23)
CO2: 26 mEq/L (ref 19–32)
Calcium: 9.7 mg/dL (ref 8.4–10.5)
Chloride: 94 mEq/L — ABNORMAL LOW (ref 96–112)
Creatinine, Ser: 0.64 mg/dL (ref 0.50–1.10)
GFR calc Af Amer: >90 mL/min (ref >90)
GFR calc non Af Amer: >90 mL/min (ref >90)
Glucose, Bld: 357 mg/dL — ABNORMAL HIGH (ref 70–99)
POTASSIUM: 4.3 meq/L (ref 3.7–5.3)
SODIUM: 135 meq/L — AB (ref 137–147)

## 2014-07-11 LAB — PHOSPHORUS: Phosphorus: 4 mg/dL (ref 2.3–4.6)

## 2014-07-11 LAB — CBC
HCT: 37.1 % (ref 36.0–46.0)
Hemoglobin: 11.2 g/dL — ABNORMAL LOW (ref 12.0–15.0)
MCH: 21.4 pg — ABNORMAL LOW (ref 26.0–34.0)
MCHC: 30.2 g/dL (ref 30.0–36.0)
MCV: 70.9 fL — ABNORMAL LOW (ref 78.0–100.0)
Platelets: 215 10*3/uL (ref 150–400)
RBC: 5.23 MIL/uL — AB (ref 3.87–5.11)
RDW: 16.2 % — ABNORMAL HIGH (ref 11.5–15.5)
WBC: 4.7 10*3/uL (ref 4.0–10.5)

## 2014-07-11 LAB — GLUCOSE, CAPILLARY
GLUCOSE-CAPILLARY: 331 mg/dL — AB (ref 70–99)
GLUCOSE-CAPILLARY: 304 mg/dL — AB (ref 70–99)
GLUCOSE-CAPILLARY: 171 mg/dL — AB (ref 70–99)
GLUCOSE-CAPILLARY: 265 mg/dL — AB (ref 70–99)
Glucose-Capillary: 338 mg/dL — ABNORMAL HIGH (ref 70–99)
Glucose-Capillary: 339 mg/dL — ABNORMAL HIGH (ref 70–99)
Glucose-Capillary: 175 mg/dL — ABNORMAL HIGH (ref 70–99)
Glucose-Capillary: 287 mg/dL — ABNORMAL HIGH (ref 70–99)

## 2014-07-11 LAB — MRSA PCR SCREENING: MRSA by PCR: NEGATIVE

## 2014-07-11 LAB — MAGNESIUM: MAGNESIUM: 1.6 mg/dL (ref 1.5–2.5)

## 2014-07-11 MED ORDER — HYDROCHLOROTHIAZIDE 25 MG PO TABS
25.0000 mg | ORAL_TABLET | Freq: Every day | ORAL | Status: DC
  Administered 2014-07-11 – 2014-07-13 (×3): 25 mg via ORAL
  Filled 2014-07-11 (×3): qty 1

## 2014-07-11 MED ORDER — ONDANSETRON HCL 4 MG/2ML IJ SOLN: 4.0000 mg | Freq: Four times a day (QID) | INTRAMUSCULAR | Status: DC | PRN

## 2014-07-11 MED ORDER — CETYLPYRIDINIUM CHLORIDE 0.05 % MT LIQD
7.0000 mL | Freq: Two times a day (BID) | OROMUCOSAL | Status: DC
  Administered 2014-07-11 – 2014-07-13 (×5): 7 mL via OROMUCOSAL

## 2014-07-11 MED ORDER — LOSARTAN POTASSIUM 50 MG PO TABS
100.0000 mg | ORAL_TABLET | Freq: Every day | ORAL | Status: DC
  Administered 2014-07-11 – 2014-07-13 (×4): 100 mg via ORAL
  Filled 2014-07-11 (×4): qty 2

## 2014-07-11 MED ORDER — ASPIRIN 81 MG PO CHEW
324.0000 mg | CHEWABLE_TABLET | ORAL | Status: AC
  Administered 2014-07-11: 324 mg via ORAL
  Filled 2014-07-11: qty 4

## 2014-07-11 MED ORDER — INSULIN GLARGINE 100 UNIT/ML ~~LOC~~ SOLN
10.0000 [IU] | Freq: Every day | SUBCUTANEOUS | Status: DC
  Administered 2014-07-11: 10 [IU] via SUBCUTANEOUS
  Filled 2014-07-11 (×2): qty 0.1

## 2014-07-11 MED ORDER — ACETAMINOPHEN 325 MG PO TABS
650.0000 mg | ORAL_TABLET | ORAL | Status: DC | PRN
  Administered 2014-07-11 – 2014-07-12 (×3): 650 mg via ORAL
  Filled 2014-07-11 (×3): qty 2

## 2014-07-11 MED ORDER — HEPARIN SODIUM (PORCINE) 5000 UNIT/ML IJ SOLN
5000.0000 [IU] | Freq: Three times a day (TID) | INTRAMUSCULAR | Status: DC
  Administered 2014-07-11 – 2014-07-13 (×8): 5000 [IU] via SUBCUTANEOUS
  Filled 2014-07-11 (×11): qty 1

## 2014-07-11 MED ORDER — INSULIN ASPART 100 UNIT/ML ~~LOC~~ SOLN
4.0000 [IU] | Freq: Three times a day (TID) | SUBCUTANEOUS | Status: DC
  Administered 2014-07-11 – 2014-07-13 (×7): 4 [IU] via SUBCUTANEOUS

## 2014-07-11 MED ORDER — HYDROCHLOROTHIAZIDE 12.5 MG PO CAPS
12.5000 mg | ORAL_CAPSULE | Freq: Every day | ORAL | Status: DC
  Administered 2014-07-11: 12.5 mg via ORAL
  Filled 2014-07-11 (×2): qty 1

## 2014-07-11 MED ORDER — INSULIN ASPART 100 UNIT/ML ~~LOC~~ SOLN
0.0000 [IU] | Freq: Three times a day (TID) | SUBCUTANEOUS | Status: DC
  Administered 2014-07-11: 15 [IU] via SUBCUTANEOUS
  Administered 2014-07-11 (×2): 4 [IU] via SUBCUTANEOUS
  Administered 2014-07-12: 11 [IU] via SUBCUTANEOUS
  Administered 2014-07-12 (×2): 7 [IU] via SUBCUTANEOUS
  Administered 2014-07-13 (×2): 11 [IU] via SUBCUTANEOUS

## 2014-07-11 MED ORDER — MAGNESIUM SULFATE 2 GM/50ML IV SOLN
2.0000 g | Freq: Once | INTRAVENOUS | Status: AC
  Administered 2014-07-11: 2 g via INTRAVENOUS
  Filled 2014-07-11: qty 50

## 2014-07-11 MED ORDER — INSULIN ASPART 100 UNIT/ML ~~LOC~~ SOLN
0.0000 [IU] | Freq: Every day | SUBCUTANEOUS | Status: DC
  Administered 2014-07-11: 3 [IU] via SUBCUTANEOUS
  Administered 2014-07-11: 4 [IU] via SUBCUTANEOUS

## 2014-07-11 MED ORDER — ASPIRIN 300 MG RE SUPP: 300.0000 mg | RECTAL | Status: AC

## 2014-07-11 MED ORDER — AMLODIPINE BESYLATE 10 MG PO TABS
10.0000 mg | ORAL_TABLET | Freq: Every day | ORAL | Status: DC
  Administered 2014-07-11 – 2014-07-13 (×4): 10 mg via ORAL
  Filled 2014-07-11 (×4): qty 1

## 2014-07-11 MED ORDER — NICARDIPINE HCL IN NACL 20-0.86 MG/200ML-% IV SOLN
3.0000 mg/h | INTRAVENOUS | Status: DC
  Administered 2014-07-11 (×3): 3 mg/h via INTRAVENOUS
  Administered 2014-07-11: 7.5 mg/h via INTRAVENOUS
  Administered 2014-07-12 (×2): 3 mg/h via INTRAVENOUS
  Filled 2014-07-11 (×7): qty 200

## 2014-07-11 MED ORDER — SODIUM CHLORIDE 0.9 % IV SOLN: 250.0000 mL | INTRAVENOUS | Status: DC | PRN

## 2014-07-11 NOTE — H&P (Signed)
PULMONARY / CRITICAL CARE MEDICINE HISTORY AND PHYSICAL EXAMINATION   Name: Cassandra Burns MRN: [id number] DOB: March 09, 1966    ADMISSION DATE:  07/10/2014  PRIMARY SERVICE: PCCM  CHIEF COMPLAINT:  Headache  BRIEF PATIENT DESCRIPTION: 48 y/o woman p/w HTN emergency  SIGNIFICANT EVENTS / STUDIES:  Maintained on nicardipine gtt 12/13 CT chest >>>sarcoid 12/13 ct head>>>neg acute  LINES / TUBES: PIV x2   CULTURES: None  ANTIBIOTICS: None  SUBJECTIVE: nicardipine still required  VITAL SIGNS: Temp:  [97.4 F (36.3 C)-98.2 F (36.8 C)] 97.4 F (36.3 C) (12/14 0849) Pulse Rate:  [87-109] 99 (12/14 0800) Resp:  [2-27] 16 (12/14 0800) BP: (132-223)/(66-135) 162/95 mmHg (12/14 0800) SpO2:  [89 %-100 %] 96 % (12/14 0800) Weight:  [100.245 kg (221 lb)-101.1 kg (222 lb 14.2 oz)] 101.1 kg (222 lb 14.2 oz) (12/14 0112) HEMODYNAMICS:   VENTILATOR SETTINGS:   INTAKE / OUTPUT: Intake/Output      12/13 0701 - 12/14 0700 12/14 0701 - 12/15 0700   P.O. 120    I.V. (mL/kg) 335.8 (3.3) 40 (0.4)   Total Intake(mL/kg) 455.8 (4.5) 40 (0.4)   Urine (mL/kg/hr) 1775    Total Output 1775     Net -1319.2 +40          PHYSICAL EXAMINATION: General:  Obese woman, no distress Neuro: alert, follows commands, nonofocal HEENT:  MMM Neck: Very obese w/ redundant tissue Cardiovascular:  RRR s1 s2  Lungs:  CTA distant Abdomen:  Obese, bs wnl, no r/g Musculoskeletal:  Intact Skin:  Intact  LABS:  CBC  Recent Labs Lab 07/10/14 1320 07/11/14 0215  WBC 3.7* 4.7  HGB 11.3* 11.2*  HCT 37.3 37.1  PLT 203 215   Coag's No results for input(s): APTT, INR in the last 168 hours. BMET  Recent Labs Lab 07/10/14 1320 07/11/14 0215  NA 134* 135*  K 3.9 4.3  CL 95* 94*  CO2 22 26  BUN 10 10  CREATININE 0.63 0.64  GLUCOSE 256* 357*   Electrolytes  Recent Labs Lab 07/10/14 1320 07/11/14 0215  CALCIUM 9.7 9.7  MG  --  1.6  PHOS  --  4.0   Sepsis Markers No results  for input(s): LATICACIDVEN, PROCALCITON, O2SATVEN in the last 168 hours. ABG No results for input(s): PHART, PCO2ART, PO2ART in the last 168 hours. Liver Enzymes No results for input(s): AST, ALT, ALKPHOS, BILITOT, ALBUMIN in the last 168 hours. Cardiac Enzymes  Recent Labs Lab 07/10/14 1320  PROBNP 175.8*   Glucose  Recent Labs Lab 07/10/14 2112 07/11/14 0027 07/11/14 0132 07/11/14 0332  GLUCAP 235* 338* 331* 304*    Imaging Ct Head Wo Contrast  07/10/2014   CLINICAL DATA:  PERSISTENT  H.A FOR 3 DAYS WITH HX OF HTN  EXAM: CT HEAD WITHOUT CONTRAST  TECHNIQUE: Contiguous axial images were obtained from the base of the skull through the vertex without intravenous contrast.  COMPARISON:  11/07/2011  FINDINGS: There is an old lacunar infarct involving the right external capsule. There is no intra or extra-axial fluid collection or mass lesion. The basilar cisterns and ventricles have a normal appearance. There is no CT evidence for acute infarction or hemorrhage. Bone windows are unremarkable.  IMPRESSION: 1. 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 2.  No evidence for acute intracranial abnormality.   Electronically Signed   By: Shon Hale M.D.   On: 07/10/2014 16:42   Ct Angio Chest Pe W/cm &/or Wo Cm  07/10/2014   CLINICAL  DATA:  Chest pain and shortness of breath for the past 3-5 days. Headache and nausea. History of hypertension and diabetes, sarcoidosis.  EXAM: CT ANGIOGRAPHY CHEST WITH CONTRAST  TECHNIQUE: Multidetector CT imaging of the chest was performed using the standard protocol during bolus administration of intravenous contrast. Multiplanar CT image reconstructions and MIPs were obtained to evaluate the vascular anatomy.  CONTRAST:  42mL OMNIPAQUE IOHEXOL 350 MG/ML SOLN  COMPARISON:  CT of the chest on 06/18/2013  FINDINGS: Heart: The heart is normal in appearance. No significant coronary artery calcifications.  Vascular structures: The pulmonary arteries are  well opacified. There is no acute pulmonary embolus. There is atherosclerotic calcification of the aortic arch. No aneurysm or dissection. There and arch anatomy.  Mediastinum/thyroid: There small prevascular lymph nodes. Left prevascular lymph node is 1.4 cm. Subcarinal node is 1.4 cm. Small node anterior to the superior vena cava is 1.0 cm. Nodes appear similar to prior study.  Lungs/Airways: Airways are patent. There are mild hazy densities within the lungs bilaterally. Findings may be related to the patient's known sarcoidosis. No pleural effusions or focal consolidations are identified.  Upper abdomen: Unremarkable.  Chest wall/osseous structures: Degenerative changes in the spine  Review of the MIP images confirms the above findings.  IMPRESSION: 1. Technically adequate exam showing no pulmonary embolus. 2. Stable appearance of small mediastinal lymph nodes. 3. Patchy appearance of the lung parenchyma likely related to the patient's sarcoidosis. Acute superimposed infectious process would also be a consideration.   Electronically Signed   By: Shon Hale M.D.   On: 07/10/2014 17:52    EKG: P wave suggestive of L atrial enlargement CXR: Chest CT suggestive of LVH, mild scattered groundglass opacities.  ASSESSMENT / PLAN:  Active Problems:   Sarcoidosis   Hypertensive urgency   Hypertensive emergency   PULMONARY A: History of sarcoidosis with pulmonary involvement R/o edema on top to me P:   2 liters Neg balance continued as goal pcxr in am Was neg on own last 24 hr, if not further add lasix  CARDIOVASCULAR A: Hypertensive Emergency P:   Nicardipine to MAp goals MAP goal 100-110 x 48 hr then reduce furtehr if good clinical status Add oral norvasc, hctz Allow neg balance  RENAL A: Renal function intact, hypomag P:   Mag supp May need lasix Chem in am   GASTROINTESTINAL A: Nausea and vomiting - resolving P:   Add full liq Controlling BP  HEMATOLOGIC A: Mild anemia,  dvt prevention P:   Hep sub q  scd  INFECTIOUS A: No active issues P:   Follow fever curve pcxr in am   ENDOCRINE A: Obesity / metabolic syndrome / DM  / elevated glu P:   ssi Add lantus and a diet   NEUROLOGIC A: Hypertensive emergency, headache P:   MAP goals see cvs May need mri brain if headache continues   TODAY'S SUMMARY: may need mri, controlling BP, add oral agents, add diet, lantus  I have personally obtained a history, examined the patient, evaluated laboratory and imaging results, formulated the assessment and plan and placed orders.  CRITICAL CARE: The patient is critically ill with multiple organ systems failure and requires high complexity decision making for assessment and support, frequent evaluation and titration of therapies, application of advanced monitoring technologies and extensive interpretation of multiple databases. Critical Care Time devoted to patient care services described in this note is 45 minutes.   Lavon Paganini. Titus Mould, MD, [place] [facility]: [facility]

## 2014-07-11 NOTE — Progress Notes (Signed)
UR Completed.  [phone]  

## 2014-07-11 NOTE — H&P (Signed)
PULMONARY / CRITICAL CARE MEDICINE HISTORY AND PHYSICAL EXAMINATION   Name: Cassandra Burns MRN: [id number] DOB: [birth year]    ADMISSION DATE:  07/10/2014  PRIMARY SERVICE: PCCM  CHIEF COMPLAINT:  Headache  BRIEF PATIENT DESCRIPTION: 48 y/o woman p/w HTN emergency  SIGNIFICANT EVENTS / STUDIES:  Maintained on nicardipine gtt  LINES / TUBES: PIV x2   CULTURES: None  ANTIBIOTICS: None  HISTORY OF PRESENT ILLNESS:  Cassandra Burns is a 48 y.o. woman with a history of obesity and HTN who presented to the hospital with worsening headache x3 days and was found to have significantly elevated blood pressure. The patient reports that she stopping taking her medicine several weeks ago. When questioned as to why she did this, she states that she was acting irrationally, and is aware that the decision was foolish. She had somehow convinced herself that she didn't need to take the medications anymore, but had no basis for this decision. She seems to genuinely admit the foolishness of this decision. She presented to the ED and was found to be markedly hypertensive, with nausea and vomiting. PO medications and IV labetalol were insufficient to normalize her blood pressure, so PCCM was called for admission.  PAST MEDICAL HISTORY :  Past Medical History  Diagnosis Date  . Hypertension   . Asthma   . Iron deficiency anemia due to chronic blood loss   . Menometrorrhagia   . Psoriasis   . Sleep apnea   . 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 Obesity 12/18/2012  . Depression   . Anxiety   . Bone pain 06/07/2013  . Lymphadenopathy 06/07/2013  . Sarcoidosis 07/26/2013  . Unspecified deficiency anemia 07/26/2013  . Hernia of abdominal cavity 10/25/2013   Past Surgical History  Procedure Laterality Date  . Tubal ligation     Prior to Admission medications   Medication Sig Start Date End Date Taking? Authorizing Provider  albuterol (PROVENTIL HFA;VENTOLIN HFA) 108 (90 BASE) MCG/ACT  inhaler Inhale 2 puffs into the lungs every 2 (two) hours as needed for wheezing or shortness of breath (cough). 07/10/14   Mercedes Strupp Camprubi-Soms, PA-C  amLODipine (NORVASC) 5 MG tablet Take 5 mg by mouth 2 (two) times daily.      Historical Provider, MD  amLODipine (NORVASC) 5 MG tablet Take 1 tablet (5 mg total) by mouth 2 (two) times daily. 07/10/14   Mercedes Strupp Camprubi-Soms, PA-C  FLUoxetine (PROZAC) 40 MG capsule Take 40 mg by mouth every morning.    Historical Provider, MD  guaiFENesin (MUCINEX) 600 MG 12 hr tablet Take 1 tablet (600 mg total) by mouth 2 (two) times daily as needed for cough or to loosen phlegm. 07/10/14   Mercedes Strupp Camprubi-Soms, PA-C  HYDROcodone-acetaminophen (NORCO) 5-325 MG per tablet Take 1-2 tablets by mouth every 6 (six) hours as needed for severe pain. 07/10/14   Mercedes Strupp Camprubi-Soms, PA-C  loratadine (CLARITIN) 10 MG tablet Take 10 mg by mouth daily.      Historical Provider, MD  loratadine (CLARITIN) 10 MG tablet Take 1 tablet (10 mg total) by mouth daily. 07/10/14   Mercedes Strupp Camprubi-Soms, PA-C  losartan-hydrochlorothiazide (HYZAAR) 100-12.5 MG per tablet Take 1 tablet by mouth every morning.  09/20/13   Historical Provider, MD  losartan-hydrochlorothiazide (HYZAAR) 100-12.5 MG per tablet Take 1 tablet by mouth daily. 07/10/14   Mercedes Strupp Camprubi-Soms, PA-C  meloxicam (MOBIC) 7.5 MG tablet Take 1 tablet (7.5 mg total) by mouth 2 (two) times daily after a  meal. Patient not taking: Reported on 07/10/2014 01/28/14   Billy Fischer, MD  metFORMIN (GLUCOPHAGE) 1000 MG tablet Take 1,000 mg by mouth 2 (two) times daily with a meal.    Historical Provider, MD  naproxen (NAPROSYN) 500 MG tablet Take 1 tablet (500 mg total) by mouth 2 (two) times daily as needed for mild pain, moderate pain or headache (TAKE WITH MEALS.). 07/10/14   Mercedes Strupp Camprubi-Soms, PA-C  sulfamethoxazole-trimethoprim (BACTRIM DS,SEPTRA DS) 800-160 MG per  tablet Take 1 tablet by mouth 2 (two) times daily. 07/10/14   Mercedes Strupp Camprubi-Soms, PA-C   No Known Allergies  FAMILY HISTORY:  Family History  Problem Relation Age of Onset  . Diabetes type II Father   . Asthma Father   . Hypertension Father   . Heart murmur Father   . Alcohol abuse Father   . Drug abuse Father   . Hypertension Mother   . Alcohol abuse Mother   . Drug abuse Mother    SOCIAL HISTORY:  reports that she has been smoking Cigarettes.  She has been smoking about 0.25 packs per day. She has never used smokeless tobacco. She reports that she drinks alcohol. She reports that she uses illicit drugs (Marijuana) about 7 times per week.  REVIEW OF SYSTEMS:  12 systems reveiwed and are negative except as mentioned in the HPI.  SUBJECTIVE:   VITAL SIGNS: Temp:  [98 F (36.7 C)-98.2 F (36.8 C)] 98 F (36.7 C) (12/14 0112) Pulse Rate:  [89-109] 102 (12/14 0215) Resp:  [2-27] 19 (12/14 0215) BP: (132-223)/(66-135) 155/80 mmHg (12/14 0215) SpO2:  [89 %-100 %] 95 % (12/14 0215) Weight:  [221 lb (100.245 kg)-222 lb 14.2 oz (101.1 kg)] 222 lb 14.2 oz (101.1 kg) (12/14 0112) HEMODYNAMICS:   VENTILATOR SETTINGS:   INTAKE / OUTPUT: Intake/Output      12/13 0701 - 12/14 0700   P.O. 120   I.V. (mL/kg) 172.1 (1.7)   Total Intake(mL/kg) 292.1 (2.9)   Urine (mL/kg/hr) 300   Total Output 300   Net -7.9         PHYSICAL EXAMINATION: General:  Obese woman in NAD lying up stretcher. Neuro:  No deficits. HEENT:  MMM Neck: Very obese w/ redundant tissue Cardiovascular:  RRR Lungs:  CTAB Abdomen:  Obese Musculoskeletal:  Intact Skin:  Intact  LABS:  CBC  Recent Labs Lab 07/10/14 1320  WBC 3.7*  HGB 11.3*  HCT 37.3  PLT 203   Coag's No results for input(s): APTT, INR in the last 168 hours. BMET  Recent Labs Lab 07/10/14 1320  NA 134*  K 3.9  CL 95*  CO2 22  BUN 10  CREATININE 0.63  GLUCOSE 256*   Electrolytes  Recent Labs Lab  07/10/14 1320  CALCIUM 9.7   Sepsis Markers No results for input(s): LATICACIDVEN, PROCALCITON, O2SATVEN in the last 168 hours. ABG No results for input(s): PHART, PCO2ART, PO2ART in the last 168 hours. Liver Enzymes No results for input(s): AST, ALT, ALKPHOS, BILITOT, ALBUMIN in the last 168 hours. Cardiac Enzymes  Recent Labs Lab 07/10/14 1320  PROBNP 175.8*   Glucose  Recent Labs Lab 07/10/14 2112 07/11/14 0132  GLUCAP 235* 331*    Imaging Ct Head Wo Contrast  07/10/2014   CLINICAL DATA:  PERSISTENT  H.A FOR 3 DAYS WITH HX OF HTN  EXAM: CT HEAD WITHOUT CONTRAST  TECHNIQUE: Contiguous axial images were obtained from the base of the skull through the vertex without intravenous contrast.  COMPARISON:  11/07/2011  FINDINGS: There is an old lacunar infarct involving the right external capsule. There is no intra or extra-axial fluid collection or mass lesion. The basilar cisterns and ventricles have a normal appearance. There is no CT evidence for acute infarction or hemorrhage. Bone windows are unremarkable.  IMPRESSION: 1. 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 2.  No evidence for acute intracranial abnormality.   Electronically Signed   By: Shon Hale M.D.   On: 07/10/2014 16:42   Ct Angio Chest Pe W/cm &/or Wo Cm  07/10/2014   CLINICAL DATA:  Chest pain and shortness of breath for the past 3-5 days. Headache and nausea. History of hypertension and diabetes, sarcoidosis.  EXAM: CT ANGIOGRAPHY CHEST WITH CONTRAST  TECHNIQUE: Multidetector CT imaging of the chest was performed using the standard protocol during bolus administration of intravenous contrast. Multiplanar CT image reconstructions and MIPs were obtained to evaluate the vascular anatomy.  CONTRAST:  41mL OMNIPAQUE IOHEXOL 350 MG/ML SOLN  COMPARISON:  CT of the chest on 06/18/2013  FINDINGS: Heart: The heart is normal in appearance. No significant coronary artery calcifications.  Vascular structures: The  pulmonary arteries are well opacified. There is no acute pulmonary embolus. There is atherosclerotic calcification of the aortic arch. No aneurysm or dissection. There and arch anatomy.  Mediastinum/thyroid: There small prevascular lymph nodes. Left prevascular lymph node is 1.4 cm. Subcarinal node is 1.4 cm. Small node anterior to the superior vena cava is 1.0 cm. Nodes appear similar to prior study.  Lungs/Airways: Airways are patent. There are mild hazy densities within the lungs bilaterally. Findings may be related to the patient's known sarcoidosis. No pleural effusions or focal consolidations are identified.  Upper abdomen: Unremarkable.  Chest wall/osseous structures: Degenerative changes in the spine  Review of the MIP images confirms the above findings.  IMPRESSION: 1. Technically adequate exam showing no pulmonary embolus. 2. Stable appearance of small mediastinal lymph nodes. 3. Patchy appearance of the lung parenchyma likely related to the patient's sarcoidosis. Acute superimposed infectious process would also be a consideration.   Electronically Signed   By: Shon Hale M.D.   On: 07/10/2014 17:52    EKG: P wave suggestive of L atrial enlargement CXR: Chest CT suggestive of LVH, mild scattered groundglass opacities.  ASSESSMENT / PLAN:  Active Problems:   Sarcoidosis   Hypertensive urgency   Hypertensive emergency   PULMONARY A: History of sarcoidosis with pulmonary involvement P:   Imaging does appear somewhat worse, but could be clouded by current hypertensive state causing a small amount of cardiogenic pulmonary edema. No intervention acutely.  CARDIOVASCULAR A: Hypertensive Emergency P:   Headache refractory to routine medications. Responding well to nicardipine. Restart home meds and aim for 20% reduction in SBP every 4-6 hours to avoid rapid overcorrection.  RENAL A: Renal function intact P:   --  GASTROINTESTINAL A: Nausea and vomiting P:   Due to elevated BPs,  will look for improvement with improved BP control.  HEMATOLOGIC A: Mild anemia P:   Not active issue  INFECTIOUS A: No active issues P:   ---  ENDOCRINE A: Obesity / metabolic syndrome P:   Patient will need counseling / support on lifestyle modification prior to discharge  NEUROLOGIC A: Hypertensive emergency P:   No neurologic signs, will cautiously reduce BPs to avoid watershed infarction.  BEST PRACTICE / DISPOSITION Level of Care:  ICU Primary Service:  PCCM Consultants:  None Code Status:  Full Diet:  NPO DVT Px:  Heparin GI Px:  None indicated Skin Integrity:  Intact Social / Family:  Patient able to contact directly  TODAY'S SUMMARY: 48 y/o woman with hypertensive emergency.  I have personally obtained a history, examined the patient, evaluated laboratory and imaging results, formulated the assessment and plan and placed orders.  CRITICAL CARE: The patient is critically ill with multiple organ systems failure and requires high complexity decision making for assessment and support, frequent evaluation and titration of therapies, application of advanced monitoring technologies and extensive interpretation of multiple databases. Critical Care Time devoted to patient care services described in this note is 46 minutes.   Luz Brazen, MD Pulmonary and [place] Pager: [phone]   07/11/2014, 2:23 AM

## 2014-07-11 NOTE — Progress Notes (Signed)
Echocardiogram 2D Echocardiogram has been performed.  Cassandra Burns 07/11/2014, 1:45 PM

## 2014-07-11 NOTE — Progress Notes (Signed)
Inpatient Diabetes Program Recommendations  AACE/ADA: New Consensus Statement on Inpatient Glycemic Control (2013)  Target Ranges:  Prepandial:   less than 140 mg/dL      Peak postprandial:   less than 180 mg/dL (1-2 hours)      Critically ill patients:  140 - 180 mg/dL   Reason for Assessment:  Results for CUMI, SANAGUSTIN (MRN [id number]) as of 07/11/2014 10:26  Ref. Range 07/10/2014 21:12 07/11/2014 00:27 07/11/2014 01:32 07/11/2014 03:32  Glucose-Capillary Latest Range: 70-99 mg/dL 235 (H) 338 (H) 331 (H) 304 (H)   Diabetes history:  Type 2 diabetes Outpatient Diabetes medications: Metformin 1000 mg bid Current orders for Inpatient glycemic control:  Lantus 10 units daily, Novolog resistant tid with meals and HS, Novolog 4 units tid with meals  CBG's increased.  Agree with start of basal insulin.  May need to increase basal insulin based on fasting CBG's.  Please check A1C to determine pre-hospitalization glycemic control.   Thanks, Adah Perl, RN, BC-ADM Inpatient Diabetes Coordinator Pager [phone]

## 2014-07-12 ENCOUNTER — Inpatient Hospital Stay (HOSPITAL_COMMUNITY): Payer: Medicare Other

## 2014-07-12 DIAGNOSIS — R059 Cough, unspecified: Secondary | ICD-10-CM | POA: Insufficient documentation

## 2014-07-12 DIAGNOSIS — I10 Essential (primary) hypertension: Principal | ICD-10-CM

## 2014-07-12 DIAGNOSIS — R05 Cough: Secondary | ICD-10-CM

## 2014-07-12 LAB — BASIC METABOLIC PANEL
Anion gap: 14 (ref 5–15)
BUN: 13 mg/dL (ref 6–23)
CHLORIDE: 97 meq/L (ref 96–112)
CO2: 23 mEq/L (ref 19–32)
CREATININE: 0.69 mg/dL (ref 0.50–1.10)
Calcium: 9.8 mg/dL (ref 8.4–10.5)
GFR calc non Af Amer: >90 mL/min (ref >90)
GLUCOSE: 249 mg/dL — AB (ref 70–99)
Potassium: 4.3 mEq/L (ref 3.7–5.3)
Sodium: 134 mEq/L — ABNORMAL LOW (ref 137–147)

## 2014-07-12 LAB — PHOSPHORUS: PHOSPHORUS: 4.3 mg/dL (ref 2.3–4.6)

## 2014-07-12 LAB — GLUCOSE, CAPILLARY
Glucose-Capillary: 236 mg/dL — ABNORMAL HIGH (ref 70–99)
Glucose-Capillary: 250 mg/dL — ABNORMAL HIGH (ref 70–99)
Glucose-Capillary: 266 mg/dL — ABNORMAL HIGH (ref 70–99)
Glucose-Capillary: 131 mg/dL — ABNORMAL HIGH (ref 70–99)

## 2014-07-12 LAB — MAGNESIUM: Magnesium: 1.9 mg/dL (ref 1.5–2.5)

## 2014-07-12 MED ORDER — INSULIN GLARGINE 100 UNIT/ML ~~LOC~~ SOLN
20.0000 [IU] | Freq: Every day | SUBCUTANEOUS | Status: DC
  Administered 2014-07-12 – 2014-07-13 (×2): 20 [IU] via SUBCUTANEOUS
  Filled 2014-07-12 (×2): qty 0.2

## 2014-07-12 MED ORDER — LABETALOL HCL 100 MG PO TABS
100.0000 mg | ORAL_TABLET | Freq: Two times a day (BID) | ORAL | Status: DC
Start: 2014-07-12 — End: 2014-07-13
  Administered 2014-07-12 – 2014-07-13 (×3): 100 mg via ORAL
  Filled 2014-07-12 (×4): qty 1

## 2014-07-12 MED ORDER — INSULIN GLARGINE 100 UNIT/ML ~~LOC~~ SOLN: 20.0000 [IU] | Freq: Every day | SUBCUTANEOUS | Status: DC

## 2014-07-12 NOTE — H&P (Signed)
PULMONARY / CRITICAL CARE MEDICINE HISTORY AND PHYSICAL EXAMINATION   Name: Cassandra Burns MRN: [id number] DOB: 1965/08/14    ADMISSION DATE:  07/10/2014  PRIMARY SERVICE: PCCM  CHIEF COMPLAINT:  Headache  BRIEF PATIENT DESCRIPTION: 48 y/o woman p/w HTN emergency  SIGNIFICANT EVENTS / STUDIES:  Maintained on nicardipine gtt 12/13 CT chest >>>sarcoid 12/13 ct head>>>neg acute  LINES / TUBES: PIV x2   CULTURES: None  ANTIBIOTICS: None  SUBJECTIVE: nicardipine off, symptoms resolved  VITAL SIGNS: Temp:  [97.6 F (36.4 C)-99 F (37.2 C)] 97.9 F (36.6 C) (12/15 0826) Pulse Rate:  [88-109] 104 (12/15 0900) Resp:  [13-24] 23 (12/15 0900) BP: (102-153)/(59-98) 102/69 mmHg (12/15 0900) SpO2:  [88 %-100 %] 88 % (12/15 0900) Weight:  [100.8 kg (222 lb 3.6 oz)] 100.8 kg (222 lb 3.6 oz) (12/15 0500) HEMODYNAMICS:   VENTILATOR SETTINGS:   INTAKE / OUTPUT: Intake/Output      12/14 0701 - 12/15 0700 12/15 0701 - 12/16 0700   P.O.     I.V. (mL/kg) 780 (7.7) 52.5 (0.5)   Total Intake(mL/kg) 780 (7.7) 52.5 (0.5)   Urine (mL/kg/hr) 1125 (0.5) 250 (0.8)   Total Output 1125 250   Net -345 -197.5          PHYSICAL EXAMINATION: General:  Obese woman, no distress Neuro: alert, follows commands, nonofocal HEENT:  Obese, perrl Neck: Very obese w/ redundant tissue Cardiovascular:  RRR s1 s2  Lungs:  CTA distant Abdomen:  Obese, bs wnl, no r/g Musculoskeletal:  Intact Skin:  Intact  LABS:  CBC  Recent Labs Lab 07/10/14 1320 07/11/14 0215  WBC 3.7* 4.7  HGB 11.3* 11.2*  HCT 37.3 37.1  PLT 203 215   Coag's No results for input(s): APTT, INR in the last 168 hours. BMET  Recent Labs Lab 07/10/14 1320 07/11/14 0215  NA 134* 135*  K 3.9 4.3  CL 95* 94*  CO2 22 26  BUN 10 10  CREATININE 0.63 0.64  GLUCOSE 256* 357*   Electrolytes  Recent Labs Lab 07/10/14 1320 07/11/14 0215 07/12/14 0728  CALCIUM 9.7 9.7  --   MG  --  1.6 1.9  PHOS  --  4.0  4.3   Sepsis Markers No results for input(s): LATICACIDVEN, PROCALCITON, O2SATVEN in the last 168 hours. ABG No results for input(s): PHART, PCO2ART, PO2ART in the last 168 hours. Liver Enzymes No results for input(s): AST, ALT, ALKPHOS, BILITOT, ALBUMIN in the last 168 hours. Cardiac Enzymes  Recent Labs Lab 07/10/14 1320  PROBNP 175.8*   Glucose  Recent Labs Lab 07/11/14 0749 07/11/14 1205 07/11/14 1605 07/11/14 1928 07/11/14 2214 07/12/14 0824  GLUCAP 339* 171* 175* 265* 287* 236*    Imaging Ct Head Wo Contrast  07/10/2014   CLINICAL DATA:  PERSISTENT  H.A FOR 3 DAYS WITH HX OF HTN  EXAM: CT HEAD WITHOUT CONTRAST  TECHNIQUE: Contiguous axial images were obtained from the base of the skull through the vertex without intravenous contrast.  COMPARISON:  11/07/2011  FINDINGS: There is an old lacunar infarct involving the right external capsule. There is no intra or extra-axial fluid collection or mass lesion. The basilar cisterns and ventricles have a normal appearance. There is no CT evidence for acute infarction or hemorrhage. Bone windows are unremarkable.  IMPRESSION: 1. 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 2.  No evidence for acute intracranial abnormality.   Electronically Signed   By: Shon Hale M.D.   On: 07/10/2014 16:42  Ct Angio Chest Pe W/cm &/or Wo Cm  07/10/2014   CLINICAL DATA:  Chest pain and shortness of breath for the past 3-5 days. Headache and nausea. History of hypertension and diabetes, sarcoidosis.  EXAM: CT ANGIOGRAPHY CHEST WITH CONTRAST  TECHNIQUE: Multidetector CT imaging of the chest was performed using the standard protocol during bolus administration of intravenous contrast. Multiplanar CT image reconstructions and MIPs were obtained to evaluate the vascular anatomy.  CONTRAST:  44mL OMNIPAQUE IOHEXOL 350 MG/ML SOLN  COMPARISON:  CT of the chest on 06/18/2013  FINDINGS: Heart: The heart is normal in appearance. No significant  coronary artery calcifications.  Vascular structures: The pulmonary arteries are well opacified. There is no acute pulmonary embolus. There is atherosclerotic calcification of the aortic arch. No aneurysm or dissection. There and arch anatomy.  Mediastinum/thyroid: There small prevascular lymph nodes. Left prevascular lymph node is 1.4 cm. Subcarinal node is 1.4 cm. Small node anterior to the superior vena cava is 1.0 cm. Nodes appear similar to prior study.  Lungs/Airways: Airways are patent. There are mild hazy densities within the lungs bilaterally. Findings may be related to the patient's known sarcoidosis. No pleural effusions or focal consolidations are identified.  Upper abdomen: Unremarkable.  Chest wall/osseous structures: Degenerative changes in the spine  Review of the MIP images confirms the above findings.  IMPRESSION: 1. Technically adequate exam showing no pulmonary embolus. 2. Stable appearance of small mediastinal lymph nodes. 3. Patchy appearance of the lung parenchyma likely related to the patient's sarcoidosis. Acute superimposed infectious process would also be a consideration.   Electronically Signed   By: Shon Hale M.D.   On: 07/10/2014 17:52   Dg Chest Port 1 View  07/12/2014   CLINICAL DATA:  Pulmonary edema.  Sarcoidosis few  EXAM: PORTABLE CHEST - 1 VIEW  COMPARISON:  CT 07/10/2014.  FINDINGS: Mediastinum hilar structures are unremarkable. Cardiomegaly with mild pulmonary vascular prominence. Diffuse interstitial prominence . congestive heart failure could present this fashion. Pulmonary interstitial changes could also be related to the patient's known sarcoidosis. Small left pleural effusion cannot be excluded. No pneumothorax. No acute osseus abnormality.  IMPRESSION: 1. Congestive heart failure with bilateral pulmonary interstitial edema. 2. Interstitial prominence may also be related to the patient's known sarcoidosis.   Electronically Signed   By: Marcello Moores  Register   On:  07/12/2014 07:36    ASSESSMENT / PLAN:  Active Problems:   Sarcoidosis   Hypertensive urgency   Hypertensive emergency   HTN (hypertension)   Pulmonary edema   SOB (shortness of breath)  pcxr - int prominence PULMONARY A: History of sarcoidosis with pulmonary involvement Edema component P:   Was neg, maintain neg balance  CARDIOVASCULAR A: Hypertensive Emergency- improved P:   Nicardipine to MAp goals- off MAP goal 100 oral norvasc, hctz ARB, ACEI home meds on board, appears will need additional agents Allow neg balance further - add labetalol (tachy) Keep tele Await am chem, if crt worse dc arb, acei  RENAL A: Renal function intact P:   May need lasix Chem in am  Await am bmet  GASTROINTESTINAL A: Nausea and vomiting - resolving P:   Tolerating carb mod Controlling BP  HEMATOLOGIC A: Mild anemia, dvt prevention P:   Hep sub q until ambualtion scd  INFECTIOUS A: No active issues P:   Follow fever curve pcxr in am   ENDOCRINE A: Obesity / metabolic syndrome / DM  / elevated glu P:   ssi Increase lantus to 20  NEUROLOGIC A: Hypertensive emergency, headache P:   Control BP, no further headache   TODAY'S SUMMARY: add labetalol, await am bmet, improved, off drip, to tele, triad, increase lantus  Lavon Paganini. Titus Mould, MD, [place] Pgr: [place] [facility]

## 2014-07-12 NOTE — Progress Notes (Signed)
CPAP set up BiLevel IPAP 12/ EPAP 4, in Patients room, but patient not ready for nap.

## 2014-07-12 NOTE — Progress Notes (Signed)
Inpatient Diabetes Program Recommendations  AACE/ADA: New Consensus Statement on Inpatient Glycemic Control (2013)  Target Ranges:  Prepandial:   less than 140 mg/dL      Peak postprandial:   less than 180 mg/dL (1-2 hours)      Critically ill patients:  140 - 180 mg/dL   Results for Cassandra Burns, Cassandra Burns (MRN [id number]) as of 07/12/2014 09:43  Ref. Range 07/11/2014 12:05 07/11/2014 16:05 07/11/2014 19:28 07/11/2014 22:14 07/12/2014 08:24  Glucose-Capillary Latest Range: 70-99 mg/dL 171 (H) 175 (H) 265 (H) 287 (H) 236 (H)    Diabetes history: Type 2 diabetes Outpatient Diabetes medications: Metformin 1000 mg bid Current orders for Inpatient glycemic control:  Lantus 10 units daily, Novolog resistant tid with meals and HS, Novolog 4 units tid with meals  Awaiting A1C - consider increasing Lantus to 15 units qday as fbs have been 339 and 236 mg/dl the past two days  Consider increasing mealtime Novolog to 8 units tid.   Gentry Fitz, RN, BA, MHA, CDE Diabetes Coordinator Inpatient Diabetes Program  [phone] (Team Pager) [phone] [facility]) 07/12/2014 9:56 AM

## 2014-07-13 ENCOUNTER — Telehealth: Payer: Self-pay | Admitting: Hematology and Oncology

## 2014-07-13 DIAGNOSIS — D869 Sarcoidosis, unspecified: Secondary | ICD-10-CM

## 2014-07-13 DIAGNOSIS — G4733 Obstructive sleep apnea (adult) (pediatric): Secondary | ICD-10-CM

## 2014-07-13 LAB — BASIC METABOLIC PANEL
ANION GAP: 16 — AB (ref 5–15)
BUN: 19 mg/dL (ref 6–23)
CHLORIDE: 98 meq/L (ref 96–112)
CO2: 23 mEq/L (ref 19–32)
Calcium: 9.6 mg/dL (ref 8.4–10.5)
Creatinine, Ser: 0.85 mg/dL (ref 0.50–1.10)
GFR, EST NON AFRICAN AMERICAN: 80 mL/min — AB (ref >90)
Glucose, Bld: 285 mg/dL — ABNORMAL HIGH (ref 70–99)
POTASSIUM: 4.4 meq/L (ref 3.7–5.3)
Sodium: 137 mEq/L (ref 137–147)

## 2014-07-13 LAB — GLUCOSE, CAPILLARY
Glucose-Capillary: 271 mg/dL — ABNORMAL HIGH (ref 70–99)
Glucose-Capillary: 282 mg/dL — ABNORMAL HIGH (ref 70–99)

## 2014-07-13 LAB — URINE CULTURE: Colony Count: 60000

## 2014-07-13 LAB — HEMOGLOBIN A1C
HEMOGLOBIN A1C: 10 % — AB (ref <5.7)
Mean Plasma Glucose: 240 mg/dL — ABNORMAL HIGH (ref <117)

## 2014-07-13 MED ORDER — LOSARTAN POTASSIUM-HCTZ 100-25 MG PO TABS: 1.0000 | ORAL_TABLET | Freq: Every day | ORAL | Status: DC

## 2014-07-13 MED ORDER — ALBUTEROL SULFATE HFA 108 (90 BASE) MCG/ACT IN AERS: 2.0000 | INHALATION_SPRAY | RESPIRATORY_TRACT | Status: DC | PRN

## 2014-07-13 MED ORDER — AMLODIPINE BESYLATE 10 MG PO TABS: 10.0000 mg | ORAL_TABLET | Freq: Every day | ORAL | Status: DC

## 2014-07-13 MED ORDER — NAPROXEN 500 MG PO TABS: 500.0000 mg | ORAL_TABLET | Freq: Two times a day (BID) | ORAL | Status: DC | PRN

## 2014-07-13 MED ORDER — GUAIFENESIN ER 600 MG PO TB12: 600.0000 mg | ORAL_TABLET | Freq: Two times a day (BID) | ORAL | Status: DC | PRN

## 2014-07-13 MED ORDER — LABETALOL HCL 100 MG PO TABS: 100.0000 mg | ORAL_TABLET | Freq: Two times a day (BID) | ORAL | Status: DC

## 2014-07-13 NOTE — Telephone Encounter (Signed)
pt called to cx lab and ct....she is in hospital and has already had both

## 2014-07-13 NOTE — Care Management Note (Signed)
    Page 1 of 1   07/13/2014     12:09:42 PM CARE MANAGEMENT NOTE 07/13/2014  Patient:  Cassandra Burns, Cassandra Burns   Account Number:  [id number]  Date Initiated:  07/11/2014  Documentation initiated by:  Luz Lex  Subjective/Objective Assessment:   Admitted with hypertensive crisis     Action/Plan:   Anticipated DC Date:  07/14/2014   Anticipated DC Plan:  [place]  CM consult      Choice offered to / List presented to:             Status of service:  Completed, signed off Medicare Important Message given?  YES (If response is "NO", the following Medicare IM given date fields will be blank) Date Medicare IM given:  07/13/2014 Medicare IM given by:   Date Additional Medicare IM given:   Additional Medicare IM given by:    Discharge Disposition:  HOME/SELF CARE  Per UR Regulation:  Reviewed for med. necessity/level of care/duration of stay  If discussed at [place] of Stay Meetings, dates discussed:    Comments:  ContactAllyah, Heather Daughter [phone]  [phone]    Robyne Peers Niece   [phone]  07/13/14- 10- Marvetta Gibbons RN, BSN [phone] Martin Majestic to give pt Medicare IM- admissions had been by and already given copy of IM this am- 07/13/14

## 2014-07-13 NOTE — Discharge Summary (Signed)
Physician Discharge Summary  Patient ID: Cassandra Burns MRN: [id number] DOB/AGE: [birth year] 48 y.o.  Admit date: 07/10/2014 Discharge date: 07/13/2014    Discharge Diagnoses:  Hypertensive Emergency Headache Sarcoidosis with pulmonary involvement Pulmonary edema Nausea and vomiting Anemia Morbid obesity Diabetes mellitus                                                                      DISCHARGE PLAN BY DIAGNOSIS     Hypertensive Emergency Headache  Discharge Plan: -Follow up with Dr. Jeanie Cooks for BP assessment, appointment scheduled -Continue norvasc 10 mg, labetalol 100 mg BID, hyzaar 100-94m DQ -stressed importance of medication compliance  Sarcoidosis with pulmonary involvement Pulmonary edema Obstructive sleep apnea ([place])  Discharge Plan: -follow up in office with Cassandra Burns (pulmonary) as arranged -patient instructed to wear CPAP.  She will need Burns download of CPAP from [facility] at time of follow up  Anemia  Discharge Plan: -PRN CBC monitoring   Nausea and vomiting Morbid obesity  Discharge Plan: -Nausea resolved.   -Diet education, carb modified, heart healthy  Diabetes mellitus   Discharge Plan: -resume metformin, consider SSI / Lantus with PCP.  Her Hgb A1c 10 on 12/15 -Concern she has not been compliant with any medications and would not be compliant with insulin                   DISCHARGE SUMMARY   GYoshie Burns Burns 48y.o. y/o female, current smoker and marijuana user, with Burns PMH of morbid obesity, diabetes mellitus, anxiety, asthma, obstructive sleep apnea, sarcoidosis, psoriasis, anemia and hypertension who [facility] [place] [facility] with Burns three-day history of worsening headache. Patient was found to have significantly elevated blood pressure on initial evaluation. She reported on admission that she had stopped taking her antihypertensives several weeks ago.  Patient reports that she was acting  irrationally and it was Burns full dose decision to stop medications (there were no financial hardships). On presentation she also complained of nausea and vomiting. Initial lab workup notable for the following:  WBC 3.7, Hgb 11.3, NA 134, K 3.9, glucose 256.  CT of the head was evaluated which was negative for acute intracranial abnormality. 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  CTA of the chest was assessed which was negative for PE, patchy appearance of the lung parenchyma in the setting of sarcoidosis with small mediastinal lymph nodes noted.  She was admitted to the intensive care unit for aggressive blood pressure management. Placed on nicardipine drip with Burns goal of 20% reduction in systolic blood pressure over 4-6 hours.  Cautious reduction of blood pressure was achieved over the next 24 hours. Blood pressure was improved and home antihypertensives were added prior to discharge. During admission glucose was controlled with sliding scale insulin and Lantus.  As an outpatient she is on metformin but sliding scale insulin and Lantus may need to be considered.  Patient was medically cleared with stable hemodynamics on 12/16, see discharge plans as above.    SIGNIFICANT DIAGNOSTIC STUDIES 12/13 CT chest >>>sarcoid 12/13 ct head>>>neg acute  MICRO DATA  12/13  UC >> 60k multiple morphotypes    Discharge Exam: General: obese female in NAD  Neuro:  AAO, non-focal CV: s1s2 rrr, no m/r/g PULM: resp's even/non-labored, lungs bilaterally clear GI: obese, soft, bsx4 active Extremities: warm/dry  Cassandra Burns:   07/12/14 2047 07/12/14 2148 07/12/14 2203 07/13/14 0534  BP: 135/92  128/65 145/84  Pulse: 105 100 103 88  Temp: 98.2 F (36.8 C)   97.9 F (36.6 C)  TempSrc:    Oral  Resp: 20 20  20   Height:      Weight:    223 lb 1.7 oz (101.2 kg)  SpO2: 96% 98%  96%     Discharge Labs  BMET  Recent Labs Lab 07/10/14 1320 07/11/14 0215 07/12/14 0728  07/13/14 0245  NA 134* 135* 134* 137  K 3.9 4.3 4.3 4.4  CL 95* 94* 97 98  CO2 22 26 23 23   GLUCOSE 256* 357* 249* 285*  BUN 10 10 13 19   CREATININE 0.63 0.64 0.69 0.85  CALCIUM 9.7 9.7 9.8 9.6  MG  --  1.6 1.9  --   PHOS  --  4.0 4.3  --     CBC  Recent Labs Lab 07/10/14 1320 07/11/14 0215  HGB 11.3* 11.2*  HCT 37.3 37.1  WBC 3.7* 4.7  PLT 203 215     Discharge Instructions    Call Cassandra Burns for:  difficulty breathing, headache or visual disturbances    Complete by:  As directed      Call Cassandra Burns for:  extreme fatigue    Complete by:  As directed      Call Cassandra Burns for:  hives    Complete by:  As directed      Call Cassandra Burns for:  persistant dizziness or light-headedness    Complete by:  As directed      Call Cassandra Burns for:  severe uncontrolled pain    Complete by:  As directed      Call Cassandra Burns for:  temperature >100.4    Complete by:  As directed      Call Cassandra Burns for:    Complete by:  As directed   Vision changes, passing out, headaches     Diet - low sodium heart healthy    Complete by:  As directed      Discharge instructions    Complete by:  As directed   1.  Review your medications carefully as they have changed. 2.  Please take all of your medications as prescribed.  3.  Wear your CPAP as instructed & follow up with Pulmonary in the office as as scheduled. 4.  Record your blood pressures at home and take with you to your next office visit for review.     Increase activity slowly    Complete by:  As directed                 Follow-up Information    Follow up with Cassandra Burns, Cassandra Burns. Schedule an appointment as soon as possible for Burns visit on 07/20/2014.   Specialty:  Internal Medicine   Why:  Appt at 9:15 am at the [facility] office.   Contact information:   Cassandra Burns [id number] [phone]       Follow up with Cassandra Burns,TAMMY, Cassandra Burns On 08/12/2014.   Specialty:  Nurse Practitioner   Why:  Appt at 3:00 PM.  Sleep apnea follow up.  Will need download from home  CPAP machine from [facility].   Contact information:   [address] [place] [address] [phone]  Medication List    STOP taking these medications        losartan-hydrochlorothiazide 100-12.5 MG per tablet  Commonly known as:  HYZAAR  Replaced by:  losartan-hydrochlorothiazide 100-25 MG per tablet     meloxicam 7.5 MG tablet  Commonly known as:  MOBIC      TAKE these medications        albuterol 108 (90 BASE) MCG/ACT inhaler  Commonly known as:  PROVENTIL HFA;VENTOLIN HFA  Inhale 2 puffs into the lungs every 2 (two) hours as needed for wheezing or shortness of breath (cough).     amLODipine 10 MG tablet  Commonly known as:  NORVASC  Take 1 tablet (10 mg total) by mouth daily.     FLUoxetine 40 MG capsule  Commonly known as:  PROZAC  Take 40 mg by mouth every morning.     guaiFENesin 600 MG 12 hr tablet  Commonly known as:  MUCINEX  Take 1 tablet (600 mg total) by mouth 2 (two) times daily as needed for cough or to loosen phlegm.     HYDROcodone-acetaminophen 5-325 MG per tablet  Commonly known as:  NORCO  Take 1-2 tablets by mouth every 6 (six) hours as needed for severe pain.     labetalol 100 MG tablet  Commonly known as:  NORMODYNE  Take 1 tablet (100 mg total) by mouth 2 (two) times daily.     loratadine 10 MG tablet  Commonly known as:  CLARITIN  Take 10 mg by mouth daily.     losartan-hydrochlorothiazide 100-25 MG per tablet  Commonly known as:  HYZAAR  Take 1 tablet by mouth daily.     metFORMIN 1000 MG tablet  Commonly known as:  GLUCOPHAGE  Take 1,000 mg by mouth 2 (two) times daily with Burns meal.     naproxen 500 MG tablet  Commonly known as:  NAPROSYN  Take 1 tablet (500 mg total) by mouth 2 (two) times daily as needed for mild pain, moderate pain or headache (TAKE WITH MEALS.).          Disposition:  Home.  No new home health needs identified prior to discharge.   Discharged Condition: Cassandra Burns has met  maximum benefit of inpatient care and is medically stable and cleared for discharge.  Patient is pending follow up as above.      Time spent on disposition:  Greater than 35 minutes.   Signed: Noe Gens, Cassandra Burns [facility] Pgr: [phone] Office: [phone]   Physician Statement:   The Patient was personally examined, the discharge assessment and plan has been personally reviewed and I agree with ACNP Cassandra Burns' assessment and plan. > 30 minutes of time have been dedicated to discharge assessment, planning and discharge instructions.     Cassandra Noel Cassandra Burns

## 2014-07-14 ENCOUNTER — Other Ambulatory Visit: Payer: Self-pay

## 2014-07-14 ENCOUNTER — Telehealth: Payer: Self-pay | Admitting: Pulmonary Disease

## 2014-07-14 ENCOUNTER — Ambulatory Visit (HOSPITAL_COMMUNITY): Payer: Medicare Other

## 2014-07-14 MED ORDER — ALBUTEROL SULFATE HFA 108 (90 BASE) MCG/ACT IN AERS: 2.0000 | INHALATION_SPRAY | Freq: Four times a day (QID) | RESPIRATORY_TRACT | Status: DC | PRN

## 2014-07-14 NOTE — Telephone Encounter (Signed)
Spoke with Colletta Maryland at [facility], states that Cassandra Burns wrote a rx for ventolin which is not covered by [employer], they will only cover proair.  Pt has never been seen in office, has appt in January with TP.  Dr Halford Chessman is it ok to change rx to proair for insurance purposes?  Thanks!

## 2014-07-14 NOTE — Telephone Encounter (Signed)
RX has been sent in. Nothing further needed 

## 2014-07-14 NOTE — Telephone Encounter (Signed)
Okay to change to proair ?

## 2014-07-18 ENCOUNTER — Ambulatory Visit: Payer: Self-pay | Admitting: Hematology and Oncology

## 2014-08-11 ENCOUNTER — Other Ambulatory Visit (HOSPITAL_COMMUNITY): Payer: Self-pay | Admitting: Psychiatry

## 2014-08-12 ENCOUNTER — Ambulatory Visit: Payer: Self-pay | Admitting: Adult Health

## 2014-08-16 ENCOUNTER — Ambulatory Visit (INDEPENDENT_AMBULATORY_CARE_PROVIDER_SITE_OTHER): Payer: Medicare Other | Admitting: Adult Health

## 2014-08-16 ENCOUNTER — Encounter: Payer: Self-pay | Admitting: Adult Health

## 2014-08-16 VITALS — BP 130/74 | HR 104 | Temp 97.9°F | Ht 59.0 in | Wt 226.0 lb

## 2014-08-16 DIAGNOSIS — D869 Sarcoidosis, unspecified: Secondary | ICD-10-CM

## 2014-08-16 DIAGNOSIS — G4733 Obstructive sleep apnea (adult) (pediatric): Secondary | ICD-10-CM

## 2014-08-16 NOTE — Progress Notes (Signed)
   Subjective:    Patient ID: Cassandra Burns, female    DOB: 1966/07/03, 49 y.o.   MRN: [id number]  HPI 47 with chronic dyspnea on exertion that is multifactorial with obesity hypoventilation/sleep apnea on BILEVEL Smokes marajuana on regular basis.   08/16/2014 [facility] follow up  Pt returns for a post hospital follow up .  Admitted 12/13 -12/16 for HTN emergency.  Pt admits to noncompliance with meds and BILEVEL .  She was tx w/ aggressive b/p rx. CT chest showed neg PE , patchy appearance of lung parenchyma w/ small mediastinal lymph nodes.  Since discharge says she is improved . Wearing BILEVEL 8hr most night. Did not bring card for download. Wants new order for nasal pillows.  Feeling better since discharge. Has PCP and has seen for b/p management .  Last seen in office 03/2013 . Since then has been dx with Sarcoid per pt. Underwent EBUS in May 2015 at [facility]  Scattered non-necrotic granulomas. No malignant cells noted    Review of Systems Constitutional:   No  weight loss, night sweats,  Fevers, chills,  +fatigue, or  lassitude.  HEENT:   No headaches,  Difficulty swallowing,  Tooth/dental problems, or  Sore throat,                No sneezing, itching, ear ache, nasal congestion, post nasal drip,   CV:  No chest pain,  Orthopnea, PND, swelling in lower extremities, anasarca, dizziness, palpitations, syncope.   GI  No heartburn, indigestion, abdominal pain, nausea, vomiting, diarrhea, change in bowel habits, loss of appetite, bloody stools.   Resp:   No chest wall deformity  Skin: no rash or lesions.  GU: no dysuria, change in color of urine, no urgency or frequency.  No flank pain, no hematuria   MS:  No joint pain or swelling.  No decreased range of motion.  No back pain.  Psych:  No change in mood or affect. No depression or anxiety.  No memory loss.         Objective:   Physical Exam GEN: A/Ox3; pleasant , NAD, well nourished , obese   HEENT:   [place]/AT,  EACs-clear, TMs-wnl, NOSE-clear, THROAT-clear, no lesions, no postnasal drip or exudate noted.   NECK:  Supple w/ fair ROM; no JVD; normal carotid impulses w/o bruits; no thyromegaly or nodules palpated; no lymphadenopathy.  RESP  Clear  P & A; w/o, wheezes/ rales/ or rhonchi.no accessory muscle use, no dullness to percussion  CARD:  RRR, no m/r/g  , tr  peripheral edema, pulses intact, no cyanosis or clubbing.  GI:   Soft & nt; nml bowel sounds; no organomegaly or masses detected.  Musco: Warm bil, no deformities or joint swelling noted.   Neuro: alert, no focal deficits noted.    Skin: Warm, no lesions or rashes         Assessment & Plan:

## 2014-08-16 NOTE — Assessment & Plan Note (Signed)
Follow up in 4 weeks with PFT

## 2014-08-16 NOTE — Patient Instructions (Signed)
Continue on current regimen Follow up in 4 weeks with PFTs Contin on bilevel support at night Bring card to next visit for download Order for new mask sent to DME Follow up with Dr Gwenette Greet in 4 weeks after PFT

## 2014-08-16 NOTE — Assessment & Plan Note (Signed)
Continue on BiPAP At bedtime   Wt loss encouraged Download on return  New mask order sent

## 2014-08-26 NOTE — Telephone Encounter (Signed)
err

## 2014-09-14 ENCOUNTER — Other Ambulatory Visit: Payer: Self-pay | Admitting: Pulmonary Disease

## 2014-09-14 DIAGNOSIS — R0600 Dyspnea, unspecified: Secondary | ICD-10-CM

## 2014-09-20 ENCOUNTER — Encounter: Payer: Self-pay | Admitting: Pulmonary Disease

## 2014-09-20 ENCOUNTER — Ambulatory Visit (HOSPITAL_COMMUNITY)
Admission: RE | Admit: 2014-09-20 | Discharge: 2014-09-20 | Disposition: A | Discharge status: Home / Self Care | Payer: Medicare Other | Source: Ambulatory Visit | Attending: Pulmonary Disease | Admitting: Pulmonary Disease

## 2014-09-20 ENCOUNTER — Ambulatory Visit (INDEPENDENT_AMBULATORY_CARE_PROVIDER_SITE_OTHER): Payer: Medicare Other | Admitting: Pulmonary Disease

## 2014-09-20 ENCOUNTER — Encounter (INDEPENDENT_AMBULATORY_CARE_PROVIDER_SITE_OTHER): Payer: Self-pay

## 2014-09-20 VITALS — BP 118/70 | HR 95 | Temp 98.0°F | Ht 60.0 in | Wt 219.8 lb

## 2014-09-20 DIAGNOSIS — G4733 Obstructive sleep apnea (adult) (pediatric): Secondary | ICD-10-CM | POA: Diagnosis not present

## 2014-09-20 DIAGNOSIS — D869 Sarcoidosis, unspecified: Secondary | ICD-10-CM | POA: Diagnosis not present

## 2014-09-20 DIAGNOSIS — R0600 Dyspnea, unspecified: Secondary | ICD-10-CM | POA: Diagnosis not present

## 2014-09-20 LAB — PULMONARY FUNCTION TEST
DL/VA % pred: 101 %
DL/VA: 4.31 ml/min/mmHg/L
DLCO unc % pred: 60 %
DLCO unc: 11.4 ml/min/mmHg
FEF 25-75 POST: 1.66 L/s
FEF 25-75 Pre: 1.5 L/sec
FEF2575-%CHANGE-POST: 10 %
FEF2575-%PRED-POST: 75 %
FEF2575-%Pred-Pre: 67 %
FEV1-%CHANGE-POST: 1 %
FEV1-%PRED-PRE: 63 %
FEV1-%Pred-Post: 64 %
FEV1-Post: 1.28 L
FEV1-Pre: 1.26 L
FEV1FVC-%Change-Post: 2 %
FEV1FVC-%Pred-Pre: 101 %
FEV6-%Change-Post: 0 %
FEV6-%PRED-POST: 63 %
FEV6-%Pred-Pre: 63 %
FEV6-POST: 1.52 L
FEV6-PRE: 1.52 L
FEV6FVC-%Pred-Post: 103 %
FEV6FVC-%Pred-Pre: 103 %
FVC-%Change-Post: 0 %
FVC-%PRED-POST: 61 %
FVC-%Pred-Pre: 61 %
FVC-POST: 1.52 L
FVC-Pre: 1.52 L
Post FEV1/FVC ratio: 85 %
Post FEV6/FVC ratio: 100 %
Pre FEV1/FVC ratio: 83 %
Pre FEV6/FVC Ratio: 100 %
RV % PRED: 113 %
RV: 1.78 L
TLC % pred: 81 %
TLC: 3.62 L

## 2014-09-20 MED ORDER — ALBUTEROL SULFATE (2.5 MG/3ML) 0.083% IN NEBU
2.5000 mg | INHALATION_SOLUTION | Freq: Once | RESPIRATORY_TRACT | Status: AC
  Administered 2014-09-20: 2.5 mg via RESPIRATORY_TRACT

## 2014-09-20 NOTE — Progress Notes (Signed)
   Subjective:    Patient ID: Cassandra Burns, female    DOB: May 08, 1966, 49 y.o.   MRN: [id number]  HPI Patient comes in today for post hospital follow-up. She has a known history of sarcoidosis as well as OSA/OHS. She was recently in the hospital with a hypertensive crisis secondary to med noncompliance, with associated pulmonary edema. She feels that she is doing much better since hospitalization, and has even lost significant weight. She is staying on her bilevel compliantly, but is due for new nasal pillows. She feels that she sleeps well with the device. She denies any significant cough, chest congestion, or mucus production. She has had pulmonary function studies today which showed no airflow obstruction, no restriction, and a mild reduction in diffusion capacity that totally corrects with alveolar volume adjustment.  It should be noted that her PFTs are much improved from her values in 2014. She also had a CT scan during her recent hospitalization that showed very mild lymphadenopathy in the mediastinum, and groundglass opacities consistent with her pulmonary edema from her hypertensive crisis.   Review of Systems  Constitutional: Negative for fever and unexpected weight change.  HENT: Negative for congestion, dental problem, ear pain, nosebleeds, postnasal drip, rhinorrhea, sinus pressure, sneezing, sore throat and trouble swallowing.   Eyes: Negative for redness and itching.  Respiratory: Positive for cough, chest tightness, shortness of breath and wheezing.   Cardiovascular: Negative for palpitations and leg swelling.  Gastrointestinal: Negative for nausea and vomiting.  Genitourinary: Negative for dysuria.  Musculoskeletal: Negative for joint swelling.  Skin: Negative for rash.  Neurological: Negative for headaches.  Hematological: Does not bruise/bleed easily.  Psychiatric/Behavioral: Negative for dysphoric mood. The patient is not nervous/anxious.        Objective:   Physical Exam Obese female in no acute distress Nose without purulence or discharge noted No skin breakdown or pressure necrosis from the C Pap mask Neck without lymphadenopathy or thyromegaly Chest with clear breath sounds bilaterally, no wheezing Cardiac exam with regular rate and rhythm Lower extremities with no significant edema, no cyanosis Alert and oriented, moves all 4 extremities.       Assessment & Plan:

## 2014-09-20 NOTE — Assessment & Plan Note (Signed)
The patient continues to do very well with her bilevel device, and is due for new supplies. Send an order to her care company for this.

## 2014-09-20 NOTE — Patient Instructions (Signed)
Will send an order to [facility] to get you new nasal pillows Keep working on weight loss, you are doing great. Your breathing studies look very good.  Would not do anything for your sarcoid currently. Ok to stop albuterol inhaler followup with me again in 33mos

## 2014-09-20 NOTE — Assessment & Plan Note (Signed)
The patient has a history of sarcoidosis with very mild mediastinal lymphadenopathy on her prior scan, but no significant parenchymal lung involvement. Her PFTs today showed no airflow obstruction, and a totally normal total lung capacity, and her diffusion capacity that corrects totally to normal with alveolar volume adjustment. Her numbers are actually much improved from 2014. I see no reason to treat her sarcoid at this time, especially since prednisone would simply cause her to gain further weight and also give her blood sugar control issues. She feels overall that her breathing is actually doing better since she has lost weight, and denies any significant airway symptoms.

## 2014-09-23 ENCOUNTER — Telehealth: Payer: Self-pay | Admitting: *Deleted

## 2014-09-23 NOTE — Telephone Encounter (Signed)
Called and LM with CardioPulm Dept at [facility]  Need PFT faxed. PFT that is scanned in computer does have all he numbers and values listed. Consists of mostly charts.

## 2014-09-23 NOTE — Telephone Encounter (Signed)
Lyndel Pleasure, [facility] respiratory re-faxing pft

## 2014-09-23 NOTE — Telephone Encounter (Signed)
-----   Message from Chesley Mires, MD sent at 09/22/2014 11:44 AM EST ----- Can you get copy of this PFT printed out.  Dr. Melvyn Novas read it at [facility], but didn't sign out of not correctly >> can't seen any numbers in Epic report.  Thanks.

## 2014-09-23 NOTE — Telephone Encounter (Signed)
PFT received, will give to Dr Halford Chessman on Monday 09/26/14 Will send to Dr Halford Chessman as Juluis Rainier.

## 2014-09-26 NOTE — Telephone Encounter (Signed)
PFTs reviewed by Dr. Gwenette Greet with pt on 09/20/14.

## 2014-10-03 DIAGNOSIS — J4520 Mild intermittent asthma, uncomplicated: Secondary | ICD-10-CM | POA: Diagnosis not present

## 2014-10-03 DIAGNOSIS — Z6841 Body Mass Index (BMI) 40.0 and over, adult: Secondary | ICD-10-CM | POA: Diagnosis not present

## 2014-10-03 DIAGNOSIS — I10 Essential (primary) hypertension: Secondary | ICD-10-CM | POA: Diagnosis not present

## 2014-10-03 DIAGNOSIS — E1165 Type 2 diabetes mellitus with hyperglycemia: Secondary | ICD-10-CM | POA: Diagnosis not present

## 2014-10-23 ENCOUNTER — Encounter (HOSPITAL_COMMUNITY): Payer: Self-pay | Admitting: Emergency Medicine

## 2014-10-23 ENCOUNTER — Emergency Department (HOSPITAL_COMMUNITY)
Admission: EM | Admit: 2014-10-23 | Discharge: 2014-10-23 | Disposition: A | Discharge status: Home / Self Care | Payer: Medicare Other | Source: Home / Self Care | Attending: Family Medicine | Admitting: Family Medicine

## 2014-10-23 ENCOUNTER — Other Ambulatory Visit (HOSPITAL_COMMUNITY)
Admission: RE | Admit: 2014-10-23 | Discharge: 2014-10-23 | Disposition: A | Discharge status: Home / Self Care | Payer: Medicare Other | Source: Ambulatory Visit | Attending: Family Medicine | Admitting: Family Medicine

## 2014-10-23 DIAGNOSIS — N760 Acute vaginitis: Secondary | ICD-10-CM

## 2014-10-23 DIAGNOSIS — I10 Essential (primary) hypertension: Secondary | ICD-10-CM | POA: Insufficient documentation

## 2014-10-23 DIAGNOSIS — F329 Major depressive disorder, single episode, unspecified: Secondary | ICD-10-CM | POA: Insufficient documentation

## 2014-10-23 DIAGNOSIS — Z113 Encounter for screening for infections with a predominantly sexual mode of transmission: Secondary | ICD-10-CM | POA: Diagnosis not present

## 2014-10-23 DIAGNOSIS — N920 Excessive and frequent menstruation with regular cycle: Secondary | ICD-10-CM | POA: Insufficient documentation

## 2014-10-23 DIAGNOSIS — D500 Iron deficiency anemia secondary to blood loss (chronic): Secondary | ICD-10-CM | POA: Insufficient documentation

## 2014-10-23 DIAGNOSIS — Z79899 Other long term (current) drug therapy: Secondary | ICD-10-CM | POA: Insufficient documentation

## 2014-10-23 DIAGNOSIS — J45909 Unspecified asthma, uncomplicated: Secondary | ICD-10-CM | POA: Insufficient documentation

## 2014-10-23 DIAGNOSIS — E119 Type 2 diabetes mellitus without complications: Secondary | ICD-10-CM | POA: Insufficient documentation

## 2014-10-23 MED ORDER — FLUCONAZOLE 150 MG PO TABS: 150.0000 mg | ORAL_TABLET | Freq: Once | ORAL | Status: DC

## 2014-10-23 NOTE — ED Provider Notes (Signed)
Cassandra Burns is a 49 y.o. female who presents to Urgent Care today for vaginal itching and discharge present for 2-1/2 weeks. No significant urinary symptoms. No fevers or chills nausea vomiting or diarrhea. Patient has not tried any treatment yet. Her current symptoms are consistent with previous episodes of yeast infections.   Past Medical History  Diagnosis Date  . Hypertension   . Asthma   . Iron deficiency anemia due to chronic blood loss   . Menometrorrhagia   . Psoriasis   . Sleep apnea   . 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 Obesity 12/18/2012  . Depression   . Anxiety   . Bone pain 06/07/2013  . Lymphadenopathy 06/07/2013  . Sarcoidosis 07/26/2013  . Unspecified deficiency anemia 07/26/2013  . Hernia of abdominal cavity 10/25/2013   Past Surgical History  Procedure Laterality Date  . Tubal ligation     History  Substance Use Topics  . Smoking status: Never Smoker   . Smokeless tobacco: Never Used     Comment: Smokes Marijuana (once daily)  . Alcohol Use: 0.0 oz/week    0 Standard drinks or equivalent per week     Comment: all monthly   ROS as above Medications: No current facility-administered medications for this encounter.   Current Outpatient Prescriptions  Medication Sig Dispense Refill  . amLODipine (NORVASC) 10 MG tablet Take 1 tablet (10 mg total) by mouth daily. 30 tablet 1  . Canagliflozin (INVOKANA PO) Take by mouth.    [name] FLUoxetine (PROZAC) 40 MG capsule Take 40 mg by mouth every morning.    [name] guaiFENesin (MUCINEX) 600 MG 12 hr tablet Take 1 tablet (600 mg total) by mouth 2 (two) times daily as needed for cough or to loosen phlegm. 10 tablet 0  . labetalol (NORMODYNE) 100 MG tablet Take 1 tablet (100 mg total) by mouth 2 (two) times daily. 60 tablet 1  . loratadine (CLARITIN) 10 MG tablet Take 10 mg by mouth daily.      [name] losartan-hydrochlorothiazide (HYZAAR) 100-25 MG per tablet Take 1 tablet by mouth daily. 30 tablet 1  . metFORMIN  (GLUCOPHAGE) 1000 MG tablet Take 1,000 mg by mouth 2 (two) times daily with a meal.    . naproxen (NAPROSYN) 500 MG tablet Take 1 tablet (500 mg total) by mouth 2 (two) times daily as needed for mild pain, moderate pain or headache (TAKE WITH MEALS.). 20 tablet 0  . albuterol (PROAIR HFA) 108 (90 BASE) MCG/ACT inhaler Inhale 2 puffs into the lungs every 6 (six) hours as needed for wheezing or shortness of breath. 1 Inhaler 3  . fluconazole (DIFLUCAN) 150 MG tablet Take 1 tablet (150 mg total) by mouth once. 1 tablet 1   No Known Allergies   Exam:  BP 142/87 mmHg  Pulse 93  Temp(Src) 97.4 F (36.3 C) (Oral)  Resp 16  SpO2 96% Gen: Well NAD morbidly obese HEENT: EOMI,  MMM Lungs: Normal work of breathing. CTABL Heart: RRR no MRG Abd: NABS, Soft. Nondistended, Nontender Exts: Brisk capillary refill, warm and well perfused.  GYN: Normal external genitalia. Vaginal canal with thin white discharge. Cervix is difficult to visualize due to body habitus. Nontender.  No results found for this or any previous visit (from the past 24 hour(s)). No results found.  Assessment and Plan: 49 y.o. female with vaginitis likely yeast. Treat with fluconazole. Cytology for gonorrhea Chlamydia trichomonas BV and yeast pending.  Discussed warning signs or symptoms. Please see discharge  instructions. Patient expresses understanding.     Gregor Hams, MD 10/23/14 1031

## 2014-10-23 NOTE — Discharge Instructions (Signed)
Thank you for coming in today. If your belly pain worsens, or you have high fever, bad vomiting, blood in your stool or black tarry stool go to the Emergency Room.   Vaginitis Vaginitis is an inflammation of the vagina. It is most often caused by a change in the normal balance of the bacteria and yeast that live in the vagina. This change in balance causes an overgrowth of certain bacteria or yeast, which causes the inflammation. There are different types of vaginitis, but the most common types are:  Bacterial vaginosis.  Yeast infection (candidiasis).  Trichomoniasis vaginitis. This is a sexually transmitted infection (STI).  Viral vaginitis.  Atropic vaginitis.  Allergic vaginitis. CAUSES  The cause depends on the type of vaginitis. Vaginitis can be caused by:  Bacteria (bacterial vaginosis).  Yeast (yeast infection).  A parasite (trichomoniasis vaginitis)  A virus (viral vaginitis).  Low hormone levels (atrophic vaginitis). Low hormone levels can occur during pregnancy, breastfeeding, or after menopause.  Irritants, such as bubble baths, scented tampons, and feminine sprays (allergic vaginitis). Other factors can change the normal balance of the yeast and bacteria that live in the vagina. These include:  Antibiotic medicines.  Poor hygiene.  Diaphragms, vaginal sponges, spermicides, birth control pills, and intrauterine devices (IUD).  Sexual intercourse.  Infection.  Uncontrolled diabetes.  A weakened immune system. SYMPTOMS  Symptoms can vary depending on the cause of the vaginitis. Common symptoms include:  Abnormal vaginal discharge.  The discharge is white, gray, or yellow with bacterial vaginosis.  The discharge is thick, white, and cheesy with a yeast infection.  The discharge is frothy and yellow or greenish with trichomoniasis.  A bad vaginal odor.  The odor is fishy with bacterial vaginosis.  Vaginal itching, pain, or swelling.  Painful  intercourse.  Pain or burning when urinating. Sometimes, there are no symptoms. TREATMENT  Treatment will vary depending on the type of infection.   Bacterial vaginosis and trichomoniasis are often treated with antibiotic creams or pills.  Yeast infections are often treated with antifungal medicines, such as vaginal creams or suppositories.  Viral vaginitis has no cure, but symptoms can be treated with medicines that relieve discomfort. Your sexual partner should be treated as well.  Atrophic vaginitis may be treated with an estrogen cream, pill, suppository, or vaginal ring. If vaginal dryness occurs, lubricants and moisturizing creams may help. You may be told to avoid scented soaps, sprays, or douches.  Allergic vaginitis treatment involves quitting the use of the product that is causing the problem. Vaginal creams can be used to treat the symptoms. HOME CARE INSTRUCTIONS   Take all medicines as directed by your caregiver.  Keep your genital area clean and dry. Avoid soap and only rinse the area with water.  Avoid douching. It can remove the healthy bacteria in the vagina.  Do not use tampons or have sexual intercourse until your vaginitis has been treated. Use sanitary pads while you have vaginitis.  Wipe from front to back. This avoids the spread of bacteria from the rectum to the vagina.  Let air reach your genital area.  Wear cotton underwear to decrease moisture buildup.  Avoid wearing underwear while you sleep until your vaginitis is gone.  Avoid tight pants and underwear or nylons without a cotton panel.  Take off wet clothing (especially bathing suits) as soon as possible.  Use mild, non-scented products. Avoid using irritants, such as:  Scented feminine sprays.  Fabric softeners.  Scented detergents.  Scented tampons.  Scented  soaps or bubble baths.  Practice safe sex and use condoms. Condoms may prevent the spread of trichomoniasis and viral  vaginitis. SEEK MEDICAL CARE IF:   You have abdominal pain.  You have a fever or persistent symptoms for more than 2-3 days.  You have a fever and your symptoms suddenly get worse. Document Released: 05/12/2007 Document Revised: 04/08/2012 Document Reviewed: 12/26/2011 [facility] Patient Information 2015 [place], [place]. This information is not intended to replace advice given to you by your health care provider. Make sure you discuss any questions you have with your health care provider.

## 2014-10-23 NOTE — ED Notes (Signed)
Reports starting a DM meds.  States one of side effects is yeast infection.  Pt is having vaginal irritation with discharge.  No otc treatments tried.  Symptoms present x 2 wks.

## 2014-10-24 LAB — CERVICOVAGINAL ANCILLARY ONLY
Chlamydia: NEGATIVE
NEISSERIA GONORRHEA: NEGATIVE
WET PREP (BD AFFIRM): POSITIVE — AB

## 2014-10-26 ENCOUNTER — Telehealth (HOSPITAL_COMMUNITY): Payer: Self-pay | Admitting: Family Medicine

## 2014-10-26 MED ORDER — METRONIDAZOLE 500 MG PO TABS: 500.0000 mg | ORAL_TABLET | Freq: Two times a day (BID) | ORAL | Status: DC

## 2014-10-26 NOTE — ED Notes (Signed)
BV positive. Flagyl called in. Pt notified.   Gregor Hams, MD 10/26/14 [phone]

## 2014-11-18 ENCOUNTER — Telehealth: Payer: Self-pay | Admitting: Hematology and Oncology

## 2014-11-18 DIAGNOSIS — I10 Essential (primary) hypertension: Secondary | ICD-10-CM | POA: Diagnosis not present

## 2014-11-18 DIAGNOSIS — N760 Acute vaginitis: Secondary | ICD-10-CM | POA: Diagnosis not present

## 2014-11-18 DIAGNOSIS — E119 Type 2 diabetes mellitus without complications: Secondary | ICD-10-CM | POA: Diagnosis not present

## 2014-11-18 DIAGNOSIS — Z124 Encounter for screening for malignant neoplasm of cervix: Secondary | ICD-10-CM | POA: Diagnosis not present

## 2014-11-18 NOTE — Telephone Encounter (Signed)
s.w. pt and sched appt.....pt ok and aware... °

## 2014-12-05 NOTE — ED Notes (Signed)
Note opened in error  Gregor Hams, MD 12/05/14 [phone]

## 2014-12-15 ENCOUNTER — Other Ambulatory Visit: Payer: Self-pay

## 2014-12-15 ENCOUNTER — Ambulatory Visit: Payer: Self-pay | Admitting: Hematology and Oncology

## 2015-02-09 DIAGNOSIS — J209 Acute bronchitis, unspecified: Secondary | ICD-10-CM | POA: Diagnosis not present

## 2015-02-09 DIAGNOSIS — E119 Type 2 diabetes mellitus without complications: Secondary | ICD-10-CM | POA: Diagnosis not present

## 2015-02-09 DIAGNOSIS — J4520 Mild intermittent asthma, uncomplicated: Secondary | ICD-10-CM | POA: Diagnosis not present

## 2015-02-09 DIAGNOSIS — I10 Essential (primary) hypertension: Secondary | ICD-10-CM | POA: Diagnosis not present

## 2015-02-09 DIAGNOSIS — Z79899 Other long term (current) drug therapy: Secondary | ICD-10-CM | POA: Diagnosis not present

## 2015-02-23 ENCOUNTER — Telehealth: Payer: Self-pay | Admitting: Pulmonary Disease

## 2015-02-23 DIAGNOSIS — G4733 Obstructive sleep apnea (adult) (pediatric): Secondary | ICD-10-CM

## 2015-02-23 NOTE — Telephone Encounter (Signed)
ATC pt. Line continued to ring without VM. WCB.  

## 2015-02-24 DIAGNOSIS — I10 Essential (primary) hypertension: Secondary | ICD-10-CM | POA: Diagnosis not present

## 2015-02-24 DIAGNOSIS — E119 Type 2 diabetes mellitus without complications: Secondary | ICD-10-CM | POA: Diagnosis not present

## 2015-02-24 DIAGNOSIS — J4520 Mild intermittent asthma, uncomplicated: Secondary | ICD-10-CM | POA: Diagnosis not present

## 2015-02-24 DIAGNOSIS — J209 Acute bronchitis, unspecified: Secondary | ICD-10-CM | POA: Diagnosis not present

## 2015-02-24 NOTE — Telephone Encounter (Signed)
Okay to send order. 

## 2015-02-24 NOTE — Telephone Encounter (Signed)
Called and spoke to pt. Pt is a former [facility] pt. Pt requesting nasal pillows for CPAP. Order was placed in 08/2014 but pt never received the nasal pillows. Pt has upcoming OV with VS on 9.1.16 for sleep.   Dr. Halford Chessman please advise if ok to send order to [facility] for nasal pillows. Thanks.

## 2015-02-24 NOTE — Telephone Encounter (Signed)
Order sent to [facility]  [facility]  ATC the pt NA and no option to leave a msg

## 2015-02-26 DIAGNOSIS — G4733 Obstructive sleep apnea (adult) (pediatric): Secondary | ICD-10-CM | POA: Diagnosis not present

## 2015-02-27 NOTE — Telephone Encounter (Signed)
Pt aware of order being placed.  Nothing further needed.  

## 2015-03-13 DIAGNOSIS — G4733 Obstructive sleep apnea (adult) (pediatric): Secondary | ICD-10-CM | POA: Diagnosis not present

## 2015-03-21 ENCOUNTER — Ambulatory Visit: Payer: Self-pay | Admitting: Pulmonary Disease

## 2015-03-30 ENCOUNTER — Ambulatory Visit: Payer: Self-pay | Admitting: Pulmonary Disease

## 2015-05-18 ENCOUNTER — Encounter: Payer: Self-pay | Admitting: Pulmonary Disease

## 2015-05-18 ENCOUNTER — Ambulatory Visit (INDEPENDENT_AMBULATORY_CARE_PROVIDER_SITE_OTHER): Payer: Medicare Other | Admitting: Pulmonary Disease

## 2015-05-18 VITALS — BP 118/70 | HR 87 | Ht 59.0 in | Wt 219.2 lb

## 2015-05-18 DIAGNOSIS — D869 Sarcoidosis, unspecified: Secondary | ICD-10-CM | POA: Diagnosis not present

## 2015-05-18 DIAGNOSIS — J4520 Mild intermittent asthma, uncomplicated: Secondary | ICD-10-CM | POA: Diagnosis not present

## 2015-05-18 DIAGNOSIS — Z6841 Body Mass Index (BMI) 40.0 and over, adult: Secondary | ICD-10-CM

## 2015-05-18 DIAGNOSIS — G4733 Obstructive sleep apnea (adult) (pediatric): Secondary | ICD-10-CM

## 2015-05-18 NOTE — Patient Instructions (Signed)
Follow up in 6 months 

## 2015-05-18 NOTE — Progress Notes (Signed)
Chief Complaint  Patient presents with  . Follow-up    6 mo. f/u for sarcoidosis. pt. states breathing is baseline. SOB with activity. occ. wheezing. prod. cough yellow in color. no chest pain/tightness.    History of Present Illness: Cassandra Burns is a 49 y.o. female with OSA, asthma and sarcoidosis.  She was previously followed by Dr. Gwenette Greet.  Her breathing has been stable.  She uses albuterol 1 or 2 times per week.  She gets occasional cough, wheeze, and sputum.  She was given script for prednisone from her PCP >> she doesn't think she needs prednisone and didn't fill script.  She denies skin rash, joint swelling, leg swelling, or gland swelling.  She uses nasal pillows with her BiPAP.  She has no issues with mask fit.  TESTS: EBUS May 2015 >> granulomas PFT February 2015 >> DLCO 60%  PMhx >> HTN, DM, Depression, anxiety   Past surgical hx, Medications, Allergies, Family hx, Social hx all reviewed.   Physical Exam: BP 118/70 mmHg  Pulse 87  Ht 4\' 11"  (1.499 m)  Wt 219 lb 3.2 oz (99.428 kg)  BMI 44.25 kg/m2  SpO2 97%  General - No distress ENT - No sinus tenderness, no oral exudate, no LAN Cardiac - s1s2 regular, no murmur Chest - No wheeze/rales/dullness Back - No focal tenderness Abd - Soft, non-tender Ext - No edema Neuro - Normal strength Skin - No rashes Psych - normal mood, and behavior   Assessment/Plan:  Obstructive sleep apnea She reports compliance with therapy and benefit from BiPAP. Plan: - continue BiPAP  Mild, intermittent asthma. 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 Plan: - monitor clinically - don't think she needs prednisone at this time  Obesity. Plan: - discussed options to assist with weight loss   Chesley Mires, MD [facility]/Sleep Pager:  [phone]

## 2015-06-30 DIAGNOSIS — G4733 Obstructive sleep apnea (adult) (pediatric): Secondary | ICD-10-CM | POA: Diagnosis not present

## 2015-09-18 ENCOUNTER — Telehealth: Payer: Self-pay | Admitting: *Deleted

## 2015-09-18 NOTE — Telephone Encounter (Signed)
Pt left a message stating she thinks her Hgb/iron is low. Is requesting labs.

## 2015-09-19 ENCOUNTER — Telehealth: Payer: Self-pay | Admitting: Hematology and Oncology

## 2015-09-19 ENCOUNTER — Other Ambulatory Visit: Payer: Self-pay | Admitting: Hematology and Oncology

## 2015-09-19 ENCOUNTER — Encounter: Payer: Self-pay | Admitting: *Deleted

## 2015-09-19 DIAGNOSIS — D500 Iron deficiency anemia secondary to blood loss (chronic): Secondary | ICD-10-CM

## 2015-09-19 NOTE — Telephone Encounter (Signed)
She was multiple no show last year She needs to make a commitment to show up next visit I will place POF but remind her if she no-show again she will be DC

## 2015-09-19 NOTE — Telephone Encounter (Signed)
Pt declined ov/labs per 02/21 POF, pt states no transportation, sent msg to MD/desk nurse confirming another D/T.... Cherylann Banas

## 2015-09-20 ENCOUNTER — Other Ambulatory Visit: Payer: Self-pay

## 2015-09-21 ENCOUNTER — Other Ambulatory Visit: Payer: Self-pay | Admitting: Hematology and Oncology

## 2015-09-21 ENCOUNTER — Telehealth: Payer: Self-pay | Admitting: *Deleted

## 2015-09-21 ENCOUNTER — Telehealth: Payer: Self-pay | Admitting: Hematology and Oncology

## 2015-09-21 NOTE — Telephone Encounter (Signed)
Patient called wanting to reschedule appt and wanted to come tomorrow. Telephone encounter of 2/21 states that message was sent to desk nurse for new date/time. Patient's callback is [phone].

## 2015-09-21 NOTE — Telephone Encounter (Signed)
Per staff message and POF I have scheduled appts. Advised scheduler of appts. JMW  

## 2015-09-21 NOTE — Telephone Encounter (Signed)
I placed POF for labs tomorrow and see me with possible IV feraheme next week

## 2015-09-21 NOTE — Telephone Encounter (Signed)
Spoke with pt to confirm appt date/time per MD 2/23 pof

## 2015-09-22 ENCOUNTER — Other Ambulatory Visit (HOSPITAL_BASED_OUTPATIENT_CLINIC_OR_DEPARTMENT_OTHER): Payer: Medicare Other

## 2015-09-22 DIAGNOSIS — D509 Iron deficiency anemia, unspecified: Secondary | ICD-10-CM | POA: Diagnosis not present

## 2015-09-22 DIAGNOSIS — D500 Iron deficiency anemia secondary to blood loss (chronic): Secondary | ICD-10-CM

## 2015-09-22 LAB — CBC & DIFF AND RETIC
BASO%: 0.5 % (ref 0.0–2.0)
Basophils Absolute: 0 10*3/uL (ref 0.0–0.1)
EOS ABS: 0.1 10*3/uL (ref 0.0–0.5)
EOS%: 2.7 % (ref 0.0–7.0)
HCT: 32.5 % — ABNORMAL LOW (ref 34.8–46.6)
HEMOGLOBIN: 9.6 g/dL — AB (ref 11.6–15.9)
Immature Retic Fract: 18.1 % — ABNORMAL HIGH (ref 1.60–10.00)
LYMPH%: 25.1 % (ref 14.0–49.7)
MCH: 19.1 pg — AB (ref 25.1–34.0)
MCHC: 29.5 g/dL — AB (ref 31.5–36.0)
MCV: 64.6 fL — ABNORMAL LOW (ref 79.5–101.0)
MONO#: 0.6 10*3/uL (ref 0.1–0.9)
MONO%: 13.6 % (ref 0.0–14.0)
NEUT%: 58.1 % (ref 38.4–76.8)
NEUTROS ABS: 2.4 10*3/uL (ref 1.5–6.5)
Platelets: 251 10*3/uL (ref 145–400)
RBC: 5.04 10*6/uL (ref 3.70–5.45)
RDW: 21.1 % — ABNORMAL HIGH (ref 11.2–14.5)
RETIC %: 2.06 % (ref 0.70–2.10)
Retic Ct Abs: 103.82 10*3/uL — ABNORMAL HIGH (ref 33.70–90.70)
WBC: 4.1 10*3/uL (ref 3.9–10.3)
lymph#: 1 10*3/uL (ref 0.9–3.3)

## 2015-09-22 LAB — FERRITIN: Ferritin: 14 ng/ml (ref 9–269)

## 2015-09-25 ENCOUNTER — Other Ambulatory Visit: Payer: Self-pay | Admitting: Hematology and Oncology

## 2015-09-28 ENCOUNTER — Telehealth: Payer: Self-pay | Admitting: Hematology and Oncology

## 2015-09-28 ENCOUNTER — Telehealth: Payer: Self-pay | Admitting: *Deleted

## 2015-09-28 ENCOUNTER — Ambulatory Visit (HOSPITAL_BASED_OUTPATIENT_CLINIC_OR_DEPARTMENT_OTHER): Payer: Medicare Other

## 2015-09-28 ENCOUNTER — Encounter: Payer: Self-pay | Admitting: Hematology and Oncology

## 2015-09-28 ENCOUNTER — Ambulatory Visit (HOSPITAL_BASED_OUTPATIENT_CLINIC_OR_DEPARTMENT_OTHER): Payer: Medicare Other | Admitting: Hematology and Oncology

## 2015-09-28 VITALS — BP 142/76 | HR 98 | Temp 98.0°F | Resp 20

## 2015-09-28 VITALS — BP 134/87 | HR 113 | Temp 98.0°F | Resp 19 | Wt 219.8 lb

## 2015-09-28 DIAGNOSIS — K625 Hemorrhage of anus and rectum: Secondary | ICD-10-CM | POA: Diagnosis not present

## 2015-09-28 DIAGNOSIS — R599 Enlarged lymph nodes, unspecified: Secondary | ICD-10-CM | POA: Diagnosis not present

## 2015-09-28 DIAGNOSIS — D500 Iron deficiency anemia secondary to blood loss (chronic): Secondary | ICD-10-CM

## 2015-09-28 DIAGNOSIS — R590 Localized enlarged lymph nodes: Secondary | ICD-10-CM

## 2015-09-28 MED ORDER — SODIUM CHLORIDE 0.9 % IV SOLN
Freq: Once | INTRAVENOUS | Status: AC
  Administered 2015-09-28: 12:00:00 via INTRAVENOUS

## 2015-09-28 MED ORDER — SODIUM CHLORIDE 0.9 % IV SOLN
510.0000 mg | Freq: Once | INTRAVENOUS | Status: AC
  Administered 2015-09-28: 510 mg via INTRAVENOUS
  Filled 2015-09-28: qty 17

## 2015-09-28 NOTE — Telephone Encounter (Signed)
per pof to sch pt appt-gave pt copy of avs-MW sch fera °

## 2015-09-28 NOTE — Patient Instructions (Signed)

## 2015-09-28 NOTE — Progress Notes (Signed)
[facility] OFFICE PROGRESS NOTE  Philis Fendt, MD SUMMARY OF HEMATOLOGIC HISTORY:  This is a pleasant lady with background history iron deficiency anemia, likely secondary to chronic GI bleed from hemorrhoids. Colonoscopy from 2012 confirmed the diagnosis of internal hemorrhoids. The patient was also found to have lymphadenopathy in the mediastinum and abdomen and she was told she may have diagnosis of sarcoidosis. Bone marrow aspirate and biopsy was nondiagnostic. It did confirmed iron deficiency anemia. 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  INTERVAL HISTORY: Cassandra Burns 50 y.o. female returns for further follow-up. She has not returned for over a year and started complaining of pica with excessive chewing of ice. She also complained of fatigue. She has been taking ibuprofen on a regular basis for nonspecific complaints. The patient denies any recent signs or symptoms of bleeding such as spontaneous epistaxis, hematuria or hematochezia. She has rare intermittent hemorrhoidal bleeding once in a while She denies recent menstruation  I have reviewed the past medical history, past surgical history, social history and family history with the patient and they are unchanged from previous note.  ALLERGIES:  has No Known Allergies.  MEDICATIONS:  Current Outpatient Prescriptions  Medication Sig Dispense Refill  . albuterol (PROAIR HFA) 108 (90 BASE) MCG/ACT inhaler Inhale 2 puffs into the lungs every 6 (six) hours as needed for wheezing or shortness of breath. 1 Inhaler 3  . amLODipine (NORVASC) 10 MG tablet Take 1 tablet (10 mg total) by mouth daily. 30 tablet 1  . Canagliflozin (INVOKANA PO) Take by mouth.    [name] FLUoxetine (PROZAC) 40 MG capsule Take 40 mg by mouth every morning.    [name] guaiFENesin (MUCINEX) 600 MG 12 hr tablet Take 1 tablet (600 mg total) by mouth 2 (two) times daily as  needed for cough or to loosen phlegm. 10 tablet 0  . labetalol (NORMODYNE) 100 MG tablet Take 1 tablet (100 mg total) by mouth 2 (two) times daily. 60 tablet 1  . loratadine (CLARITIN) 10 MG tablet Take 10 mg by mouth daily.      [name] losartan-hydrochlorothiazide (HYZAAR) 100-25 MG per tablet Take 1 tablet by mouth daily. 30 tablet 1  . metFORMIN (GLUCOPHAGE) 1000 MG tablet Take 1,000 mg by mouth 2 (two) times daily with a meal.    . naproxen (NAPROSYN) 500 MG tablet Take 1 tablet (500 mg total) by mouth 2 (two) times daily as needed for mild pain, moderate pain or headache (TAKE WITH MEALS.). 20 tablet 0   No current facility-administered medications for this visit.   Facility-Administered Medications Ordered in Other Visits  Medication Dose Route Frequency Provider Last Rate Last Dose  . ferumoxytol (FERAHEME) 510 mg in sodium chloride 0.9 % 100 mL IVPB  510 mg Intravenous Once Heath Lark, MD         REVIEW OF SYSTEMS:   Constitutional: Denies fevers, chills or night sweats Eyes: Denies blurriness of vision Ears, nose, mouth, throat, and face: Denies mucositis or sore throat Respiratory: Denies cough, dyspnea or wheezes Cardiovascular: Denies palpitation, chest discomfort or lower extremity swelling Gastrointestinal:  Denies nausea, heartburn or change in bowel habits Skin: Denies abnormal skin rashes Lymphatics: Denies new lymphadenopathy or easy bruising Neurological:Denies numbness, tingling or new weaknesses Behavioral/Psych: Mood is stable, no new changes  All other systems were reviewed with the patient and are negative.  PHYSICAL EXAMINATION: ECOG PERFORMANCE STATUS: 0 - Asymptomatic  Filed Vitals:  09/28/15 1107  BP: 134/87  Pulse: 113  Temp: 98 F (36.7 C)  Resp: 19   Filed Weights   09/28/15 1107  Weight: 219 lb 12.8 oz (99.701 kg)    GENERAL:alert, no distress and comfortable. She is morbidly obese SKIN: skin color, texture, turgor are normal, no rashes or  significant lesions EYES: normal, Conjunctiva are pale and non-injected, sclera clear OROPHARYNX:no exudate, no erythema and lips, buccal mucosa, and tongue normal  NECK: supple, thyroid normal size, non-tender, without nodularity LYMPH:  no palpable lymphadenopathy in the cervical, axillary or inguinal LUNGS: clear to auscultation and percussion with normal breathing effort HEART: regular rate & rhythm and no murmurs and no lower extremity edema ABDOMEN:abdomen soft, non-tender and normal bowel sounds Musculoskeletal:no cyanosis of digits and no clubbing  NEURO: alert & oriented x 3 with fluent speech, no focal motor/sensory deficits  LABORATORY DATA:  I have reviewed the data as listed No results found for this or any previous visit (from the past 48 hour(s)).  Lab Results  Component Value Date   WBC 4.1 09/22/2015   HGB 9.6* 09/22/2015   HCT 32.5* 09/22/2015   MCV 64.6* 09/22/2015   PLT 251 09/22/2015   ASSESSMENT & PLAN:  Iron deficiency anemia due to chronic blood loss The most likely cause of her anemia is due to chronic blood loss/malabsorption syndrome. We discussed some of the risks, benefits, and alternatives of intravenous iron infusions. The patient is symptomatic from anemia and the iron level is critically low. She tolerated oral iron supplement poorly and desires to achieved higher levels of iron faster for adequate hematopoesis. Some of the side-effects to be expected including risks of infusion reactions, phlebitis, headaches, nausea and fatigue.  The patient is willing to proceed. Patient education material was dispensed.  Goal is to keep ferritin level greater than 50 I recommend she takes prenatal vitamin for effective every erythropoeisis. I recommended she refrain from taking any aspirin-containing products or NSAID I plan to bring her back in 3 months to repeat blood work and further assessment  Rectal bleeding She continues to have intermittent rectal  bleeding. Her last colonoscopy was several years ago. Recurrence of iron deficiency anemia, I will refer her to see gastroenterologist for further evaluation with plan for possible endoscopy  Mediastinal lymphadenopathy She is currently on observation and she has close follow-up with pulmonologist.     All questions were answered. The patient knows to call the clinic with any problems, questions or concerns. No barriers to learning was detected.  I spent 15 minutes counseling the patient face to face. The total time spent in the appointment was 15 minutes and more than 50% was on counseling.     [facility], Tobias Avitabile, MD 3/2/201711:53 AM

## 2015-09-28 NOTE — Assessment & Plan Note (Signed)
She continues to have intermittent rectal bleeding. Her last colonoscopy was several years ago. Recurrence of iron deficiency anemia, I will refer her to see gastroenterologist for further evaluation with plan for possible endoscopy

## 2015-09-28 NOTE — Assessment & Plan Note (Signed)
She is currently on observation and she has close follow-up with pulmonologist. 

## 2015-09-28 NOTE — Assessment & Plan Note (Addendum)
The most likely cause of her anemia is due to chronic blood loss/malabsorption syndrome. We discussed some of the risks, benefits, and alternatives of intravenous iron infusions. The patient is symptomatic from anemia and the iron level is critically low. She tolerated oral iron supplement poorly and desires to achieved higher levels of iron faster for adequate hematopoesis. Some of the side-effects to be expected including risks of infusion reactions, phlebitis, headaches, nausea and fatigue.  The patient is willing to proceed. Patient education material was dispensed.  Goal is to keep ferritin level greater than 50 I recommend she takes prenatal vitamin for effective every erythropoeisis. I recommended she refrain from taking any aspirin-containing products or NSAID I plan to bring her back in 3 months to repeat blood work and further assessment

## 2015-09-28 NOTE — Telephone Encounter (Signed)
Per staff phone call and POF I have schedueld appts. Scheduler advised of appts.  JMW  

## 2015-10-05 ENCOUNTER — Ambulatory Visit (HOSPITAL_BASED_OUTPATIENT_CLINIC_OR_DEPARTMENT_OTHER): Payer: Medicare Other

## 2015-10-05 VITALS — BP 147/105 | HR 87 | Temp 97.8°F

## 2015-10-05 DIAGNOSIS — K625 Hemorrhage of anus and rectum: Secondary | ICD-10-CM

## 2015-10-05 DIAGNOSIS — D500 Iron deficiency anemia secondary to blood loss (chronic): Secondary | ICD-10-CM | POA: Diagnosis not present

## 2015-10-05 MED ORDER — SODIUM CHLORIDE 0.9 % IV SOLN
Freq: Once | INTRAVENOUS | Status: AC
  Administered 2015-10-05: 10:00:00 via INTRAVENOUS

## 2015-10-05 MED ORDER — SODIUM CHLORIDE 0.9 % IV SOLN
510.0000 mg | Freq: Once | INTRAVENOUS | Status: AC
  Administered 2015-10-05: 510 mg via INTRAVENOUS
  Filled 2015-10-05: qty 17

## 2015-10-05 NOTE — Patient Instructions (Signed)

## 2015-10-09 ENCOUNTER — Other Ambulatory Visit: Payer: Self-pay | Admitting: Hematology and Oncology

## 2015-11-21 ENCOUNTER — Ambulatory Visit: Payer: Self-pay | Admitting: Pulmonary Disease

## 2015-11-24 ENCOUNTER — Ambulatory Visit: Payer: Self-pay | Admitting: Pulmonary Disease

## 2015-12-29 ENCOUNTER — Telehealth: Payer: Self-pay | Admitting: *Deleted

## 2015-12-29 ENCOUNTER — Other Ambulatory Visit (HOSPITAL_BASED_OUTPATIENT_CLINIC_OR_DEPARTMENT_OTHER): Payer: Medicare Other

## 2015-12-29 DIAGNOSIS — D500 Iron deficiency anemia secondary to blood loss (chronic): Secondary | ICD-10-CM | POA: Diagnosis not present

## 2015-12-29 LAB — CBC & DIFF AND RETIC
BASO%: 0.6 % (ref 0.0–2.0)
Basophils Absolute: 0 10*3/uL (ref 0.0–0.1)
EOS%: 3.1 % (ref 0.0–7.0)
Eosinophils Absolute: 0.1 10*3/uL (ref 0.0–0.5)
HCT: 37.9 % (ref 34.8–46.6)
HGB: 11.6 g/dL (ref 11.6–15.9)
IMMATURE RETIC FRACT: 6.6 % (ref 1.60–10.00)
LYMPH#: 1 10*3/uL (ref 0.9–3.3)
LYMPH%: 29 % (ref 14.0–49.7)
MCH: 22.2 pg — ABNORMAL LOW (ref 25.1–34.0)
MCHC: 30.6 g/dL — AB (ref 31.5–36.0)
MCV: 72.5 fL — AB (ref 79.5–101.0)
MONO#: 0.4 10*3/uL (ref 0.1–0.9)
MONO%: 11.1 % (ref 0.0–14.0)
NEUT%: 56.2 % (ref 38.4–76.8)
NEUTROS ABS: 2 10*3/uL (ref 1.5–6.5)
Platelets: 182 10*3/uL (ref 145–400)
RBC: 5.23 10*6/uL (ref 3.70–5.45)
RDW: 17.1 % — ABNORMAL HIGH (ref 11.2–14.5)
Retic %: 1.74 % (ref 0.70–2.10)
Retic Ct Abs: 91 10*3/uL — ABNORMAL HIGH (ref 33.70–90.70)
WBC: 3.6 10*3/uL — ABNORMAL LOW (ref 3.9–10.3)

## 2015-12-29 LAB — FERRITIN: FERRITIN: 116 ng/mL (ref 9–269)

## 2015-12-29 NOTE — Telephone Encounter (Signed)
Pt notified of message below. Is OK with rescheduling for 4 months

## 2015-12-29 NOTE — Telephone Encounter (Signed)
-----   Message from Heath Lark, MD sent at 12/29/2015 10:53 AM EDT ----- Regarding: labs Pls let her know her labs are great, anemia resolved. Normal iron If OK with her I will move her appt to 4 months Please let me know if its Ok and I will place new orders/POF ----- Message -----    From: [facility] Interface    Sent: 12/29/2015   9:28 AM      To: Heath Lark, MD

## 2016-01-04 ENCOUNTER — Telehealth: Payer: Self-pay | Admitting: Hematology and Oncology

## 2016-01-04 ENCOUNTER — Other Ambulatory Visit: Payer: Self-pay | Admitting: Hematology and Oncology

## 2016-01-04 DIAGNOSIS — D500 Iron deficiency anemia secondary to blood loss (chronic): Secondary | ICD-10-CM

## 2016-01-04 NOTE — Telephone Encounter (Signed)
s.w. pt and gv appt for OCT pt ok and aware

## 2016-01-05 ENCOUNTER — Ambulatory Visit: Payer: Self-pay | Admitting: Hematology and Oncology

## 2016-01-05 ENCOUNTER — Other Ambulatory Visit: Payer: Self-pay

## 2016-01-05 ENCOUNTER — Ambulatory Visit: Payer: Self-pay

## 2016-01-26 ENCOUNTER — Other Ambulatory Visit: Payer: Self-pay | Admitting: Internal Medicine

## 2016-01-26 DIAGNOSIS — Z1231 Encounter for screening mammogram for malignant neoplasm of breast: Secondary | ICD-10-CM

## 2016-02-08 ENCOUNTER — Ambulatory Visit
Admission: RE | Admit: 2016-02-08 | Discharge: 2016-02-08 | Disposition: A | Discharge status: Home / Self Care | Payer: Medicare Other | Source: Ambulatory Visit | Attending: Internal Medicine | Admitting: Internal Medicine

## 2016-02-08 DIAGNOSIS — Z1231 Encounter for screening mammogram for malignant neoplasm of breast: Secondary | ICD-10-CM

## 2016-02-16 ENCOUNTER — Encounter: Payer: Self-pay | Admitting: Pulmonary Disease

## 2016-02-16 ENCOUNTER — Ambulatory Visit (INDEPENDENT_AMBULATORY_CARE_PROVIDER_SITE_OTHER): Payer: Medicare Other | Admitting: Pulmonary Disease

## 2016-02-16 VITALS — BP 138/94 | HR 97 | Ht 59.0 in | Wt 207.0 lb

## 2016-02-16 DIAGNOSIS — Z6841 Body Mass Index (BMI) 40.0 and over, adult: Secondary | ICD-10-CM

## 2016-02-16 DIAGNOSIS — J4520 Mild intermittent asthma, uncomplicated: Secondary | ICD-10-CM

## 2016-02-16 DIAGNOSIS — D869 Sarcoidosis, unspecified: Secondary | ICD-10-CM | POA: Diagnosis not present

## 2016-02-16 DIAGNOSIS — G4733 Obstructive sleep apnea (adult) (pediatric): Secondary | ICD-10-CM | POA: Diagnosis not present

## 2016-02-16 DIAGNOSIS — R682 Dry mouth, unspecified: Secondary | ICD-10-CM

## 2016-02-16 NOTE — Patient Instructions (Signed)
Will change your BiPAP setting to 16/12 cm H2O Try adjusting setting on your BiPAP humidifier up  Call if no improvement after few weeks  Follow up in 1 year

## 2016-02-16 NOTE — Progress Notes (Signed)
Current Outpatient Prescriptions on File Prior to Visit  Medication Sig  . albuterol (PROAIR HFA) 108 (90 BASE) MCG/ACT inhaler Inhale 2 puffs into the lungs every 6 (six) hours as needed for wheezing or shortness of breath.  [name] amLODipine (NORVASC) 10 MG tablet Take 1 tablet (10 mg total) by mouth daily.  [name] FLUoxetine (PROZAC) 40 MG capsule Take 40 mg by mouth every morning.  [name] guaiFENesin (MUCINEX) 600 MG 12 hr tablet Take 1 tablet (600 mg total) by mouth 2 (two) times daily as needed for cough or to loosen phlegm.  . labetalol (NORMODYNE) 100 MG tablet Take 1 tablet (100 mg total) by mouth 2 (two) times daily.  [name] loratadine (CLARITIN) 10 MG tablet Take 10 mg by mouth daily.    [name] losartan-hydrochlorothiazide (HYZAAR) 100-25 MG per tablet Take 1 tablet by mouth daily.  . metFORMIN (GLUCOPHAGE) 1000 MG tablet Take 1,000 mg by mouth 2 (two) times daily with a meal.  . naproxen (NAPROSYN) 500 MG tablet Take 1 tablet (500 mg total) by mouth 2 (two) times daily as needed for mild pain, moderate pain or headache (TAKE WITH MEALS.).   No current facility-administered medications on file prior to visit.    Chief Complaint  Patient presents with  . Follow-up    Pt c/o dry mouth from CPAP. Wears nighlty.  Denies any issues with mask/pressure. DME: [employer]    Pulmonary tests EBUS May 2015 >> granulomas PFT February 2015 >> DLCO 60%  Sleep tests BiPAP 01/15/16 to 02/13/16 >> used on 30 of 30 nights with average 8 hrs 31 min.  Average AHI 1.3 with BiPAP 18/15 cm H2O  Past medical history HTN, DM, Depression, anxiety   Past surgical history, Social history, Family history, Allergies reviewed.  Vital signs BP 138/94 mmHg  Pulse 97  Ht 4\' 11"  (1.499 m)  Wt 207 lb (93.895 kg)  BMI 41.79 kg/m2  SpO2 93%  History of Present Illness: Cassandra Burns is a 50 y.o. female with OSA, asthma and sarcoidosis.  Her breathing gets rough in hot weather.  She has trouble with central air in her  apartment.  Her room stays in the 80's even with the air conditioner on.  She has been getting dry mouth with BiPAP.  She has nasal pillows mask.  Download shows good use, but has some airleak.  Physical Exam:  General - No distress ENT - No sinus tenderness, no oral exudate, no LAN Cardiac - s1s2 regular, no murmur Chest - No wheeze/rales/dullness Back - No focal tenderness Abd - Soft, non-tender Ext - No edema Neuro - Normal strength Skin - No rashes Psych - normal mood, and behavior   Assessment/Plan:  Obstructive sleep apnea - She reports compliance with therapy and benefit from BiPAP  Mouth dryness - will change to BiPAP 16/12 cm H2O - will have her adjust temperature on humidifier - if no better, then add chin strap or change to different mask type  Mild, intermittent asthma. - continue prn albuterol  Pulmonary sarcoid. - Does not appear to be active at present. - monitor clinically - don't think she needs prednisone at this time  Obesity. - discussed options to assist with weight loss  Environmental control needs. - I have written a letter for her stating how extreme temperatures can impact her breathing   Patient Instructions  Will change your BiPAP setting to 16/12 cm H2O Try adjusting setting on your BiPAP humidifier up  Call if no improvement after few  weeks  Follow up in 1 year     Chesley Mires, MD [facility]/Sleep Pager:  [phone] 02/16/2016, 10:52 AM

## 2016-05-03 ENCOUNTER — Telehealth: Payer: Self-pay | Admitting: *Deleted

## 2016-05-03 ENCOUNTER — Other Ambulatory Visit: Payer: Self-pay

## 2016-05-03 ENCOUNTER — Other Ambulatory Visit: Payer: Self-pay | Admitting: *Deleted

## 2016-05-03 NOTE — Telephone Encounter (Signed)
Left message for patient to call regarding missed appt. Is scheduled to see Dr Alvy Bimler next Friday with possible infusion. Needs to have labs complete prior to seeing Dr Alvy Bimler

## 2016-05-07 ENCOUNTER — Telehealth: Payer: Self-pay | Admitting: Hematology and Oncology

## 2016-05-07 NOTE — Telephone Encounter (Signed)
Patient missed 10/06 appointment, rescheduled to 10/13 per patient request.

## 2016-05-08 ENCOUNTER — Telehealth: Payer: Self-pay | Admitting: *Deleted

## 2016-05-08 NOTE — Telephone Encounter (Signed)
-----   Message from Heath Lark, MD sent at 05/08/2016  1:19 PM EDT ----- Regarding: labs She will not get treatment the same day of her being seen on Friday I have requested Tammi to call her and get labs to be done ahead of time. She missed her labs last week She has to come in tomorrow so we would have labs back on Friday

## 2016-05-08 NOTE — Telephone Encounter (Signed)
Left detailed message for pt..  Looks like she rescheduled her missed lab appt to be done same day as MD and infusion appt on Friday.  Informed pt labs need to be done at least one day ahead of time so Dr. Alvy Bimler will have results when she see her and we need results to give the Iron.   Please call nurse back to see if she can come in tomorrow for lab.

## 2016-05-09 ENCOUNTER — Telehealth: Payer: Self-pay | Admitting: *Deleted

## 2016-05-09 ENCOUNTER — Other Ambulatory Visit (HOSPITAL_BASED_OUTPATIENT_CLINIC_OR_DEPARTMENT_OTHER): Payer: Medicare Other

## 2016-05-09 DIAGNOSIS — D500 Iron deficiency anemia secondary to blood loss (chronic): Secondary | ICD-10-CM | POA: Diagnosis not present

## 2016-05-09 LAB — CBC & DIFF AND RETIC
BASO%: 0.6 % (ref 0.0–2.0)
BASOS ABS: 0 10*3/uL (ref 0.0–0.1)
EOS%: 2.5 % (ref 0.0–7.0)
Eosinophils Absolute: 0.1 10*3/uL (ref 0.0–0.5)
HEMATOCRIT: 32 % — AB (ref 34.8–46.6)
HGB: 9.7 g/dL — ABNORMAL LOW (ref 11.6–15.9)
IMMATURE RETIC FRACT: 20.3 % — AB (ref 1.60–10.00)
LYMPH#: 1 10*3/uL (ref 0.9–3.3)
LYMPH%: 27.3 % (ref 14.0–49.7)
MCH: 20.6 pg — ABNORMAL LOW (ref 25.1–34.0)
MCHC: 30.3 g/dL — ABNORMAL LOW (ref 31.5–36.0)
MCV: 68.1 fL — AB (ref 79.5–101.0)
MONO#: 0.4 10*3/uL (ref 0.1–0.9)
MONO%: 12.2 % (ref 0.0–14.0)
NEUT#: 2.1 10*3/uL (ref 1.5–6.5)
NEUT%: 57.4 % (ref 38.4–76.8)
PLATELETS: 198 10*3/uL (ref 145–400)
RBC: 4.7 10*6/uL (ref 3.70–5.45)
RDW: 18.9 % — ABNORMAL HIGH (ref 11.2–14.5)
RETIC CT ABS: 106.22 10*3/uL — AB (ref 33.70–90.70)
Retic %: 2.26 % — ABNORMAL HIGH (ref 0.70–2.10)
WBC: 3.6 10*3/uL — ABNORMAL LOW (ref 3.9–10.3)

## 2016-05-09 LAB — FERRITIN: Ferritin: 106 ng/ml (ref 9–269)

## 2016-05-09 NOTE — Telephone Encounter (Signed)
Pt. called in regards to message left on 10/11. Pt. Was informed that she needed to have Labs done before visit with Dr. Alvy Bimler on 10/13. Lab appt was changed to today scheduled for 145 today. Pt. Is aware of appt time for today.

## 2016-05-10 ENCOUNTER — Other Ambulatory Visit: Payer: Self-pay

## 2016-05-10 ENCOUNTER — Ambulatory Visit (HOSPITAL_BASED_OUTPATIENT_CLINIC_OR_DEPARTMENT_OTHER): Payer: Medicare Other | Admitting: Hematology and Oncology

## 2016-05-10 ENCOUNTER — Telehealth: Payer: Self-pay | Admitting: Hematology and Oncology

## 2016-05-10 ENCOUNTER — Encounter: Payer: Self-pay | Admitting: Hematology and Oncology

## 2016-05-10 ENCOUNTER — Ambulatory Visit: Payer: Medicare Other

## 2016-05-10 VITALS — BP 108/103 | HR 102 | Temp 97.9°F | Resp 19 | Wt 209.5 lb

## 2016-05-10 DIAGNOSIS — K625 Hemorrhage of anus and rectum: Secondary | ICD-10-CM

## 2016-05-10 DIAGNOSIS — I10 Essential (primary) hypertension: Secondary | ICD-10-CM | POA: Diagnosis not present

## 2016-05-10 DIAGNOSIS — D500 Iron deficiency anemia secondary to blood loss (chronic): Secondary | ICD-10-CM

## 2016-05-10 NOTE — Telephone Encounter (Signed)
GAVE PATIENT AVS REPORT AND APPOINTMENTS FOR November AND December  °

## 2016-05-10 NOTE — Assessment & Plan Note (Signed)
She continues to have intermittent rectal bleeding. Her last colonoscopy was several years ago. It is less severe than before. Continue conservative management

## 2016-05-10 NOTE — Assessment & Plan Note (Signed)
she will continue current medical management. I recommend close follow-up with primary care doctor for medication adjustment.  

## 2016-05-10 NOTE — Progress Notes (Signed)
[facility] OFFICE PROGRESS NOTE  Philis Fendt, MD SUMMARY OF HEMATOLOGIC HISTORY:  This is a pleasant lady with background history iron deficiency anemia, likely secondary to chronic GI bleed from hemorrhoids. Colonoscopy from 2012 confirmed the diagnosis of internal hemorrhoids. The patient was also found to have lymphadenopathy in the mediastinum and abdomen and she was told she may have diagnosis of sarcoidosis. Bone marrow aspirate and biopsy was nondiagnostic. It did confirmed iron deficiency anemia. 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 INTERVAL HISTORY: Cassandra Burns 50 y.o. female returns for further follow-up. She feels well. She has occasional coughing from sarcoidosis but is not on any treatment. Hemorrhoidal bleeding is still intermittent but not as severe. She denies pica. No chest pain or shortness of breath  I have reviewed the past medical history, past surgical history, social history and family history with the patient and they are unchanged from previous note.  ALLERGIES:  has No Known Allergies.  MEDICATIONS:  Current Outpatient Prescriptions  Medication Sig Dispense Refill  . albuterol (PROAIR HFA) 108 (90 BASE) MCG/ACT inhaler Inhale 2 puffs into the lungs every 6 (six) hours as needed for wheezing or shortness of breath. 1 Inhaler 3  . amLODipine (NORVASC) 10 MG tablet Take 1 tablet (10 mg total) by mouth daily. 30 tablet 1  . FLUoxetine (PROZAC) 40 MG capsule Take 40 mg by mouth every morning.    [name] guaiFENesin (MUCINEX) 600 MG 12 hr tablet Take 1 tablet (600 mg total) by mouth 2 (two) times daily as needed for cough or to loosen phlegm. 10 tablet 0  . labetalol (NORMODYNE) 100 MG tablet Take 1 tablet (100 mg total) by mouth 2 (two) times daily. 60 tablet 1  . loratadine (CLARITIN) 10 MG tablet Take 10 mg by mouth daily.      [name] losartan-hydrochlorothiazide  (HYZAAR) 100-25 MG per tablet Take 1 tablet by mouth daily. 30 tablet 1  . metFORMIN (GLUCOPHAGE) 1000 MG tablet Take 1,000 mg by mouth 2 (two) times daily with a meal.    . naproxen (NAPROSYN) 500 MG tablet Take 1 tablet (500 mg total) by mouth 2 (two) times daily as needed for mild pain, moderate pain or headache (TAKE WITH MEALS.). 20 tablet 0   No current facility-administered medications for this visit.      REVIEW OF SYSTEMS:   Constitutional: Denies fevers, chills or night sweats Eyes: Denies blurriness of vision Ears, nose, mouth, throat, and face: Denies mucositis or sore throat Respiratory: Denies cough, dyspnea or wheezes Cardiovascular: Denies palpitation, chest discomfort or lower extremity swelling Gastrointestinal:  Denies nausea, heartburn or change in bowel habits Skin: Denies abnormal skin rashes Lymphatics: Denies new lymphadenopathy or easy bruising Neurological:Denies numbness, tingling or new weaknesses Behavioral/Psych: Mood is stable, no new changes  All other systems were reviewed with the patient and are negative.  PHYSICAL EXAMINATION: ECOG PERFORMANCE STATUS: 0 - Asymptomatic  Vitals:   05/10/16 1108 05/10/16 1109  BP: (!) 185/108 (!) 108/103  Pulse: (!) 102   Resp: 19   Temp: 97.9 F (36.6 C)    Filed Weights   05/10/16 1108  Weight: 209 lb 8 oz (95 kg)    GENERAL:alert, no distress and comfortable SKIN: skin color, texture, turgor are normal, no rashes or significant lesions EYES: normal, Conjunctiva are pink and non-injected, sclera clear Musculoskeletal:no cyanosis of digits and no clubbing  NEURO: alert & oriented x 3 with  fluent speech, no focal motor/sensory deficits  LABORATORY DATA:  I have reviewed the data as listed     Component Value Date/Time   NA 137 07/13/2014 0245   NA 137 06/07/2013 0917   K 4.4 07/13/2014 0245   K 4.1 06/07/2013 0917   CL 98 07/13/2014 0245   CL 102 09/09/2012 1010   CO2 23 07/13/2014 0245   CO2 23  06/07/2013 0917   GLUCOSE 285 (H) 07/13/2014 0245   GLUCOSE 183 (H) 06/07/2013 0917   GLUCOSE 237 (H) 09/09/2012 1010   BUN 19 07/13/2014 0245   BUN 12.0 06/07/2013 0917   CREATININE 0.85 07/13/2014 0245   CREATININE 0.8 06/07/2013 0917   CALCIUM 9.6 07/13/2014 0245   CALCIUM 10.4 06/07/2013 0917   PROT 8.7 (H) 11/26/2013 0955   PROT 8.5 (H) 06/07/2013 0917   ALBUMIN 3.7 11/26/2013 0955   ALBUMIN 3.5 06/07/2013 0917   AST 23 11/26/2013 0955   AST 17 06/07/2013 0917   ALT 28 11/26/2013 0955   ALT 17 06/07/2013 0917   ALKPHOS 104 11/26/2013 0955   ALKPHOS 96 06/07/2013 0917   BILITOT 0.4 11/26/2013 0955   BILITOT 0.36 06/07/2013 0917   GFRNONAA 80 (L) 07/13/2014 0245   GFRAA >90 07/13/2014 0245    No results found for: SPEP, UPEP  Lab Results  Component Value Date   WBC 3.6 (L) 05/09/2016   NEUTROABS 2.1 05/09/2016   HGB 9.7 (L) 05/09/2016   HCT 32.0 (L) 05/09/2016   MCV 68.1 (L) 05/09/2016   PLT 198 05/09/2016      Chemistry      Component Value Date/Time   NA 137 07/13/2014 0245   NA 137 06/07/2013 0917   K 4.4 07/13/2014 0245   K 4.1 06/07/2013 0917   CL 98 07/13/2014 0245   CL 102 09/09/2012 1010   CO2 23 07/13/2014 0245   CO2 23 06/07/2013 0917   BUN 19 07/13/2014 0245   BUN 12.0 06/07/2013 0917   CREATININE 0.85 07/13/2014 0245   CREATININE 0.8 06/07/2013 0917      Component Value Date/Time   CALCIUM 9.6 07/13/2014 0245   CALCIUM 10.4 06/07/2013 0917   ALKPHOS 104 11/26/2013 0955   ALKPHOS 96 06/07/2013 0917   AST 23 11/26/2013 0955   AST 17 06/07/2013 0917   ALT 28 11/26/2013 0955   ALT 17 06/07/2013 0917   BILITOT 0.4 11/26/2013 0955   BILITOT 0.36 06/07/2013 0917      ASSESSMENT & PLAN:  Iron deficiency anemia due to chronic blood loss She has chronic hemorrhoidal bleeding. Even though she is mildly anemic, she is not iron deficient. She is relatively asymptomatic. For that reason, I will not give her IV iron and reschedule her  appointment to recheck her labs in 6 weeks and to give her iron if needed in the future  Rectal bleeding She continues to have intermittent rectal bleeding. Her last colonoscopy was several years ago. It is less severe than before. Continue conservative management  HTN (hypertension) she will continue current medical management. I recommend close follow-up with primary care doctor for medication adjustment.    All questions were answered. The patient knows to call the clinic with any problems, questions or concerns. No barriers to learning was detected.  I spent 15 minutes counseling the patient face to face. The total time spent in the appointment was 20 minutes and more than 50% was on counseling.     Heath Lark, MD 10/13/20171:51 PM

## 2016-05-10 NOTE — Assessment & Plan Note (Signed)
She has chronic hemorrhoidal bleeding. Even though she is mildly anemic, she is not iron deficient. She is relatively asymptomatic. For that reason, I will not give her IV iron and reschedule her appointment to recheck her labs in 6 weeks and to give her iron if needed in the future

## 2016-06-21 ENCOUNTER — Other Ambulatory Visit: Payer: Self-pay

## 2016-06-24 ENCOUNTER — Other Ambulatory Visit (HOSPITAL_BASED_OUTPATIENT_CLINIC_OR_DEPARTMENT_OTHER): Payer: Medicare Other

## 2016-06-24 DIAGNOSIS — D500 Iron deficiency anemia secondary to blood loss (chronic): Secondary | ICD-10-CM

## 2016-06-24 LAB — FERRITIN: FERRITIN: 108 ng/mL (ref 9–269)

## 2016-06-24 LAB — IRON AND TIBC
%SAT: 12 % — AB (ref 21–57)
IRON: 37 ug/dL — AB (ref 41–142)
TIBC: 324 ug/dL (ref 236–444)
UIBC: 286 ug/dL (ref 120–384)

## 2016-06-24 LAB — CBC & DIFF AND RETIC
BASO%: 0.7 % (ref 0.0–2.0)
Basophils Absolute: 0 10*3/uL (ref 0.0–0.1)
EOS ABS: 0.1 10*3/uL (ref 0.0–0.5)
EOS%: 1.9 % (ref 0.0–7.0)
HCT: 33.3 % — ABNORMAL LOW (ref 34.8–46.6)
HEMOGLOBIN: 10.2 g/dL — AB (ref 11.6–15.9)
IMMATURE RETIC FRACT: 11.4 % — AB (ref 1.60–10.00)
LYMPH%: 28.2 % (ref 14.0–49.7)
MCH: 20.7 pg — ABNORMAL LOW (ref 25.1–34.0)
MCHC: 30.6 g/dL — ABNORMAL LOW (ref 31.5–36.0)
MCV: 67.5 fL — ABNORMAL LOW (ref 79.5–101.0)
MONO#: 0.4 10*3/uL (ref 0.1–0.9)
MONO%: 9 % (ref 0.0–14.0)
NEUT%: 60.2 % (ref 38.4–76.8)
NEUTROS ABS: 2.5 10*3/uL (ref 1.5–6.5)
Platelets: 209 10*3/uL (ref 145–400)
RBC: 4.93 10*6/uL (ref 3.70–5.45)
RDW: 19 % — AB (ref 11.2–14.5)
RETIC %: 1.73 % (ref 0.70–2.10)
Retic Ct Abs: 85.29 10*3/uL (ref 33.70–90.70)
WBC: 4.1 10*3/uL (ref 3.9–10.3)
lymph#: 1.2 10*3/uL (ref 0.9–3.3)

## 2016-06-28 ENCOUNTER — Encounter: Payer: Self-pay | Admitting: Hematology and Oncology

## 2016-06-28 ENCOUNTER — Ambulatory Visit (HOSPITAL_BASED_OUTPATIENT_CLINIC_OR_DEPARTMENT_OTHER): Payer: Medicare Other | Admitting: Hematology and Oncology

## 2016-06-28 ENCOUNTER — Telehealth: Payer: Self-pay | Admitting: *Deleted

## 2016-06-28 ENCOUNTER — Ambulatory Visit (HOSPITAL_BASED_OUTPATIENT_CLINIC_OR_DEPARTMENT_OTHER): Payer: Medicare Other

## 2016-06-28 ENCOUNTER — Telehealth: Payer: Self-pay | Admitting: Hematology and Oncology

## 2016-06-28 VITALS — BP 143/82 | HR 109 | Temp 98.8°F | Resp 18

## 2016-06-28 VITALS — BP 143/94 | HR 113 | Temp 97.6°F | Resp 18 | Ht 59.0 in | Wt 206.6 lb

## 2016-06-28 DIAGNOSIS — D500 Iron deficiency anemia secondary to blood loss (chronic): Secondary | ICD-10-CM | POA: Diagnosis not present

## 2016-06-28 MED ORDER — SODIUM CHLORIDE 0.9 % IV SOLN
Freq: Once | INTRAVENOUS | Status: AC
  Administered 2016-06-28: 12:00:00 via INTRAVENOUS

## 2016-06-28 MED ORDER — SODIUM CHLORIDE 0.9 % IV SOLN
510.0000 mg | Freq: Once | INTRAVENOUS | Status: AC
  Administered 2016-06-28: 510 mg via INTRAVENOUS
  Filled 2016-06-28: qty 17

## 2016-06-28 NOTE — Progress Notes (Signed)
[facility] OFFICE PROGRESS NOTE  Philis Fendt, MD SUMMARY OF HEMATOLOGIC HISTORY:  This is a pleasant lady with background history iron deficiency anemia, likely secondary to chronic GI bleed from hemorrhoids. Colonoscopy from 2012 confirmed the diagnosis of internal hemorrhoids. The patient was also found to have lymphadenopathy in the mediastinum and abdomen and she was told she may have diagnosis of sarcoidosis. Bone marrow aspirate and biopsy was nondiagnostic. It did confirmed iron deficiency anemia. 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 INTERVAL HISTORY: Cassandra Burns 50 y.o. female returns for follow-up. She complained of fatigue and mild pica. She continues to have chronic hemorrhoidal bleeding. Denies recent epistaxis or hematuria. She denies recent cough.  I have reviewed the past medical history, past surgical history, social history and family history with the patient and they are unchanged from previous note.  ALLERGIES:  has No Known Allergies.  MEDICATIONS:  Current Outpatient Prescriptions  Medication Sig Dispense Refill  . albuterol (PROAIR HFA) 108 (90 BASE) MCG/ACT inhaler Inhale 2 puffs into the lungs every 6 (six) hours as needed for wheezing or shortness of breath. 1 Inhaler 3  . amLODipine (NORVASC) 10 MG tablet Take 1 tablet (10 mg total) by mouth daily. 30 tablet 1  . FLUoxetine (PROZAC) 40 MG capsule Take 40 mg by mouth every morning.    [name] guaiFENesin (MUCINEX) 600 MG 12 hr tablet Take 1 tablet (600 mg total) by mouth 2 (two) times daily as needed for cough or to loosen phlegm. 10 tablet 0  . labetalol (NORMODYNE) 100 MG tablet Take 1 tablet (100 mg total) by mouth 2 (two) times daily. 60 tablet 1  . loratadine (CLARITIN) 10 MG tablet Take 10 mg by mouth daily.      [name] losartan-hydrochlorothiazide (HYZAAR) 100-25 MG per tablet Take 1 tablet by mouth daily. 30  tablet 1  . metFORMIN (GLUCOPHAGE) 1000 MG tablet Take 1,000 mg by mouth 2 (two) times daily with a meal.    . naproxen (NAPROSYN) 500 MG tablet Take 1 tablet (500 mg total) by mouth 2 (two) times daily as needed for mild pain, moderate pain or headache (TAKE WITH MEALS.). 20 tablet 0   No current facility-administered medications for this visit.      REVIEW OF SYSTEMS:   Constitutional: Denies fevers, chills or night sweats Eyes: Denies blurriness of vision Ears, nose, mouth, throat, and face: Denies mucositis or sore throat Respiratory: Denies cough, dyspnea or wheezes Cardiovascular: Denies palpitation, chest discomfort or lower extremity swelling Gastrointestinal:  Denies nausea, heartburn or change in bowel habits Skin: Denies abnormal skin rashes Lymphatics: Denies new lymphadenopathy or easy bruising Neurological:Denies numbness, tingling or new weaknesses Behavioral/Psych: Mood is stable, no new changes  All other systems were reviewed with the patient and are negative.  PHYSICAL EXAMINATION: ECOG PERFORMANCE STATUS: 0 - Asymptomatic  Vitals:   06/28/16 1044  BP: (!) 143/94  Pulse: (!) 113  Resp: 18  Temp: 97.6 F (36.4 C)   Filed Weights   06/28/16 1044  Weight: 206 lb 9.6 oz (93.7 kg)    GENERAL:alert, no distress and comfortable SKIN: skin color, texture, turgor are normal, no rashes or significant lesions EYES: normal, Conjunctiva are pink and non-injected, sclera clear Musculoskeletal:no cyanosis of digits and no clubbing  NEURO: alert & oriented x 3 with fluent speech, no focal motor/sensory deficits  LABORATORY DATA:  I have reviewed the data as listed  Component Value Date/Time   NA 137 07/13/2014 0245   NA 137 06/07/2013 0917   K 4.4 07/13/2014 0245   K 4.1 06/07/2013 0917   CL 98 07/13/2014 0245   CL 102 09/09/2012 1010   CO2 23 07/13/2014 0245   CO2 23 06/07/2013 0917   GLUCOSE 285 (H) 07/13/2014 0245   GLUCOSE 183 (H) 06/07/2013 0917    GLUCOSE 237 (H) 09/09/2012 1010   BUN 19 07/13/2014 0245   BUN 12.0 06/07/2013 0917   CREATININE 0.85 07/13/2014 0245   CREATININE 0.8 06/07/2013 0917   CALCIUM 9.6 07/13/2014 0245   CALCIUM 10.4 06/07/2013 0917   PROT 8.7 (H) 11/26/2013 0955   PROT 8.5 (H) 06/07/2013 0917   ALBUMIN 3.7 11/26/2013 0955   ALBUMIN 3.5 06/07/2013 0917   AST 23 11/26/2013 0955   AST 17 06/07/2013 0917   ALT 28 11/26/2013 0955   ALT 17 06/07/2013 0917   ALKPHOS 104 11/26/2013 0955   ALKPHOS 96 06/07/2013 0917   BILITOT 0.4 11/26/2013 0955   BILITOT 0.36 06/07/2013 0917   GFRNONAA 80 (L) 07/13/2014 0245   GFRAA >90 07/13/2014 0245    No results found for: SPEP, UPEP  Lab Results  Component Value Date   WBC 4.1 06/24/2016   NEUTROABS 2.5 06/24/2016   HGB 10.2 (L) 06/24/2016   HCT 33.3 (L) 06/24/2016   MCV 67.5 (L) 06/24/2016   PLT 209 06/24/2016      Chemistry      Component Value Date/Time   NA 137 07/13/2014 0245   NA 137 06/07/2013 0917   K 4.4 07/13/2014 0245   K 4.1 06/07/2013 0917   CL 98 07/13/2014 0245   CL 102 09/09/2012 1010   CO2 23 07/13/2014 0245   CO2 23 06/07/2013 0917   BUN 19 07/13/2014 0245   BUN 12.0 06/07/2013 0917   CREATININE 0.85 07/13/2014 0245   CREATININE 0.8 06/07/2013 0917      Component Value Date/Time   CALCIUM 9.6 07/13/2014 0245   CALCIUM 10.4 06/07/2013 0917   ALKPHOS 104 11/26/2013 0955   ALKPHOS 96 06/07/2013 0917   AST 23 11/26/2013 0955   AST 17 06/07/2013 0917   ALT 28 11/26/2013 0955   ALT 17 06/07/2013 0917   BILITOT 0.4 11/26/2013 0955   BILITOT 0.36 06/07/2013 0917      ASSESSMENT & PLAN:  Iron deficiency anemia due to chronic blood loss The most likely cause of her anemia is due to chronic blood loss/malabsorption syndrome. We discussed some of the risks, benefits, and alternatives of intravenous iron infusions. The patient is symptomatic from anemia and the iron level is critically low. She tolerated oral iron supplement poorly  and desires to achieved higher levels of iron faster for adequate hematopoesis. Some of the side-effects to be expected including risks of infusion reactions, phlebitis, headaches, nausea and fatigue.  The patient is willing to proceed. Patient education material was dispensed.  Goal is to keep ferritin level greater than 50 and resolution of anemia Even though ferritin level was adequate, serum iron and percentage of iron saturation were low, indicated as of component of iron deficiency We will proceed with only 1 dose of IV iron and I plan to recheck her blood work again in 7 months. She agreed    Orders Placed This Encounter  Procedures  . CBC & Diff and Retic    Standing Status:   Future    Standing Expiration Date:   08/02/2017  . Ferritin  Standing Status:   Future    Standing Expiration Date:   08/02/2017  . Iron and TIBC    Standing Status:   Future    Standing Expiration Date:   08/02/2017    All questions were answered. The patient knows to call the clinic with any problems, questions or concerns. No barriers to learning was detected.  I spent 15 minutes counseling the patient face to face. The total time spent in the appointment was 20 minutes and more than 50% was on counseling.     Heath Lark, MD 12/1/201711:00 AM

## 2016-06-28 NOTE — Patient Instructions (Signed)

## 2016-06-28 NOTE — Assessment & Plan Note (Signed)
The most likely cause of her anemia is due to chronic blood loss/malabsorption syndrome. We discussed some of the risks, benefits, and alternatives of intravenous iron infusions. The patient is symptomatic from anemia and the iron level is critically low. She tolerated oral iron supplement poorly and desires to achieved higher levels of iron faster for adequate hematopoesis. Some of the side-effects to be expected including risks of infusion reactions, phlebitis, headaches, nausea and fatigue.  The patient is willing to proceed. Patient education material was dispensed.  Goal is to keep ferritin level greater than 50 and resolution of anemia Even though ferritin level was adequate, serum iron and percentage of iron saturation were low, indicated as of component of iron deficiency We will proceed with only 1 dose of IV iron and I plan to recheck her blood work again in 7 months. She agreed

## 2016-06-28 NOTE — Telephone Encounter (Signed)
Appointments scheduled per 12/1 LOS. Patient given AVS report and calendars with future scheduled appointments. °

## 2016-06-28 NOTE — Telephone Encounter (Signed)
Per LOS I have scheduled appts and notified the scheduler 

## 2017-01-31 ENCOUNTER — Other Ambulatory Visit: Payer: Medicare Other

## 2017-02-05 ENCOUNTER — Other Ambulatory Visit (HOSPITAL_BASED_OUTPATIENT_CLINIC_OR_DEPARTMENT_OTHER): Payer: Medicare HMO

## 2017-02-05 DIAGNOSIS — D500 Iron deficiency anemia secondary to blood loss (chronic): Secondary | ICD-10-CM

## 2017-02-05 LAB — CBC & DIFF AND RETIC
BASO%: 0.6 % (ref 0.0–2.0)
BASOS ABS: 0 10*3/uL (ref 0.0–0.1)
EOS%: 2.7 % (ref 0.0–7.0)
Eosinophils Absolute: 0.1 10*3/uL (ref 0.0–0.5)
HEMATOCRIT: 31.7 % — AB (ref 34.8–46.6)
HEMOGLOBIN: 9.7 g/dL — AB (ref 11.6–15.9)
IMMATURE RETIC FRACT: 15.5 % — AB (ref 1.60–10.00)
LYMPH%: 23.8 % (ref 14.0–49.7)
MCH: 21.1 pg — AB (ref 25.1–34.0)
MCHC: 30.5 g/dL — ABNORMAL LOW (ref 31.5–36.0)
MCV: 69.2 fL — ABNORMAL LOW (ref 79.5–101.0)
MONO#: 0.5 10*3/uL (ref 0.1–0.9)
MONO%: 12.9 % (ref 0.0–14.0)
NEUT#: 2.3 10*3/uL (ref 1.5–6.5)
NEUT%: 60 % (ref 38.4–76.8)
Platelets: 236 10*3/uL (ref 145–400)
RBC: 4.57 10*6/uL (ref 3.70–5.45)
RDW: 20 % — AB (ref 11.2–14.5)
RETIC %: 2.53 % — AB (ref 0.70–2.10)
RETIC CT ABS: 115.62 10*3/uL — AB (ref 33.70–90.70)
WBC: 3.9 10*3/uL (ref 3.9–10.3)
lymph#: 0.9 10*3/uL (ref 0.9–3.3)

## 2017-02-05 LAB — FERRITIN: FERRITIN: 192 ng/mL (ref 9–269)

## 2017-02-05 LAB — IRON AND TIBC
%SAT: 13 % — AB (ref 21–57)
IRON: 40 ug/dL — AB (ref 41–142)
TIBC: 306 ug/dL (ref 236–444)
UIBC: 266 ug/dL (ref 120–384)

## 2017-02-07 ENCOUNTER — Ambulatory Visit (HOSPITAL_BASED_OUTPATIENT_CLINIC_OR_DEPARTMENT_OTHER): Payer: Medicare HMO

## 2017-02-07 ENCOUNTER — Ambulatory Visit (HOSPITAL_BASED_OUTPATIENT_CLINIC_OR_DEPARTMENT_OTHER): Payer: Medicare HMO | Admitting: Hematology and Oncology

## 2017-02-07 ENCOUNTER — Telehealth: Payer: Self-pay | Admitting: Hematology and Oncology

## 2017-02-07 VITALS — BP 176/102 | HR 101 | Temp 97.9°F | Resp 20 | Ht 59.0 in | Wt 203.2 lb

## 2017-02-07 DIAGNOSIS — D500 Iron deficiency anemia secondary to blood loss (chronic): Secondary | ICD-10-CM

## 2017-02-07 DIAGNOSIS — R590 Localized enlarged lymph nodes: Secondary | ICD-10-CM

## 2017-02-07 DIAGNOSIS — I10 Essential (primary) hypertension: Secondary | ICD-10-CM | POA: Diagnosis not present

## 2017-02-07 MED ORDER — FERUMOXYTOL INJECTION 510 MG/17 ML
510.0000 mg | Freq: Once | INTRAVENOUS | Status: AC
  Administered 2017-02-07: 510 mg via INTRAVENOUS
  Filled 2017-02-07: qty 17

## 2017-02-07 MED ORDER — SODIUM CHLORIDE 0.9 % IV SOLN
Freq: Once | INTRAVENOUS | Status: AC
  Administered 2017-02-07: 11:00:00 via INTRAVENOUS

## 2017-02-07 NOTE — Patient Instructions (Signed)

## 2017-02-07 NOTE — Telephone Encounter (Signed)
Scheduled appt per 7/13 los - patient to get new schedule in the treatment area - called back to treatment area before finished schedule.

## 2017-02-08 ENCOUNTER — Encounter: Payer: Self-pay | Admitting: Hematology and Oncology

## 2017-02-08 NOTE — Progress Notes (Signed)
[name] OFFICE PROGRESS [facility]  Cassandra Ebbs, MD SUMMARY OF HEMATOLOGIC HISTORY:  This is a pleasant lady with background history iron deficiency anemia, likely secondary to chronic GI bleed from hemorrhoids. Colonoscopy from 2012 confirmed the diagnosis of internal hemorrhoids. The patient was also found to have lymphadenopathy in the mediastinum and abdomen and she was told she may have diagnosis of sarcoidosis. Bone marrow aspirate and biopsy was nondiagnostic. It did confirmed iron deficiency anemia. 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 She has been receiving intermittent iron infusion for recurrent iron deficiency anemia Further follow-up.  She is accompanied by her granddaughter  INTERVAL HISTORY: Cassandra Burns 51 y.o. female returns for she complain of fatigue.  Denies excessive pica. She denies significant hemorrhoidal bleeding recently She denies chest pain, shortness of breath or cough.  I have reviewed the past medical history, past surgical history, social history and family history with the patient and they are unchanged from previous Burns.  ALLERGIES:  has No Known Allergies.  MEDICATIONS:  Current Outpatient Prescriptions  Medication Sig Dispense Refill  . albuterol (PROAIR HFA) 108 (90 BASE) MCG/ACT inhaler Inhale 2 puffs into the lungs every 6 (six) hours as needed for wheezing or shortness of breath. 1 Inhaler 3  . amLODipine (NORVASC) 10 MG tablet Take 1 tablet (10 mg total) by mouth daily. 30 tablet 1  . FLUoxetine (PROZAC) 40 MG capsule Take 40 mg by mouth every morning.    [name] guaiFENesin (MUCINEX) 600 MG 12 hr tablet Take 1 tablet (600 mg total) by mouth 2 (two) times daily as needed for cough or to loosen phlegm. 10 tablet 0  . labetalol (NORMODYNE) 100 MG tablet Take 1 tablet (100 mg total) by mouth 2 (two) times daily. 60 tablet 1  . loratadine (CLARITIN) 10  MG tablet Take 10 mg by mouth daily.      [name] losartan-hydrochlorothiazide (HYZAAR) 100-25 MG per tablet Take 1 tablet by mouth daily. 30 tablet 1  . metFORMIN (GLUCOPHAGE) 1000 MG tablet Take 1,000 mg by mouth 2 (two) times daily with a meal.    . naproxen (NAPROSYN) 500 MG tablet Take 1 tablet (500 mg total) by mouth 2 (two) times daily as needed for mild pain, moderate pain or headache (TAKE WITH MEALS.). 20 tablet 0   No current facility-administered medications for this visit.      REVIEW OF SYSTEMS:   Constitutional: Denies fevers, chills or night sweats Eyes: Denies blurriness of vision Ears, nose, mouth, throat, and face: Denies mucositis or sore throat Respiratory: Denies cough, dyspnea or wheezes Cardiovascular: Denies palpitation, chest discomfort or lower extremity swelling Gastrointestinal:  Denies nausea, heartburn or change in bowel habits Skin: Denies abnormal skin rashes Lymphatics: Denies new lymphadenopathy or easy bruising Neurological:Denies numbness, tingling or new weaknesses Behavioral/Psych: Mood is stable, no new changes  All other systems were reviewed with the patient and are negative.  PHYSICAL EXAMINATION: ECOG PERFORMANCE STATUS: 1 - Symptomatic but completely ambulatory  Vitals:   02/07/17 0930  BP: (!) 176/102  Pulse: (!) 101  Resp: 20  Temp: 97.9 F (36.6 C)   Filed Weights   02/07/17 0930  Weight: 203 lb 3.2 oz (92.2 kg)    GENERAL:alert, no distress and comfortable.  She is morbidly obese SKIN: skin color, texture, turgor are normal, no rashes or significant lesions EYES: normal, Conjunctiva are pale and non-injected, sclera clear OROPHARYNX:no exudate, no erythema and lips,  buccal mucosa, and tongue normal  NECK: supple, thyroid normal size, non-tender, without nodularity LYMPH:  no palpable lymphadenopathy in the cervical, axillary or inguinal LUNGS: clear to auscultation and percussion with normal breathing effort HEART: regular rate &  rhythm and no murmurs and no lower extremity edema ABDOMEN:abdomen soft, non-tender and normal bowel sounds Musculoskeletal:no cyanosis of digits and no clubbing  NEURO: alert & oriented x 3 with fluent speech, no focal motor/sensory deficits  LABORATORY DATA:  I have reviewed the data as listed     Component Value Date/Time   NA 137 07/13/2014 0245   NA 137 06/07/2013 0917   K 4.4 07/13/2014 0245   K 4.1 06/07/2013 0917   CL 98 07/13/2014 0245   CL 102 09/09/2012 1010   CO2 23 07/13/2014 0245   CO2 23 06/07/2013 0917   GLUCOSE 285 (H) 07/13/2014 0245   GLUCOSE 183 (H) 06/07/2013 0917   GLUCOSE 237 (H) 09/09/2012 1010   BUN 19 07/13/2014 0245   BUN 12.0 06/07/2013 0917   CREATININE 0.85 07/13/2014 0245   CREATININE 0.8 06/07/2013 0917   CALCIUM 9.6 07/13/2014 0245   CALCIUM 10.4 06/07/2013 0917   PROT 8.7 (H) 11/26/2013 0955   PROT 8.5 (H) 06/07/2013 0917   ALBUMIN 3.7 11/26/2013 0955   ALBUMIN 3.5 06/07/2013 0917   AST 23 11/26/2013 0955   AST 17 06/07/2013 0917   ALT 28 11/26/2013 0955   ALT 17 06/07/2013 0917   ALKPHOS 104 11/26/2013 0955   ALKPHOS 96 06/07/2013 0917   BILITOT 0.4 11/26/2013 0955   BILITOT 0.36 06/07/2013 0917   GFRNONAA 80 (L) 07/13/2014 0245   GFRAA >90 07/13/2014 0245    No results found for: SPEP, UPEP  Lab Results  Component Value Date   WBC 3.9 02/05/2017   NEUTROABS 2.3 02/05/2017   HGB 9.7 (L) 02/05/2017   HCT 31.7 (L) 02/05/2017   MCV 69.2 (L) 02/05/2017   PLT 236 02/05/2017      Chemistry      Component Value Date/Time   NA 137 07/13/2014 0245   NA 137 06/07/2013 0917   K 4.4 07/13/2014 0245   K 4.1 06/07/2013 0917   CL 98 07/13/2014 0245   CL 102 09/09/2012 1010   CO2 23 07/13/2014 0245   CO2 23 06/07/2013 0917   BUN 19 07/13/2014 0245   BUN 12.0 06/07/2013 0917   CREATININE 0.85 07/13/2014 0245   CREATININE 0.8 06/07/2013 0917      Component Value Date/Time   CALCIUM 9.6 07/13/2014 0245   CALCIUM 10.4 06/07/2013  0917   ALKPHOS 104 11/26/2013 0955   ALKPHOS 96 06/07/2013 0917   AST 23 11/26/2013 0955   AST 17 06/07/2013 0917   ALT 28 11/26/2013 0955   ALT 17 06/07/2013 0917   BILITOT 0.4 11/26/2013 0955   BILITOT 0.36 06/07/2013 0917       ASSESSMENT & PLAN:  Iron deficiency anemia due to chronic blood loss The most likely cause of her anemia is due to chronic blood loss/malabsorption syndrome. We discussed some of the risks, benefits, and alternatives of intravenous iron infusions. The patient is symptomatic from anemia and the iron level is critically low. She tolerated oral iron supplement poorly and desires to achieved higher levels of iron faster for adequate hematopoesis. Some of the side-effects to be expected including risks of infusion reactions, phlebitis, headaches, nausea and fatigue.  The patient is willing to proceed. Patient education material was dispensed.  Goal is to  keep ferritin level greater than 50 and resolution of anemia  Mediastinal lymphadenopathy She is currently on observation and she has close follow-up with pulmonologist.  Essential hypertension she will continue current medical management. I recommend close follow-up with primary care doctor for medication adjustment.    Orders Placed This Encounter  Procedures  . CBC & Diff and Retic    Standing Status:   Future    Standing Expiration Date:   03/14/2018  . Ferritin    Standing Status:   Future    Standing Expiration Date:   02/07/2018  . Iron and TIBC    Standing Status:   Future    Standing Expiration Date:   03/14/2018    All questions were answered. The patient knows to call the clinic with any problems, questions or concerns. No barriers to learning was detected.  I spent 15 minutes counseling the patient face to face. The total time spent in the appointment was 20 minutes and more than 50% was on counseling.     Heath Lark, MD 7/14/20189:44 AM

## 2017-02-08 NOTE — Assessment & Plan Note (Signed)
she will continue current medical management. I recommend close follow-up with primary care doctor for medication adjustment.  

## 2017-02-08 NOTE — Assessment & Plan Note (Signed)
The most likely cause of her anemia is due to chronic blood loss/malabsorption syndrome. °We discussed some of the risks, benefits, and alternatives of intravenous iron infusions. The patient is symptomatic from anemia and the iron level is critically low. She tolerated oral iron supplement poorly and desires to achieved higher levels of iron faster for adequate hematopoesis. Some of the side-effects to be expected including risks of infusion reactions, phlebitis, headaches, nausea and fatigue.  The patient is willing to proceed. Patient education material was dispensed.  °Goal is to keep ferritin level greater than 50 and resolution of anemia ° °

## 2017-02-08 NOTE — Assessment & Plan Note (Signed)
She is currently on observation and she has close follow-up with pulmonologist.

## 2017-02-21 ENCOUNTER — Ambulatory Visit: Payer: Medicare HMO

## 2017-02-21 ENCOUNTER — Telehealth: Payer: Self-pay

## 2017-02-21 NOTE — Telephone Encounter (Signed)
Pt called that she could not get ahold of her ride and wants to r/s her iron infusion to next week. Routine In basket sent. Molly in infusion informed.

## 2017-02-26 ENCOUNTER — Telehealth: Payer: Self-pay | Admitting: Hematology and Oncology

## 2017-02-26 NOTE — Telephone Encounter (Signed)
Scheduled appt per 7/27 message - patient is aware of appt date and time.

## 2017-03-06 ENCOUNTER — Telehealth: Payer: Self-pay | Admitting: *Deleted

## 2017-03-06 ENCOUNTER — Other Ambulatory Visit: Payer: Self-pay | Admitting: Hematology and Oncology

## 2017-03-06 NOTE — Telephone Encounter (Signed)
-----   Message from Heath Lark, MD sent at 03/06/2017 12:35 PM EDT ----- Regarding: RE: Orders This should be her second dose iron she missed I fixed the supportive plan Oluwaseyi Raffel, please call and remind her of her Rx tomorrow ----- Message ----- From: Wynona Neat, [facility] Sent: 03/06/2017  10:56 AM To: Heath Lark, MD Subject: Orders                                         HI Dr Alvy Bimler,  Ms T does not have any orders in supportive care for [facility] and she is here tomorrow for treatment with no office visit.  Thank you  Arbie Cookey

## 2017-03-06 NOTE — Telephone Encounter (Signed)
Reminded patient of appt tomorrow

## 2017-03-07 ENCOUNTER — Ambulatory Visit (HOSPITAL_BASED_OUTPATIENT_CLINIC_OR_DEPARTMENT_OTHER): Payer: Medicare HMO

## 2017-03-07 VITALS — BP 154/96 | HR 102 | Temp 98.1°F | Resp 18

## 2017-03-07 DIAGNOSIS — D500 Iron deficiency anemia secondary to blood loss (chronic): Secondary | ICD-10-CM | POA: Diagnosis not present

## 2017-03-07 MED ORDER — SODIUM CHLORIDE 0.9 % IV SOLN
Freq: Once | INTRAVENOUS | Status: AC
  Administered 2017-03-07: 13:00:00 via INTRAVENOUS

## 2017-03-07 MED ORDER — SODIUM CHLORIDE 0.9 % IV SOLN
510.0000 mg | Freq: Once | INTRAVENOUS | Status: AC
  Administered 2017-03-07: 510 mg via INTRAVENOUS
  Filled 2017-03-07: qty 17

## 2017-03-07 NOTE — Patient Instructions (Signed)

## 2017-03-20 ENCOUNTER — Other Ambulatory Visit: Payer: Self-pay | Admitting: Internal Medicine

## 2017-03-20 DIAGNOSIS — Z1231 Encounter for screening mammogram for malignant neoplasm of breast: Secondary | ICD-10-CM

## 2017-03-28 ENCOUNTER — Ambulatory Visit
Admission: RE | Admit: 2017-03-28 | Discharge: 2017-03-28 | Disposition: A | Discharge status: Home / Self Care | Payer: Medicare HMO | Source: Ambulatory Visit | Attending: Internal Medicine | Admitting: Internal Medicine

## 2017-03-28 DIAGNOSIS — Z1231 Encounter for screening mammogram for malignant neoplasm of breast: Secondary | ICD-10-CM

## 2017-03-30 ENCOUNTER — Emergency Department (HOSPITAL_COMMUNITY): Payer: Medicare HMO

## 2017-03-30 ENCOUNTER — Emergency Department (HOSPITAL_COMMUNITY)
Admission: EM | Admit: 2017-03-30 | Discharge: 2017-03-30 | Disposition: A | Discharge status: Home / Self Care | Payer: Medicare HMO | Attending: Emergency Medicine | Admitting: Emergency Medicine

## 2017-03-30 ENCOUNTER — Encounter (HOSPITAL_COMMUNITY): Payer: Self-pay | Admitting: *Deleted

## 2017-03-30 DIAGNOSIS — Z79899 Other long term (current) drug therapy: Secondary | ICD-10-CM | POA: Diagnosis not present

## 2017-03-30 DIAGNOSIS — J45909 Unspecified asthma, uncomplicated: Secondary | ICD-10-CM | POA: Diagnosis not present

## 2017-03-30 DIAGNOSIS — D860 Sarcoidosis of lung: Secondary | ICD-10-CM | POA: Diagnosis not present

## 2017-03-30 DIAGNOSIS — E119 Type 2 diabetes mellitus without complications: Secondary | ICD-10-CM | POA: Insufficient documentation

## 2017-03-30 DIAGNOSIS — R109 Unspecified abdominal pain: Secondary | ICD-10-CM

## 2017-03-30 DIAGNOSIS — I10 Essential (primary) hypertension: Secondary | ICD-10-CM | POA: Diagnosis not present

## 2017-03-30 DIAGNOSIS — Z7984 Long term (current) use of oral hypoglycemic drugs: Secondary | ICD-10-CM | POA: Diagnosis not present

## 2017-03-30 LAB — COMPREHENSIVE METABOLIC PANEL
ALBUMIN: 3.7 g/dL (ref 3.5–5.0)
ALT: 22 U/L (ref 14–54)
ANION GAP: 10 (ref 5–15)
AST: 21 U/L (ref 15–41)
Alkaline Phosphatase: 132 U/L — ABNORMAL HIGH (ref 38–126)
BUN: 13 mg/dL (ref 6–20)
CHLORIDE: 100 mmol/L — AB (ref 101–111)
CO2: 25 mmol/L (ref 22–32)
Calcium: 10.7 mg/dL — ABNORMAL HIGH (ref 8.9–10.3)
Creatinine, Ser: 0.74 mg/dL (ref 0.44–1.00)
GFR calc Af Amer: >60 mL/min (ref >60)
GFR calc non Af Amer: >60 mL/min (ref >60)
GLUCOSE: 183 mg/dL — AB (ref 65–99)
POTASSIUM: 3.9 mmol/L (ref 3.5–5.1)
SODIUM: 135 mmol/L (ref 135–145)
TOTAL PROTEIN: 9.6 g/dL — AB (ref 6.5–8.1)
Total Bilirubin: 0.3 mg/dL (ref 0.3–1.2)

## 2017-03-30 LAB — CBC WITH DIFFERENTIAL/PLATELET
Basophils Absolute: 0 10*3/uL (ref 0.0–0.1)
Basophils Relative: 1 %
EOS PCT: 2 %
Eosinophils Absolute: 0.1 10*3/uL (ref 0.0–0.7)
HCT: 32.7 % — ABNORMAL LOW (ref 36.0–46.0)
HEMOGLOBIN: 10.1 g/dL — AB (ref 12.0–15.0)
LYMPHS ABS: 1.1 10*3/uL (ref 0.7–4.0)
LYMPHS PCT: 21 %
MCH: 22.6 pg — AB (ref 26.0–34.0)
MCHC: 30.9 g/dL (ref 30.0–36.0)
MCV: 73.2 fL — AB (ref 78.0–100.0)
Monocytes Absolute: 0.4 10*3/uL (ref 0.1–1.0)
Monocytes Relative: 8 %
Neutro Abs: 3.6 10*3/uL (ref 1.7–7.7)
Neutrophils Relative %: 68 %
Platelets: 245 10*3/uL (ref 150–400)
RBC: 4.47 MIL/uL (ref 3.87–5.11)
RDW: 19.7 % — ABNORMAL HIGH (ref 11.5–15.5)
WBC: 5.3 10*3/uL (ref 4.0–10.5)

## 2017-03-30 LAB — URINALYSIS, ROUTINE W REFLEX MICROSCOPIC
Bilirubin Urine: NEGATIVE
Glucose, UA: NEGATIVE mg/dL
HGB URINE DIPSTICK: NEGATIVE
Ketones, ur: NEGATIVE mg/dL
LEUKOCYTES UA: NEGATIVE
NITRITE: NEGATIVE
Protein, ur: 30 mg/dL — AB
SPECIFIC GRAVITY, URINE: 1.02 (ref 1.005–1.030)
pH: 5 (ref 5.0–8.0)

## 2017-03-30 LAB — LIPASE, BLOOD: Lipase: 31 U/L (ref 11–51)

## 2017-03-30 MED ORDER — IOPAMIDOL (ISOVUE-300) INJECTION 61%
100.0000 mL | Freq: Once | INTRAVENOUS | Status: AC | PRN
Start: 2017-03-30 — End: 2017-03-30
  Administered 2017-03-30: 100 mL via INTRAVENOUS

## 2017-03-30 MED ORDER — AMLODIPINE BESYLATE 5 MG PO TABS
10.0000 mg | ORAL_TABLET | Freq: Once | ORAL | Status: AC
  Administered 2017-03-30: 10 mg via ORAL
  Filled 2017-03-30: qty 2

## 2017-03-30 MED ORDER — ALBUTEROL SULFATE HFA 108 (90 BASE) MCG/ACT IN AERS: 1.0000 | INHALATION_SPRAY | Freq: Four times a day (QID) | RESPIRATORY_TRACT | 0 refills | Status: DC | PRN

## 2017-03-30 MED ORDER — MORPHINE SULFATE (PF) 4 MG/ML IV SOLN
4.0000 mg | Freq: Once | INTRAVENOUS | Status: AC
  Administered 2017-03-30: 4 mg via INTRAVENOUS
  Filled 2017-03-30: qty 1

## 2017-03-30 MED ORDER — OXYCODONE-ACETAMINOPHEN 5-325 MG PO TABS: 1.0000 | ORAL_TABLET | Freq: Three times a day (TID) | ORAL | 0 refills | Status: DC | PRN

## 2017-03-30 MED ORDER — IPRATROPIUM-ALBUTEROL 0.5-2.5 (3) MG/3ML IN SOLN
3.0000 mL | Freq: Once | RESPIRATORY_TRACT | Status: AC
  Administered 2017-03-30: 3 mL via RESPIRATORY_TRACT
  Filled 2017-03-30: qty 3

## 2017-03-30 MED ORDER — LOSARTAN POTASSIUM 50 MG PO TABS
100.0000 mg | ORAL_TABLET | Freq: Once | ORAL | Status: AC
  Administered 2017-03-30: 100 mg via ORAL
  Filled 2017-03-30: qty 2

## 2017-03-30 NOTE — ED Provider Notes (Signed)
[facility] DEPT Provider Note   CSN: [id number] Arrival date & time: 03/30/17  0755     History   Chief Complaint Chief Complaint  Patient presents with  . Abdominal Pain    left side    HPI Cassandra Burns is a 51 y.o. female.  HPI  Patient, with past medical history of diabetes, hypertension, sarcoidosis presents to ED for evaluation of left-sided abdominal pain for the past 24 hours. States that the pain is sharp, 10/10 and radiates to her left flank. She has tried icy hot and Tylenol with no relief in her symptoms. She denies any previous history of similar symptoms. She also reports associated nausea. She denies any dysuria, hematuria, vomiting, vaginal discharge. She has been unable to take her medications this morning due to nausea. She denies any past abdominal surgeries. States last bowel movement was 2 days ago.  Past Medical History:  Diagnosis Date  . Anxiety   . Asthma   . Bone pain 06/07/2013  . Depression   . 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 Hernia of abdominal cavity 10/25/2013  . Hypertension   . Iron deficiency anemia due to chronic blood loss   . Lymphadenopathy 06/07/2013  . Menometrorrhagia   . Obesity 12/18/2012  . Psoriasis   . Sarcoidosis 07/26/2013  . Sleep apnea   . Unspecified deficiency anemia 07/26/2013    Patient Active Problem List   Diagnosis Date Noted  . Cough   . Hypertensive emergency 07/11/2014  . HTN (hypertension)   . Pulmonary edema   . SOB (shortness of breath)   . Hypertensive urgency 07/10/2014  . Rectal bleeding 04/14/2014  . Diastasis recti 11/04/2013  . Hernia of abdominal cavity 10/25/2013  . Sarcoidosis 07/26/2013  . Unspecified deficiency anemia 07/26/2013  . Bone pain 06/07/2013  . Lymphadenopathy 06/07/2013  . Obesity hypoventilation syndrome 04/06/2013  . Depression, major, recurrent, moderate ([place]) 01/19/2013  . Generalized anxiety disorder 01/19/2013  . OSA (obstructive sleep apnea)  11/19/2012  . Mediastinal lymphadenopathy 11/03/2012  . DOE (dyspnea on exertion) 11/03/2012  . DM (diabetes mellitus) ([place]) 11/07/2011  . Morbid obesity ([place]) 11/07/2011  . Menometrorrhagia   . Iron deficiency anemia due to chronic blood loss   . Essential hypertension     Past Surgical History:  Procedure Laterality Date  . TUBAL LIGATION      OB History    No data available       Home Medications    Prior to Admission medications   Medication Sig Start Date End Date Taking? Authorizing Provider  acetaminophen (TYLENOL) 500 MG tablet Take 500 mg by mouth every 6 (six) hours as needed for mild pain.   Yes [provider]  amLODipine (NORVASC) 10 MG tablet Take 1 tablet (10 mg total) by mouth daily. 07/13/14  Yes Ollis, Brandi L, NP  losartan-hydrochlorothiazide (HYZAAR) 100-25 MG per tablet Take 1 tablet by mouth daily. 07/13/14  Yes Ollis, Cleaster Corin, NP  metFORMIN (GLUCOPHAGE) 1000 MG tablet Take 1,000 mg by mouth daily with breakfast.    Yes [provider]  albuterol (PROVENTIL HFA;VENTOLIN HFA) 108 (90 Base) MCG/ACT inhaler Inhale 1-2 puffs into the lungs every 6 (six) hours as needed for wheezing or shortness of breath. 03/30/17   Kyrel Leighton, PA-C  guaiFENesin (MUCINEX) 600 MG 12 hr tablet Take 1 tablet (600 mg total) by mouth 2 (two) times daily as needed for cough or to loosen phlegm. Patient not taking: Reported on 03/30/2017 07/13/14  Donita Brooks, NP  labetalol (NORMODYNE) 100 MG tablet Take 1 tablet (100 mg total) by mouth 2 (two) times daily. Patient not taking: Reported on 03/30/2017 07/13/14   Donita Brooks, NP  naproxen (NAPROSYN) 500 MG tablet Take 1 tablet (500 mg total) by mouth 2 (two) times daily as needed for mild pain, moderate pain or headache (TAKE WITH MEALS.). Patient not taking: Reported on 03/30/2017 07/13/14   Donita Brooks, NP  oxyCODONE-acetaminophen (PERCOCET/ROXICET) 5-325 MG tablet Take 1 tablet by mouth every 8 (eight) hours  as needed for severe pain. 03/30/17   Delia Heady, PA-C    Family History Family History  Problem Relation Age of Onset  . Diabetes type II Father   . Asthma Father   . Hypertension Father   . Heart murmur Father   . Alcohol abuse Father   . Drug abuse Father   . Hypertension Mother   . Alcohol abuse Mother   . Drug abuse Mother     Social History Social History  Substance Use Topics  . Smoking status: Never Smoker  . Smokeless tobacco: Never Used     Comment: Smokes Marijuana (once daily)  . Alcohol use 0.0 oz/week     Comment: all monthly     Allergies   Patient has no known allergies.   Review of Systems Review of Systems  Constitutional: Negative for appetite change, chills and fever.  HENT: Negative for ear pain, rhinorrhea, sneezing and sore throat.   Eyes: Negative for photophobia and visual disturbance.  Respiratory: Negative for cough, chest tightness, shortness of breath and wheezing.   Cardiovascular: Negative for chest pain and palpitations.  Gastrointestinal: Positive for abdominal pain and nausea. Negative for blood in stool, constipation, diarrhea and vomiting.  Genitourinary: Negative for dysuria, hematuria and urgency.  Musculoskeletal: Negative for myalgias.  Skin: Negative for rash.  Neurological: Negative for dizziness, weakness and light-headedness.     Physical Exam Updated Vital Signs BP (!) 152/93 (BP Location: Left Arm)   Pulse 99   Temp 97.8 F (36.6 C) (Oral)   Resp 18   Ht 4\' 11"  (1.499 m)   Wt 90.7 kg (200 lb)   SpO2 93%   BMI 40.40 kg/m   Physical Exam  Constitutional: She appears well-developed and well-nourished. No distress.  Appears uncomfortable.  HENT:  Head: Normocephalic and atraumatic.  Nose: Nose normal.  Eyes: Conjunctivae and EOM are normal. Left eye exhibits no discharge. No scleral icterus.  Neck: Normal range of motion. Neck supple.  Cardiovascular: Regular rhythm, normal heart sounds and intact distal  pulses.  Tachycardia present.  Exam reveals no gallop and no friction rub.   No murmur heard. Pulmonary/Chest: Effort normal. No respiratory distress.  Coarse breath sounds diffusely.  Abdominal: Soft. Bowel sounds are normal. She exhibits no distension. There is tenderness. There is no guarding.  Left-sided abdominal tenderness and left CVA tenderness.  Musculoskeletal: Normal range of motion. She exhibits no edema.  Neurological: She is alert. She exhibits normal muscle tone. Coordination normal.  Skin: Skin is warm and dry. No rash noted.  Psychiatric: She has a normal mood and affect.  Nursing note and vitals reviewed.    ED Treatments / Results  Labs (all labs ordered are listed, but only abnormal results are displayed) Labs Reviewed  COMPREHENSIVE METABOLIC PANEL - Abnormal; Notable for the following:       Result Value   Chloride 100 (*)    Glucose, Bld 183 (*)  Calcium 10.7 (*)    Total Protein 9.6 (*)    Alkaline Phosphatase 132 (*)    All other components within normal limits  URINALYSIS, ROUTINE W REFLEX MICROSCOPIC - Abnormal; Notable for the following:    APPearance HAZY (*)    Protein, ur 30 (*)    Bacteria, UA RARE (*)    Squamous Epithelial / LPF 6-30 (*)    All other components within normal limits  CBC WITH DIFFERENTIAL/PLATELET - Abnormal; Notable for the following:    Hemoglobin 10.1 (*)    HCT 32.7 (*)    MCV 73.2 (*)    MCH 22.6 (*)    RDW 19.7 (*)    All other components within normal limits  LIPASE, BLOOD    EKG  EKG Interpretation None       Radiology Ct Abdomen Pelvis W Contrast  Result Date: 03/30/2017 CLINICAL DATA:  Left abdominal pain radiating to the back. History of sarcoidosis. EXAM: CT ABDOMEN AND PELVIS WITH CONTRAST TECHNIQUE: Multidetector CT imaging of the abdomen and pelvis was performed using the standard protocol following bolus administration of intravenous contrast. CONTRAST:  152mL ISOVUE-300 IOPAMIDOL (ISOVUE-300)  INJECTION 61% COMPARISON:  10/15/2013 PET-CT.  06/18/2013 CT abdomen/ pelvis. FINDINGS: Lower chest: Chronic mosaic attenuation at the lung bases. Patchy fine nodularity in the peripheral right middle and right lower lobes, which appears slightly increased since 06/18/2013. Hepatobiliary: Mild hepatomegaly. No definite liver surface irregularity. No liver mass. Normal gallbladder with no radiopaque cholelithiasis. No biliary ductal dilatation. Pancreas: Mildly heterogeneous fatty infiltration in the pancreas, with no pancreatic mass or duct dilation. Spleen: New moderate splenomegaly (craniocaudal splenic length 16.3 cm, previously 12.7 cm on 06/18/2013). Patchy confluent hypodensity throughout the peripheral spleen is new since 06/18/2013. Adrenals/Urinary Tract: Normal adrenals. Normal kidneys with no hydronephrosis and no renal mass. Normal bladder. Stomach/Bowel: Grossly normal stomach. Normal caliber small bowel with no small bowel wall thickening. Normal appendix. Normal large bowel with no diverticulosis, large bowel wall thickening or pericolonic fat stranding. Vascular/Lymphatic: Atherosclerotic nonaneurysmal abdominal aorta. Patent portal, splenic, hepatic and renal veins. Enlarged 1.6 cm porta hepatis node (series 2/ image 22), previously 1.5 cm, minimally increased. Enlarged 1.6 cm portacaval node (series 2/ image 25), stable. Newly mildly enlarged 1.0 cm left para-aortic node (series 2/ image 30). Mild left external iliac adenopathy measuring up to 1.4 cm (series 2/ image 61), previously 1.3 cm, not appreciably changed. Reproductive: Grossly stable enlarged myomatous uterus with several coarsely calcified fibroids. No adnexal mass. Other: No pneumoperitoneum, ascites or focal fluid collection. Musculoskeletal: No aggressive appearing focal osseous lesions. Mild-to-moderate thoracolumbar spondylosis. IMPRESSION: 1. New moderate splenomegaly. New patchy confluent hypodensity throughout the peripheral  spleen. Chronic retroperitoneal and left pelvic lymphadenopathy is stable to mildly increased. These findings may represent progression of abdominopelvic sarcoidosis, although a lymphoproliferative disorder is difficult to exclude. 2. Patchy fine nodularity in the peripheral right lung base, which appears slightly increased since 06/18/2013, and may represent progression of pulmonary sarcoidosis. Consider high-resolution chest CT for further evaluation. 3.  Aortic Atherosclerosis (ICD10-I70.0). Electronically Signed   By: Ilona Sorrel M.D.   On: 03/30/2017 15:43    Procedures Procedures (including critical care time)  Medications Ordered in ED Medications  ipratropium-albuterol (DUONEB) 0.5-2.5 (3) MG/3ML nebulizer solution 3 mL (not administered)  morphine 4 MG/ML injection 4 mg (4 mg Intravenous Given 03/30/17 1259)  amLODipine (NORVASC) tablet 10 mg (10 mg Oral Given 03/30/17 1255)  losartan (COZAAR) tablet 100 mg (100 mg Oral Given 03/30/17 1255)  iopamidol (ISOVUE-300) 61 % injection 100 mL (100 mLs Intravenous Contrast Given 03/30/17 1506)     Initial Impression / Assessment and Plan / ED Course  I have reviewed the triage vital signs and the nursing notes.  Pertinent labs & imaging results that were available during my care of the patient were reviewed by me and considered in my medical decision making (see chart for details).     Patient presents to ED for evaluation of left-sided abdominal pain for the past 24 hours. Pain is located on the left side of her abdomen and radiates to her left flank area. She denies any urinary symptoms, vomiting or vaginal complaints. She denies any chest pain or shortness of breath. Heart rate and blood pressure elevated here in the ED. Patient states that she has not taken her blood pressure medications this morning due to her nausea. She is tender to palpation as indicated above. She is afebrile with no history of fever. CBC, CMP unremarkable. Normal white  count noted. Lipase normal. Urinalysis with no evidence of UTI. CT of the abdomen showed new moderate splenomegaly with findings representing progression of abdominopelvic sarcoidosis as well as pulmonary sarcoidosis. Patient given DuoNeb here in the ED to help with breathing. Pain controlled here with morphine, fluids. We'll discharge with pain medication and albuterol to be taken as needed. Encourage patient to follow-up with PCP in the next 1-2 days for further evaluation and medication management with these new findings on CT. Patient appears stable for discharge at this time. Strict return precautions given.  Final Clinical Impressions(s) / ED Diagnoses   Final diagnoses:  Sarcoidosis of lung (HCC)  Flank pain    New Prescriptions New Prescriptions   ALBUTEROL (PROVENTIL HFA;VENTOLIN HFA) 108 (90 BASE) MCG/ACT INHALER    Inhale 1-2 puffs into the lungs every 6 (six) hours as needed for wheezing or shortness of breath.   OXYCODONE-ACETAMINOPHEN (PERCOCET/ROXICET) 5-325 MG TABLET    Take 1 tablet by mouth every 8 (eight) hours as needed for severe pain.     Delia Heady, PA-C 03/30/17 1614    Tegeler, Gwenyth Allegra, MD 03/31/17 [phone]

## 2017-03-30 NOTE — ED Triage Notes (Signed)
Patient is alert and oriented x4.  She is being seen for left sided abdominal pain that radiates to her left back.  Patient sates that she has never had this issue before and currently rates her pain 10 of 10.

## 2017-03-30 NOTE — Discharge Instructions (Signed)
Please read attached information regarding your condition. Take pain medications as needed for abdominal pain. Use albuterol inhaler as needed. Follow-up with PCP for further evaluation in the next 1-2 days. Return to ED for worsening abdominal pain, lightheadedness, loss of consciousness, trouble breathing, chest pain, coughing up blood.

## 2017-04-01 ENCOUNTER — Other Ambulatory Visit: Payer: Self-pay | Admitting: Internal Medicine

## 2017-04-01 DIAGNOSIS — R928 Other abnormal and inconclusive findings on diagnostic imaging of breast: Secondary | ICD-10-CM

## 2017-04-08 ENCOUNTER — Ambulatory Visit
Admission: RE | Admit: 2017-04-08 | Discharge: 2017-04-08 | Disposition: A | Discharge status: Home / Self Care | Payer: Medicare HMO | Source: Ambulatory Visit | Attending: Internal Medicine | Admitting: Internal Medicine

## 2017-04-08 ENCOUNTER — Other Ambulatory Visit: Payer: Self-pay | Admitting: Internal Medicine

## 2017-04-08 DIAGNOSIS — R599 Enlarged lymph nodes, unspecified: Secondary | ICD-10-CM

## 2017-04-08 DIAGNOSIS — R928 Other abnormal and inconclusive findings on diagnostic imaging of breast: Secondary | ICD-10-CM

## 2017-04-10 ENCOUNTER — Other Ambulatory Visit (HOSPITAL_COMMUNITY)
Admission: RE | Admit: 2017-04-10 | Discharge: 2017-04-10 | Disposition: A | Discharge status: Home / Self Care | Payer: Medicare HMO | Source: Ambulatory Visit | Attending: Internal Medicine | Admitting: Internal Medicine

## 2017-04-10 ENCOUNTER — Other Ambulatory Visit: Payer: Self-pay | Admitting: Internal Medicine

## 2017-04-10 ENCOUNTER — Ambulatory Visit
Admission: RE | Admit: 2017-04-10 | Discharge: 2017-04-10 | Disposition: A | Discharge status: Home / Self Care | Payer: Medicare HMO | Source: Ambulatory Visit | Attending: Internal Medicine | Admitting: Internal Medicine

## 2017-04-10 DIAGNOSIS — R590 Localized enlarged lymph nodes: Secondary | ICD-10-CM | POA: Diagnosis present

## 2017-04-10 DIAGNOSIS — R599 Enlarged lymph nodes, unspecified: Secondary | ICD-10-CM

## 2017-04-21 ENCOUNTER — Ambulatory Visit (INDEPENDENT_AMBULATORY_CARE_PROVIDER_SITE_OTHER): Payer: Medicare HMO | Admitting: Pulmonary Disease

## 2017-04-21 ENCOUNTER — Other Ambulatory Visit (INDEPENDENT_AMBULATORY_CARE_PROVIDER_SITE_OTHER): Payer: Medicare HMO

## 2017-04-21 ENCOUNTER — Encounter: Payer: Self-pay | Admitting: Pulmonary Disease

## 2017-04-21 VITALS — BP 156/98 | HR 102 | Ht 59.0 in | Wt 199.4 lb

## 2017-04-21 DIAGNOSIS — Z6841 Body Mass Index (BMI) 40.0 and over, adult: Secondary | ICD-10-CM | POA: Diagnosis not present

## 2017-04-21 DIAGNOSIS — D869 Sarcoidosis, unspecified: Secondary | ICD-10-CM

## 2017-04-21 DIAGNOSIS — G4733 Obstructive sleep apnea (adult) (pediatric): Secondary | ICD-10-CM

## 2017-04-21 DIAGNOSIS — J4520 Mild intermittent asthma, uncomplicated: Secondary | ICD-10-CM

## 2017-04-21 LAB — COMPREHENSIVE METABOLIC PANEL
ALT: 8 U/L (ref 0–35)
AST: 12 U/L (ref 0–37)
Albumin: 4 g/dL (ref 3.5–5.2)
Alkaline Phosphatase: 161 U/L — ABNORMAL HIGH (ref 39–117)
BUN: 13 mg/dL (ref 6–23)
CALCIUM: 10.5 mg/dL (ref 8.4–10.5)
CHLORIDE: 103 meq/L (ref 96–112)
CO2: 26 meq/L (ref 19–32)
Creatinine, Ser: 0.82 mg/dL (ref 0.40–1.20)
GFR: 94.25 mL/min (ref >60.00)
GLUCOSE: 175 mg/dL — AB (ref 70–99)
POTASSIUM: 4.1 meq/L (ref 3.5–5.1)
Sodium: 138 mEq/L (ref 135–145)
Total Bilirubin: 0.5 mg/dL (ref 0.2–1.2)
Total Protein: 9.2 g/dL — ABNORMAL HIGH (ref 6.0–8.3)

## 2017-04-21 LAB — CBC WITH DIFFERENTIAL/PLATELET
BASOS ABS: 0 10*3/uL (ref 0.0–0.1)
BASOS PCT: 0.4 % (ref 0.0–3.0)
EOS PCT: 2.7 % (ref 0.0–5.0)
Eosinophils Absolute: 0.1 10*3/uL (ref 0.0–0.7)
HEMATOCRIT: 32.6 % — AB (ref 36.0–46.0)
Hemoglobin: 9.9 g/dL — ABNORMAL LOW (ref 12.0–15.0)
LYMPHS ABS: 1 10*3/uL (ref 0.7–4.0)
LYMPHS PCT: 20.6 % (ref 12.0–46.0)
MCHC: 30.4 g/dL (ref 30.0–36.0)
MCV: 74.9 fl — AB (ref 78.0–100.0)
MONOS PCT: 12.6 % — AB (ref 3.0–12.0)
Monocytes Absolute: 0.6 10*3/uL (ref 0.1–1.0)
NEUTROS ABS: 3.2 10*3/uL (ref 1.4–7.7)
Neutrophils Relative %: 63.7 % (ref 43.0–77.0)
PLATELETS: 258 10*3/uL (ref 150.0–400.0)
RBC: 4.36 Mil/uL (ref 3.87–5.11)
RDW: 19.6 % — ABNORMAL HIGH (ref 11.5–15.5)
WBC: 5.1 10*3/uL (ref 4.0–10.5)

## 2017-04-21 LAB — SEDIMENTATION RATE: Sed Rate: 130 mm/hr — ABNORMAL HIGH (ref 0–30)

## 2017-04-21 MED ORDER — PREDNISONE 20 MG PO TABS: 40.0000 mg | ORAL_TABLET | Freq: Every day | ORAL | 1 refills | Status: DC

## 2017-04-21 NOTE — Progress Notes (Signed)
Current Outpatient Prescriptions on File Prior to Visit  Medication Sig  . acetaminophen (TYLENOL) 500 MG tablet Take 500 mg by mouth every 6 (six) hours as needed for mild pain.  [name] albuterol (PROVENTIL HFA;VENTOLIN HFA) 108 (90 Base) MCG/ACT inhaler Inhale 1-2 puffs into the lungs every 6 (six) hours as needed for wheezing or shortness of breath.  [name] amLODipine (NORVASC) 10 MG tablet Take 1 tablet (10 mg total) by mouth daily.  [name] labetalol (NORMODYNE) 100 MG tablet Take 1 tablet (100 mg total) by mouth 2 (two) times daily.  [name] losartan-hydrochlorothiazide (HYZAAR) 100-25 MG per tablet Take 1 tablet by mouth daily.  . metFORMIN (GLUCOPHAGE) 1000 MG tablet Take 1,000 mg by mouth daily with breakfast.   . naproxen (NAPROSYN) 500 MG tablet Take 1 tablet (500 mg total) by mouth 2 (two) times daily as needed for mild pain, moderate pain or headache (TAKE WITH MEALS.).  [name] oxyCODONE-acetaminophen (PERCOCET/ROXICET) 5-325 MG tablet Take 1 tablet by mouth every 8 (eight) hours as needed for severe pain.   No current facility-administered medications on file prior to visit.     Chief Complaint  Patient presents with  . Follow-up    Pt is in need of new BiPAP machine, [employer] no longer takes her insurance [employer]; she needs new DME company. Pt still not sleeping well on occassion, SOB with exertion more oftern than usual in last few months. Pt thinks sarcoidosis is flaring up again pt having pain in chest and upper back, tightness in chest. Pt has difficulty breathing while talking at times.     Pulmonary tests EBUS May 2015 >> granulomas PFT February 2015 >> DLCO 60%  Sleep tests BiPAP 10/07/16 to 11/05/16 >> used 29 of 30 nights with average 9 hrs 51 min.  Average AHI 0.4 with Bipap 16/12 cm H2O  Past medical history HTN, DM, Depression, anxiety   Past surgical history, Social history, Family history, Allergies reviewed.  Vital signs BP (!) 156/98 (BP Location: Left Arm, Cuff Size: Normal)    Pulse (!) 102   Ht 4\' 11"  (1.499 m)   Wt 199 lb 6.4 oz (90.4 kg)   SpO2 97%   BMI 40.27 kg/m   History of Present Illness: Cassandra Burns is a 51 y.o. female smoker with OSA, asthma and sarcoidosis.  She had CT abd/pelvis 03/30/17 which showed splenomegaly and adenopathy.  Also showed patchy nodule at right lung base.  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  She has intermittent cough with clear sputum.  She gets sweats.  She feels full in her abdomen.  Not having fever, skin rash, eye symptoms, joint pain, swelling, dyspnea, or chest pain.  She was seen by ophthalmology earlier this year.  Physical Exam:  General - pleasant Eyes - pupils reactive ENT - no sinus tenderness, no oral exudate, no LAN, black marks on tongue (chronic) Cardiac - regular, no murmur Chest - no wheeze, rales Abd - soft, non tender, fullness in Lt > Rt upper quadrants Ext - no edema Skin - no rashes Neuro - normal strength Psych - normal mood   Assessment/Plan:  Systemic sarcoidosis. - will need repeat PFT and HRCT chest to assess current status of pulmonary involvement - will arrange for lab tests - start prednisone 40 mg daily  Obstructive sleep apnea - she reports benefit from Bipap and compliance with therapy - continue Bipap 16/12 cm H2O - will arrange for new DME and new Bipap machine >> explained  she might need repeat testing for insurance coverage of new equipment  Mild, intermittent asthma. - continue prn albuterol  Obesity. - discussed importance of weight loss  DM type II. - advised her to f/u with PCP to adjust regimen while she is on prednisone   Patient Instructions  Prednisone 40 mg daily  Lab tests today  Will arrange for high resolution CT chest and pulmonary function test  Will arrange for new home care company and new Bipap machine  Follow up in 4 weeks with Dr. Halford Chessman or Nurse Practitioner    Chesley Mires, MD [place]  Pulmonary/Critical Care/Sleep Pager:  [phone] 04/21/2017, 10:36 AM

## 2017-04-21 NOTE — Patient Instructions (Signed)
Prednisone 40 mg daily  Lab tests today  Will arrange for high resolution CT chest and pulmonary function test  Will arrange for new home care company and new Bipap machine  Follow up in 4 weeks with Dr. Halford Chessman or Nurse Practitioner

## 2017-04-28 ENCOUNTER — Telehealth: Payer: Self-pay | Admitting: Pulmonary Disease

## 2017-04-28 NOTE — Telephone Encounter (Signed)
I called [facility] to see if they had a copy of this patient sleep study. [place] faxed the sleep study to me and I have now faxed it to [facility] attn: Barbaraann Rondo fax # [phone]

## 2017-04-29 ENCOUNTER — Ambulatory Visit (INDEPENDENT_AMBULATORY_CARE_PROVIDER_SITE_OTHER)
Admission: RE | Admit: 2017-04-29 | Discharge: 2017-04-29 | Disposition: A | Discharge status: Home / Self Care | Payer: Medicare HMO | Source: Ambulatory Visit | Attending: Pulmonary Disease | Admitting: Pulmonary Disease

## 2017-04-29 DIAGNOSIS — D869 Sarcoidosis, unspecified: Secondary | ICD-10-CM | POA: Diagnosis not present

## 2017-05-06 ENCOUNTER — Telehealth: Payer: Self-pay | Admitting: Pulmonary Disease

## 2017-05-06 NOTE — Telephone Encounter (Signed)
Called and spoke with patient today regarding results per VS.  Informed the patient of her results today and recommendations per VS. The patient verbalized understanding and denied any questions or concerns at this time. Nothing further needed. 

## 2017-05-06 NOTE — Telephone Encounter (Signed)
CBC    Component Value Date/Time   WBC 5.1 04/21/2017 1054   RBC 4.36 04/21/2017 1054   HGB 9.9 (L) 04/21/2017 1054   HGB 9.7 (L) 02/05/2017 1104   HCT 32.6 (L) 04/21/2017 1054   HCT 31.7 (L) 02/05/2017 1104   PLT 258.0 04/21/2017 1054   PLT 236 02/05/2017 1104   MCV 74.9 (L) 04/21/2017 1054   MCV 69.2 (L) 02/05/2017 1104   MCH 22.6 (L) 03/30/2017 1249   MCHC 30.4 04/21/2017 1054   RDW 19.6 (H) 04/21/2017 1054   RDW 20.0 (H) 02/05/2017 1104   LYMPHSABS 1.0 04/21/2017 1054   LYMPHSABS 0.9 02/05/2017 1104   MONOABS 0.6 04/21/2017 1054   MONOABS 0.5 02/05/2017 1104   EOSABS 0.1 04/21/2017 1054   EOSABS 0.1 02/05/2017 1104   BASOSABS 0.0 04/21/2017 1054   BASOSABS 0.0 02/05/2017 1104    CMP Latest Ref Rng & Units 04/21/2017 03/30/2017 07/13/2014  Glucose 70 - 99 mg/dL 175(H) 183(H) 285(H)  BUN 6 - 23 mg/dL 13 13 19   Creatinine 0.40 - 1.20 mg/dL 0.82 0.74 0.85  Sodium 135 - 145 mEq/L 138 135 137  Potassium 3.5 - 5.1 mEq/L 4.1 3.9 4.4  Chloride 96 - 112 mEq/L 103 100(L) 98  CO2 19 - 32 mEq/L 26 25 23   Calcium 8.4 - 10.5 mg/dL 10.5 10.7(H) 9.6  Total Protein 6.0 - 8.3 g/dL 9.2(H) 9.6(H) -  Total Bilirubin 0.2 - 1.2 mg/dL 0.5 0.3 -  Alkaline Phos 39 - 117 U/L 161(H) 132(H) -  AST 0 - 37 U/L 12 21 -  ALT 0 - 35 U/L 8 22 -    Lab Results  Component Value Date   ESRSEDRATE 130 (H) 04/21/2017    HRCT chest 04/29/17 >> atherosclerosis, patchy nodular thickening of interstitium, mild traction BTX, patchy air trapping, splenomegaly   Please inform her that her CT chest shows changes consistent with progression of sarcoidosis.  She should continue prednisone.  Will discuss in more detail at next visit.

## 2017-05-19 ENCOUNTER — Ambulatory Visit (INDEPENDENT_AMBULATORY_CARE_PROVIDER_SITE_OTHER): Payer: Medicare HMO | Admitting: Pulmonary Disease

## 2017-05-19 ENCOUNTER — Telehealth: Payer: Self-pay | Admitting: Acute Care

## 2017-05-19 ENCOUNTER — Ambulatory Visit (INDEPENDENT_AMBULATORY_CARE_PROVIDER_SITE_OTHER): Payer: Medicare HMO | Admitting: Acute Care

## 2017-05-19 ENCOUNTER — Encounter: Payer: Self-pay | Admitting: Acute Care

## 2017-05-19 DIAGNOSIS — D869 Sarcoidosis, unspecified: Secondary | ICD-10-CM | POA: Diagnosis not present

## 2017-05-19 DIAGNOSIS — J45909 Unspecified asthma, uncomplicated: Secondary | ICD-10-CM

## 2017-05-19 DIAGNOSIS — G4733 Obstructive sleep apnea (adult) (pediatric): Secondary | ICD-10-CM

## 2017-05-19 LAB — PULMONARY FUNCTION TEST
DL/VA % pred: 113 %
DL/VA: 4.81 ml/min/mmHg/L
DLCO COR: 10.99 ml/min/mmHg
DLCO cor % pred: 58 %
DLCO unc % pred: 58 %
DLCO unc: 11.09 ml/min/mmHg
FEF 25-75 Post: 1.97 L/sec
FEF 25-75 Pre: 1.86 L/sec
FEF2575-%Change-Post: 5 %
FEF2575-%PRED-PRE: 88 %
FEF2575-%Pred-Post: 93 %
FEV1-%Change-Post: -1 %
FEV1-%PRED-PRE: 61 %
FEV1-%Pred-Post: 60 %
FEV1-POST: 1.17 L
FEV1-Pre: 1.19 L
FEV1FVC-%Change-Post: 3 %
FEV1FVC-%Pred-Pre: 110 %
FEV6-%CHANGE-POST: -4 %
FEV6-%Pred-Post: 53 %
FEV6-%Pred-Pre: 56 %
FEV6-POST: 1.26 L
FEV6-Pre: 1.32 L
FEV6FVC-%Change-Post: 0 %
FEV6FVC-%PRED-POST: 103 %
FEV6FVC-%Pred-Pre: 103 %
FVC-%CHANGE-POST: -5 %
FVC-%PRED-POST: 52 %
FVC-%PRED-PRE: 55 %
FVC-POST: 1.26 L
FVC-PRE: 1.33 L
POST FEV6/FVC RATIO: 100 %
PRE FEV1/FVC RATIO: 90 %
Post FEV1/FVC ratio: 93 %
Pre FEV6/FVC Ratio: 100 %
RV % PRED: 72 %
RV: 1.17 L
TLC % PRED: 56 %
TLC: 2.52 L

## 2017-05-19 MED ORDER — PREDNISONE 10 MG PO TABS: ORAL_TABLET | ORAL | 0 refills | Status: DC

## 2017-05-19 MED ORDER — ALBUTEROL SULFATE (2.5 MG/3ML) 0.083% IN NEBU: 2.5000 mg | INHALATION_SOLUTION | Freq: Four times a day (QID) | RESPIRATORY_TRACT | 12 refills | Status: AC | PRN

## 2017-05-19 MED ORDER — ALBUTEROL SULFATE HFA 108 (90 BASE) MCG/ACT IN AERS: 1.0000 | INHALATION_SPRAY | Freq: Four times a day (QID) | RESPIRATORY_TRACT | 3 refills | Status: DC | PRN

## 2017-05-19 MED ORDER — NYSTATIN 100000 UNIT/ML MT SUSP: OROMUCOSAL | 0 refills | Status: DC

## 2017-05-19 NOTE — Progress Notes (Signed)
History of Present Illness Cassandra Burns is a 51 y.o. female current smoker with OSA, asthma and sarcoid.   05/19/2017 Follow up after initiation of prednisone for progressive sarcoid.She was seen by Dr. Halford Chessman 05/06/2017. Plan after that visit was : Systemic sarcoidosis. - will need repeat PFT and HRCT chest to assess current status of pulmonary involvement - will arrange for lab tests - start prednisone 40 mg daily  Obstructive sleep apnea - she reports benefit from Bipap and compliance with therapy - continue Bipap 16/12 cm H2O - will arrange for new DME and new Bipap machine >> explained she might need repeat testing for insurance coverage of new equipment  Mild, intermittent asthma. - continue prn albuterol  Obesity. - discussed importance of weight loss  DM type II. - advised her to f/u with PCP to adjust regimen while she is on prednisone  Follow up: She presents today for follow up. She states she has been compliant with her prednisone, but that she has had a significant increase in appetite, in addition to having increase in her blood sugars.She has gained 8 pounds in the last month.She states her cough is non-productive. She states she has not had any further night sweats in the interval since last being seen.She states the sensation of fullness in her abdomen may be " a little better".She denies fever, skin rash, eye symptoms, joint pain, swelling, dyspnea, or chest pain.She states she is compliant with her BiPAP. She is awaiting her new machine.She is compliant with her annual eye exams for both her DM and her sarcoid. She has annual follow up 10/2017.  Test Results: HRCT chest 04/29/17 >> atherosclerosis, patchy nodular thickening of interstitium, mild traction BTX, patchy air trapping, splenomegaly CT chest shows changes consistent with progression of sarcoidosis.     CT abd/pelvis 03/30/17 which showed splenomegaly and adenopathy.  Also showed patchy nodule  at right lung base.  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   Auto Immune Testing: 04/21/2017 Sed Rate 130  Pulmonary tests EBUS May 2015 >> granulomas PFT February 2015 >> DLCO 60% PFT 04/2017>> DLCO>> 58%, moderate restriction, Minimal BD response ( ? The impact of obesity on results)  Sleep tests BiPAP 10/07/16 to 11/05/16 >> used 29 of 30 nights with average 9 hrs 51 min.  Average AHI 0.4 with Bipap 16/12 cm H2O  CBC Latest Ref Rng & Units 04/21/2017 03/30/2017 02/05/2017  WBC 4.0 - 10.5 K/uL 5.1 5.3 3.9  Hemoglobin 12.0 - 15.0 g/dL 9.9(L) 10.1(L) 9.7(L)  Hematocrit 36.0 - 46.0 % 32.6(L) 32.7(L) 31.7(L)  Platelets 150.0 - 400.0 K/uL 258.0 245 236    BMP Latest Ref Rng & Units 04/21/2017 03/30/2017 07/13/2014  Glucose 70 - 99 mg/dL 175(H) 183(H) 285(H)  BUN 6 - 23 mg/dL 13 13 19   Creatinine 0.40 - 1.20 mg/dL 0.82 0.74 0.85  Sodium 135 - 145 mEq/L 138 135 137  Potassium 3.5 - 5.1 mEq/L 4.1 3.9 4.4  Chloride 96 - 112 mEq/L 103 100(L) 98  CO2 19 - 32 mEq/L 26 25 23   Calcium 8.4 - 10.5 mg/dL 10.5 10.7(H) 9.6    ProBNP    Component Value Date/Time   PROBNP 175.8 (H) 07/10/2014 1320    PFT    Component Value Date/Time   FEV1PRE 1.19 05/19/2017 1035   FEV1POST 1.17 05/19/2017 1035   FVCPRE 1.33 05/19/2017 1035   FVCPOST 1.26 05/19/2017 1035   TLC 2.52 05/19/2017 1035   DLCOUNC 11.09 05/19/2017  1035   PREFEV1FVCRT 90 05/19/2017 1035   PSTFEV1FVCRT 93 05/19/2017 1035    Ct Chest High Resolution  Result Date: 04/29/2017 CLINICAL DATA:  Sarcoidosis, presenting for follow-up on prednisone therapy. Dyspnea. EXAM: CT CHEST WITHOUT CONTRAST TECHNIQUE: Multidetector CT imaging of the chest was performed following the standard protocol without intravenous contrast. High resolution imaging of the lungs, as well as inspiratory and expiratory imaging, was performed. COMPARISON:  07/12/2014 chest radiograph. 07/10/2014 chest CT angiogram. FINDINGS:  Cardiovascular: Normal heart size. No significant pericardial fluid/thickening. Left main and left anterior descending coronary atherosclerosis. Atherosclerotic nonaneurysmal thoracic aorta. Normal caliber pulmonary arteries. Mediastinum/Nodes: No discrete thyroid nodules. Unremarkable esophagus. No axillary adenopathy. Stable mildly enlarged 1.1 cm right paratracheal node (series 2/ image 37). Newly mildly enlarged 1.0 cm left anterior mediastinal node (series 2/ image 35). Faintly calcified nonenlarged bilateral prevascular, AP window, subcarinal and bilateral hilar lymph nodes are stable. Lungs/Pleura: No pneumothorax. No pleural effusion. No acute consolidative airspace disease or lung masses. There is patchy finely nodular thickening of the peribronchovascular interstitium throughout both lungs, most prominent in the mid to upper lungs with asymmetric involvement of the right upper lobe. There is associated mild architectural distortion and mild traction bronchiectasis in both lungs, most prominent in the right upper lobe. No frank honeycombing. No bullous changes. Moderate patchy air trapping in both lungs on the expiration sequence. These findings have all progressed since 06/18/2013 chest CT, particularly the early fibrotic changes. Upper abdomen: Splenomegaly, partially visualized, probably increased since 06/18/2013. Musculoskeletal: No aggressive appearing focal osseous lesions. Moderate thoracic spondylosis. IMPRESSION: 1. Spectrum of findings compatible with pulmonary sarcoidosis, including patchy finely nodular peribronchovascular interstitial thickening with associated mild architectural distortion and mild traction bronchiectasis, most prominent in the right upper lobe. Interval progression of these findings since 06/18/2013 chest CT. 2. Stable to mildly increased mediastinal and bilateral hilar lymphadenopathy, faintly calcified, compatible with sarcoidosis. 3. Moderate patchy air trapping in both  lungs, indicative of small airways disease, which can also be due to pulmonary sarcoidosis. 4. Left main and 1 vessel coronary atherosclerosis. 5. Splenomegaly, partially visualized, probably increased and compatible with sarcoidosis. Aortic Atherosclerosis (ICD10-I70.0). Electronically Signed   By: Ilona Sorrel M.D.   On: 04/29/2017 12:32     Past medical hx Past Medical History:  Diagnosis Date  . Anxiety   . Asthma   . Bone pain 06/07/2013  . Depression   . 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 Hernia of abdominal cavity 10/25/2013  . Hypertension   . Iron deficiency anemia due to chronic blood loss   . Lymphadenopathy 06/07/2013  . Menometrorrhagia   . Obesity 12/18/2012  . Psoriasis   . Sarcoidosis 07/26/2013  . Sleep apnea   . Unspecified deficiency anemia 07/26/2013     Social History  Substance Use Topics  . Smoking status: Current Every Day Smoker    Packs/day: 0.25    Types: Cigarettes  . Smokeless tobacco: Never Used     Comment: Smokes Marijuana (once daily)  . Alcohol use 0.0 oz/week     Comment: all monthly    Ms.Cirillo reports that she has been smoking Cigarettes.  She has been smoking about 0.25 packs per day. She has never used smokeless tobacco. She reports that she drinks alcohol. She reports that she does not use drugs.  Tobacco Cessation: I have spent 4  minutes counseling patient on smoking cessation this visit.  Past surgical hx, Family hx, Social hx all reviewed.  Current Outpatient Prescriptions  on File Prior to Visit  Medication Sig  . acetaminophen (TYLENOL) 500 MG tablet Take 500 mg by mouth every 6 (six) hours as needed for mild pain.  [name] amLODipine (NORVASC) 10 MG tablet Take 1 tablet (10 mg total) by mouth daily.  [name] labetalol (NORMODYNE) 100 MG tablet Take 1 tablet (100 mg total) by mouth 2 (two) times daily.  [name] losartan-hydrochlorothiazide (HYZAAR) 100-25 MG per tablet Take 1 tablet by mouth daily.  . metFORMIN (GLUCOPHAGE) 1000 MG  tablet Take 1,000 mg by mouth daily with breakfast.   . naproxen (NAPROSYN) 500 MG tablet Take 1 tablet (500 mg total) by mouth 2 (two) times daily as needed for mild pain, moderate pain or headache (TAKE WITH MEALS.).  [name] oxyCODONE-acetaminophen (PERCOCET/ROXICET) 5-325 MG tablet Take 1 tablet by mouth every 8 (eight) hours as needed for severe pain.   No current facility-administered medications on file prior to visit.      No Known Allergies  Review Of Systems:  Constitutional:   No  weight loss, night sweats,  Fevers, chills, fatigue, or  lassitude.  HEENT:   No headaches,  Difficulty swallowing,  Tooth/dental problems, or  Sore throat,                No sneezing, itching, ear ache, nasal congestion, post nasal drip,   CV:  No chest pain,  Orthopnea, PND, + swelling in lower extremities, anasarca, dizziness, palpitations, syncope.   GI  No heartburn, indigestion, abdominal pain, nausea, vomiting, diarrhea, change in bowel habits, loss of appetite, bloody stools.   Resp: + shortness of breath with exertion less at rest.  No excess mucus, no productive cough,  + non-productive cough,  No coughing up of blood.  No change in color of mucus.  No wheezing.  No chest wall deformity  Skin: no rash or lesions.  GU: no dysuria, change in color of urine, no urgency or frequency.  No flank pain, no hematuria   MS:  No joint pain or swelling.  No decreased range of motion.  No back pain.  Psych:  No change in mood or affect. No depression or anxiety.  No memory loss.   Vital Signs BP 130/70 (BP Location: Right Arm, Cuff Size: Normal)   Pulse 87   Ht 5' (1.524 m)   Wt 208 lb (94.3 kg)   SpO2 94%   BMI 40.62 kg/m    Physical Exam:  Body mass index is 40.62 kg/m.  General- No distress,  A&Ox3, pleasant ENT: No sinus tenderness, TM clear, pale nasal mucosa, no oral exudate,no post nasal drip, no LAN Cardiac: S1, S2, regular rate and rhythm, no murmur Chest: No wheeze/ rales/  dullness; no accessory muscle use, no nasal flaring, no sternal retractions, diminished per bases Abd.: Soft Non-tender, obese Ext: No clubbing cyanosis, trace BLE edema Neuro:  normal strength, cranial nerves intact Skin: No rashes, warm and dry Psych: normal mood and behavior   Assessment/Plan  Sarcoidosis Flare and progression per CT 04/2017 Plan: We have renewed your Pulmonary Function Tests Start 30 mg prednisone daily. ( Dispense 10 mg tabs x 90 tabs) Monitor blood sugars   OSA (obstructive sleep apnea) Continue on BiPAP 16/12 cm H2O at bedtime. You appear to be benefiting from the treatment Hopefully you will have your new DME and new Bipap machine at next visit  Goal is to wear for at least 6 hours each night for maximal clinical benefit. Continue to work on weight loss, as the  link between excess weight  and sleep apnea is well established.  Do not drive if sleepy. Remember to clean mask, tubing, filter, and reservoir once weekly with soapy water.  Follow up in 1 month with Dr. Halford Chessman or NP. Please contact office for sooner follow up if symptoms do not improve or worsen or seek emergency care     Asthma Stable interval Plan: Continue proventil Rinse mouth after use  DM (diabetes mellitus) Carefully monitor sugars while on prednisone    Magdalen Spatz, NP 05/19/2017  1:38 PM

## 2017-05-19 NOTE — Assessment & Plan Note (Signed)
Stable interval Plan: Continue proventil Rinse mouth after use

## 2017-05-19 NOTE — Progress Notes (Signed)
PFT completed today 05/19/17.

## 2017-05-19 NOTE — Assessment & Plan Note (Signed)
Carefully monitor sugars while on prednisone

## 2017-05-19 NOTE — Assessment & Plan Note (Signed)
Flare and progression per CT 04/2017 Plan: We have renewed your Pulmonary Function Tests Start 30 mg prednisone daily. ( Dispense 10 mg tabs x 90 tabs) Monitor blood sugars

## 2017-05-19 NOTE — Assessment & Plan Note (Signed)
Continue on BiPAP 16/12 cm H2O at bedtime. You appear to be benefiting from the treatment Hopefully you will have your new DME and new Bipap machine at next visit  Goal is to wear for at least 6 hours each night for maximal clinical benefit. Continue to work on weight loss, as the link between excess weight  and sleep apnea is well established.  Do not drive if sleepy. Remember to clean mask, tubing, filter, and reservoir once weekly with soapy water.  Follow up in 1 month with Dr. Halford Chessman or NP. Please contact office for sooner follow up if symptoms do not improve or worsen or seek emergency care

## 2017-05-19 NOTE — Telephone Encounter (Signed)
Please call patient and let her know that we would like her to take the 30 mg of prednisone once daily 2 weeks. Let her know after 2 weeks I want her to drop her dose to 20 mg daily 2 weeks. She has follow-up appointment in 4 weeks at which time we will determine further tapering of her prednisone. Thank you

## 2017-05-19 NOTE — Progress Notes (Signed)
I have reviewed and agree with assessment/plan.  Chesley Mires, MD [facility] Pulmonary/Critical Care 05/19/2017, 2:53 PM Pager:  [phone]

## 2017-05-19 NOTE — Patient Instructions (Addendum)
It is nice to meet you today. We have renewed your Pulmonary Function Tests Start 30 mg prednisone daily. ( Dispense 10 mg tabs x 90 tabs) Monitor blood sugars Continue Proventil daily as you have been doing for your asthma  Rinse mouth after use Nystatin mouthwash 5 cc's 3 times daily x 2 weeks. We will refill prescription for albuterol nebs. Continue on BiPAP 16/12 cm H2O at bedtime. You appear to be benefiting from the treatment Hopefully you will have your new DME and new Bipap machine at next visit  Goal is to wear for at least 6 hours each night for maximal clinical benefit. Continue to work on weight loss, as the link between excess weight  and sleep apnea is well established.  Do not drive if sleepy. Remember to clean mask, tubing, filter, and reservoir once weekly with soapy water.  Follow up in 1 month with Dr. Halford Chessman or NP. Please contact office for sooner follow up if symptoms do not improve or worsen or seek emergency care

## 2017-05-19 NOTE — Telephone Encounter (Signed)
-----   Message from Chesley Mires, MD sent at 05/19/2017  1:23 PM EDT ----- This plan sounds okay.  V[place]et  ----- Message ----- From: Magdalen Spatz, NP Sent: 05/19/2017  12:53 PM To: Chesley Mires, MD  Dr. Halford Chessman, I saw Cassandra Burns today. PFT's show moderate restriction and with DLCO of 58% and No BD response. CT shows progression of sarcoid. She is currently on 40 mg prednisone daily. She states her appetite increased. She states she has gained 8 pounds in the last month. I am going to decrease her to 30 mg daily x 2 weeks . If she dose well on that I would like to decrease to 20 mg. Please advise on plan for prednisone therapy. ( Blood sugars are also an issue) Thanks, Judson Roch

## 2017-05-20 NOTE — Telephone Encounter (Signed)
Called pt and advised message from the provider. Pt understood and verbalized understanding. Nothing further is needed.    

## 2017-06-23 ENCOUNTER — Ambulatory Visit: Payer: Medicare HMO | Admitting: Acute Care

## 2017-06-30 ENCOUNTER — Encounter: Payer: Self-pay | Admitting: Acute Care

## 2017-06-30 ENCOUNTER — Ambulatory Visit (INDEPENDENT_AMBULATORY_CARE_PROVIDER_SITE_OTHER): Payer: Medicare HMO | Admitting: Acute Care

## 2017-06-30 DIAGNOSIS — I10 Essential (primary) hypertension: Secondary | ICD-10-CM

## 2017-06-30 DIAGNOSIS — D869 Sarcoidosis, unspecified: Secondary | ICD-10-CM | POA: Diagnosis not present

## 2017-06-30 DIAGNOSIS — E118 Type 2 diabetes mellitus with unspecified complications: Secondary | ICD-10-CM

## 2017-06-30 DIAGNOSIS — G4733 Obstructive sleep apnea (adult) (pediatric): Secondary | ICD-10-CM

## 2017-06-30 DIAGNOSIS — J45909 Unspecified asthma, uncomplicated: Secondary | ICD-10-CM | POA: Diagnosis not present

## 2017-06-30 DIAGNOSIS — F17210 Nicotine dependence, cigarettes, uncomplicated: Secondary | ICD-10-CM | POA: Diagnosis not present

## 2017-06-30 DIAGNOSIS — Z720 Tobacco use: Secondary | ICD-10-CM | POA: Insufficient documentation

## 2017-06-30 NOTE — Assessment & Plan Note (Addendum)
Per patient she has noted improvement in breathing. Plan: We will decrease your prednisone to 10 mg daily. Please let us know if your breathing gets worse as we lower the prednisone dose. Will decrease prednisone again in 6 weeks if she is continuing to do well.

## 2017-06-30 NOTE — Progress Notes (Signed)
History of Present Illness Cassandra Burns is a 51 y.o. female current smoker with with OSA, asthma and sarcoid.She is followed by Dr. Halford Chessman.   06/30/2017 4 week follow up appointment Pt. Presents for follow up. She states she is doing well on the 20 mg daily of prednisone.She states she has had a lot of weight gain. She has had an 18 pound weight gain since starting the prednisone.. She is very upset about her weight gain. She states she breathing is better. She states she has less dyspnea on exertion.She is compliant with her CPAP. She was in [place] for 5 days, and did not take her new CPAP, but did use her old CPAP while she was away. HGB A1C has increased on prednisone. She has added additional Lantus coverage.She denies fever, chest pain, orthopnea or hemoptysis.Pt was honest with me and states she is not taking her BP medication as she should be. Additionally she continues to smoke.  Test Results: HRCT chest 04/29/17 >> atherosclerosis, patchy nodular thickening of interstitium, mild traction BTX, patchy air trapping, splenomegaly CT chest shows changes consistent with progression of sarcoidosis.    CT abd/pelvis 03/30/17 which showed splenomegaly and adenopathy. Also showed patchy nodule at right lung base. 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  Auto Immune Testing: 04/21/2017 Sed Rate 130  Pulmonary tests EBUS May 2015 >> granulomas PFT February 2015 >> DLCO 60% PFT 04/2017>> DLCO>> 58%, moderate restriction, Minimal BD response ( ? The impact of obesity on results)  Sleep tests BiPAP 10/07/16 to 11/05/16 >>used 29 of 30 nights with average 9 hrs 51 min. Average AHI 0.4 with Bipap 16/12 cm H2O BIPAP>> 05/31/2017-06/29/2017>> used 25 of 30 days with average usage of 9 hours each night, AHI is 0.5  CBC Latest Ref Rng & Units 04/21/2017 03/30/2017 02/05/2017  WBC 4.0 - 10.5 K/uL 5.1 5.3 3.9  Hemoglobin 12.0 - 15.0 g/dL 9.9(L) 10.1(L)  9.7(L)  Hematocrit 36.0 - 46.0 % 32.6(L) 32.7(L) 31.7(L)  Platelets 150.0 - 400.0 K/uL 258.0 245 236    BMP Latest Ref Rng & Units 04/21/2017 03/30/2017 07/13/2014  Glucose 70 - 99 mg/dL 175(H) 183(H) 285(H)  BUN 6 - 23 mg/dL 13 13 19   Creatinine 0.40 - 1.20 mg/dL 0.82 0.74 0.85  Sodium 135 - 145 mEq/L 138 135 137  Potassium 3.5 - 5.1 mEq/L 4.1 3.9 4.4  Chloride 96 - 112 mEq/L 103 100(L) 98  CO2 19 - 32 mEq/L 26 25 23   Calcium 8.4 - 10.5 mg/dL 10.5 10.7(H) 9.6     ProBNP    Component Value Date/Time   PROBNP 175.8 (H) 07/10/2014 1320    PFT    Component Value Date/Time   FEV1PRE 1.19 05/19/2017 1035   FEV1POST 1.17 05/19/2017 1035   FVCPRE 1.33 05/19/2017 1035   FVCPOST 1.26 05/19/2017 1035   TLC 2.52 05/19/2017 1035   DLCOUNC 11.09 05/19/2017 1035   PREFEV1FVCRT 90 05/19/2017 1035   PSTFEV1FVCRT 93 05/19/2017 1035     Past medical hx Past Medical History:  Diagnosis Date  . Anxiety   . Asthma   . Bone pain 06/07/2013  . Depression   . 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 Hernia of abdominal cavity 10/25/2013  . Hypertension   . Iron deficiency anemia due to chronic blood loss   . Lymphadenopathy 06/07/2013  . Menometrorrhagia   . Obesity 12/18/2012  . Psoriasis   . Sarcoidosis 07/26/2013  . Sleep apnea   .  Unspecified deficiency anemia 07/26/2013     Social History   Tobacco Use  . Smoking status: Current Every Day Smoker    Packs/day: 0.25    Types: Cigarettes  . Smokeless tobacco: Never Used  . Tobacco comment: Smokes Marijuana (once daily)  Substance Use Topics  . Alcohol use: Yes    Alcohol/week: 0.0 oz    Comment: all monthly  . Drug use: No    Comment: Marijuana--once a week    Ms.Julian reports that she has been smoking cigarettes.  She has been smoking about 0.25 packs per day. she has never used smokeless tobacco. She reports that she drinks alcohol. She reports that she does not use drugs.  Tobacco Cessation: I have spent 3  minutes counseling patient on smoking cessation this visit. She was very honest  that she is continuing to smoke.   Past surgical hx, Family hx, Social hx all reviewed.  Current Outpatient Medications on File Prior to Visit  Medication Sig  . acetaminophen (TYLENOL) 500 MG tablet Take 500 mg by mouth every 6 (six) hours as needed for mild pain.  [name] albuterol (PROVENTIL HFA;VENTOLIN HFA) 108 (90 Base) MCG/ACT inhaler Inhale 1-2 puffs into the lungs every 6 (six) hours as needed for wheezing or shortness of breath.  [name] albuterol (PROVENTIL) (2.5 MG/3ML) 0.083% nebulizer solution Take 3 mLs (2.5 mg total) by nebulization every 6 (six) hours as needed for wheezing or shortness of breath.  [name] amLODipine (NORVASC) 10 MG tablet Take 1 tablet (10 mg total) by mouth daily.  [name] labetalol (NORMODYNE) 100 MG tablet Take 1 tablet (100 mg total) by mouth 2 (two) times daily.  [name] losartan-hydrochlorothiazide (HYZAAR) 100-25 MG per tablet Take 1 tablet by mouth daily.  . metFORMIN (GLUCOPHAGE) 1000 MG tablet Take 1,000 mg by mouth daily with breakfast.   . naproxen (NAPROSYN) 500 MG tablet Take 1 tablet (500 mg total) by mouth 2 (two) times daily as needed for mild pain, moderate pain or headache (TAKE WITH MEALS.).  [name] nystatin (MYCOSTATIN) 100000 UNIT/ML suspension Use 57ml three times a day for 2 weeks.  [name] oxyCODONE-acetaminophen (PERCOCET/ROXICET) 5-325 MG tablet Take 1 tablet by mouth every 8 (eight) hours as needed for severe pain.  . predniSONE (DELTASONE) 10 MG tablet Take 3 tablets every day by mouth.   No current facility-administered medications on file prior to visit.      No Known Allergies  Review Of Systems:  Constitutional:   No  weight loss, + night sweats,  No Fevers, chills, fatigue, or  Lassitude, + weight gain.  HEENT:   + headaches,  Difficulty swallowing,  Tooth/dental problems, or  Sore throat,                No sneezing, itching, ear ache, nasal congestion, post nasal drip,   CV:  No  chest pain,  Orthopnea, PND, swelling in lower extremities, anasarca, dizziness, palpitations, syncope.   GI  No heartburn, indigestion, abdominal pain, nausea, vomiting, diarrhea, change in bowel habits, loss of appetite, bloody stools.   Resp: + shortness of breath with exertion or at rest.  No excess mucus, no productive cough,  No non-productive cough,  No coughing up of blood.  No change in color of mucus.  No wheezing.  No chest wall deformity  Skin: no rash or lesions.  GU: no dysuria, change in color of urine, no urgency or frequency.  No flank pain, no hematuria   MS:  No joint pain or swelling.  No decreased range of motion.  No back pain.  Psych:  No change in mood or affect. No depression or anxiety.  No memory loss.   Vital Signs BP (!) 160/110 (BP Location: Left Arm, Cuff Size: Normal)   Pulse 98   Ht 5' (1.524 m)   Wt 215 lb 12.8 oz (97.9 kg)   SpO2 94%   BMI 42.15 kg/m    Physical Exam:  General- No distress,  A&Ox3, pleasant ENT: No sinus tenderness, TM clear, pale nasal mucosa, no oral exudate,no post nasal drip, no LAN, moonface Cardiac: S1, S2, regular rate and rhythm, no murmur Chest: No wheeze/ rales/ dullness; no accessory muscle use, no nasal flaring, no sternal retractions, diminished per bases Abd.: Soft Non-tender, non-distended, obese. Ext: No clubbing cyanosis, edema Neuro:  normal strength, deconditioned, head ache  ( Elevated BP, has not taken BP meds), MAE x 4, A&O x 3, appropriate Skin: No rashes, warm and dry Psych: normal mood and behavior, appropriately concerned about her weight gain.   Assessment/Plan  Essential hypertension Not taking medication as prescribed Plan: Blood Pressure Please take your blood pressure mediations as prescribed daily. Check BP at home or at [facility] If BP remains high please call your PCP to have them adjust your medication If you don't take your blood pressure medications you are at risk of  stroke.    OSA (obstructive sleep apnea) Compliant per down Load Continue on CPAP at bedtime. You appear to be benefiting from the treatment Goal is to wear for at least 6 hours each night for maximal clinical benefit. Continue to work on weight loss, as the link between excess weight  and sleep apnea is well established.  Do not drive if sleepy. Remember to clean mask, tubing, filter, and reservoir once weekly with soapy water.     Asthma Stable interval Continue Proventil Rinse mouth after use  DM (diabetes mellitus) Monitor blood sugars while on prednisone  Sarcoidosis Per patient she has noted improvement in breathing. Plan: We will decrease your prednisone to 10 mg daily. Please let us know if your breathing gets worse as we lower the prednisone dose. Will decrease prednisone again in 6 weeks if she is continuing to do well.  Tobacco abuse Please quit smoking I have spent  3 minutes counseling patient on smoking cessation this visit.    Magdalen Spatz, NP 06/30/2017  3:17 PM

## 2017-06-30 NOTE — Assessment & Plan Note (Addendum)
Compliant per down Load Continue on CPAP at bedtime. You appear to be benefiting from the treatment Goal is to wear for at least 6 hours each night for maximal clinical benefit. Continue to work on weight loss, as the link between excess weight  and sleep apnea is well established.  Do not drive if sleepy. Remember to clean mask, tubing, filter, and reservoir once weekly with soapy water.

## 2017-06-30 NOTE — Assessment & Plan Note (Signed)
Not taking medication as prescribed Plan: Blood Pressure Please take your blood pressure mediations as prescribed daily. Check BP at home or at [facility] If BP remains high please call your PCP to have them adjust your medication If you don't take your blood pressure medications you are at risk of stroke.

## 2017-06-30 NOTE — Assessment & Plan Note (Signed)
Stable interval Continue Proventil Rinse mouth after use

## 2017-06-30 NOTE — Assessment & Plan Note (Signed)
Monitor blood sugars while on prednisone

## 2017-06-30 NOTE — Assessment & Plan Note (Signed)
Please quit smoking I have spent  3 minutes counseling patient on smoking cessation this visit.

## 2017-06-30 NOTE — Progress Notes (Signed)
I have reviewed and agree with assessment/plan.  Chesley Mires, MD [facility] Pulmonary/Critical Care 06/30/2017, 4:21 PM Pager:  [phone]

## 2017-06-30 NOTE — Patient Instructions (Addendum)
It is good to see you today. We will decrease your prednisone to 10 mg daily. Please let us know if your breathing gets worse as we lower the prednisone dose. Continue on CPAP at bedtime. You appear to be benefiting from the treatment Goal is to wear for at least 6 hours each night for maximal clinical benefit. Continue to work on weight loss, as the link between excess weight  and sleep apnea is well established.  Do not drive if sleepy. Remember to clean mask, tubing, filter, and reservoir once weekly with soapy water.  Return to reduce prednisone in 1 month. (  Week of January 16th  2019)   Asthma Stable interval Plan: Continue proventil Rinse mouth after use  DM (diabetes mellitus) Carefully monitor sugars while on prednisone  Blood Pressure Please take your blood pressure mediations as prescribed daily. Check BP at home or at [facility] If BP remains high please call your PCP to have them adjust it.  Tobacco Abuse Please quit smoking

## 2017-07-25 ENCOUNTER — Other Ambulatory Visit: Payer: Self-pay | Admitting: Acute Care

## 2017-08-08 ENCOUNTER — Telehealth: Payer: Self-pay | Admitting: *Deleted

## 2017-08-08 ENCOUNTER — Inpatient Hospital Stay: Payer: Medicare HMO | Attending: Hematology and Oncology

## 2017-08-08 DIAGNOSIS — D500 Iron deficiency anemia secondary to blood loss (chronic): Secondary | ICD-10-CM | POA: Diagnosis not present

## 2017-08-08 LAB — RETICULOCYTES
RBC.: 4.86 MIL/uL (ref 3.70–5.45)
RETIC COUNT ABSOLUTE: 155.5 10*3/uL — AB (ref 33.7–90.7)
RETIC CT PCT: 3.2 % — AB (ref 0.7–2.1)

## 2017-08-08 LAB — CBC WITH DIFFERENTIAL (CANCER CENTER ONLY)
Basophils Absolute: 0 10*3/uL (ref 0.0–0.1)
Basophils Relative: 0 %
Eosinophils Absolute: 0.1 10*3/uL (ref 0.0–0.5)
Eosinophils Relative: 1 %
HCT: 38.3 % (ref 34.8–46.6)
Hemoglobin: 11.7 g/dL (ref 11.6–15.9)
Lymphocytes Relative: 31 %
Lymphs Abs: 2.1 10*3/uL (ref 0.9–3.3)
MCH: 24.1 pg — ABNORMAL LOW (ref 25.1–34.0)
MCHC: 30.5 g/dL — ABNORMAL LOW (ref 31.5–36.0)
MCV: 78.8 fL — ABNORMAL LOW (ref 79.5–101.0)
Monocytes Absolute: 0.5 10*3/uL (ref 0.1–0.9)
Monocytes Relative: 7 %
Neutro Abs: 4 10*3/uL (ref 1.5–6.5)
Neutrophils Relative %: 61 %
Platelet Count: 189 10*3/uL (ref 145–400)
RBC: 4.86 MIL/uL (ref 3.70–5.45)
RDW: 16.5 % — ABNORMAL HIGH (ref 11.2–16.1)
WBC Count: 6.7 10*3/uL (ref 3.9–10.3)

## 2017-08-08 LAB — IRON AND TIBC
IRON: 67 ug/dL (ref 41–142)
SATURATION RATIOS: 21 % (ref 21–57)
TIBC: 319 ug/dL (ref 236–444)
UIBC: 251 ug/dL

## 2017-08-08 LAB — FERRITIN: Ferritin: 324 ng/mL — ABNORMAL HIGH (ref 9–269)

## 2017-08-08 NOTE — Telephone Encounter (Signed)
Notified of message below. OK to come for labs in 3 months. Message to scheduler

## 2017-08-08 NOTE — Telephone Encounter (Signed)
-----   Message from Heath Lark, MD sent at 08/08/2017 11:57 AM EST ----- Regarding: labs are OK Her labs and iron studies are good If OK with her, please cancel all her appt next week Recommend recheck in 3 months. Please place scheduling msg for labs only in 3 months and we will call her with results ----- Message ----- From: Interface, Lab In [place] Sent: 08/08/2017  10:25 AM To: Heath Lark, MD

## 2017-08-11 ENCOUNTER — Telehealth: Payer: Self-pay | Admitting: Hematology and Oncology

## 2017-08-11 NOTE — Telephone Encounter (Signed)
Mailed patient calendar of upcoming April appointments per 1/11 sch message

## 2017-08-15 ENCOUNTER — Ambulatory Visit: Payer: Medicare HMO | Admitting: Hematology and Oncology

## 2017-08-15 ENCOUNTER — Ambulatory Visit: Payer: Medicare HMO

## 2017-08-22 ENCOUNTER — Ambulatory Visit: Payer: Medicare HMO

## 2017-09-15 ENCOUNTER — Ambulatory Visit: Payer: Medicare HMO | Admitting: Acute Care

## 2017-09-29 ENCOUNTER — Encounter: Payer: Self-pay | Admitting: Acute Care

## 2017-09-29 ENCOUNTER — Ambulatory Visit: Payer: Medicare HMO | Admitting: Acute Care

## 2017-09-29 DIAGNOSIS — D869 Sarcoidosis, unspecified: Secondary | ICD-10-CM

## 2017-09-29 DIAGNOSIS — Z720 Tobacco use: Secondary | ICD-10-CM

## 2017-09-29 MED ORDER — BUDESONIDE-FORMOTEROL FUMARATE 160-4.5 MCG/ACT IN AERO: 2.0000 | INHALATION_SPRAY | Freq: Two times a day (BID) | RESPIRATORY_TRACT | 6 refills | Status: DC

## 2017-09-29 MED ORDER — PREDNISONE 10 MG PO TABS: ORAL_TABLET | ORAL | 0 refills | Status: DC

## 2017-09-29 MED ORDER — ALBUTEROL SULFATE HFA 108 (90 BASE) MCG/ACT IN AERS: 1.0000 | INHALATION_SPRAY | Freq: Four times a day (QID) | RESPIRATORY_TRACT | 3 refills | Status: DC | PRN

## 2017-09-29 NOTE — Patient Instructions (Addendum)
Continue prednisone 10 mg daily. Use your Symbicort 2 puff twice daily every day without fail. Rinse mouth after use We will send in a prescription. We will send in a prescription for a rescue inhaler. Use rescue inhaler for breakthrough shortness of breath up to 2-3 time a day. We will send in a prescription for prednisone 10 mg daily. Take blood pressure medication when you get home. Follow up appointment in 4 weeks with Dr. Halford Chessman or Judson Roch NP. Congratulations on quitting smoking. I am proud of you. Please contact office for sooner follow up if symptoms do not improve or worsen or seek emergency care

## 2017-09-29 NOTE — Assessment & Plan Note (Signed)
States she has quit smoking since last OV: Plan: Continue to follow for compliance. I congratulated her on quitting and reminded her to remain smoke free.

## 2017-09-29 NOTE — Progress Notes (Signed)
History of Present Illness Cassandra Burns is a 52 y.o. female  Former  Smoker ( Quit 07/2017 ) with [place], asthma and sarcoid.She is followed by Dr. Halford Chessman.   09/29/2017 Follow up after decreasing prednisone from 20 mg daily to 10 mg daily. Pt. Presents for follow up. She states she has decreased her prednisone dose to 10 mg daily as instructed.She states she noticed a significant worsening of her dyspnea in the decrease to 10 mg daily. She state every day is a bad day in regard to her shortness of breath. She states she has quit smoking.She states she is committed to remaining smoke free.She has not been using her Symbicort every day without fail. We discussed the need to use her maintenance inhaler every day without fail. Additionally she does not have a rescue inhaler. I am unsure if her dyspnea is 2/2 her asthma or her sarcoid as she has not been managing her asthma on a daily basis.  She has started on a diet medication per her PCP. She has goals to continue weight loss and better control her blood sugars. She denies fever, chest pain, orthopnea or hemoptysis. She denies leg pain or recent lengthy travel by plane or automobile..    Test Results: HRCT chest 04/29/17 >> atherosclerosis, patchy nodular thickening of interstitium, mild traction BTX, patchy air trapping, splenomegaly CT chest shows changes consistent with progression of sarcoidosis.   CT abd/pelvis 03/30/17 which showed splenomegaly and adenopathy. Also showed patchy nodule at right lung base. 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  Auto Immune Testing: 04/21/2017 Sed Rate 130  Pulmonary tests EBUS May 2015 >> granulomas PFT February 2015 >> DLCO 60% PFT 04/2017>> DLCO>> 58%, moderate restriction, Minimal BD response ( ? The impact of obesity on results)  Sleep tests BiPAP 10/07/16 to 11/05/16 >>used 29 of 30 nights with average 9 hrs 51 min. Average AHI 0.4 with Bipap 16/12 cm  H2O BIPAP>> 05/31/2017-06/29/2017>> used 25 of 30 days with average usage of 9 hours each night, AHI is 0.5  CBC Latest Ref Rng & Units 08/08/2017 04/21/2017 03/30/2017  WBC 3.9 - 10.3 K/uL 6.7 5.1 5.3  Hemoglobin 12.0 - 15.0 g/dL - 9.9(L) 10.1(L)  Hematocrit 34.8 - 46.6 % 38.3 32.6(L) 32.7(L)  Platelets 145 - 400 K/uL 189 258.0 245    BMP Latest Ref Rng & Units 04/21/2017 03/30/2017 07/13/2014  Glucose 70 - 99 mg/dL 175(H) 183(H) 285(H)  BUN 6 - 23 mg/dL 13 13 19   Creatinine 0.40 - 1.20 mg/dL 0.82 0.74 0.85  Sodium 135 - 145 mEq/L 138 135 137  Potassium 3.5 - 5.1 mEq/L 4.1 3.9 4.4  Chloride 96 - 112 mEq/L 103 100(L) 98  CO2 19 - 32 mEq/L 26 25 23   Calcium 8.4 - 10.5 mg/dL 10.5 10.7(H) 9.6    BNP No results found for: BNP  ProBNP    Component Value Date/Time   PROBNP 175.8 (H) 07/10/2014 1320    PFT    Component Value Date/Time   FEV1PRE 1.19 05/19/2017 1035   FEV1POST 1.17 05/19/2017 1035   FVCPRE 1.33 05/19/2017 1035   FVCPOST 1.26 05/19/2017 1035   TLC 2.52 05/19/2017 1035   DLCOUNC 11.09 05/19/2017 1035   PREFEV1FVCRT 90 05/19/2017 1035   PSTFEV1FVCRT 93 05/19/2017 1035    No results found.   Past medical hx Past Medical History:  Diagnosis Date  . Anxiety   . Asthma   . Bone pain 06/07/2013  . Depression   .  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 Hernia of abdominal cavity 10/25/2013  . Hypertension   . Iron deficiency anemia due to chronic blood loss   . Lymphadenopathy 06/07/2013  . Menometrorrhagia   . Obesity 12/18/2012  . Psoriasis   . Sarcoidosis 07/26/2013  . Sleep apnea   . Unspecified deficiency anemia 07/26/2013     Social History   Tobacco Use  . Smoking status: Former Smoker    Packs/day: 0.25    Years: 2.00    Pack years: 0.50    Types: Cigars    Last attempt to quit: 06/30/2017    Years since quitting: 0.2  . Smokeless tobacco: Never Used  . Tobacco comment: Smokes Marijuana (once daily)  Substance Use Topics  . Alcohol use: Yes      Alcohol/week: 0.0 oz    Comment: all monthly  . Drug use: No    Comment: Marijuana--once a week    Ms.Vorhees reports that she quit smoking about 2 months ago. Her smoking use included cigars. She has a 0.50 pack-year smoking history. she has never used smokeless tobacco. She reports that she drinks alcohol. She reports that she does not use drugs.  Tobacco Cessation: Pt. Has quit smoking since last OV  Past surgical hx, Family hx, Social hx all reviewed.  Current Outpatient Medications on File Prior to Visit  Medication Sig  . acetaminophen (TYLENOL) 500 MG tablet Take 500 mg by mouth every 6 (six) hours as needed for mild pain.  [name] albuterol (PROVENTIL) (2.5 MG/3ML) 0.083% nebulizer solution Take 3 mLs (2.5 mg total) by nebulization every 6 (six) hours as needed for wheezing or shortness of breath.  [name] amLODipine (NORVASC) 10 MG tablet Take 1 tablet (10 mg total) by mouth daily.  [name] labetalol (NORMODYNE) 100 MG tablet Take 1 tablet (100 mg total) by mouth 2 (two) times daily.  [name] losartan-hydrochlorothiazide (HYZAAR) 100-25 MG per tablet Take 1 tablet by mouth daily.  . metFORMIN (GLUCOPHAGE) 1000 MG tablet Take 1,000 mg by mouth daily with breakfast.   . naproxen (NAPROSYN) 500 MG tablet Take 1 tablet (500 mg total) by mouth 2 (two) times daily as needed for mild pain, moderate pain or headache (TAKE WITH MEALS.).  [name] nystatin (MYCOSTATIN) 100000 UNIT/ML suspension Use 40ml three times a day for 2 weeks.  [name] oxyCODONE-acetaminophen (PERCOCET/ROXICET) 5-325 MG tablet Take 1 tablet by mouth every 8 (eight) hours as needed for severe pain.   No current facility-administered medications on file prior to visit.      No Known Allergies  Review Of Systems:  Constitutional:   No  weight loss, night sweats,  Fevers, chills, fatigue, or  lassitude.  HEENT:   No headaches,  Difficulty swallowing,  Tooth/dental problems, or  Sore throat,                No sneezing, itching, ear ache, nasal  congestion, post nasal drip,   CV:  No chest pain,  Orthopnea, PND, swelling in lower extremities, anasarca, dizziness, palpitations, syncope.   GI  No heartburn, indigestion, abdominal pain, nausea, vomiting, diarrhea, change in bowel habits, loss of appetite, bloody stools.   Resp: + shortness of breath with exertion less at rest.  No excess mucus, no productive cough,  No non-productive cough,  No coughing up of blood.  No change in color of mucus.  No wheezing.  No chest wall deformity  Skin: no rash or lesions.  GU: no dysuria, change in color of urine,  no urgency or frequency.  No flank pain, no hematuria   MS:  No joint pain or swelling.  No decreased range of motion.  No back pain.  Psych:  No change in mood or affect. No depression or anxiety.  No memory loss.   Vital Signs BP (!) 160/100 (BP Location: Left Arm, Cuff Size: Normal)   Pulse (!) 101   Ht 4\' 11"  (1.499 m)   Wt 229 lb 3.2 oz (104 kg)   SpO2 96%   BMI 46.29 kg/m    Physical Exam:  General- No distress,  A&Ox3, pleasant ENT: No sinus tenderness, TM clear, pale nasal mucosa, no oral exudate,no post nasal drip, no LAN, thick neck Cardiac: S1, S2, regular rate and rhythm, no murmur Chest: No wheeze/ rales/ dullness; no accessory muscle use, no nasal flaring, no sternal retractions, diminished per bases. Abd.: Soft Non-tender, ND, BS+, Obese Ext: No clubbing cyanosis, edema Neuro:  normal strength, MAE x 4, A&O x 3 Skin: No rashes, warm and dry Psych: normal mood and behavior   Assessment/Plan  Sarcoidosis States she has had worsening in dyspnea with decrease from 20 mg prednisone daily to 10 mg daily She is not using her maintenance inhaler daily for her asthma ? Dyspnea 2/2 sarcoid vs poorly controlled asthma. Plan Continue prednisone 10 mg daily. Use your Symbicort 2 puff twice daily every day without fail. Rinse mouth after use We will send in a prescription. We will send in a prescription for a  rescue inhaler. Use rescue inhaler for breakthrough shortness of breath up to 2-3 time a day. We will send in a prescription for prednisone 10 mg daily. If no improvement with better maintenance compliance over the next 4 weeks,  we will increase prednisone to 15 mg daily with goal of getting to lowest effective dose. Take blood pressure medication when you get home. Follow up appointment in 4 weeks with Dr. Halford Chessman or Judson Roch NP. Congratulations on quitting smoking. I am proud of you. Please contact office for sooner follow up if symptoms do not improve or worsen or seek emergency care     Tobacco abuse States she has quit smoking since last OV: Plan: Continue to follow for compliance. I congratulated her on quitting and reminded her to remain smoke free.    Magdalen Spatz, NP 09/29/2017  4:12 PM

## 2017-09-29 NOTE — Assessment & Plan Note (Signed)
States she has had worsening in dyspnea with decrease from 20 mg prednisone daily to 10 mg daily She is not using her maintenance inhaler daily for her asthma ? Dyspnea 2/2 sarcoid vs poorly controlled asthma. Plan Continue prednisone 10 mg daily. Use your Symbicort 2 puff twice daily every day without fail. Rinse mouth after use We will send in a prescription. We will send in a prescription for a rescue inhaler. Use rescue inhaler for breakthrough shortness of breath up to 2-3 time a day. We will send in a prescription for prednisone 10 mg daily. If no improvement with better maintenance compliance over the next 4 weeks,  we will increase prednisone to 15 mg daily with goal of getting to lowest effective dose. Take blood pressure medication when you get home. Follow up appointment in 4 weeks with Dr. Halford Chessman or Judson Roch NP. Congratulations on quitting smoking. I am proud of you. Please contact office for sooner follow up if symptoms do not improve or worsen or seek emergency care

## 2017-09-30 NOTE — Progress Notes (Signed)
Reviewed and agree with assessment/plan.   Carlin Attridge, MD [place] Pulmonary/Critical Care 07/24/2016, 12:24 PM Pager:  [phone]  

## 2017-10-27 ENCOUNTER — Ambulatory Visit: Payer: Medicare HMO | Admitting: Acute Care

## 2017-10-27 NOTE — Progress Notes (Deleted)
History of Present Illness Cassandra Burns is a 52 y.o. female current smoker with withOSA, asthma , sarcoid, DM and HTN .She is followed by Dr. Halford Chessman.    10/27/2017 Pt. Presents for follow up. She was last seen 06/2017. At that appointment we were in the process of weaning her prednisone to 10 mg daily after an increase in dose for a sarcoid flare. She had weight gain on the 20 mg dose and she was upset about weight gain. Marland KitchenShe was asked  to follow up in 6 weeks, which she did not do. She is here today for follow up 4 months later. She states she is currently on .  What is current dose of prednisone Use previous note for DM and asthma/ sarcoid follow up  Test Results: HRCT chest 04/29/17 >> atherosclerosis, patchy nodular thickening of interstitium, mild traction BTX, patchy air trapping, splenomegaly CT chest shows changes consistent with progression of sarcoidosis.   CT abd/pelvis 03/30/17 which showed splenomegaly and adenopathy. Also showed patchy nodule at right lung base. 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  Auto Immune Testing: 04/21/2017 Sed Rate 130  Pulmonary tests EBUS May 2015 >> granulomas PFT February 2015 >> DLCO 60% PFT 04/2017>> DLCO>> 58%, moderate restriction, Minimal BD response ( ? The impact of obesity on results)  Sleep tests BiPAP 10/07/16 to 11/05/16 >>used 29 of 30 nights with average 9 hrs 51 min. Average AHI 0.4 with Bipap 16/12 cm H2O BIPAP>> 05/31/2017-06/29/2017>> used 25 of 30 days with average usage of 9 hours each night, AHI is 0.5   CBC Latest Ref Rng & Units 08/08/2017 04/21/2017 03/30/2017  WBC 3.9 - 10.3 K/uL 6.7 5.1 5.3  Hemoglobin 12.0 - 15.0 g/dL - 9.9(L) 10.1(L)  Hematocrit 34.8 - 46.6 % 38.3 32.6(L) 32.7(L)  Platelets 145 - 400 K/uL 189 258.0 245    BMP Latest Ref Rng & Units 04/21/2017 03/30/2017 07/13/2014  Glucose 70 - 99 mg/dL 175(H) 183(H) 285(H)  BUN 6 - 23 mg/dL 13 13 19   Creatinine  0.40 - 1.20 mg/dL 0.82 0.74 0.85  Sodium 135 - 145 mEq/L 138 135 137  Potassium 3.5 - 5.1 mEq/L 4.1 3.9 4.4  Chloride 96 - 112 mEq/L 103 100(L) 98  CO2 19 - 32 mEq/L 26 25 23   Calcium 8.4 - 10.5 mg/dL 10.5 10.7(H) 9.6    BNP No results found for: BNP  ProBNP    Component Value Date/Time   PROBNP 175.8 (H) 07/10/2014 1320    PFT    Component Value Date/Time   FEV1PRE 1.19 05/19/2017 1035   FEV1POST 1.17 05/19/2017 1035   FVCPRE 1.33 05/19/2017 1035   FVCPOST 1.26 05/19/2017 1035   TLC 2.52 05/19/2017 1035   DLCOUNC 11.09 05/19/2017 1035   PREFEV1FVCRT 90 05/19/2017 1035   PSTFEV1FVCRT 93 05/19/2017 1035    No results found.   Past medical hx Past Medical History:  Diagnosis Date  . Anxiety   . Asthma   . Bone pain 06/07/2013  . Depression   . 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 Hernia of abdominal cavity 10/25/2013  . Hypertension   . Iron deficiency anemia due to chronic blood loss   . Lymphadenopathy 06/07/2013  . Menometrorrhagia   . Obesity 12/18/2012  . Psoriasis   . Sarcoidosis 07/26/2013  . Sleep apnea   . Unspecified deficiency anemia 07/26/2013     Social History   Tobacco Use  . Smoking status: Former Smoker  Packs/day: 0.25    Years: 2.00    Pack years: 0.50    Types: Cigars    Last attempt to quit: 06/30/2017    Years since quitting: 0.3  . Smokeless tobacco: Never Used  . Tobacco comment: Smokes Marijuana (once daily)  Substance Use Topics  . Alcohol use: Yes    Alcohol/week: 0.0 oz    Comment: all monthly  . Drug use: No    Frequency: 7.0 times per week    Types: Marijuana    Comment: Marijuana--once a week    Ms.Lenderman reports that she quit smoking about 3 months ago. Her smoking use included cigars. She has a 0.50 pack-year smoking history. She has never used smokeless tobacco. She reports that she drinks alcohol. She reports that she does not use drugs.  Tobacco Cessation: Current every day smoker + Pot smoker   I  have spent 4 minutes counseling patient on smoking cessation this visit.  Past surgical hx, Family hx, Social hx all reviewed.  Current Outpatient Medications on File Prior to Visit  Medication Sig  . acetaminophen (TYLENOL) 500 MG tablet Take 500 mg by mouth every 6 (six) hours as needed for mild pain.  [name] albuterol (PROVENTIL HFA;VENTOLIN HFA) 108 (90 Base) MCG/ACT inhaler Inhale 1-2 puffs into the lungs every 6 (six) hours as needed for wheezing or shortness of breath.  [name] albuterol (PROVENTIL) (2.5 MG/3ML) 0.083% nebulizer solution Take 3 mLs (2.5 mg total) by nebulization every 6 (six) hours as needed for wheezing or shortness of breath.  [name] amLODipine (NORVASC) 10 MG tablet Take 1 tablet (10 mg total) by mouth daily.  . budesonide-formoterol (SYMBICORT) 160-4.5 MCG/ACT inhaler Inhale 2 puffs into the lungs 2 (two) times daily.  [name] labetalol (NORMODYNE) 100 MG tablet Take 1 tablet (100 mg total) by mouth 2 (two) times daily.  [name] losartan-hydrochlorothiazide (HYZAAR) 100-25 MG per tablet Take 1 tablet by mouth daily.  . metFORMIN (GLUCOPHAGE) 1000 MG tablet Take 1,000 mg by mouth daily with breakfast.   . naproxen (NAPROSYN) 500 MG tablet Take 1 tablet (500 mg total) by mouth 2 (two) times daily as needed for mild pain, moderate pain or headache (TAKE WITH MEALS.).  [name] nystatin (MYCOSTATIN) 100000 UNIT/ML suspension Use 39ml three times a day for 2 weeks.  [name] oxyCODONE-acetaminophen (PERCOCET/ROXICET) 5-325 MG tablet Take 1 tablet by mouth every 8 (eight) hours as needed for severe pain.  . predniSONE (DELTASONE) 10 MG tablet Take 1 tablet daily.   No current facility-administered medications on file prior to visit.      No Known Allergies  Review Of Systems:  Constitutional:   No  weight loss, night sweats,  Fevers, chills, fatigue, or  lassitude.  HEENT:   No headaches,  Difficulty swallowing,  Tooth/dental problems, or  Sore throat,                No sneezing, itching, ear ache, nasal  congestion, post nasal drip,   CV:  No chest pain,  Orthopnea, PND, swelling in lower extremities, anasarca, dizziness, palpitations, syncope.   GI  No heartburn, indigestion, abdominal pain, nausea, vomiting, diarrhea, change in bowel habits, loss of appetite, bloody stools.   Resp: No shortness of breath with exertion or at rest.  No excess mucus, no productive cough,  No non-productive cough,  No coughing up of blood.  No change in color of mucus.  No wheezing.  No chest wall deformity  Skin: no rash or lesions.  GU: no dysuria,  change in color of urine, no urgency or frequency.  No flank pain, no hematuria   MS:  No joint pain or swelling.  No decreased range of motion.  No back pain.  Psych:  No change in mood or affect. No depression or anxiety.  No memory loss.   Vital Signs There were no vitals taken for this visit.   Physical Exam:  General- No distress,  A&Ox3, pleasant ENT: No sinus tenderness, TM clear, pale nasal mucosa, no oral exudate,no post nasal drip, no LAN Cardiac: S1, S2, regular rate and rhythm, no murmur Chest: No wheeze/ rales/ dullness; no accessory muscle use, no nasal flaring, no sternal retractions Abd.: Soft Non-tender, ND, Obese Ext: No clubbing cyanosis, edema Neuro:  normal strength, Alert and oriented x 3, MAE x 4 Skin: No rashes, warm and dry Psych: normal mood and behavior   Assessment/Plan  No problem-specific Assessment & Plan notes found for this encounter.    Magdalen Spatz, NP 10/27/2017  10:05 AM

## 2017-10-30 ENCOUNTER — Ambulatory Visit: Payer: Medicare HMO | Admitting: Acute Care

## 2017-10-30 ENCOUNTER — Encounter: Payer: Self-pay | Admitting: Acute Care

## 2017-10-30 ENCOUNTER — Other Ambulatory Visit: Payer: Medicare HMO

## 2017-10-30 VITALS — BP 138/80 | HR 103 | Ht 59.0 in | Wt 228.8 lb

## 2017-10-30 DIAGNOSIS — D869 Sarcoidosis, unspecified: Secondary | ICD-10-CM | POA: Diagnosis not present

## 2017-10-30 DIAGNOSIS — G4733 Obstructive sleep apnea (adult) (pediatric): Secondary | ICD-10-CM | POA: Diagnosis not present

## 2017-10-30 NOTE — Assessment & Plan Note (Signed)
Compliant with BiPAP 97% AHI 0.4 Plan:   Continue on BiPAP at bedtime. You appear to be benefiting from the treatment Goal is to wear for at least 6 hours each night for maximal clinical benefit. Continue to work on weight loss, as the link between excess weight  and sleep apnea is well established.  Do not drive if sleepy. Remember to clean mask, tubing, filter, and reservoir once weekly with soapy water.  Follow up with Dr. Halford Chessman  In  8 weeks or before as needed.  Please contact office for sooner follow up if symptoms do not improve or worsen or seek emergency care

## 2017-10-30 NOTE — Patient Instructions (Addendum)
It good to see you today. ACE level today. Try to drop prednisone 5 mg x 1 week. If you are doing well, then we will stay at 5 mg until your return for follow up. If you feel that dose is too low.then start 7.5 mg ( 1.5) 5 mg tabs daily until we see you again. Remember to use your Symbicort 2 puffs twice daily every day without fail Rinse mouth after use. Follow up with PCP about your back pain Follow up with Dr. Halford Chessman or Judson Roch NP in 8 weeks. Please contact office for sooner follow up if symptoms do not improve or worsen or seek emergency care

## 2017-10-30 NOTE — Progress Notes (Signed)
History of Present Illness Cassandra Burns is a 52 y.o. female Former  Smoker( Quit 07/2017 ) [place], asthma and sarcoid.She is followed by Dr. Halford Chessman.   10/30/2017  One month follow up. Pt. Was seen 09/29/2017. Prednisone had been tapered from 20 mg daily to 10 mg daily and she stated she felt she needed the higher dose of prednisone. She Was not compliant with her maintenance Symbicort daily, so I was unsure if the dyspnea was 2/2 her sarcoid or her asthma . She did endorse wheezing at the time. Our plan after that visit was to continue on the Symbicort 10 mg a day, but to use her Symbicort inhaler daily without fail. If she continued to have worsening dyspnea despite compliance with her Symbicort, , I told her to increase prednisone to 15 mg a day. Additionally , we asked her to be more compliant with her BP medication and to follow up with her PCP regarding BP control. She presents today stating  She is doing well. She states she remains smoke free. She has started to go to the gym.She states her shortness of breath is better now that she is using her Symbicort on a daily basis. She remains on the 10 mg prednisone daily.She does have a rescue inhaler but she uses it infrequently.She denies fever, chest pain, orthopnea or hemoptysis. She wants to get prednisone  to lower dose. She is compliant with her BiPAP, AirCurve 10 VAuto : IPAP 16 cm H2O, EPAP 12 cm H2O. Down Load 3/5-4/3,2019>> No issues. Wears it 97% of the time for over 8 hours every night AHI is 0.4  Test Results: 10/29/2017>> ACE  HRCT chest 04/29/17 >> atherosclerosis, patchy nodular thickening of interstitium, mild traction BTX, patchy air trapping, splenomegaly CT chest shows changes consistent with progression of sarcoidosis.   CT abd/pelvis 03/30/17 which showed splenomegaly and adenopathy. Also showed patchy nodule at right lung base. 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  Auto  Immune Testing: 04/21/2017 Sed Rate 130  Pulmonary tests EBUS May 2015 >> granulomas PFT February 2015 >> DLCO 60% PFT 04/2017>> DLCO>> 58%, moderate restriction, Minimal BD response ( ? The impact of obesity on results)  Sleep tests BiPAP 10/07/16 to 11/05/16 >>used 29 of 30 nights with average 9 hrs 51 min. Average AHI 0.4 with Bipap 16/12 cm H2O BIPAP>> 05/31/2017-06/29/2017>> used 25 of 30 days with average usage of 9 hours each night, AHI is 0.5   CBC Latest Ref Rng & Units 08/08/2017 04/21/2017 03/30/2017  WBC 3.9 - 10.3 K/uL 6.7 5.1 5.3  Hemoglobin 12.0 - 15.0 g/dL - 9.9(L) 10.1(L)  Hematocrit 34.8 - 46.6 % 38.3 32.6(L) 32.7(L)  Platelets 145 - 400 K/uL 189 258.0 245    BMP Latest Ref Rng & Units 04/21/2017 03/30/2017 07/13/2014  Glucose 70 - 99 mg/dL 175(H) 183(H) 285(H)  BUN 6 - 23 mg/dL 13 13 19   Creatinine 0.40 - 1.20 mg/dL 0.82 0.74 0.85  Sodium 135 - 145 mEq/L 138 135 137  Potassium 3.5 - 5.1 mEq/L 4.1 3.9 4.4  Chloride 96 - 112 mEq/L 103 100(L) 98  CO2 19 - 32 mEq/L 26 25 23   Calcium 8.4 - 10.5 mg/dL 10.5 10.7(H) 9.6    BNP No results found for: BNP  ProBNP    Component Value Date/Time   PROBNP 175.8 (H) 07/10/2014 1320    PFT    Component Value Date/Time   FEV1PRE 1.19 05/19/2017 1035   FEV1POST 1.17 05/19/2017 1035  FVCPRE 1.33 05/19/2017 1035   FVCPOST 1.26 05/19/2017 1035   TLC 2.52 05/19/2017 1035   DLCOUNC 11.09 05/19/2017 1035   PREFEV1FVCRT 90 05/19/2017 1035   PSTFEV1FVCRT 93 05/19/2017 1035    No results found.   Past medical hx Past Medical History:  Diagnosis Date  . Anxiety   . Asthma   . Bone pain 06/07/2013  . Depression   . 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 Hernia of abdominal cavity 10/25/2013  . Hypertension   . Iron deficiency anemia due to chronic blood loss   . Lymphadenopathy 06/07/2013  . Menometrorrhagia   . Obesity 12/18/2012  . Psoriasis   . Sarcoidosis 07/26/2013  . Sleep apnea   . Unspecified deficiency  anemia 07/26/2013     Social History   Tobacco Use  . Smoking status: Former Smoker    Packs/day: 0.25    Years: 2.00    Pack years: 0.50    Types: Cigars    Last attempt to quit: 06/30/2017    Years since quitting: 0.3  . Smokeless tobacco: Never Used  . Tobacco comment: Smokes Marijuana (once daily)  Substance Use Topics  . Alcohol use: Yes    Alcohol/week: 0.0 oz    Comment: all monthly  . Drug use: No    Frequency: 7.0 times per week    Types: Marijuana    Comment: Marijuana--once a week    Ms.Attridge reports that she quit smoking about 4 months ago. Her smoking use included cigars. She has a 0.50 pack-year smoking history. She has never used smokeless tobacco. She reports that she drinks alcohol. She reports that she does not use drugs.  Tobacco Cessation: States she has recently quit smoking.  Past surgical hx, Family hx, Social hx all reviewed.  Current Outpatient Medications on File Prior to Visit  Medication Sig  . acetaminophen (TYLENOL) 500 MG tablet Take 500 mg by mouth every 6 (six) hours as needed for mild pain.  [name] albuterol (PROVENTIL HFA;VENTOLIN HFA) 108 (90 Base) MCG/ACT inhaler Inhale 1-2 puffs into the lungs every 6 (six) hours as needed for wheezing or shortness of breath.  [name] albuterol (PROVENTIL) (2.5 MG/3ML) 0.083% nebulizer solution Take 3 mLs (2.5 mg total) by nebulization every 6 (six) hours as needed for wheezing or shortness of breath.  [name] amLODipine (NORVASC) 10 MG tablet Take 1 tablet (10 mg total) by mouth daily.  . budesonide-formoterol (SYMBICORT) 160-4.5 MCG/ACT inhaler Inhale 2 puffs into the lungs 2 (two) times daily.  [name] labetalol (NORMODYNE) 100 MG tablet Take 1 tablet (100 mg total) by mouth 2 (two) times daily.  [name] losartan-hydrochlorothiazide (HYZAAR) 100-25 MG per tablet Take 1 tablet by mouth daily.  . metFORMIN (GLUCOPHAGE) 1000 MG tablet Take 1,000 mg by mouth daily with breakfast.   . naproxen (NAPROSYN) 500 MG tablet Take 1  tablet (500 mg total) by mouth 2 (two) times daily as needed for mild pain, moderate pain or headache (TAKE WITH MEALS.).  [name] nystatin (MYCOSTATIN) 100000 UNIT/ML suspension Use 12ml three times a day for 2 weeks.  [name] oxyCODONE-acetaminophen (PERCOCET/ROXICET) 5-325 MG tablet Take 1 tablet by mouth every 8 (eight) hours as needed for severe pain.  . predniSONE (DELTASONE) 10 MG tablet Take 1 tablet daily.   No current facility-administered medications on file prior to visit.      No Known Allergies  Review Of Systems:  Constitutional:   No  weight loss, night sweats,  Fevers, chills, +fatigue, or  lassitude.  HEENT:   No headaches,  Difficulty swallowing,  Tooth/dental problems, or  Sore throat,                No sneezing, itching, ear ache, nasal congestion, post nasal drip,   CV:  No chest pain,  Orthopnea, PND, swelling in lower extremities, anasarca, dizziness, palpitations, syncope.   GI  No heartburn, indigestion, abdominal pain, nausea, vomiting, diarrhea, change in bowel habits, loss of appetite, bloody stools.   Resp: No shortness of breath with exertion or at rest.  No excess mucus, no productive cough,  No non-productive cough,  No coughing up of blood.  No change in color of mucus.  No wheezing.  No chest wall deformity  Skin: no rash or lesions.  GU: no dysuria, change in color of urine, no urgency or frequency.  No flank pain, no hematuria   MS:  No joint pain or swelling.  No decreased range of motion.  No back pain.  Psych:  No change in mood or affect. No depression or anxiety.  No memory loss.   Vital Signs BP 138/80 (BP Location: Right Arm, Cuff Size: Normal)   Pulse (!) 103   Ht 4\' 11"  (1.499 m)   Wt 228 lb 12.8 oz (103.8 kg)   SpO2 92%   BMI 46.21 kg/m    Physical Exam:  General- No distress,  A&Ox3, pleasant ENT: No sinus tenderness, TM clear, pale nasal mucosa, no oral exudate,no post nasal drip, no LAN, thick neck, moon face Cardiac: S1, S2, regular  rate and rhythm, no murmur Chest: No wheeze/ rales/ dullness; no accessory muscle use, no nasal flaring, no sternal retractions, diminished per bases Abd.: Soft Non-tender, obese, ND Ext: No clubbing cyanosis, edema Neuro:  normal strength, MAE x 4, A&O x 3 Skin: No rashes, warm and dry Psych: normal mood and behavior   Assessment/Plan  Sarcoidosis Improved dyspnea with daily compliance of maintenance Symbicort Weaned to 10 mg / day prednisone Plan: ACE level today. Try to drop prednisone 5 mg x 1 week. If you are doing well, then we will stay at 5 mg until your return for follow up. If you feel that dose is too low.then start 7.5 mg ( 1.5) 5 mg tabs daily until we see you again. Remember to use your Symbicort 2 puffs twice daily every day without fail Rinse mouth after use. Follow up with PCP about your back pain Follow up with Dr. Halford Chessman or Judson Roch NP in 8 weeks. Please contact office for sooner follow up if symptoms do not improve or worsen or seek emergency care    OSA (obstructive sleep apnea) Compliant with BiPAP 97% AHI 0.4 Plan:   Continue on BiPAP at bedtime. You appear to be benefiting from the treatment Goal is to wear for at least 6 hours each night for maximal clinical benefit. Continue to work on weight loss, as the link between excess weight  and sleep apnea is well established.  Do not drive if sleepy. Remember to clean mask, tubing, filter, and reservoir once weekly with soapy water.  Follow up with Dr. Halford Chessman  In  8 weeks or before as needed.  Please contact office for sooner follow up if symptoms do not improve or worsen or seek emergency care     Magdalen Spatz, NP 10/30/2017  4:14 PM

## 2017-10-30 NOTE — Assessment & Plan Note (Signed)
Improved dyspnea with daily compliance of maintenance Symbicort Weaned to 10 mg / day prednisone Plan: ACE level today. Try to drop prednisone 5 mg x 1 week. If you are doing well, then we will stay at 5 mg until your return for follow up. If you feel that dose is too low.then start 7.5 mg ( 1.5) 5 mg tabs daily until we see you again. Remember to use your Symbicort 2 puffs twice daily every day without fail Rinse mouth after use. Follow up with PCP about your back pain Follow up with Dr. Halford Chessman or Judson Roch NP in 8 weeks. Please contact office for sooner follow up if symptoms do not improve or worsen or seek emergency care

## 2017-10-31 LAB — ANGIOTENSIN CONVERTING ENZYME: Angiotensin-Converting Enzyme: 30 U/L (ref 9–67)

## 2017-11-06 ENCOUNTER — Other Ambulatory Visit: Payer: Self-pay | Admitting: Hematology and Oncology

## 2017-11-06 DIAGNOSIS — D500 Iron deficiency anemia secondary to blood loss (chronic): Secondary | ICD-10-CM

## 2017-11-07 ENCOUNTER — Telehealth: Payer: Self-pay

## 2017-11-07 ENCOUNTER — Inpatient Hospital Stay: Payer: Medicare HMO | Attending: Hematology and Oncology

## 2017-11-07 DIAGNOSIS — D500 Iron deficiency anemia secondary to blood loss (chronic): Secondary | ICD-10-CM | POA: Diagnosis not present

## 2017-11-07 LAB — CBC WITH DIFFERENTIAL/PLATELET
BASOS ABS: 0 10*3/uL (ref 0.0–0.1)
BASOS PCT: 0 %
EOS PCT: 2 %
Eosinophils Absolute: 0.1 10*3/uL (ref 0.0–0.5)
HCT: 38.6 % (ref 34.8–46.6)
Hemoglobin: 11.8 g/dL (ref 11.6–15.9)
Lymphocytes Relative: 30 %
Lymphs Abs: 2 10*3/uL (ref 0.9–3.3)
MCH: 23.7 pg — ABNORMAL LOW (ref 25.1–34.0)
MCHC: 30.6 g/dL — ABNORMAL LOW (ref 31.5–36.0)
MCV: 77.5 fL — ABNORMAL LOW (ref 79.5–101.0)
MONO ABS: 0.5 10*3/uL (ref 0.1–0.9)
Monocytes Relative: 8 %
Neutro Abs: 4 10*3/uL (ref 1.5–6.5)
Neutrophils Relative %: 60 %
PLATELETS: 213 10*3/uL (ref 145–400)
RBC: 4.98 MIL/uL (ref 3.70–5.45)
RDW: 15.8 % — AB (ref 11.2–14.5)
WBC: 6.7 10*3/uL (ref 3.9–10.3)

## 2017-11-07 LAB — IRON AND TIBC
IRON: 57 ug/dL (ref 41–142)
Saturation Ratios: 17 % — ABNORMAL LOW (ref 21–57)
TIBC: 345 ug/dL (ref 236–444)
UIBC: 288 ug/dL

## 2017-11-07 LAB — FERRITIN: FERRITIN: 215 ng/mL (ref 9–269)

## 2017-11-07 NOTE — Telephone Encounter (Signed)
Called and given below message. Verbalized understanding. She will have her PCP draw her labs. Instructed to call office if needed.

## 2017-11-07 NOTE — Telephone Encounter (Signed)
-----   Message from Heath Lark, MD sent at 11/07/2017 12:39 PM EDT ----- Regarding: Iron studies are ok PLs let her know iron studies are good and she is not anemic No need IV iron Can PCP draw her labs or does she needs to come bacK? If she wants to come back, recheck labs only in 6 mo ----- Message ----- From: [place]: 11/07/2017  10:58 AM To: Heath Lark, MD

## 2017-12-29 ENCOUNTER — Encounter: Payer: Self-pay | Admitting: Acute Care

## 2017-12-29 ENCOUNTER — Ambulatory Visit (INDEPENDENT_AMBULATORY_CARE_PROVIDER_SITE_OTHER): Payer: Medicare HMO | Admitting: Acute Care

## 2017-12-29 DIAGNOSIS — D869 Sarcoidosis, unspecified: Secondary | ICD-10-CM | POA: Diagnosis not present

## 2017-12-29 DIAGNOSIS — G4733 Obstructive sleep apnea (adult) (pediatric): Secondary | ICD-10-CM

## 2017-12-29 MED ORDER — PREDNISONE 2.5 MG PO TABS: 2.5000 mg | ORAL_TABLET | Freq: Every day | ORAL | 0 refills | Status: DC

## 2017-12-29 NOTE — Progress Notes (Signed)
p 

## 2017-12-29 NOTE — Progress Notes (Signed)
History of Present Illness Cassandra Burns is a 52 y.o. female with sarcoidosis and OSA on CPAP, and asthma. She is followed by Dr. Halford Chessman.   12/29/2017  Follow up: Pt. Presents for follow up . Pt. Has been slow to wean off her prednisone after a flare of her sarcoid.  She was seen on 10/30/2017 and she was decreased to 5 mg prednisone and she has been doing well with the 5 mg dose. She states she has skipped days and she didn't notice any difference. ACE at her last OV was 30.  She is compliant with her Symbicort daily.She has only had to use her rescue inhaler on rare occasions, usually after climbing stairs. She states she has been complaint with her BiPAP. Most recent down Load does not show compliance, but pt.  was in [place] x 6 weeks and she states she was using an old machine while out of town. She has good control when  using her machine. She is working on weight loss. She feels this will be more attainable once she is off prednisone. She denies fever, chest pain, orthopnea of hemoptysis.   Test Results: Down Load 11/28/2017-12/27/2017>> BiPAP Spontaneous mode IPAP is 16 cm pressure EPAP is 12 cm pressure Easy Breath : On Usage 7/30 days or 23 % > 4 hours is 6 days or 20% < 4 hours is 1 days or 3%. AHI is 0.3 * Again please note patient states she wore old BiPAP while traveling to [place] x 6 weeks which is why usage is noted as less that 100%   10/29/2017>> ACE>> 30  HRCT chest 04/29/17>>atherosclerosis, patchy nodular thickening of interstitium, mild traction BTX, patchy air trapping, splenomegaly CT chest shows changes consistent with progression of sarcoidosis.   CT abd/pelvis 03/30/17 which showed splenomegaly and adenopathy. Also showed patchy nodule at right lung base. 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  Auto Immune Testing:04/21/2017 Sed Rate 130  Pulmonary tests EBUS May 2015 >> granulomas PFT February 2015 >>  DLCO 60% PFT 04/2017>> DLCO>> 58%, moderate restriction, Minimal BD response ( ? The impact of obesity on results)  Sleep tests BiPAP 10/07/16 to 11/05/16 >>used 29 of 30 nights with average 9 hrs 51 min. Average AHI 0.4 with Bipap 16/12 cm H2O BIPAP>> 05/31/2017-06/29/2017>> used 25 of 30 days with average usage of 9 hours each night, AHI is 0.5    CBC Latest Ref Rng & Units 11/07/2017 08/08/2017 04/21/2017  WBC 3.9 - 10.3 K/uL 6.7 6.7 5.1  Hemoglobin 11.6 - 15.9 g/dL 11.8 11.7 9.9(L)  Hematocrit 34.8 - 46.6 % 38.6 38.3 32.6(L)  Platelets 145 - 400 K/uL 213 189 258.0    BMP Latest Ref Rng & Units 04/21/2017 03/30/2017 07/13/2014  Glucose 70 - 99 mg/dL 175(H) 183(H) 285(H)  BUN 6 - 23 mg/dL 13 13 19   Creatinine 0.40 - 1.20 mg/dL 0.82 0.74 0.85  Sodium 135 - 145 mEq/L 138 135 137  Potassium 3.5 - 5.1 mEq/L 4.1 3.9 4.4  Chloride 96 - 112 mEq/L 103 100(L) 98  CO2 19 - 32 mEq/L 26 25 23   Calcium 8.4 - 10.5 mg/dL 10.5 10.7(H) 9.6    BNP No results found for: BNP  ProBNP    Component Value Date/Time   PROBNP 175.8 (H) 07/10/2014 1320    PFT    Component Value Date/Time   FEV1PRE 1.19 05/19/2017 1035   FEV1POST 1.17 05/19/2017 1035   FVCPRE 1.33 05/19/2017 1035   FVCPOST 1.26  05/19/2017 1035   TLC 2.52 05/19/2017 1035   DLCOUNC 11.09 05/19/2017 1035   PREFEV1FVCRT 90 05/19/2017 1035   PSTFEV1FVCRT 93 05/19/2017 1035    No results found.   Past medical hx Past Medical History:  Diagnosis Date  . Anxiety   . Asthma   . Bone pain 06/07/2013  . Depression   . 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 Hernia of abdominal cavity 10/25/2013  . Hypertension   . Iron deficiency anemia due to chronic blood loss   . Lymphadenopathy 06/07/2013  . Menometrorrhagia   . Obesity 12/18/2012  . Psoriasis   . Sarcoidosis 07/26/2013  . Sleep apnea   . Unspecified deficiency anemia 07/26/2013     Social History   Tobacco Use  . Smoking status: Former Smoker    Packs/day: 0.25      Years: 2.00    Pack years: 0.50    Types: Cigars    Last attempt to quit: 06/30/2017    Years since quitting: 0.4  . Smokeless tobacco: Never Used  . Tobacco comment: Smokes Marijuana (once daily)  Substance Use Topics  . Alcohol use: Yes    Alcohol/week: 0.0 oz    Comment: all monthly  . Drug use: No    Frequency: 7.0 times per week    Types: Marijuana    Comment: Marijuana--once a week    [name] reports that she quit smoking about 5 months ago. Her smoking use included cigars. She has a 0.50 pack-year smoking history. She has never used smokeless tobacco. She reports that she drinks alcohol. She reports that she does not use drugs.  Tobacco Cessation: Former smoker quit 2018.  Past surgical hx, Family hx, Social hx all reviewed.  Current Outpatient Medications on File Prior to Visit  Medication Sig  . acetaminophen (TYLENOL) 500 MG tablet Take 500 mg by mouth every 6 (six) hours as needed for mild pain.  [name] albuterol (PROVENTIL HFA;VENTOLIN HFA) 108 (90 Base) MCG/ACT inhaler Inhale 1-2 puffs into the lungs every 6 (six) hours as needed for wheezing or shortness of breath.  [name] albuterol (PROVENTIL) (2.5 MG/3ML) 0.083% nebulizer solution Take 3 mLs (2.5 mg total) by nebulization every 6 (six) hours as needed for wheezing or shortness of breath.  [name] amLODipine (NORVASC) 10 MG tablet Take 1 tablet (10 mg total) by mouth daily.  . budesonide-formoterol (SYMBICORT) 160-4.5 MCG/ACT inhaler Inhale 2 puffs into the lungs 2 (two) times daily.  [name] labetalol (NORMODYNE) 100 MG tablet Take 1 tablet (100 mg total) by mouth 2 (two) times daily.  [name] losartan-hydrochlorothiazide (HYZAAR) 100-25 MG per tablet Take 1 tablet by mouth daily.  . metFORMIN (GLUCOPHAGE) 1000 MG tablet Take 1,000 mg by mouth daily with breakfast.   . naproxen (NAPROSYN) 500 MG tablet Take 1 tablet (500 mg total) by mouth 2 (two) times daily as needed for mild pain, moderate pain or headache (TAKE WITH MEALS.).  [name]  nystatin (MYCOSTATIN) 100000 UNIT/ML suspension Use 27ml three times a day for 2 weeks.  [name] oxyCODONE-acetaminophen (PERCOCET/ROXICET) 5-325 MG tablet Take 1 tablet by mouth every 8 (eight) hours as needed for severe pain.   No current facility-administered medications on file prior to visit.      No Known Allergies  Review Of Systems:  Constitutional:   No  weight loss, night sweats,  Fevers, chills, +fatigue, or  lassitude.  HEENT:   No headaches,  Difficulty swallowing,  Tooth/dental problems, or  Sore throat,  No sneezing, itching, ear ache, nasal congestion, post nasal drip,   CV:  No chest pain,  Orthopnea, PND, swelling in lower extremities, anasarca, dizziness, palpitations, syncope.   GI  No heartburn, indigestion, abdominal pain, nausea, vomiting, diarrhea, change in bowel habits, loss of appetite, bloody stools.   Resp: + shortness of breath with exertion not  at rest.  No excess mucus, no productive cough,  No non-productive cough,  No coughing up of blood.  No change in color of mucus.  No wheezing.  No chest wall deformity  Skin: no rash or lesions.  GU: no dysuria, change in color of urine, no urgency or frequency.  No flank pain, no hematuria   MS:  No joint pain or swelling.  No decreased range of motion.  No back pain.  Psych:  No change in mood or affect. No depression or anxiety.  No memory loss.   Vital Signs BP (!) 152/64 (BP Location: Left Arm, Cuff Size: Normal)   Pulse 97   Ht 4\' 11"  (1.499 m)   Wt 229 lb (103.9 kg)   SpO2 94%   BMI 46.25 kg/m    Physical Exam:  General- No distress,  A&Ox3, pleasant ENT: No sinus tenderness, TM clear, pale nasal mucosa, no oral exudate,no post nasal drip, no LAN Cardiac: S1, S2, regular rate and rhythm, no murmur Chest: No wheeze/ rales/ dullness; no accessory muscle use, no nasal flaring, no sternal retractions, diminished per bases Abd.: Soft Non-tender, ND, Obese, BS + Ext: No clubbing cyanosis,  trace edema Neuro:  normal strength, MAE x 4, O&A x 3, deconditioned Skin: No rashes, warm and dry Psych: normal mood and behavior   Assessment/Plan  Sarcoidosis Slow to resolve flare Weaned to 5 mg daily with no issues ACE is 30 Plan: We will drop prednisone to 2.5 mg a day x 1 week, and then stop. Goal is to keep you off the prednisone.if possible. If you flare again start back at 2.5 . mg a day and call us to be seen. Contiue Symbicort 2 puffs twice daily Rinse mouth after use. Use Proventil as needed for breakthrough shortness of breath. Work on weight loss as able. Follow up in 3 months with Sarah or Dr. Halford Chessman Please contact office for sooner follow up if symptoms do not improve or worsen or seek emergency care    OSA (obstructive sleep apnea) Compliant with BiPAP Plan: Continue on BiPAP at bedtime. You appear to be benefiting from the treatment Goal is to wear for at least 6 hours each night for maximal clinical benefit. Continue to work on weight loss, as the link between excess weight  and sleep apnea is well established.  Do not drive if sleepy. Remember to clean mask, tubing, filter, and reservoir once weekly with soapy water.  Follow up with Dr. Halford Chessman in 3 months or before as needed.  Please contact office for sooner follow up if symptoms do not improve or worsen or seek emergency care      Magdalen Spatz, NP 12/29/2017  3:11 PM

## 2017-12-29 NOTE — Assessment & Plan Note (Addendum)
Slow to resolve flare Weaned to 5 mg daily with no issues ACE is 30 Plan: We will drop prednisone to 2.5 mg a day x 1 week, and then stop. Goal is to keep you off the prednisone.if possible. If you flare again start back at 2.5 . mg a day and call us to be seen. Contiue Symbicort 2 puffs twice daily Rinse mouth after use. Use Proventil as needed for breakthrough shortness of breath. Work on weight loss as able. Follow up in 3 months with Jaquel Glassburn or Dr. Halford Chessman Please contact office for sooner follow up if symptoms do not improve or worsen or seek emergency care

## 2017-12-29 NOTE — Assessment & Plan Note (Signed)
Compliant with BiPAP Plan: Continue on BiPAP at bedtime. You appear to be benefiting from the treatment Goal is to wear for at least 6 hours each night for maximal clinical benefit. Continue to work on weight loss, as the link between excess weight  and sleep apnea is well established.  Do not drive if sleepy. Remember to clean mask, tubing, filter, and reservoir once weekly with soapy water.  Follow up with Dr. Halford Chessman in 3 months or before as needed.  Please contact office for sooner follow up if symptoms do not improve or worsen or seek emergency care

## 2017-12-29 NOTE — Patient Instructions (Addendum)
It is good to see you today. We will drop prednisone to 2.5 mg a day x 1 week, and then stop. Goal is to keep you off the prednisone.if possible. If you flare again start back at 2.5 . mg a day and call us to be seen. Contiue Symbicort 2 puffs twice daily Rinse mouth after use. Use Proventil as needed for breakthrough shortness of breath. Work on weight loss as able. Follow up in 3 months with Sarah or Dr. Halford Chessman Please contact office for sooner follow up if symptoms do not improve or worsen or seek emergency care  Continue on BiPAP at bedtime. You appear to be benefiting from the treatment Goal is to wear for at least 6 hours each night for maximal clinical benefit. Continue to work on weight loss, as the link between excess weight  and sleep apnea is well established.  Do not drive if sleepy. Remember to clean mask, tubing, filter, and reservoir once weekly with soapy water.  Follow up with Dr. Halford Chessman in 3 months or before as needed.  Please contact office for sooner follow up if symptoms do not improve or worsen or seek emergency care

## 2018-05-15 ENCOUNTER — Other Ambulatory Visit: Payer: Self-pay | Admitting: Internal Medicine

## 2018-05-15 DIAGNOSIS — Z1231 Encounter for screening mammogram for malignant neoplasm of breast: Secondary | ICD-10-CM

## 2018-06-23 ENCOUNTER — Ambulatory Visit
Admission: RE | Admit: 2018-06-23 | Discharge: 2018-06-23 | Disposition: A | Discharge status: Home / Self Care | Payer: Medicare HMO | Source: Ambulatory Visit | Attending: Internal Medicine | Admitting: Internal Medicine

## 2018-06-23 DIAGNOSIS — Z1231 Encounter for screening mammogram for malignant neoplasm of breast: Secondary | ICD-10-CM

## 2018-08-05 ENCOUNTER — Encounter: Payer: Self-pay | Admitting: Pulmonary Disease

## 2018-08-05 ENCOUNTER — Ambulatory Visit (INDEPENDENT_AMBULATORY_CARE_PROVIDER_SITE_OTHER): Payer: Medicare HMO | Admitting: Pulmonary Disease

## 2018-08-05 VITALS — BP 126/78 | HR 111 | Ht 59.0 in | Wt 202.6 lb

## 2018-08-05 DIAGNOSIS — D869 Sarcoidosis, unspecified: Secondary | ICD-10-CM | POA: Diagnosis not present

## 2018-08-05 DIAGNOSIS — G4730 Sleep apnea, unspecified: Secondary | ICD-10-CM | POA: Diagnosis not present

## 2018-08-05 DIAGNOSIS — M545 Low back pain: Secondary | ICD-10-CM

## 2018-08-05 DIAGNOSIS — G4733 Obstructive sleep apnea (adult) (pediatric): Secondary | ICD-10-CM

## 2018-08-05 DIAGNOSIS — Z23 Encounter for immunization: Secondary | ICD-10-CM | POA: Diagnosis not present

## 2018-08-05 DIAGNOSIS — E669 Obesity, unspecified: Secondary | ICD-10-CM

## 2018-08-05 DIAGNOSIS — J45909 Unspecified asthma, uncomplicated: Secondary | ICD-10-CM | POA: Diagnosis not present

## 2018-08-05 DIAGNOSIS — G8929 Other chronic pain: Secondary | ICD-10-CM

## 2018-08-05 NOTE — Progress Notes (Signed)
[facility]  Chief Complaint  Patient presents with  . Follow-up    Pt doing well overall with cpap machine.    Constitutional:  BP 126/78 (BP Location: Left Arm, Cuff Size: Normal)   Pulse (!) 111   Ht 4\' 11"  (1.499 m)   Wt 202 lb 9.6 oz (91.9 kg)   SpO2 95%   BMI 40.92 kg/m   Past Medical History:  HTN, DM, Depression, anxiety   Brief Summary:  Cassandra Burns is a 53 y.o. female with pulmonary sarcoidosis, asthma, and obstructive sleep apnea.  She has been off prednisone since June.  Has changed her diet and has lost about 25 lbs.  Not having cough, wheeze, sputum, chest pain, fever, hemoptysis, skin rash.  Doesn't feel like her breathing limits her activity.  Uses Bipap nightly.  No issues with mask fit.  Not having sinus congestion, sore throat, dry mouth, or aerophagia.  Has noticed more lower back pain when standing for extending periods of time.    Physical Exam:   Appearance - well kempt   ENMT - clear nasal mucosa, midline nasal  septum, no oral exudates, no LAN, trachea midline  Respiratory - normal chest wall, normal respiratory effort, no accessory muscle use, no wheeze/rales  CV - s1s2 regular rate and rhythm, no murmurs, no peripheral edema, radial pulses symmetric  GI - soft, non tender, no masses  Lymph - no adenopathy noted in neck and axillary areas  MSK - normal gait  Ext - no cyanosis, clubbing, or joint inflammation noted  Skin - no rashes, lesions, or ulcers  Neuro - normal strength, oriented x 3  Psych - normal mood and affect  Assessment/Plan:   Systemic sarcoidosis. - clinically stable - monitor off prednisone - flu shot today - she will check with her PCP about whether she had pneumonia vaccine before  Persistent asthma. - continue symbicort and prn albuterol  Obstructive sleep apnea. - she is compliant with Bipap and reports benefit - will change Bipap to 14/10 cm H2O -  explained that her settings might decrease further as she continues to lose weight  Obesity. - encouraged her to keep up with weight loss efforts  Low back pain. - discussed exercises to help strengthen core muscles - advised her to f/u with PCP if this persists   Patient Instructions  Flu shot today Check with your primary care doctor about whether you had a pneumonia vaccine before, and call to update our office Will have your Bipap setting change to 14/10 cm H2O  Follow up in 6 months    Chesley Mires, MD [place] Pager: [phone] 08/05/2018, 10:15 AM  Flow Sheet     Pulmonary tests:  EBUS May 2015 >> granulomas PFT February 2015 >> DLCO 60% HRCT chest 04/29/17 >> atherosclerosis, patchy nodular thickening of interstitium, mild traction BTX, patchy air trapping, splenomegaly  Sleep tests:  BiPAP 07/05/18 to 08/03/18 >> used on 23 out of 30 days with average 8 hrs 51 min.  Average AHI 0.2 with Bipap 16/12 cm H2O.  Medications:   Allergies as of 08/05/2018   No Known Allergies     Medication List       Accurate as of August 05, 2018 10:15 AM. Always use your most recent med list.        acetaminophen 500 MG tablet Commonly known as:  TYLENOL Take 500 mg by mouth every 6 (six) hours as needed for mild  pain.   albuterol (2.5 MG/3ML) 0.083% nebulizer solution Commonly known as:  PROVENTIL Take 3 mLs (2.5 mg total) by nebulization every 6 (six) hours as needed for wheezing or shortness of breath.   albuterol 108 (90 Base) MCG/ACT inhaler Commonly known as:  PROVENTIL HFA;VENTOLIN HFA Inhale 1-2 puffs into the lungs every 6 (six) hours as needed for wheezing or shortness of breath.   amLODipine 10 MG tablet Commonly known as:  NORVASC Take 1 tablet (10 mg total) by mouth daily.   budesonide-formoterol 160-4.5 MCG/ACT inhaler Commonly known as:  SYMBICORT Inhale 2 puffs into the lungs 2 (two) times daily.   labetalol 100 MG  tablet Commonly known as:  NORMODYNE Take 1 tablet (100 mg total) by mouth 2 (two) times daily.   losartan-hydrochlorothiazide 100-25 MG tablet Commonly known as:  HYZAAR Take 1 tablet by mouth daily.   metFORMIN 1000 MG tablet Commonly known as:  GLUCOPHAGE Take 1,000 mg by mouth daily with breakfast.   naproxen 500 MG tablet Commonly known as:  NAPROSYN Take 1 tablet (500 mg total) by mouth 2 (two) times daily as needed for mild pain, moderate pain or headache (TAKE WITH MEALS.).   nystatin 100000 UNIT/ML suspension Commonly known as:  MYCOSTATIN Use 24ml three times a day for 2 weeks.   oxyCODONE-acetaminophen 5-325 MG tablet Commonly known as:  PERCOCET/ROXICET Take 1 tablet by mouth every 8 (eight) hours as needed for severe pain.   predniSONE 2.5 MG tablet Commonly known as:  DELTASONE Take 1 tablet (2.5 mg total) by mouth daily with breakfast.       Past Surgical History:  She  has a past surgical history that includes Tubal ligation.  Family History:  Her family history includes Alcohol abuse in her father and mother; Asthma in her father; Diabetes type II in her father; Drug abuse in her father and mother; Heart murmur in her father; Hypertension in her father and mother.  Social History:  She  reports that she quit smoking about 13 months ago. Her smoking use included cigars. She has a 0.50 pack-year smoking history. She has never used smokeless tobacco. She reports current alcohol use. She reports that she does not use drugs.

## 2018-08-05 NOTE — Patient Instructions (Signed)
Flu shot today Check with your primary care doctor about whether you had a pneumonia vaccine before, and call to update our office Will have your Bipap setting change to 14/10 cm H2O  Follow up in 6 months

## 2019-06-30 ENCOUNTER — Emergency Department (HOSPITAL_COMMUNITY): Payer: Medicare HMO

## 2019-06-30 ENCOUNTER — Other Ambulatory Visit: Payer: Self-pay

## 2019-06-30 ENCOUNTER — Emergency Department (HOSPITAL_COMMUNITY)
Admission: EM | Admit: 2019-06-30 | Discharge: 2019-07-01 | Disposition: A | Discharge status: Home / Self Care | Payer: Medicare HMO | Attending: Emergency Medicine | Admitting: Emergency Medicine

## 2019-06-30 ENCOUNTER — Encounter (HOSPITAL_COMMUNITY): Payer: Self-pay | Admitting: Emergency Medicine

## 2019-06-30 DIAGNOSIS — R519 Headache, unspecified: Secondary | ICD-10-CM | POA: Diagnosis present

## 2019-06-30 DIAGNOSIS — Z7984 Long term (current) use of oral hypoglycemic drugs: Secondary | ICD-10-CM | POA: Insufficient documentation

## 2019-06-30 DIAGNOSIS — Z79899 Other long term (current) drug therapy: Secondary | ICD-10-CM | POA: Diagnosis not present

## 2019-06-30 DIAGNOSIS — Z87891 Personal history of nicotine dependence: Secondary | ICD-10-CM | POA: Diagnosis not present

## 2019-06-30 DIAGNOSIS — I10 Essential (primary) hypertension: Secondary | ICD-10-CM | POA: Insufficient documentation

## 2019-06-30 DIAGNOSIS — R109 Unspecified abdominal pain: Secondary | ICD-10-CM | POA: Diagnosis not present

## 2019-06-30 DIAGNOSIS — R197 Diarrhea, unspecified: Secondary | ICD-10-CM | POA: Diagnosis not present

## 2019-06-30 DIAGNOSIS — J45909 Unspecified asthma, uncomplicated: Secondary | ICD-10-CM | POA: Insufficient documentation

## 2019-06-30 DIAGNOSIS — E119 Type 2 diabetes mellitus without complications: Secondary | ICD-10-CM | POA: Diagnosis not present

## 2019-06-30 LAB — URINALYSIS, ROUTINE W REFLEX MICROSCOPIC
Bilirubin Urine: NEGATIVE
Glucose, UA: NEGATIVE mg/dL
Hgb urine dipstick: NEGATIVE
Ketones, ur: NEGATIVE mg/dL
Leukocytes,Ua: NEGATIVE
Nitrite: NEGATIVE
Protein, ur: NEGATIVE mg/dL
Specific Gravity, Urine: 1.015 (ref 1.005–1.030)
pH: 6 (ref 5.0–8.0)

## 2019-06-30 LAB — COMPREHENSIVE METABOLIC PANEL
ALT: 18 U/L (ref 0–44)
AST: 35 U/L (ref 15–41)
Albumin: 3.2 g/dL — ABNORMAL LOW (ref 3.5–5.0)
Alkaline Phosphatase: 112 U/L (ref 38–126)
Anion gap: 10 (ref 5–15)
BUN: 21 mg/dL — ABNORMAL HIGH (ref 6–20)
CO2: 21 mmol/L — ABNORMAL LOW (ref 22–32)
Calcium: 10.9 mg/dL — ABNORMAL HIGH (ref 8.9–10.3)
Chloride: 102 mmol/L (ref 98–111)
Creatinine, Ser: 0.98 mg/dL (ref 0.44–1.00)
GFR calc Af Amer: >60 mL/min (ref >60)
GFR calc non Af Amer: >60 mL/min (ref >60)
Glucose, Bld: 128 mg/dL — ABNORMAL HIGH (ref 70–99)
Potassium: 4.9 mmol/L (ref 3.5–5.1)
Sodium: 133 mmol/L — ABNORMAL LOW (ref 135–145)
Total Bilirubin: 0.6 mg/dL (ref 0.3–1.2)
Total Protein: 8.2 g/dL — ABNORMAL HIGH (ref 6.5–8.1)

## 2019-06-30 LAB — CBC
HCT: 37.7 % (ref 36.0–46.0)
Hemoglobin: 10.6 g/dL — ABNORMAL LOW (ref 12.0–15.0)
MCH: 20.6 pg — ABNORMAL LOW (ref 26.0–34.0)
MCHC: 28.1 g/dL — ABNORMAL LOW (ref 30.0–36.0)
MCV: 73.2 fL — ABNORMAL LOW (ref 80.0–100.0)
Platelets: 394 10*3/uL (ref 150–400)
RBC: 5.15 MIL/uL — ABNORMAL HIGH (ref 3.87–5.11)
RDW: 21.2 % — ABNORMAL HIGH (ref 11.5–15.5)
WBC: 7.3 10*3/uL (ref 4.0–10.5)
nRBC: 0 % (ref 0.0–0.2)

## 2019-06-30 LAB — LIPASE, BLOOD: Lipase: 50 U/L (ref 11–51)

## 2019-06-30 LAB — I-STAT BETA HCG BLOOD, ED (MC, WL, AP ONLY): I-stat hCG, quantitative: <5 m[IU]/mL (ref <5)

## 2019-06-30 MED ORDER — SODIUM CHLORIDE 0.9% FLUSH
3.0000 mL | Freq: Once | INTRAVENOUS | Status: AC
  Administered 2019-06-30: 20:00:00 3 mL via INTRAVENOUS

## 2019-06-30 NOTE — ED Notes (Signed)
Patient transported to CT 

## 2019-06-30 NOTE — ED Provider Notes (Signed)
Care assumed from [facility] , [place], at shift change, please see their notes for full documentation of patient's complaint/HPI. Briefly, pt here from urgent care after routine CXR. Reportedly CXR showed aortic aneurysm and pt was sent for further evaluation. Pt also with headache intermittently x 4 months and diffuse abdominal pain/dairrhea x 4 days that has since resolved. Results so far show normal CT head. Awaiting repeat CXR and labwork. Plan is to dispo accordingly.   Physical Exam  BP 136/83   Pulse (!) 104   Temp 98.1 F (36.7 C) (Oral)   Resp (!) 22   SpO2 94%   Physical Exam  ED Course/Procedures     Procedures  MDM  CXR with stable sarcoidosis. CBC with hgb 10.6; stable from baseline. CMP with creatinine 0.98 and mild elevation in BUN 21. Have advised patient increase fluid intake for the next several days. She has an appointment with her PCP on 07/08/2019. Advised to keep. Remainder of bloodwork unremarkable. Pt stable for discharge home at this time.   This note was prepared using Dragon voice recognition software and may include unintentional dictation errors due to the inherent limitations of voice recognition software.        Eustaquio Maize, PA-C 06/30/19 [place], [place], DO 07/05/19 [phone]

## 2019-06-30 NOTE — ED Triage Notes (Signed)
Pt having intermittent headaches for months. ABD pains with diarrhea since Sunday. Had xray at pain management yesterday and was told has aortic aneurysm and should be seen for further evaluation of it.  Thinks it is 4-6 in diameter.

## 2019-06-30 NOTE — ED Provider Notes (Signed)
[facility] DEPT Provider Note   CSN: [id number] Arrival date & time: 06/30/19  1713     History   Chief Complaint Chief Complaint  Patient presents with  . Abdominal Pain  . Headache    HPI LEONITA SIEBEL is a 53 y.o. female.     HPI   Pt is a 53 y/o female with a h/o anxiety, asthma, depression, DM, hernia, sarcoidosis, OSA presenting after she had routine cxr yesterday which reportedly showed an aortic aneurysm. she was advised to come to the ED. She denies chest pain. Does report chronic SOB 2/2 asthma that is unchanged today.   HA: She reports that for the last few months she has had intermittent headaches. Today her headache seemed worse than normal. HA are right sided typically. Pain feels like a burning pain, that is intermittently sharp. She intermittently has pain the shoots up the back of her neck and makes her muscles feel tight bilaterally. Pain rated 9/10. She reports intermittent blurred vision with the headaches. Denies dizziness, numbness, weakness, speech difficulty, or facial droop. States that headache was initially worse but after drinking some of her water the headache resolved and she currently has no pain.   Abd pain: Reports she has had diffuse abd pain for the last 4 days. Pain has been intermittent. Currently does not have pain but did have pain earlier rated 6/10. Pain feels like a crampy pain. Reports associated nausea, diarrhea.  Denies vomiting, constipation, bloody stools. Has mild cough, but denies fever. Denies urinary sxs. Denies any known COVID contacts or contacts with similar.   Pt has pcp appt on 07/08/2019  Past Medical History:  Diagnosis Date  . Anxiety   . Asthma   . Bone pain 06/07/2013  . Depression   . 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 Hernia of abdominal cavity 10/25/2013  . Hypertension   . Iron deficiency anemia due to chronic blood loss   . Lymphadenopathy 06/07/2013  .  Menometrorrhagia   . Obesity 12/18/2012  . Psoriasis   . Sarcoidosis 07/26/2013  . Sleep apnea   . Unspecified deficiency anemia 07/26/2013    Patient Active Problem List   Diagnosis Date Noted  . Tobacco abuse 06/30/2017  . Asthma 05/19/2017  . Cough   . Hypertensive emergency 07/11/2014  . HTN (hypertension)   . Pulmonary edema   . SOB (shortness of breath)   . Hypertensive urgency 07/10/2014  . Rectal bleeding 04/14/2014  . Diastasis recti 11/04/2013  . Hernia of abdominal cavity 10/25/2013  . Sarcoidosis 07/26/2013  . Unspecified deficiency anemia 07/26/2013  . Bone pain 06/07/2013  . Lymphadenopathy 06/07/2013  . Obesity hypoventilation syndrome 04/06/2013  . Depression, major, recurrent, moderate ([place]) 01/19/2013  . Generalized anxiety disorder 01/19/2013  . OSA (obstructive sleep apnea) 11/19/2012  . Mediastinal lymphadenopathy 11/03/2012  . DOE (dyspnea on exertion) 11/03/2012  . DM (diabetes mellitus) ([place]) 11/07/2011  . Morbid obesity ([place]) 11/07/2011  . Menometrorrhagia   . Iron deficiency anemia due to chronic blood loss   . Essential hypertension     Past Surgical History:  Procedure Laterality Date  . TUBAL LIGATION       OB History   No obstetric history on file.      Home Medications    Prior to Admission medications   Medication Sig Start Date End Date Taking? Authorizing Provider  acetaminophen (TYLENOL) 500 MG tablet Take 500 mg by mouth every 6 (six) hours as needed  for mild pain.   Yes [provider]  albuterol (PROVENTIL HFA;VENTOLIN HFA) 108 (90 Base) MCG/ACT inhaler Inhale 1-2 puffs into the lungs every 6 (six) hours as needed for wheezing or shortness of breath. 09/29/17  Yes Magdalen Spatz, NP  albuterol (PROVENTIL) (2.5 MG/3ML) 0.083% nebulizer solution Take 3 mLs (2.5 mg total) by nebulization every 6 (six) hours as needed for wheezing or shortness of breath. 05/19/17  Yes Magdalen Spatz, NP  amLODipine (NORVASC) 10 MG tablet  Take 1 tablet (10 mg total) by mouth daily. 07/13/14  Yes Ollis, Cleaster Corin, NP  budesonide-formoterol (SYMBICORT) 160-4.5 MCG/ACT inhaler Inhale 2 puffs into the lungs 2 (two) times daily. 09/29/17  Yes Magdalen Spatz, NP  gabapentin (NEURONTIN) 400 MG capsule Take 400 mg by mouth every 6 (six) hours. 05/26/19  Yes [provider]  losartan-hydrochlorothiazide (HYZAAR) 100-12.5 MG tablet Take 1 tablet by mouth daily.  05/14/19  Yes [provider]  metFORMIN (GLUCOPHAGE) 1000 MG tablet Take 1,000 mg by mouth daily with breakfast.    Yes [provider]  TOUJEO SOLOSTAR 300 UNIT/ML SOPN Inject 40-50 Units into the skin 2 (two) times daily. Take 50 units in the morning and Take 40 units in the evening. 02/09/19  Yes [provider]  TRULICITY [id number] SOPN Inject 0.75 mg into the skin once a week.  06/14/19  Yes [provider]  Vitamin D, Ergocalciferol, (DRISDOL) 1.25 MG (50000 UT) CAPS capsule Take 50,000 Units by mouth every 7 (seven) days. Take on Sunday.   Yes [provider]  labetalol (NORMODYNE) 100 MG tablet Take 1 tablet (100 mg total) by mouth 2 (two) times daily. Patient not taking: Reported on 06/30/2019 07/13/14   Donita Brooks, NP  losartan-hydrochlorothiazide (HYZAAR) 100-25 MG per tablet Take 1 tablet by mouth daily. Patient not taking: Reported on 06/30/2019 07/13/14   Donita Brooks, NP  naproxen (NAPROSYN) 500 MG tablet Take 1 tablet (500 mg total) by mouth 2 (two) times daily as needed for mild pain, moderate pain or headache (TAKE WITH MEALS.). Patient not taking: Reported on 06/30/2019 07/13/14   Donita Brooks, NP  nystatin (MYCOSTATIN) 100000 UNIT/ML suspension Use 9ml three times a day for 2 weeks. Patient not taking: Reported on 06/30/2019 05/19/17   Magdalen Spatz, NP  oxyCODONE-acetaminophen (PERCOCET/ROXICET) 5-325 MG tablet Take 1 tablet by mouth every 8 (eight) hours as needed for severe pain. Patient not taking:  Reported on 06/30/2019 03/30/17   Delia Heady, PA-C    Family History Family History  Problem Relation Age of Onset  . Diabetes type II Father   . Asthma Father   . Hypertension Father   . Heart murmur Father   . Alcohol abuse Father   . Drug abuse Father   . Hypertension Mother   . Alcohol abuse Mother   . Drug abuse Mother     Social History Social History   Tobacco Use  . Smoking status: Former Smoker    Packs/day: 0.25    Years: 2.00    Pack years: 0.50    Types: Cigars    Quit date: 06/30/2017    Years since quitting: 2.0  . Smokeless tobacco: Never Used  . Tobacco comment: Smokes Marijuana (once daily)  Substance Use Topics  . Alcohol use: Yes    Alcohol/week: 0.0 standard drinks    Comment: all monthly  . Drug use: No    Frequency: 7.0 times per week  Types: Marijuana    Comment: Marijuana--once a week     Allergies   Patient has no known allergies.   Review of Systems Review of Systems  Constitutional: Negative for fever.  HENT: Negative for ear pain and sore throat.   Eyes: Positive for visual disturbance (resolved).  Respiratory: Positive for cough and shortness of breath (chronic, unchanged).   Cardiovascular: Negative for chest pain.  Gastrointestinal: Positive for abdominal pain (resolved), diarrhea and nausea. Negative for constipation and vomiting.  Genitourinary: Negative for dysuria and hematuria.  Musculoskeletal: Negative for back pain.  Skin: Negative for color change and rash.  Neurological: Positive for light-headedness and headaches. Negative for dizziness, weakness and numbness.  All other systems reviewed and are negative.    Physical Exam Updated Vital Signs BP 136/83   Pulse (!) 104   Temp 98.1 F (36.7 C) (Oral)   Resp (!) 22   SpO2 94%   Physical Exam Vitals signs and nursing note reviewed.  Constitutional:      General: She is not in acute distress.    Appearance: She is well-developed. She is not ill-appearing or  toxic-appearing.  HENT:     Head: Normocephalic and atraumatic.     Mouth/Throat:     Mouth: Mucous membranes are dry.  Eyes:     Conjunctiva/sclera: Conjunctivae normal.  Neck:     Musculoskeletal: Neck supple.  Cardiovascular:     Rate and Rhythm: Regular rhythm. Tachycardia present.     Heart sounds: No murmur.  Pulmonary:     Effort: Pulmonary effort is normal. No respiratory distress.     Breath sounds: Normal breath sounds. No wheezing, rhonchi or rales.  Abdominal:     General: Abdomen is protuberant. Bowel sounds are normal.     Palpations: Abdomen is soft.     Tenderness: There is no abdominal tenderness.  Skin:    General: Skin is warm and dry.  Neurological:     Mental Status: She is alert.     Comments: Mental Status:  Alert, thought content appropriate, able to give a coherent history. Speech fluent without evidence of aphasia. Able to follow 2 step commands without difficulty.  Cranial Nerves:  II:  pupils equal, round, reactive to light III,IV, VI: ptosis not present, extra-ocular motions intact bilaterally  V,VII: smile symmetric, facial light touch sensation equal VIII: hearing grossly normal to voice  X: uvula elevates symmetrically  XI: bilateral shoulder shrug symmetric and strong XII: midline tongue extension without fassiculations Motor:  Normal tone. 5/5 strength of BUE and BLE major muscle groups including strong and equal grip strength and dorsiflexion/plantar flexion Sensory: light touch normal in all extremities.      ED Treatments / Results  Labs (all labs ordered are listed, but only abnormal results are displayed) Labs Reviewed  CBC - Abnormal; Notable for the following components:      Result Value   RBC 5.15 (*)    Hemoglobin 10.6 (*)    MCV 73.2 (*)    MCH 20.6 (*)    MCHC 28.1 (*)    RDW 21.2 (*)    All other components within normal limits  URINALYSIS, ROUTINE W REFLEX MICROSCOPIC  COMPREHENSIVE METABOLIC PANEL  LIPASE, BLOOD   I-STAT BETA HCG BLOOD, ED ([facility], [facility], [facility] ONLY)    EKG None  Radiology Dg Chest 2 View  Result Date: 06/30/2019 CLINICAL DATA:  Cough and shortness of breath. History of sarcoidosis. EXAM: CHEST - 2 VIEW COMPARISON:  Radiograph 07/12/2014.  Chest CT 04/29/2017 FINDINGS: Unchanged heart size and mediastinal contours. Bilateral hilar prominence likely secondary to adenopathy is seen on chest CT and related to sarcoidosis. Chronic right greater than left suprahilar opacities are unchanged. No acute airspace disease, pleural effusion or pneumothorax. No acute osseous abnormalities. IMPRESSION: Stable chronic lung disease related to sarcoidosis. No superimposed acute abnormality. Electronically Signed   By: Keith Rake M.D.   On: 06/30/2019 21:00   Ct Head Wo Contrast  Result Date: 06/30/2019 CLINICAL DATA:  Headache EXAM: CT HEAD WITHOUT CONTRAST TECHNIQUE: Contiguous axial images were obtained from the base of the skull through the vertex without intravenous contrast. 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 No acute intracranial abnormality. Specifically, no hemorrhage, hydrocephalus, mass lesion, acute infarction, or significant intracranial injury. Vascular: No hyperdense vessel or unexpected calcification. Skull: No acute calvarial abnormality. Sinuses/Orbits: Visualized paranasal sinuses and mastoids clear. Orbital soft tissues unremarkable. Other: None IMPRESSION: No acute intracranial abnormality. Electronically Signed   By: Rolm Baptise M.D.   On: 06/30/2019 20:53    Procedures Procedures (including critical care time)  Medications Ordered in ED Medications  sodium chloride flush (NS) 0.9 % injection 3 mL (3 mLs Intravenous Given 06/30/19 2026)     Initial Impression / Assessment and Plan / ED Course  I have reviewed the triage vital signs and the nursing notes.  Pertinent labs & imaging results that were available during my care of the patient were  reviewed by me and considered in my medical decision making (see chart for details).    Final Clinical Impressions(s) / ED Diagnoses   Final diagnoses:  Acute nonintractable headache, unspecified headache type  Abdominal pain, unspecified abdominal location  Diarrhea, unspecified type   53 year old female presenting for evaluation after she was told she might have an aortic aneurysm after having her routine chest x-ray as an outpatient with her pain management clinic.  She is also complaining of a headache and abdominal pain both of which have currently resolved.   She does have some mild tachycardia which appears chronic on chart review. Otherwise her vs are reassuring. Neuro exam is normal. abd exam is benign.   Will obtain labs, cxr, ct head.   CBC w/o leukocytosis, mild anemia present. remainder of labs pending.  EKG with sinus tachycardia.  CT head negative.  CXR pending.  Pt is not having any chest pain or abd pain currently that would suggest dissection. Reviewed prior ct chest and abdomen from 2018 and there was no abnormality of her aorta at that time. She did have some mediastinal lymphadenopathy on that study. I am unable to review the cxr from her clinic visit.   At shift change, pending remainder of labs, and cxr. If w/u is negative, feel patient is safe for d/c with outpt f/u with her pcp. Care transitioned to [facility], PA-C with above plan.   ED Discharge Orders    None       Bishop Dublin 06/30/19 2114    Milton Ferguson, MD 06/30/19 2304

## 2019-06-30 NOTE — ED Notes (Signed)
Difficult stick Stuck once

## 2019-06-30 NOTE — Discharge Instructions (Signed)

## 2019-08-11 ENCOUNTER — Other Ambulatory Visit: Payer: Self-pay

## 2019-08-11 ENCOUNTER — Ambulatory Visit (HOSPITAL_COMMUNITY)
Admission: EM | Admit: 2019-08-11 | Discharge: 2019-08-11 | Disposition: A | Discharge status: Home / Self Care | Payer: Medicare HMO | Attending: Family Medicine | Admitting: Family Medicine

## 2019-08-11 ENCOUNTER — Encounter (HOSPITAL_COMMUNITY): Payer: Self-pay

## 2019-08-11 DIAGNOSIS — I10 Essential (primary) hypertension: Secondary | ICD-10-CM

## 2019-08-11 DIAGNOSIS — Z20822 Contact with and (suspected) exposure to covid-19: Secondary | ICD-10-CM | POA: Insufficient documentation

## 2019-08-11 NOTE — ED Provider Notes (Signed)
[place]    CSN: [id number] Arrival date & time: 08/11/19  [id number]      History   Chief Complaint Chief Complaint  Patient presents with  . COVID Exposure    HPI Cassandra Burns is a 54 y.o. female.   54 year old female presents today for Covid exposure.  Reporting approximately 4 days ago was exposed to someone that was Covid positive.  Denies any current symptoms.     Past Medical History:  Diagnosis Date  . Anxiety   . Asthma   . Bone pain 06/07/2013  . Depression   . 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 Hernia of abdominal cavity 10/25/2013  . Hypertension   . Iron deficiency anemia due to chronic blood loss   . Lymphadenopathy 06/07/2013  . Menometrorrhagia   . Obesity 12/18/2012  . Psoriasis   . Sarcoidosis 07/26/2013  . Sleep apnea   . Unspecified deficiency anemia 07/26/2013    Patient Active Problem List   Diagnosis Date Noted  . Tobacco abuse 06/30/2017  . Asthma 05/19/2017  . Cough   . Hypertensive emergency 07/11/2014  . HTN (hypertension)   . Pulmonary edema   . SOB (shortness of breath)   . Hypertensive urgency 07/10/2014  . Rectal bleeding 04/14/2014  . Diastasis recti 11/04/2013  . Hernia of abdominal cavity 10/25/2013  . Sarcoidosis 07/26/2013  . Unspecified deficiency anemia 07/26/2013  . Bone pain 06/07/2013  . Lymphadenopathy 06/07/2013  . Obesity hypoventilation syndrome 04/06/2013  . Depression, major, recurrent, moderate ([place]) 01/19/2013  . Generalized anxiety disorder 01/19/2013  . OSA (obstructive sleep apnea) 11/19/2012  . Mediastinal lymphadenopathy 11/03/2012  . DOE (dyspnea on exertion) 11/03/2012  . DM (diabetes mellitus) ([place]) 11/07/2011  . Morbid obesity ([place]) 11/07/2011  . Menometrorrhagia   . Iron deficiency anemia due to chronic blood loss   . Essential hypertension     Past Surgical History:  Procedure Laterality Date  . TUBAL LIGATION      OB History   No obstetric history on file.      Home Medications    Prior to Admission medications   Medication Sig Start Date End Date Taking? Authorizing Provider  acetaminophen (TYLENOL) 500 MG tablet Take 500 mg by mouth every 6 (six) hours as needed for mild pain.    [provider]  albuterol (PROVENTIL HFA;VENTOLIN HFA) 108 (90 Base) MCG/ACT inhaler Inhale 1-2 puffs into the lungs every 6 (six) hours as needed for wheezing or shortness of breath. 09/29/17   Magdalen Spatz, NP  albuterol (PROVENTIL) (2.5 MG/3ML) 0.083% nebulizer solution Take 3 mLs (2.5 mg total) by nebulization every 6 (six) hours as needed for wheezing or shortness of breath. 05/19/17   Magdalen Spatz, NP  amLODipine (NORVASC) 10 MG tablet Take 1 tablet (10 mg total) by mouth daily. 07/13/14   Donita Brooks, NP  budesonide-formoterol (SYMBICORT) 160-4.5 MCG/ACT inhaler Inhale 2 puffs into the lungs 2 (two) times daily. 09/29/17   Magdalen Spatz, NP  gabapentin (NEURONTIN) 400 MG capsule Take 400 mg by mouth every 6 (six) hours. 05/26/19   [provider]  losartan-hydrochlorothiazide (HYZAAR) 100-12.5 MG tablet Take 1 tablet by mouth daily.  05/14/19   [provider]  metFORMIN (GLUCOPHAGE) 1000 MG tablet Take 1,000 mg by mouth daily with breakfast.     [provider]  TOUJEO SOLOSTAR 300 UNIT/ML SOPN Inject 40-50 Units into the skin 2 (two) times daily. Take 50 units in the morning  and Take 40 units in the evening. 02/09/19   [provider]  TRULICITY A999333 0000000 SOPN Inject 0.75 mg into the skin once a week.  06/14/19   [provider]  Vitamin D, Ergocalciferol, (DRISDOL) 1.25 MG (50000 UT) CAPS capsule Take 50,000 Units by mouth every 7 (seven) days. Take on Sunday.    [provider]  labetalol (NORMODYNE) 100 MG tablet Take 1 tablet (100 mg total) by mouth 2 (two) times daily. Patient not taking: Reported on 06/30/2019 07/13/14 08/11/19  Donita Brooks, NP    Family History Family History   Problem Relation Age of Onset  . Diabetes type II Father   . Asthma Father   . Hypertension Father   . Heart murmur Father   . Alcohol abuse Father   . Drug abuse Father   . Hypertension Mother   . Alcohol abuse Mother   . Drug abuse Mother     Social History Social History   Tobacco Use  . Smoking status: Former Smoker    Packs/day: 0.25    Years: 2.00    Pack years: 0.50    Types: Cigars    Quit date: 06/30/2017    Years since quitting: 2.1  . Smokeless tobacco: Never Used  . Tobacco comment: Smokes Marijuana (once daily)  Substance Use Topics  . Alcohol use: Yes    Alcohol/week: 0.0 standard drinks    Comment: all monthly  . Drug use: No    Frequency: 7.0 times per week    Types: Marijuana    Comment: Marijuana--once a week     Allergies   Patient has no known allergies.   Review of Systems Review of Systems  Constitutional: Negative for chills and fever.  HENT: Negative for ear pain and sore throat.   Eyes: Negative for pain and visual disturbance.  Respiratory: Negative for cough and shortness of breath.   Cardiovascular: Negative for chest pain and palpitations.  Gastrointestinal: Negative for abdominal pain and vomiting.  Genitourinary: Negative for dysuria and hematuria.  Musculoskeletal: Negative for arthralgias and back pain.  Skin: Negative for color change and rash.  Neurological: Negative for seizures and syncope.  All other systems reviewed and are negative.    Physical Exam Triage Vital Signs ED Triage Vitals  Enc Vitals Group     BP 08/11/19 0844 (!) 176/100     Pulse Rate 08/11/19 0844 93     Resp 08/11/19 0844 17     Temp 08/11/19 0844 99.6 F (37.6 C)     Temp Source 08/11/19 0844 Oral     SpO2 08/11/19 0844 96 %     Weight --      Height --      Head Circumference --      Peak Flow --      Pain Score 08/11/19 0841 0     Pain Loc --      Pain Edu? --      Excl. in [place]? --    No data found.  Updated Vital Signs BP (!)  176/100 (BP Location: Left Arm)   Pulse 93   Temp 99.6 F (37.6 C) (Oral)   Resp 17   LMP 06/13/2012   SpO2 96%   Visual Acuity Right Eye Distance:   Left Eye Distance:   Bilateral Distance:    Right Eye Near:   Left Eye Near:    Bilateral Near:     Physical Exam Vitals and nursing note reviewed.  Constitutional:      General: She is not in acute distress.    Appearance: Normal appearance. She is not ill-appearing, toxic-appearing or diaphoretic.  HENT:     Head: Normocephalic.     Nose: Nose normal.  Eyes:     Conjunctiva/sclera: Conjunctivae normal.  Pulmonary:     Effort: Pulmonary effort is normal.  Musculoskeletal:        General: Normal range of motion.     Cervical back: Normal range of motion.  Skin:    General: Skin is warm and dry.     Findings: No rash.  Neurological:     Mental Status: She is alert.  Psychiatric:        Mood and Affect: Mood normal.      UC Treatments / Results  Labs (all labs ordered are listed, but only abnormal results are displayed) Labs Reviewed  NOVEL CORONAVIRUS, NAA (HOSP ORDER, SEND-OUT TO REF LAB; TAT 18-24 HRS)    EKG   Radiology No results found.  Procedures Procedures (including critical care time)  Medications Ordered in UC Medications - No data to display  Initial Impression / Assessment and Plan / UC Course  I have reviewed the triage vital signs and the nursing notes.  Pertinent labs & imaging results that were available during my care of the patient were reviewed by me and considered in my medical decision making (see chart for details).     Exposure to Covid-Covid swab sent for testing with labs pending. Quarantine precautions given Final Clinical Impressions(s) / UC Diagnoses   Final diagnoses:  Exposure to COVID-19 virus     Discharge Instructions     We have tested you for COVID  Go home and quarantine until we get our results.  Work note given You can take OTC medications as needed.        ED Prescriptions    None     PDMP not reviewed this encounter.   Orvan July, NP 08/11/19 [phone]

## 2019-08-11 NOTE — ED Triage Notes (Signed)
Patient presents to Urgent Care with complaints of needing a covid test since she was exposed to someone 4 days ago. Patient reports she has no sx of illness.

## 2019-08-11 NOTE — Discharge Instructions (Signed)
We have tested you for COVID  °Go home and quarantine until we get our results.  °Work note given °You can take OTC medications as needed.  ° °

## 2019-08-13 LAB — NOVEL CORONAVIRUS, NAA (HOSP ORDER, SEND-OUT TO REF LAB; TAT 18-24 HRS): SARS-CoV-2, NAA: NOT DETECTED

## 2019-10-14 ENCOUNTER — Other Ambulatory Visit: Payer: Self-pay | Admitting: Internal Medicine

## 2019-10-14 DIAGNOSIS — N644 Mastodynia: Secondary | ICD-10-CM

## 2019-10-25 ENCOUNTER — Other Ambulatory Visit: Payer: Self-pay

## 2019-10-25 ENCOUNTER — Encounter: Payer: Self-pay | Admitting: Acute Care

## 2019-10-25 ENCOUNTER — Ambulatory Visit: Payer: Medicare HMO | Admitting: Acute Care

## 2019-10-25 VITALS — BP 180/100 | HR 101 | Temp 97.6°F | Ht 59.0 in | Wt 207.4 lb

## 2019-10-25 DIAGNOSIS — I10 Essential (primary) hypertension: Secondary | ICD-10-CM

## 2019-10-25 DIAGNOSIS — Z0000 Encounter for general adult medical examination without abnormal findings: Secondary | ICD-10-CM

## 2019-10-25 DIAGNOSIS — D869 Sarcoidosis, unspecified: Secondary | ICD-10-CM | POA: Diagnosis not present

## 2019-10-25 DIAGNOSIS — G4733 Obstructive sleep apnea (adult) (pediatric): Secondary | ICD-10-CM

## 2019-10-25 MED ORDER — PREDNISONE 10 MG PO TABS: ORAL_TABLET | ORAL | 0 refills | Status: DC

## 2019-10-25 NOTE — Progress Notes (Signed)
History of Present Illness Cassandra Burns is a 54 y.o. female former smoker ( Quit 2018) with pulmonary sarcoidosis, asthma, and obstructive sleep apnea. She is followed by Dr. Halford Chessman. She has been off prednisone since 12/2018.    10/25/2019 Follow up Sarcoid  Pt. Presents for follow up. She has been 100% compliant with her BiPAP. She wears her machine every night. Her down load confirms 100% compliance over the last 30 days. AHI is well controlled. She states she has adequate supplies.  I am more concerned with her continued dyspnea with exertion. She has not been using her inhalers as prescribed. She has been using her Symbicort only as needed. , not as a maintenance medication. Additionally she has not been using her rescue inhaler. We did significant teaching today to clarify that Symbicort is maintenance , to be used every day without fail regardless of how she feels. She has been off steroids since 12/2018. She was motivated to come off the steroids as she had significant weight gain on the steroids.  Additionally her BP is high today. 180/100. She has not taken her BP medications this morning. We discussed the importance of taking these medications as prescribed. She denies any fever, chest pain, orthopnea or hemoptysis.   Test Results: 10/25/2019 Down Load BiPAP Worn 30/30 days IPAP 16 EPAP 12 Average use 8 hours 33 minutes AHI is 0.2   Pulmonary tests:  EBUS May 2015 >> granulomas PFT February 2015 >> DLCO 60% HRCT chest 04/29/17 >> atherosclerosis, patchy nodular thickening of interstitium, mild traction BTX, patchy air trapping, splenomegaly  Sleep tests:  BiPAP 07/05/18 to 08/03/18 >> used on 23 out of 30 days with average 8 hrs 51 min.  Average AHI 0.2 with Bipap 16/12 cm H2O.   CBC Latest Ref Rng & Units 06/30/2019 11/07/2017 08/08/2017  WBC 4.0 - 10.5 K/uL 7.3 6.7 6.7  Hemoglobin 12.0 - 15.0 g/dL 10.6(L) 11.8 11.7  Hematocrit 36.0 - 46.0 % 37.7 38.6 38.3  Platelets  150 - 400 K/uL 394 213 189    BMP Latest Ref Rng & Units 06/30/2019 04/21/2017 03/30/2017  Glucose 70 - 99 mg/dL 128(H) 175(H) 183(H)  BUN 6 - 20 mg/dL 21(H) 13 13  Creatinine 0.44 - 1.00 mg/dL 0.98 0.82 0.74  Sodium 135 - 145 mmol/L 133(L) 138 135  Potassium 3.5 - 5.1 mmol/L 4.9 4.1 3.9  Chloride 98 - 111 mmol/L 102 103 100(L)  CO2 22 - 32 mmol/L 21(L) 26 25  Calcium 8.9 - 10.3 mg/dL 10.9(H) 10.5 10.7(H)    BNP No results found for: BNP  ProBNP    Component Value Date/Time   PROBNP 175.8 (H) 07/10/2014 1320    PFT    Component Value Date/Time   FEV1PRE 1.19 05/19/2017 1035   FEV1POST 1.17 05/19/2017 1035   FVCPRE 1.33 05/19/2017 1035   FVCPOST 1.26 05/19/2017 1035   TLC 2.52 05/19/2017 1035   DLCOUNC 11.09 05/19/2017 1035   PREFEV1FVCRT 90 05/19/2017 1035   PSTFEV1FVCRT 93 05/19/2017 1035    No results found.   Past medical hx Past Medical History:  Diagnosis Date  . Anxiety   . Asthma   . Bone pain 06/07/2013  . Depression   . 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 Hernia of abdominal cavity 10/25/2013  . Hypertension   . Iron deficiency anemia due to chronic blood loss   . Lymphadenopathy 06/07/2013  . Menometrorrhagia   . Obesity 12/18/2012  . Psoriasis   . Sarcoidosis  07/26/2013  . Sleep apnea   . Unspecified deficiency anemia 07/26/2013     Social History   Tobacco Use  . Smoking status: Former Smoker    Packs/day: 0.25    Years: 2.00    Pack years: 0.50    Types: Cigars    Quit date: 06/30/2017    Years since quitting: 2.3  . Smokeless tobacco: Never Used  . Tobacco comment: Smokes Marijuana (once daily)  Substance Use Topics  . Alcohol use: Yes    Alcohol/week: 0.0 standard drinks    Comment: all monthly  . Drug use: No    Frequency: 7.0 times per week    Types: Marijuana    Comment: Marijuana--once a week    Ms.Biswell reports that she quit smoking about 2 years ago. Her smoking use included cigars. She has a 0.50 pack-year  smoking history. She has never used smokeless tobacco. She reports current alcohol use. She reports that she does not use drugs.  Tobacco Cessation: Former smoker Quit 2018 She currently smokes Marijuana once daily  Past surgical hx, Family hx, Social hx all reviewed.  Current Outpatient Medications on File Prior to Visit  Medication Sig  . acetaminophen (TYLENOL) 500 MG tablet Take 500 mg by mouth every 6 (six) hours as needed for mild pain.  [name] albuterol (PROVENTIL HFA;VENTOLIN HFA) 108 (90 Base) MCG/ACT inhaler Inhale 1-2 puffs into the lungs every 6 (six) hours as needed for wheezing or shortness of breath.  [name] albuterol (PROVENTIL) (2.5 MG/3ML) 0.083% nebulizer solution Take 3 mLs (2.5 mg total) by nebulization every 6 (six) hours as needed for wheezing or shortness of breath.  [name] amLODipine (NORVASC) 10 MG tablet Take 1 tablet (10 mg total) by mouth daily.  . budesonide-formoterol (SYMBICORT) 160-4.5 MCG/ACT inhaler Inhale 2 puffs into the lungs 2 (two) times daily.  [name] gabapentin (NEURONTIN) 400 MG capsule Take 400 mg by mouth every 6 (six) hours.  [name] losartan-hydrochlorothiazide (HYZAAR) 100-12.5 MG tablet Take 1 tablet by mouth daily.   . metFORMIN (GLUCOPHAGE) 1000 MG tablet Take 1,000 mg by mouth daily with breakfast.   . TOUJEO SOLOSTAR 300 UNIT/ML SOPN Inject 40-50 Units into the skin 2 (two) times daily. Take 50 units in the morning and Take 40 units in the evening.  . TRULICITY A999333 0000000 SOPN Inject 0.75 mg into the skin once a week.   . Vitamin D, Ergocalciferol, (DRISDOL) 1.25 MG (50000 UT) CAPS capsule Take 50,000 Units by mouth every 7 (seven) days. Take on Sunday.  . [DISCONTINUED] labetalol (NORMODYNE) 100 MG tablet Take 1 tablet (100 mg total) by mouth 2 (two) times daily. (Patient not taking: Reported on 06/30/2019)   No current facility-administered medications on file prior to visit.     No Known Allergies  Review Of Systems:  Constitutional:   No  weight loss,  night sweats,  Fevers, chills, fatigue, or  lassitude.  HEENT:   No headaches,  Difficulty swallowing,  Tooth/dental problems, or  Sore throat,                No sneezing, itching, ear ache, nasal congestion, post nasal drip,   CV:  No chest pain,  Orthopnea, PND, swelling in lower extremities, anasarca, dizziness, palpitations, syncope.   GI  No heartburn, indigestion, abdominal pain, nausea, vomiting, diarrhea, change in bowel habits, loss of appetite, bloody stools.   Resp: + shortness of breath with exertion less at rest.  No excess mucus, no productive cough,  No non-productive cough,  No coughing up of blood.  No change in color of mucus.  No wheezing.  No chest wall deformity  Skin: no rash or lesions.  GU: no dysuria, change in color of urine, no urgency or frequency.  No flank pain, no hematuria   MS:  No joint pain or swelling.  No decreased range of motion.  No back pain.  Psych:  No change in mood or affect. No depression or anxiety.  No memory loss.   Vital Signs BP (!) 180/100 (BP Location: Left Arm, Cuff Size: Large)   Pulse (!) 101   Temp 97.6 F (36.4 C) (Temporal)   Ht 4\' 11"  (1.499 m)   Wt 207 lb 6.4 oz (94.1 kg)   LMP 06/13/2012   SpO2 93%   BMI 41.89 kg/m    Physical Exam:  General- No distress,  A&Ox3, pleasant ENT: No sinus tenderness, TM clear, pale nasal mucosa, no oral exudate,no post nasal drip, no LAN Cardiac: S1, S2, regular rate and rhythm, no murmur Chest: No wheeze/ rales/ dullness; no accessory muscle use, no nasal flaring, no sternal retractions Abd.: Soft Non-tender, ND, BS+, Body mass index is 41.89 kg/m. Ext: No clubbing cyanosis, edema Neuro:  normal strength, MAE x 4, A&O x 3, appropriate Skin: No rashes, No lesions, warm and dry Psych: normal mood and behavior   Assessment/Plan  Systemic Sarcoidosis Off Prednisone since 6/202o Has lost weight off prednisone Some Dyspnea?? 2/2 misuse of maintenance asthma meds Plan Pred  taper>> will evaluate for any improvement Will check PFT's and DLCO for progression of disease Follow Clinically Encouraged to get Covid Vaccine  Asthma Dyspnea , No wheezing Not using maintenance medication or rescue as prescribed  Plan Please use your Symbicort every day without fail. 2 puffs in the morning and 2 puffs in the evening. Rinse mouth after use  Use your Proventil rescue inhaler as needed for breakthrough shortness of breath or wheezing up to 3 times daily. We will send in a Prednisone taper; 10 mg tablets: 4 tabs x 2 days, 3 tabs x 2 days, 2 tabs x 2 days 1 tab x 2 days then stop Watch your blood sugars while on prednisone taper. We will see you again in 2 weeks with a walk after you have been using your inhalers properly We will schedule you for PFT's. You will need a Covid test prior.   OSA Compliant on BiPAP 14/10 Great Control of AHI with decreased settings Settings decreased with her recent weight loss by Dr. Halford Chessman.  Wears for 8+ hours per night Plan Continue on BiPAP at bedtime. You appear to be benefiting from the treatment  Goal is to wear for at least 6 hours each night for maximal clinical benefit. Continue to work on weight loss, as the link between excess weight  and sleep apnea is well established.   Remember to establish a good bedtime routine, and work on sleep hygiene.  Limit daytime naps , avoid stimulants such as caffeine and nicotine close to bedtime, exercise daily to promote sleep quality, avoid heavy , spicy, fried , or rich foods before bed. Ensure adequate exposure to natural light during the day,establish a relaxing bedtime routine with a pleasant sleep environment ( Bedroom between 60 and 67 degrees, turn off bright lights , TV or device screens screens , consider black out curtains or white noise machines) Do not drive if sleepy. Remember to clean mask, tubing, filter, and reservoir once weekly with soapy water.  Follow up  with Curtistine Pettitt NP   In 2  weeks  or before as needed.   Obesity  Weight better since coming off prednisone Body mass index is 41.89 kg/m. Plan We will refer to dietitian for weight loss and sodium management  Health Maintenance Has had Flu vaccine ? Pneumococcal vaccine>> to check with PCP Needs to get Covid Vaccine Plan I have encouraged patient to schedule her Covid Vaccine. She qualifies for Group 4 due to her asthma and Sarcoid.   This appointment was 30 min long with over 50% of the time in direct face-to-face patient care, assessment, plan of care, and follow-up.    Magdalen Spatz, NP 10/25/2019  11:14 AM

## 2019-10-25 NOTE — Patient Instructions (Addendum)
It is good to see you today Please use your Symbicort every day without fail. 2 puffs in the morning and 2 puffs in the evening. Rinse mouth after use  Use your Proventil rescue inhaler as needed for breakthrough shortness of breath or wheezing up to 3 times daily. We will refer you to dietician for salt intake and calorie counting. We will send in a Prednisone taper; 10 mg tablets: 4 tabs x 2 days, 3 tabs x 2 days, 2 tabs x 2 days 1 tab x 2 days then stop Watch your blood sugars while on prednisone taper. We will see you again in 2 weeks with a walk after you have been using your inhalers properly. Please take your Norvasc daily without fail to control you blood pressure. Please limit salt intake to 2000 mg daily.  We will schedule you for PFT's. You will need a Covid test prior.  Please contact office for sooner follow up if symptoms do not improve or worsen or seek emergency care  Call us if you need [place] sooner.  Please remember to get your Covid Vaccine. Please contact office for sooner follow up if symptoms do not improve or worsen or seek emergency care

## 2019-10-29 ENCOUNTER — Other Ambulatory Visit: Payer: Self-pay

## 2019-10-29 ENCOUNTER — Ambulatory Visit: Payer: Medicare HMO

## 2019-10-29 ENCOUNTER — Ambulatory Visit
Admission: RE | Admit: 2019-10-29 | Discharge: 2019-10-29 | Disposition: A | Discharge status: Home / Self Care | Payer: Medicare HMO | Source: Ambulatory Visit | Attending: Internal Medicine | Admitting: Internal Medicine

## 2019-10-29 DIAGNOSIS — N644 Mastodynia: Secondary | ICD-10-CM

## 2019-11-01 NOTE — Progress Notes (Signed)
Reviewed and agree with assessment/plan.   Carsten Carstarphen, MD [facility] 07/24/2016, 12:24 PM Pager:  [phone]  

## 2019-11-04 ENCOUNTER — Other Ambulatory Visit (HOSPITAL_COMMUNITY)
Admission: RE | Admit: 2019-11-04 | Discharge: 2019-11-04 | Disposition: A | Discharge status: Home / Self Care | Payer: Medicare HMO | Source: Ambulatory Visit | Attending: Acute Care | Admitting: Acute Care

## 2019-11-04 DIAGNOSIS — Z20822 Contact with and (suspected) exposure to covid-19: Secondary | ICD-10-CM | POA: Insufficient documentation

## 2019-11-04 DIAGNOSIS — Z01812 Encounter for preprocedural laboratory examination: Secondary | ICD-10-CM | POA: Diagnosis present

## 2019-11-04 LAB — SARS CORONAVIRUS 2 (TAT 6-24 HRS): SARS Coronavirus 2: NEGATIVE

## 2019-11-08 ENCOUNTER — Other Ambulatory Visit: Payer: Self-pay

## 2019-11-08 ENCOUNTER — Ambulatory Visit (INDEPENDENT_AMBULATORY_CARE_PROVIDER_SITE_OTHER): Payer: Medicare HMO | Admitting: Pulmonary Disease

## 2019-11-08 DIAGNOSIS — D869 Sarcoidosis, unspecified: Secondary | ICD-10-CM

## 2019-11-08 LAB — PULMONARY FUNCTION TEST
DL/VA % pred: 126 %
DL/VA: 5.58 ml/min/mmHg/L
DLCO unc % pred: 55 %
DLCO unc: 9.95 ml/min/mmHg
FEF 25-75 Post: 0.74 L/sec
FEF 25-75 Pre: 1.19 L/sec
FEF2575-%Change-Post: -38 %
FEF2575-%Pred-Post: 36 %
FEF2575-%Pred-Pre: 58 %
FEV1-%Change-Post: -9 %
FEV1-%Pred-Post: 42 %
FEV1-%Pred-Pre: 46 %
FEV1-Post: 0.8 L
FEV1-Pre: 0.88 L
FEV1FVC-%Change-Post: 3 %
FEV1FVC-%Pred-Pre: 109 %
FEV6-%Change-Post: -11 %
FEV6-%Pred-Post: 38 %
FEV6-%Pred-Pre: 43 %
FEV6-Post: 0.87 L
FEV6-Pre: 0.99 L
FEV6FVC-%Pred-Post: 103 %
FEV6FVC-%Pred-Pre: 103 %
FVC-%Change-Post: -12 %
FVC-%Pred-Post: 36 %
FVC-%Pred-Pre: 42 %
FVC-Post: 0.87 L
FVC-Pre: 1 L
Post FEV1/FVC ratio: 91 %
Post FEV6/FVC ratio: 100 %
Pre FEV1/FVC ratio: 89 %
Pre FEV6/FVC Ratio: 100 %
RV % pred: 79 %
RV: 1.33 L
TLC % pred: 56 %
TLC: 2.5 L

## 2019-11-08 NOTE — Progress Notes (Signed)
PFT completed today.  

## 2019-11-09 ENCOUNTER — Ambulatory Visit (INDEPENDENT_AMBULATORY_CARE_PROVIDER_SITE_OTHER): Payer: Medicare HMO | Admitting: Primary Care

## 2019-11-09 ENCOUNTER — Encounter: Payer: Self-pay | Admitting: Primary Care

## 2019-11-09 ENCOUNTER — Ambulatory Visit: Payer: Medicare HMO

## 2019-11-09 DIAGNOSIS — J45909 Unspecified asthma, uncomplicated: Secondary | ICD-10-CM

## 2019-11-09 DIAGNOSIS — D869 Sarcoidosis, unspecified: Secondary | ICD-10-CM | POA: Diagnosis not present

## 2019-11-09 DIAGNOSIS — G4733 Obstructive sleep apnea (adult) (pediatric): Secondary | ICD-10-CM | POA: Diagnosis not present

## 2019-11-09 MED ORDER — ALBUTEROL SULFATE HFA 108 (90 BASE) MCG/ACT IN AERS: 1.0000 | INHALATION_SPRAY | Freq: Four times a day (QID) | RESPIRATORY_TRACT | 6 refills | Status: AC | PRN

## 2019-11-09 NOTE — Progress Notes (Signed)
@Patient  ID: Cassandra Burns, female    DOB: 1966/07/23, 54 y.o.   MRN: [id number]  Chief Complaint  Patient presents with  . Follow-up    doing better     Referring provider: [name], MD  HPI: 54 year old female, former smoker.  Past medical history significant for sarcoidosis, morbid obesity, diabetes, OSA, obesity hypoventilation syndrome.  Patient of Dr. Halford Chessman, most recently seen by pulmonary NP on 10/25/19.  11/09/2019 Patient presents today for two week follow-up.  She had pulmonary function testing unfortunately missed her 6-minute walk test today.  She feels her breathing has significantly improved since her last visit.  Pleated prednisone taper prescribed.  She was previously using her Symbicort inhaler only as needed.  She is now using it regularly and can see a big difference in her breathing.  She still experiences some mild shortness of breath with activity depending on what she is doing. States that she will get out of breath walking up a hill or with long distances.  She has been consistently wearing her BiPAP every night.  She reports working on weight loss and losing around 30 pounds.  Her PFTs today do show a decrease in her FEV1 and DLCO compared to 2018.   Airview download 10/09/19- 11/07/19: 30/30 dayus (100%); 30 days (100%) Average use 8 hours 24 mins Pressure 16/12 AHI 0.1  Imaging: HRCT 04/29/17: Spectrum of findings compatible with pulmonary sarcoidosis, including patchy finely nodular peribronchovascular interstitial thickening with associated mild architectural distortion and mild traction bronchiectasis, most prominent in the right upper lobe. Interval progression of these findings since 06/18/2013 chest CT.  PFTs 05/19/17 FEV 1.17 (60%), DLCOcor 10.99L (58%), DL/VA 113%  11/08/19 FEV1 0.8 (42%), DLCOunc 9.95 (55%), DL/VA 126%  No Known Allergies  Immunization History  Administered Date(s) Administered  . Influenza Split 08/29/2012,  07/20/2014  . Influenza,inj,Quad PF,6+ Mos 08/05/2018  . Influenza-Unspecified 05/29/2017    Past Medical History:  Diagnosis Date  . Anxiety   . Asthma   . Bone pain 06/07/2013  . Depression   . 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 Hernia of abdominal cavity 10/25/2013  . Hypertension   . Iron deficiency anemia due to chronic blood loss   . Lymphadenopathy 06/07/2013  . Menometrorrhagia   . Obesity 12/18/2012  . Psoriasis   . Sarcoidosis 07/26/2013  . Sleep apnea   . Unspecified deficiency anemia 07/26/2013    Tobacco History: Social History   Tobacco Use  Smoking Status Former Smoker  . Packs/day: 0.25  . Years: 2.00  . Pack years: 0.50  . Types: Cigars  . Quit date: 06/30/2017  . Years since quitting: 2.3  Smokeless Tobacco Never Used  Tobacco Comment   Smokes Marijuana (once daily)   Counseling given: Not Answered Comment: Smokes Marijuana (once daily)   Outpatient Medications Prior to Visit  Medication Sig Dispense Refill  . acetaminophen (TYLENOL) 500 MG tablet Take 500 mg by mouth every 6 (six) hours as needed for mild pain.    [name] albuterol (PROVENTIL) (2.5 MG/3ML) 0.083% nebulizer solution Take 3 mLs (2.5 mg total) by nebulization every 6 (six) hours as needed for wheezing or shortness of breath. 75 mL 12  . amLODipine (NORVASC) 10 MG tablet Take 1 tablet (10 mg total) by mouth daily. 30 tablet 1  . budesonide-formoterol (SYMBICORT) 160-4.5 MCG/ACT inhaler Inhale 2 puffs into the lungs 2 (two) times daily. 1 Inhaler 6  . gabapentin (NEURONTIN) 400 MG capsule Take 400 mg by mouth  every 6 (six) hours.    [name] losartan-hydrochlorothiazide (HYZAAR) 100-12.5 MG tablet Take 1 tablet by mouth daily.     . metFORMIN (GLUCOPHAGE) 1000 MG tablet Take 1,000 mg by mouth daily with breakfast.     . predniSONE (DELTASONE) 10 MG tablet Take 4 tabs x 2 days, then 3 x 2d, then 2 x 2d, then 1 x 2d and STOP 20 tablet 0  . TOUJEO SOLOSTAR 300 UNIT/ML SOPN Inject 40-50 Units into  the skin 2 (two) times daily. Take 50 units in the morning and Take 40 units in the evening.    . TRULICITY [id number] SOPN Inject 0.75 mg into the skin once a week.     . Vitamin D, Ergocalciferol, (DRISDOL) 1.25 MG (50000 UT) CAPS capsule Take 50,000 Units by mouth every 7 (seven) days. Take on Sunday.    [name] albuterol (PROVENTIL HFA;VENTOLIN HFA) 108 (90 Base) MCG/ACT inhaler Inhale 1-2 puffs into the lungs every 6 (six) hours as needed for wheezing or shortness of breath. 1 Inhaler 3   No facility-administered medications prior to visit.   Review of Systems  Review of Systems  Constitutional: Negative.   Respiratory: Negative for cough, shortness of breath and wheezing.        Dyspnea on exertion  Cardiovascular: Negative.    Physical Exam  BP (!) 156/80 (BP Location: Left Arm, Cuff Size: Normal)   Pulse 90   Temp (!) 97.3 F (36.3 C) (Temporal)   Ht 4\' 11"  (1.499 m)   Wt 213 lb 9.6 oz (96.9 kg)   LMP 06/13/2012   SpO2 96%   BMI 43.14 kg/m  Physical Exam Constitutional:      Appearance: Normal appearance.  Cardiovascular:     Rate and Rhythm: Normal rate and regular rhythm.  Pulmonary:     Effort: Pulmonary effort is normal.     Breath sounds: Normal breath sounds.  Neurological:     Mental Status: She is alert.  Psychiatric:        Mood and Affect: Mood normal.        Behavior: Behavior normal.        Thought Content: Thought content normal.        Judgment: Judgment normal.      Lab Results:  CBC    Component Value Date/Time   WBC 7.3 06/30/2019 2007   RBC 5.15 (H) 06/30/2019 2007   HGB 10.6 (L) 06/30/2019 2007   HGB 11.7 08/08/2017 1001   HGB 9.7 (L) 02/05/2017 1104   HCT 37.7 06/30/2019 2007   HCT 31.7 (L) 02/05/2017 1104   PLT 394 06/30/2019 2007   PLT 189 08/08/2017 1001   PLT 236 02/05/2017 1104   MCV 73.2 (L) 06/30/2019 2007   MCV 69.2 (L) 02/05/2017 1104   MCH 20.6 (L) 06/30/2019 2007   MCHC 28.1 (L) 06/30/2019 2007   RDW 21.2 (H)  06/30/2019 2007   RDW 20.0 (H) 02/05/2017 1104   LYMPHSABS 2.0 11/07/2017 1048   LYMPHSABS 0.9 02/05/2017 1104   MONOABS 0.5 11/07/2017 1048   MONOABS 0.5 02/05/2017 1104   EOSABS 0.1 11/07/2017 1048   EOSABS 0.1 02/05/2017 1104   BASOSABS 0.0 11/07/2017 1048   BASOSABS 0.0 02/05/2017 1104    BMET    Component Value Date/Time   NA 133 (L) 06/30/2019 2230   NA 137 06/07/2013 0917   K 4.9 06/30/2019 2230   K 4.1 06/07/2013 0917   CL 102 06/30/2019 2230   CL 102 09/09/2012  1010   CO2 21 (L) 06/30/2019 2230   CO2 23 06/07/2013 0917   GLUCOSE 128 (H) 06/30/2019 2230   GLUCOSE 183 (H) 06/07/2013 0917   GLUCOSE 237 (H) 09/09/2012 1010   BUN 21 (H) 06/30/2019 2230   BUN 12.0 06/07/2013 0917   CREATININE 0.98 06/30/2019 2230   CREATININE 0.8 06/07/2013 0917   CALCIUM 10.9 (H) 06/30/2019 2230   CALCIUM 10.4 06/07/2013 0917   GFRNONAA >60 06/30/2019 2230   GFRAA >60 06/30/2019 2230    BNP No results found for: BNP  ProBNP    Component Value Date/Time   PROBNP 175.8 (H) 07/10/2014 1320    Imaging: MM DIAG BREAST TOMO BILATERAL  Result Date: 10/29/2019 CLINICAL DATA:  Diffuse pain within the upper inner quadrant of the LEFT breast for 2 weeks, but no pain today. EXAM: DIGITAL DIAGNOSTIC BILATERAL MAMMOGRAM WITH CAD AND TOMO COMPARISON:  Previous exam(s). ACR Breast Density Category b: There are scattered areas of fibroglandular density. FINDINGS: There are no new dominant masses, suspicious calcifications or secondary signs of malignancy within either breast. Specifically, there is no mammographic abnormality within the upper inner quadrant of the LEFT breast corresponding to the area of clinical concern. Mammographic images were processed with CAD. IMPRESSION: Screening mammogram in one year.(Code:SM-B-01Y) RECOMMENDATION: 1.  Screening mammogram in one year.(Code:SM-B-01Y) 2. Benign causes of breast pain, and possible remedies, were discussed with the patient. I have discussed the  findings and recommendations with the patient. If applicable, a reminder letter will be sent to the patient regarding the next appointment. BI-RADS CATEGORY  1: Negative. Electronically Signed   By: Franki Cabot M.D.   On: 10/29/2019 09:08     Assessment & Plan:   Sarcoidosis PFTs compared to 2018 do show a decrease in her FEV1 and DLCO however this was not correlated with her hemoglobin and does correct for her lung volumes.  It looks like she got a tooth pulled before her PFTs so this may have affected her results possibly.   Clinically she appears stable, she has no chest pain or cough.  Her breathing is better with regular use of Symbicort.  Scheduled for 6-minute walk test.  Has not had imaging since 2018.  She declined chest x-ray at today's visit.  She will follow-up in 3 months, considering high resolution CAT scan at follow-up.  She is adverse to restarting oral prednisone.      Asthma -Continue Symbicort 160 two puffs twice daily; as needed albuterol 2 puffs every 4-6 hours as needed for breakthrough shortness of breath or wheezing  OSA (obstructive sleep apnea) -Patient is 100% compliant with BiPAP -Pressure 16/12; AHI 0.1/HR -No changes -Continue to encourage patient to wear BiPAP for 4 to 6 hours or more each night, advised patient not to drive if experiencing excessive daytime fatigue or somnolence     Martyn Ehrich, NP 11/10/2019

## 2019-11-09 NOTE — Patient Instructions (Addendum)
Your pulmonary function test did show slight decrease in lung function over the last 2 years.  We will continue to monitor this and check a walk test to see if you need oxygen   Recommendations: Continue Symbicort 2 puffs TWICE daily Use albuterol rescue inhaler 2 puffs every 4-6 hours as needed for breakthrough shortness of breath or wheezing Recommend that you continue to work on weight loss and stay active Continue wearing BiPAP every night, great work!!  If you develop increased shortness of breath, chest pain or cough please notify office  Orders: Rescheduled 6MWT   Follow-up: 3 months with Dr. Halford Chessman or next available    CPAP and BPAP Information CPAP and BPAP are methods of helping a person breathe with the use of air pressure. CPAP stands for "continuous positive airway pressure." BPAP stands for "bi-level positive airway pressure." In both methods, air is blown through your nose or mouth and into your air passages to help you breathe well. CPAP and BPAP use different amounts of pressure to blow air. With CPAP, the amount of pressure stays the same while you breathe in and out. With BPAP, the amount of pressure is increased when you breathe in (inhale) so that you can take larger breaths. Your health care provider will recommend whether CPAP or BPAP would be more helpful for you. Why are CPAP and BPAP treatments used? CPAP or BPAP can be helpful if you have:  Sleep apnea.  Chronic obstructive pulmonary disease (COPD).  Heart failure.  Medical conditions that weaken the muscles of the chest including muscular dystrophy, or neurological diseases such as amyotrophic lateral sclerosis (ALS).  Other problems that cause breathing to be weak, abnormal, or difficult. CPAP is most commonly used for obstructive sleep apnea (OSA) to keep the airways from collapsing when the muscles relax during sleep. How is CPAP or BPAP administered? Both CPAP and BPAP are provided by a small machine  with a flexible plastic tube that attaches to a plastic mask. You wear the mask. Air is blown through the mask into your nose or mouth. The amount of pressure that is used to blow the air can be adjusted on the machine. Your health care provider will determine the pressure setting that should be used based on your individual needs. When should CPAP or BPAP be used? In most cases, the mask only needs to be worn during sleep. Generally, the mask needs to be worn throughout the night and during any daytime naps. People with certain medical conditions may also need to wear the mask at other times when they are awake. Follow instructions from your health care provider about when to use the machine. What are some tips for using the mask?   Because the mask needs to be snug, some people feel trapped or closed-in (claustrophobic) when first using the mask. If you feel this way, you may need to get used to the mask. One way to do this is by holding the mask loosely over your nose or mouth and then gradually applying the mask more snugly. You can also gradually increase the amount of time that you use the mask.  Masks are available in various types and sizes. Some fit over your mouth and nose while others fit over just your nose. If your mask does not fit well, talk with your health care provider about getting a different one.  If you are using a mask that fits over your nose and you tend to breathe through your mouth,  a chin strap may be applied to help keep your mouth closed.  The CPAP and BPAP machines have alarms that may sound if the mask comes off or develops a leak.  If you have trouble with the mask, it is very important that you talk with your health care provider about finding a way to make the mask easier to tolerate. Do not stop using the mask. Stopping the use of the mask could have a negative impact on your health. What are some tips for using the machine?  Place your CPAP or BPAP machine on a  secure table or stand near an electrical outlet.  Know where the on/off switch is located on the machine.  Follow instructions from your health care provider about how to set the pressure on your machine and when you should use it.  Do not eat or drink while the CPAP or BPAP machine is on. Food or fluids could get pushed into your lungs by the pressure of the CPAP or BPAP.  Do not smoke. Tobacco smoke residue can damage the machine.  For home use, CPAP and BPAP machines can be rented or purchased through home health care companies. Many different brands of machines are available. Renting a machine before purchasing may help you find out which particular machine works well for you.  Keep the CPAP or BPAP machine and attachments clean. Ask your health care provider for specific instructions. Get help right away if:  You have redness or open areas around your nose or mouth where the mask fits.  You have trouble using the CPAP or BPAP machine.  You cannot tolerate wearing the CPAP or BPAP mask.  You have pain, discomfort, and bloating in your abdomen. Summary  CPAP and BPAP are methods of helping a person breathe with the use of air pressure.  Both CPAP and BPAP are provided by a small machine with a flexible plastic tube that attaches to a plastic mask.  If you have trouble with the mask, it is very important that you talk with your health care provider about finding a way to make the mask easier to tolerate. This information is not intended to replace advice given to you by your health care provider. Make sure you discuss any questions you have with your health care provider. Document Revised: 11/04/2018 Document Reviewed: 06/03/2016 Elsevier Patient Education  [place].  Sarcoidosis  Sarcoidosis is a disease that can cause inflammation in many areas of the body. It most often affects the lungs (pulmonary sarcoidosis). Sarcoidosis can also affect the lymph nodes, liver,  eyes, skin, heart, or any other body tissue. Normally, cells that are part of your body's disease-fighting system (immune system) attack harmful substances (such as germs) in your body. This immune system response causes inflammation. After the harmful substance is destroyed, the inflammation and the immune cells go away. When you have sarcoidosis, your immune system causes inflammation even when there are no harmful substances, and the inflammation does not go away. Sarcoidosis also causes cells from your immune system to form small clumps of tissue (granulomas) in the affected area of your body. What are the causes? The exact cause of sarcoidosis is not known.  It is possible that if you have a family history of this disease (genetic predisposition), the immune system response that leads to inflammation may be triggered by something in your environment, such as:  Bacteria or viruses.  Metals.  Chemicals.  Dust.  Mold or mildew. What increases the  risk? You may be at a greater risk for sarcoidosis if you:  Have a family history of the disease.  Are African-American.  Are of Northern European descent.  Are 38-1 years old.  Work as a Airline pilot.  Work in an environment where you are exposed to metals, chemicals, mold or mildew, or insecticides. What are the signs or symptoms? Some people with sarcoidosis have no symptoms. Others have very mild symptoms. The symptoms usually depend on the organ that is affected. Sarcoidosis most often affects the lungs, which may include symptoms such as:  Chest pain.  Coughing.  Wheezing.  Shortness of breath. Other common symptoms include:  Night sweats.  Fever.  Weight loss.  Fatigue.  Swollen lymph nodes.  Joint pain. How is this diagnosed? Sarcoidosis may be diagnosed based on:  Your symptoms and medical history.  A physical exam.  Imaging tests to check for granulomas such as: ? Chest X-ray. ? CT scan. ? MRI. ? PET  scan.  Lung function tests. These tests evaluate your breathing and check for problems that may be related to sarcoidosis.  A procedure to remove a tissue sample for testing (biopsy). You may have a biopsy of lung tissue if that is where you are having symptoms. You may have tests to check for any complications of the condition. These tests may include:  Eye exams.  MRI of the heart or brain.  Echocardiogram.  Electrocardiogram (EKG or ECG). How is this treated? In some cases, sarcoidosis does not require a specific treatment because it causes no symptoms or mild symptoms. If your symptoms bother you or are severe, you may be prescribed medicines to reduce inflammation or relieve symptoms. These medicines may include:  Prednisone. This is a steroid that reduces inflammation related to sarcoidosis.  Hydroxychloroquine. This may be used to treat sarcoidosis that affects the skin, eyes, or brain.  Methotrexate, leflunomide, or azathioprine. These medicines affect the immune system and can help with sarcoidosis in the joints, eyes, skin, or lungs.  Medicines that you breathe in (inhalers). Inhalers can help you breathe if sarcoidosis affects your lungs. Follow these instructions at home:   Do not use any products that contain nicotine or tobacco, such as cigarettes and e-cigarettes. If you need help quitting, ask your health care provider.  Avoid secondhand smoke and irritating dust or chemicals. Stay indoors on days when air quality is poor in your area.  Return to your normal activities as told by your health care provider. Ask your health care provider what activities are safe for you.  Take or use over-the-counter and prescription medicines only as told by your health care provider.  Keep all follow-up visits as told by your health care provider. This is important. Contact a health care provider if:  You have vision problems.  You have a dry cough that does not go away.  You  have an irregular heartbeat.  You have pain or aches in your joints, hands, or feet.  You have an unexplained rash. Get help right away if:  You have chest pain.  You have difficulty breathing. Summary  Sarcoidosis is a disease that can cause inflammation in many body areas of the body. It most often affects the lungs (pulmonary sarcoidosis). It can also affect the lymph nodes, liver, eyes, skin, heart, or any other body tissue.  When you have sarcoidosis, cells from your immune system form small clumps of tissue (granulomas) in the affected area of your body.  Sarcoidosis sometimes does not  require a specific treatment because it causes no symptoms or mild symptoms.  If your symptoms bother you or are severe, you may be prescribed medicines to reduce inflammation or relieve symptoms. This information is not intended to replace advice given to you by your health care provider. Make sure you discuss any questions you have with your health care provider. Document Revised: 06/27/2017 Document Reviewed: 04/22/2017 Elsevier Patient Education  2020 [employer].

## 2019-11-10 NOTE — Assessment & Plan Note (Addendum)
-  Continue Symbicort 160 two puffs twice daily; as needed albuterol 2 puffs every 4-6 hours as needed for breakthrough shortness of breath or wheezing

## 2019-11-10 NOTE — Assessment & Plan Note (Signed)
-  Patient is 100% compliant with BiPAP -Pressure 16/12; AHI 0.1/HR -No changes -Continue to encourage patient to wear BiPAP for 4 to 6 hours or more each night, advised patient not to drive if experiencing excessive daytime fatigue or somnolence

## 2019-11-10 NOTE — Progress Notes (Signed)
Reviewed and agree with assessment/plan.   Meira Wahba, MD [facility] 07/24/2016, 12:24 PM Pager:  [phone]  

## 2019-11-10 NOTE — Assessment & Plan Note (Signed)
PFTs compared to 2018 do show a decrease in her FEV1 and DLCO however this was not correlated with her hemoglobin and does correct for her lung volumes.  It looks like she got a tooth pulled before her PFTs so this may have affected her results possibly.   Clinically she appears stable, she has no chest pain or cough.  Her breathing is better with regular use of Symbicort.  Scheduled for 6-minute walk test.  Has not had imaging since 2018.  She declined chest x-ray at today's visit.  She will follow-up in 3 months, considering high resolution CAT scan at follow-up.  She is adverse to restarting oral prednisone.

## 2019-12-28 ENCOUNTER — Ambulatory Visit: Payer: Medicare HMO | Admitting: Pulmonary Disease

## 2019-12-28 ENCOUNTER — Ambulatory Visit: Payer: Medicare HMO | Admitting: Primary Care

## 2019-12-28 ENCOUNTER — Ambulatory Visit: Payer: Medicare HMO

## 2020-01-03 ENCOUNTER — Ambulatory Visit: Payer: Medicare HMO | Admitting: Acute Care

## 2020-01-03 ENCOUNTER — Other Ambulatory Visit: Payer: Self-pay

## 2020-01-03 ENCOUNTER — Ambulatory Visit: Payer: Medicare HMO

## 2020-01-10 ENCOUNTER — Ambulatory Visit (INDEPENDENT_AMBULATORY_CARE_PROVIDER_SITE_OTHER): Payer: Medicare HMO

## 2020-01-10 ENCOUNTER — Telehealth: Payer: Self-pay | Admitting: Acute Care

## 2020-01-10 ENCOUNTER — Encounter: Payer: Self-pay | Admitting: Acute Care

## 2020-01-10 ENCOUNTER — Ambulatory Visit (INDEPENDENT_AMBULATORY_CARE_PROVIDER_SITE_OTHER): Payer: Medicare HMO | Admitting: Acute Care

## 2020-01-10 ENCOUNTER — Other Ambulatory Visit: Payer: Self-pay

## 2020-01-10 VITALS — BP 140/90 | HR 111 | Ht 59.0 in | Wt 212.4 lb

## 2020-01-10 DIAGNOSIS — R0902 Hypoxemia: Secondary | ICD-10-CM

## 2020-01-10 DIAGNOSIS — G4733 Obstructive sleep apnea (adult) (pediatric): Secondary | ICD-10-CM

## 2020-01-10 DIAGNOSIS — R0600 Dyspnea, unspecified: Secondary | ICD-10-CM

## 2020-01-10 DIAGNOSIS — R0609 Other forms of dyspnea: Secondary | ICD-10-CM

## 2020-01-10 DIAGNOSIS — D869 Sarcoidosis, unspecified: Secondary | ICD-10-CM

## 2020-01-10 MED ORDER — PREDNISONE 10 MG PO TABS: ORAL_TABLET | ORAL | 0 refills | Status: DC

## 2020-01-10 NOTE — Patient Instructions (Addendum)
It is good to see you today. Your oxygen saturations have dropped today to 77% with your 6 minute walk.  We will order oxygen for you today.  We will order a POC that will allow you to be ambulatory. Please wear at 2 L [place] with activity  to keep oxygen sats > 88%. Please get an oxygen saturation monitor to monitor your oxygen saturations.  It is ok to take oxygen off when you are resting as long as your saturations are > 88%.  We will order an HRCT chest to evaluate your sarcoid.( Please schedule and do within the week)  We will prescribe a prednisone taper. Prednisone taper; 10 mg tablets: 4 tabs x 2 days, 3 tabs x 2 days, 2 tabs x 2 days 1 tab x 2 days then stop. Follow up in 1 week with Judson Roch NP to review CT and determine plan of care. We will need to order a 2 D Echo at follow up.    Continue on BiPAP at bedtime. You appear to be benefiting from the treatment  Your compliance is amazing. Keep it up.  Goal is to wear for at least 6 hours each night for maximal clinical benefit. Continue to work on weight loss, as the link between excess weight  and sleep apnea is well established.   Remember to establish a good bedtime routine, and work on sleep hygiene.  Limit daytime naps , avoid stimulants such as caffeine and nicotine close to bedtime, exercise daily to promote sleep quality, avoid heavy , spicy, fried , or rich foods before bed. Ensure adequate exposure to natural light during the day,establish a relaxing bedtime routine with a pleasant sleep environment ( Bedroom between 60 and 67 degrees, turn off bright lights , TV or device screens screens , consider black out curtains or white noise machines) Do not drive if sleepy. Remember to clean mask, tubing, filter, and reservoir once weekly with soapy water.  Follow up with Judson Roch NP   In 6 months or before as needed.   Please contact office for sooner follow up if symptoms do not improve or worsen or seek emergency care

## 2020-01-10 NOTE — Progress Notes (Signed)
History of Present Illness Cassandra Burns is a 54 y.o. female former smoker ( But does smoke weed occasionally)  with pulmonary sarcoidosis, asthma, morbid obesity hypoventilation syndrome   and OSA. She is treated with BiPAP. She is followed by Dr. Halford Chessman. She has been off prednisone since 12/2018.      6/14/2021Pt. Presents for follow up. She had a 6 minute walk today. She dropped her sats  to 78%, requiring oxygen.PFT's were done recently and are as noted below. She states She has had worsening  short of breath in the heat. We have discussed the needs to wear her oxygen for saturations of < 88%. She has been tolerating hypoxemia, but we discussed the damage to the heart and other body systems with hypoxemia. She has agreed to wear oxygen with exertion, and to keep an eye on her saturations at rest. We have ordered oxygen for her.  She states he has had a bit of additional weight gain. She suspects this has contributed to her shortness of breath. She has not had imaging of her chest since CXR 06/2019, last CT Chest was 04/2017.   Test Results: 01/10/2020: 6 minute walk Six Minute Walk - 01/10/20 1145              Six Minute Walk    Medications taken before test (dose and time) no meds taken     Supplemental oxygen during test? No     Lap distance in meters  34 meters     Laps Completed 7     Partial lap (in meters) 20 meters     Baseline BP (sitting) 130/88     Baseline Heartrate 96     Baseline Dyspnea (Borg Scale) 0     Baseline Fatigue (Borg Scale) 0     Baseline SPO2 91 %          End of Test Values     BP (sitting) 160/100     Heartrate 122     Dyspnea (Borg Scale) 0.5     Fatigue (Borg Scale) 0     SPO2 79 %   pt was placed on 2L O2.         2 Minutes Post Walk Values    BP (sitting) 140/90     Heartrate 111     SPO2 96 %   on 2L O2    Stopped or paused before six minutes? No          Interpretation    Distance completed 258 meters      Tech Comments: Pt walked at an average pace completing the entire 64min without stopping. Pt was qualified for O2 as sats after 37min were 79%. After 2 min, pt's sats went back up to 96%. Sats remained stable on 2L O2.            Air View Down Load 12/11/2019-01/09/2020 Air curve 10 V auto Usage days 30 of 30 or 100% Greater than 4 hours 29 days or 97% Less than 4 hours 1 day or 3% Average usage total days 8 hours 43 minutes Spontaneous mode IPAP of 16 cm H2O EPAP of 12 cm H2O Easy breathe on Median leaks per liter per minute 8.2 AHI of 0.4   PFTs 05/19/17 FEV 1.17 (60%), DLCOcor 10.99L (58%), DL/VA 113%  11/08/19 FEV1 0.8 (42%), DLCOunc 9.95 (55%), DL/VA 126%  No Known Allergies   Echo 07/11/2014 Left ventricle: The cavity size was normal. Wall thickness was  increased in a pattern of moderate LVH. Systolic function was  normal. The estimated ejection fraction was in the range of 55%  to 60%.  - Left atrium: The atrium was mildly dilated.  - Atrial septum: No defect or patent foramen ovale was identified.  - Impressions: Very poor image quality consider MRI to quantitatie  EF and look for RWMA;s if clinically indicated   CBC Latest Ref Rng & Units 06/30/2019 11/07/2017 08/08/2017  WBC 4.0 - 10.5 K/uL 7.3 6.7 6.7  Hemoglobin 12.0 - 15.0 g/dL 10.6(L) 11.8 11.7  Hematocrit 36 - 46 % 37.7 38.6 38.3  Platelets 150 - 400 K/uL 394 213 189    BMP Latest Ref Rng & Units 06/30/2019 04/21/2017 03/30/2017  Glucose 70 - 99 mg/dL 128(H) 175(H) 183(H)  BUN 6 - 20 mg/dL 21(H) 13 13  Creatinine 0.44 - 1.00 mg/dL 0.98 0.82 0.74  Sodium 135 - 145 mmol/L 133(L) 138 135  Potassium 3.5 - 5.1 mmol/L 4.9 4.1 3.9  Chloride 98 - 111 mmol/L 102 103 100(L)  CO2 22 - 32 mmol/L 21(L) 26 25  Calcium 8.9 - 10.3 mg/dL 10.9(H) 10.5 10.7(H)    BNP No results found for: BNP  ProBNP    Component Value Date/Time   PROBNP 175.8 (H) 07/10/2014 1320    PFT    Component Value Date/Time    FEV1PRE 0.88 11/08/2019 1011   FEV1POST 0.80 11/08/2019 1011   FVCPRE 1.00 11/08/2019 1011   FVCPOST 0.87 11/08/2019 1011   TLC 2.50 11/08/2019 1011   DLCOUNC 9.95 11/08/2019 1011   PREFEV1FVCRT 89 11/08/2019 1011   PSTFEV1FVCRT 91 11/08/2019 1011    No results found.   Past medical hx Past Medical History:  Diagnosis Date  . Anxiety   . Asthma   . Bone pain 06/07/2013  . Depression   . 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 Hernia of abdominal cavity 10/25/2013  . Hypertension   . Iron deficiency anemia due to chronic blood loss   . Lymphadenopathy 06/07/2013  . Menometrorrhagia   . Obesity 12/18/2012  . Psoriasis   . Sarcoidosis 07/26/2013  . Sleep apnea   . Unspecified deficiency anemia 07/26/2013     Social History   Tobacco Use  . Smoking status: Former Smoker    Packs/day: 0.25    Years: 2.00    Pack years: 0.50    Types: Cigars    Quit date: 06/30/2017    Years since quitting: 2.5  . Smokeless tobacco: Never Used  . Tobacco comment: Smokes Marijuana (once daily)  Vaping Use  . Vaping Use: Never used  Substance Use Topics  . Alcohol use: Yes    Alcohol/week: 0.0 standard drinks    Comment: all monthly  . Drug use: No    Frequency: 7.0 times per week    Types: Marijuana    Comment: Marijuana--once a week    Ms.Brasington reports that she quit smoking about 2 years ago. Her smoking use included cigars. She has a 0.50 pack-year smoking history. She has never used smokeless tobacco. She reports current alcohol use. She reports that she does not use drugs.  Tobacco Cessation: Current some day smoker.  Counseled on smoking cessation given  Past surgical hx, Family hx, Social hx all reviewed.  Current Outpatient Medications on File Prior to Visit  Medication Sig  . acetaminophen (TYLENOL) 500 MG tablet Take 500 mg by mouth every 6 (six) hours as needed for mild pain.  [name] albuterol (PROVENTIL) (2.5  MG/3ML) 0.083% nebulizer solution Take 3 mLs (2.5 mg  total) by nebulization every 6 (six) hours as needed for wheezing or shortness of breath.  [name] albuterol (VENTOLIN HFA) 108 (90 Base) MCG/ACT inhaler Inhale 1-2 puffs into the lungs every 6 (six) hours as needed for wheezing or shortness of breath.  [name] amLODipine (NORVASC) 10 MG tablet Take 1 tablet (10 mg total) by mouth daily.  . budesonide-formoterol (SYMBICORT) 160-4.5 MCG/ACT inhaler Inhale 2 puffs into the lungs 2 (two) times daily.  [name] gabapentin (NEURONTIN) 400 MG capsule Take 400 mg by mouth every 6 (six) hours.  [name] losartan-hydrochlorothiazide (HYZAAR) 100-12.5 MG tablet Take 1 tablet by mouth daily.   . metFORMIN (GLUCOPHAGE) 1000 MG tablet Take 1,000 mg by mouth daily with breakfast.   . predniSONE (DELTASONE) 10 MG tablet Take 4 tabs x 2 days, then 3 x 2d, then 2 x 2d, then 1 x 2d and STOP  . TOUJEO SOLOSTAR 300 UNIT/ML SOPN Inject 40-50 Units into the skin 2 (two) times daily. Take 50 units in the morning and Take 40 units in the evening.  . TRULICITY 6.27 OJ/5.0KX SOPN Inject 0.75 mg into the skin once a week.   . Vitamin D, Ergocalciferol, (DRISDOL) 1.25 MG (50000 UT) CAPS capsule Take 50,000 Units by mouth every 7 (seven) days. Take on Sunday.  . [DISCONTINUED] labetalol (NORMODYNE) 100 MG tablet Take 1 tablet (100 mg total) by mouth 2 (two) times daily. (Patient not taking: Reported on 06/30/2019)   No current facility-administered medications on file prior to visit.     No Known Allergies  Review Of Systems:  Constitutional:   No  weight loss, night sweats,  Fevers, chills, fatigue, or  lassitude.  HEENT:   No headaches,  Difficulty swallowing,  Tooth/dental problems, or  Sore throat,                No sneezing, itching, ear ache, nasal congestion, post nasal drip,   CV:  No chest pain,  Orthopnea, PND, swelling in lower extremities, anasarca, dizziness, palpitations, syncope.   GI  No heartburn, indigestion, abdominal pain, nausea, vomiting, diarrhea, change in bowel habits,  loss of appetite, bloody stools.   Resp: No shortness of breath with exertion or at rest.  No excess mucus, no productive cough,  No non-productive cough,  No coughing up of blood.  No change in color of mucus.  No wheezing.  No chest wall deformity  Skin: no rash or lesions.  GU: no dysuria, change in color of urine, no urgency or frequency.  No flank pain, no hematuria   MS:  No joint pain or swelling.  No decreased range of motion.  No back pain.  Psych:  No change in mood or affect. No depression or anxiety.  No memory loss.   Vital Signs BP 140/90 (BP Location: Left Arm, Cuff Size: Normal)   Pulse (!) 111   Ht 4\' 11"  (1.499 m)   Wt 212 lb 6.4 oz (96.3 kg)   LMP 06/13/2012   SpO2 96%   BMI 42.90 kg/m    Physical Exam:  General- No distress,  A&Ox3, very pleasant ENT: No sinus tenderness, TM clear, pale nasal mucosa, no oral exudate,no post nasal drip, no LAN Cardiac: S1, S2, regular rate and rhythm, no murmur Chest: + wheeze/ No rales/ dullness; mild accessory muscle use, no nasal flaring, no sternal retractions Abd.: Soft Non-tender, ND, BS +. Body mass index is 42.9 kg/m. Ext: No clubbing cyanosis, edema Neuro:  normal strength, MAE x 4, A&O x 3, appropriate Skin: No rashes, No lesions, warm and dry Psych: normal mood and behavior   Assessment/Plan Hypoxemia on 6 minute walk Sarcoidosis Plan Your oxygen saturations have dropped today to 77% with your 6 minute walk.  We will order oxygen for you today.  We will order a POC that will allow you to be ambulatory. Please wear at 2 L [place] with activity  to keep oxygen sats > 88%. Please get an oxygen saturation monitor to monitor your oxygen saturations.  It is ok to take oxygen off when you are resting as long as your saturations are > 88%.  We will order an HRCT chest to evaluate your sarcoidosis.( Please schedule and do within the week)  We will prescribe a prednisone taper. Prednisone taper; 10 mg tablets: 4 tabs x  2 days, 3 tabs x 2 days, 2 tabs x 2 days 1 tab x 2 days then stop. Follow up in 1 week with Judson Roch NP to review CT and determine plan of care. Follow up sooner or seek emergency care if you feel your condition is worsening. We will need to order a 2 D Echo at follow up.   OSA on BiPAP Plan Continue on BiPAP at bedtime. You appear to be benefiting from the treatment  Your compliance is amazing. Keep it up.  Goal is to wear for at least 6 hours each night for maximal clinical benefit. Continue to work on weight loss, as the link between excess weight  and sleep apnea is well established.   Remember to establish a good bedtime routine, and work on sleep hygiene.  Limit daytime naps , avoid stimulants such as caffeine and nicotine close to bedtime, exercise daily to promote sleep quality, avoid heavy , spicy, fried , or rich foods before bed. Ensure adequate exposure to natural light during the day,establish a relaxing bedtime routine with a pleasant sleep environment ( Bedroom between 60 and 67 degrees, turn off bright lights , TV or device screens screens , consider black out curtains or white noise machines) Do not drive if sleepy. Remember to clean mask, tubing, filter, and reservoir once weekly with soapy water.  Follow up with Judson Roch NP   In 6 months or before as needed.   Please contact office for sooner follow up if symptoms do not improve or worsen or seek emergency care    This appointment was 30 min long with over 50% of the time in direct face-to-face patient care, assessment, plan of care, and follow-up.    Magdalen Spatz, NP 01/10/2020  12:13 PM

## 2020-01-10 NOTE — Telephone Encounter (Signed)
error 

## 2020-01-10 NOTE — Progress Notes (Signed)
Six Minute Walk - 01/10/20 1145      Six Minute Walk   Medications taken before test (dose and time) no meds taken    Supplemental oxygen during test? No    Lap distance in meters  34 meters    Laps Completed 7    Partial lap (in meters) 20 meters    Baseline BP (sitting) 130/88    Baseline Heartrate 96    Baseline Dyspnea (Borg Scale) 0    Baseline Fatigue (Borg Scale) 0    Baseline SPO2 91 %      End of Test Values    BP (sitting) 160/100    Heartrate 122    Dyspnea (Borg Scale) 0.5    Fatigue (Borg Scale) 0    SPO2 79 %   pt was placed on 2L O2.     2 Minutes Post Walk Values   BP (sitting) 140/90    Heartrate 111    SPO2 96 %   on 2L O2   Stopped or paused before six minutes? No      Interpretation   Distance completed 258 meters    Tech Comments: Pt walked at an average pace completing the entire 63min without stopping. Pt was qualified for O2 as sats after 49min were 79%. After 2 min, pt's sats went back up to 96%. Sats remained stable on 2L O2.

## 2020-01-10 NOTE — Progress Notes (Signed)
Reviewed and agree with assessment/plan.   Chesley Mires, MD [facility] Pulmonary/Critical Care 01/10/2020, 5:08 PM Pager:  [phone]

## 2020-01-18 ENCOUNTER — Ambulatory Visit (INDEPENDENT_AMBULATORY_CARE_PROVIDER_SITE_OTHER)
Admission: RE | Admit: 2020-01-18 | Discharge: 2020-01-18 | Disposition: A | Discharge status: Home / Self Care | Payer: Medicare HMO | Source: Ambulatory Visit | Attending: Acute Care | Admitting: Acute Care

## 2020-01-18 ENCOUNTER — Other Ambulatory Visit: Payer: Self-pay

## 2020-01-18 DIAGNOSIS — D869 Sarcoidosis, unspecified: Secondary | ICD-10-CM | POA: Diagnosis not present

## 2020-01-19 ENCOUNTER — Telehealth: Payer: Self-pay | Admitting: Acute Care

## 2020-01-19 ENCOUNTER — Encounter: Payer: Self-pay | Admitting: Acute Care

## 2020-01-19 ENCOUNTER — Ambulatory Visit (INDEPENDENT_AMBULATORY_CARE_PROVIDER_SITE_OTHER): Payer: Medicare HMO | Admitting: Acute Care

## 2020-01-19 DIAGNOSIS — G4733 Obstructive sleep apnea (adult) (pediatric): Secondary | ICD-10-CM

## 2020-01-19 DIAGNOSIS — D869 Sarcoidosis, unspecified: Secondary | ICD-10-CM

## 2020-01-19 DIAGNOSIS — J45909 Unspecified asthma, uncomplicated: Secondary | ICD-10-CM

## 2020-01-19 NOTE — Telephone Encounter (Signed)
Order has been placed for pt to have 2L O2 bled into BIPAP. Nothing further needed.

## 2020-01-19 NOTE — Progress Notes (Signed)
Virtual Visit via Telephone Note  I connected with Cassandra Burns on 01/19/20 at 11:30 AM EDT by telephone and verified that I am speaking with the correct person using two identifiers.  Location: Patient: At Home Provider: Working remotely from home   I discussed the limitations, risks, security and privacy concerns of performing an evaluation and management service by telephone and the availability of in person appointments. I also discussed with the patient that there may be a patient responsible charge related to this service. The patient expressed understanding and agreed to proceed.  Synopsis Cassandra Burns is a 54 y.o. female former smoker ( But does smoke weed occasionally)  with pulmonary sarcoidosis, asthma, morbid obesity hypoventilation syndrome   and OSA. She is treated with BiPAP. She is followed by Dr. Halford Chessman. She has been off prednisone since 12/2018.  She has had progression of sarcoid 12/2019, and now requires oxygen at 2 L [place].   History of Present Illness: Pt. Presents for follow up.Her daughter is also on the phone.  She was seen last in the office 01/10/2020 with hypoxic respiratory failure . She had presented to the office with an oxygen saturation of 77% off oxygen. We did a 6 minutes walk which qualified her for oxygen. She has been wearing her oxygen at 2 L [place], and she states this has helped her immensely. She feels much better. She has completed the prednisone taper, and she states she feels the oxygen helped her more than the prednisone. She took her last 10 mg dose this morning.She had bought a saturation monitor. She states since using her oxygen, she has not had her sats drop below 88%.She wants a portable oxygen tank. She needs to come into the office for a walk on the [place] to qualify.  She does not want to go back on prednisone as she wants to try to lose weight.HRCT of the chest done today shows progression of her sarcoid since 2018, and air trapping  consistent with small airways disease . She is compliant with her asthma medications.  We will check and ACE level when she comes in to qualify for her portable oxygen, and determine need for prednisone and treatment of sarcoid  at that visit.  She is compliant with her BiPAP machine   Observations/Objective: Less dyspnea noted  Today. She is able to speak in full sentences.  States she feels much better on oxygen  HRCT 01/19/2020 Mediastinal, hilar and pulmonary parenchymal findings of sarcoid. Pulmonary parenchymal findings have progressed from 04/29/2017. Air trapping is indicative of small airways disease. Aortic atherosclerosis (ICD10-I70.0). Coronary artery calcification.  PFTs 05/19/17 FEV 1.17 (60%), DLCOcor 10.99L (58%), DL/VA 113%  11/08/19 FEV1 0.8 (42%), DLCOunc 9.95 (55%), DL/VA 126%   Assessment and Plan: Progressive Sarcoid per imaging 12/2019 Now requires oxygen at 2 L to maintain sats > 88% Completed pred taper, but does not want to remain on prednisone  Plan Wear oxygen at 2 L  to maintain oxygen sats at > 88% at all times. Appointment to walk patient on Inogem portable oxygen device ACE level when in office for walk Follow up in 2 weeks with Judson Roch NP to  Review results and determine plan for sarcoid, and ensure she has received portable oxygen device We will need to repeat PFT's We will call you to schedule Continue Symbicort daily and Albuterol as needed  as you have been doing . Rinse mouth after use Continue BiPAP therapy every night as you have been doing.  We will place an order to have your DME bleed oxygen into your BiPAP at 2 L [place] Please contact office for sooner follow up if symptoms do not improve or worsen or seek emergency care   Follow Up Instructions: Follow up in 2 weeks or before as needed   I discussed the assessment and treatment plan with the patient. The patient was provided an opportunity to ask questions and all were answered. The  patient agreed with the plan and demonstrated an understanding of the instructions.   The patient was advised to call back or seek an in-person evaluation if the symptoms worsen or if the condition fails to improve as anticipated.  I provided 30 minutes of non-face-to-face time during this encounter.   Magdalen Spatz, NP 01/19/2020

## 2020-01-19 NOTE — Addendum Note (Signed)
Addended by: Lorretta Harp on: 01/19/2020 04:14 PM   Modules accepted: Orders

## 2020-01-19 NOTE — Addendum Note (Signed)
Addended by: Lorretta Harp on: 01/19/2020 04:21 PM   Modules accepted: Orders

## 2020-01-19 NOTE — Telephone Encounter (Signed)
Please place order for patient's DME to have oxygen at 2 L bled into her BiPAP machine at home. Thanks so much

## 2020-01-19 NOTE — Telephone Encounter (Signed)
Called and spoke with pt to get her scheduled for appt to have qualifying walk for POC performed. Pt has been added on schedule 6/28 at 10:30. Also stated to pt when she comes Monday, we need her to have labwork done and she verbalized understanding. Also scheduled pt appt 2 weeks after 6/28 with Sarah. Nothing further needed.

## 2020-01-19 NOTE — Patient Instructions (Addendum)
It was great to talk with you today. Wear oxygen at 2 L [place] to maintain oxygen sats at > 88% at all times. Your CT showed progression of your sarcoid. We will schedule an Appointment to walk you on an Inogem portable oxygen device to get you qualified ACE level when in office for walk We will schedule you for repeat PFT's We will call you to schedule Continue BiPAP therapy as you have been doing We will place an order for you to have oxygen bled into your BiPAP machine at bedtime. Continue using your  Symbicort daily and Albuterol as needed  as you have been doing . Rinse mouth after use Follow up in 2 weeks with Judson Roch NP to  Review results and determine plan for sarcoid, and ensure she has received portable oxygen device Please contact office for sooner follow up if symptoms do not improve or worsen or seek emergency care

## 2020-01-19 NOTE — Telephone Encounter (Signed)
Please schedule patient to come in for a walk on our POC device to qualify her for portable oxygen. She will need an ACE level drawn the same day, and a follow up appointment with me  2 weeks after  in the office to review the results. Thanks so much

## 2020-01-20 NOTE — Progress Notes (Signed)
These results were discussed with the patient on a tele visit 6/23. Please see visit note.

## 2020-01-24 ENCOUNTER — Other Ambulatory Visit: Payer: Self-pay

## 2020-01-24 ENCOUNTER — Ambulatory Visit: Payer: Medicare HMO

## 2020-01-24 DIAGNOSIS — D869 Sarcoidosis, unspecified: Secondary | ICD-10-CM

## 2020-01-24 NOTE — Progress Notes (Signed)
Reviewed and agree with assessment/plan.   Chesley Mires, MD [facility] Pulmonary/Critical Care 01/24/2020, 7:08 AM Pager:  [phone]

## 2020-01-26 LAB — ANGIOTENSIN CONVERTING ENZYME: Angiotensin-Converting Enzyme: 38 U/L (ref 9–67)

## 2020-01-27 ENCOUNTER — Encounter (HOSPITAL_COMMUNITY): Payer: Self-pay

## 2020-01-27 ENCOUNTER — Ambulatory Visit (HOSPITAL_COMMUNITY)
Admission: EM | Admit: 2020-01-27 | Discharge: 2020-01-27 | Disposition: A | Discharge status: Home / Self Care | Payer: Medicare HMO | Attending: Family Medicine | Admitting: Family Medicine

## 2020-01-27 DIAGNOSIS — R600 Localized edema: Secondary | ICD-10-CM | POA: Diagnosis not present

## 2020-01-27 DIAGNOSIS — M79602 Pain in left arm: Secondary | ICD-10-CM | POA: Diagnosis not present

## 2020-01-27 DIAGNOSIS — M25512 Pain in left shoulder: Secondary | ICD-10-CM | POA: Diagnosis not present

## 2020-01-27 DIAGNOSIS — M79605 Pain in left leg: Secondary | ICD-10-CM | POA: Diagnosis not present

## 2020-01-27 LAB — COMPREHENSIVE METABOLIC PANEL
ALT: 27 U/L (ref 0–44)
AST: 19 U/L (ref 15–41)
Albumin: 3.6 g/dL (ref 3.5–5.0)
Alkaline Phosphatase: 135 U/L — ABNORMAL HIGH (ref 38–126)
Anion gap: 13 (ref 5–15)
BUN: 15 mg/dL (ref 6–20)
CO2: 17 mmol/L — ABNORMAL LOW (ref 22–32)
Calcium: 9.5 mg/dL (ref 8.9–10.3)
Chloride: 104 mmol/L (ref 98–111)
Creatinine, Ser: 0.97 mg/dL (ref 0.44–1.00)
GFR calc Af Amer: >60 mL/min (ref >60)
GFR calc non Af Amer: >60 mL/min (ref >60)
Glucose, Bld: 390 mg/dL — ABNORMAL HIGH (ref 70–99)
Potassium: 4.6 mmol/L (ref 3.5–5.1)
Sodium: 134 mmol/L — ABNORMAL LOW (ref 135–145)
Total Bilirubin: 0.5 mg/dL (ref 0.3–1.2)
Total Protein: 7.5 g/dL (ref 6.5–8.1)

## 2020-01-27 LAB — POCT URINALYSIS DIP (DEVICE)
Bilirubin Urine: NEGATIVE
Glucose, UA: 500 mg/dL — AB
Ketones, ur: NEGATIVE mg/dL
Leukocytes,Ua: NEGATIVE
Nitrite: NEGATIVE
Protein, ur: NEGATIVE mg/dL
Specific Gravity, Urine: 1.015 (ref 1.005–1.030)
Urobilinogen, UA: 0.2 mg/dL (ref 0.0–1.0)
pH: 5 (ref 5.0–8.0)

## 2020-01-27 LAB — CBC
HCT: 40.7 % (ref 36.0–46.0)
Hemoglobin: 12 g/dL (ref 12.0–15.0)
MCH: 21.5 pg — ABNORMAL LOW (ref 26.0–34.0)
MCHC: 29.5 g/dL — ABNORMAL LOW (ref 30.0–36.0)
MCV: 72.9 fL — ABNORMAL LOW (ref 80.0–100.0)
Platelets: 263 10*3/uL (ref 150–400)
RBC: 5.58 MIL/uL — ABNORMAL HIGH (ref 3.87–5.11)
RDW: 18.9 % — ABNORMAL HIGH (ref 11.5–15.5)
WBC: 6 10*3/uL (ref 4.0–10.5)
nRBC: 0 % (ref 0.0–0.2)

## 2020-01-27 NOTE — ED Provider Notes (Signed)
[place]   [id number] 01/27/20 Arrival Time: 0906  NA:TFTDD PAIN  SUBJECTIVE: History from: patient. Cassandra Burns is a 54 y.o. female complains of right shoulder pain that began weeks ago.  Reports that she wants it looked at while she is here.  She is mostly concerned about lower extremity edema that she has been experiencing for the past couple weeks.  Reports that she was up a lot yesterday, and that her left ankle and leg were swollen more than they had been in the past.  She has not seen any medical provider about these issues before.  Denies injury.  Denies similar symptoms in the past.  Denies fever, chills, erythema, ecchymosis, effusion, weakness, numbness and tingling, saddle paresthesias, loss of bowel or bladder function.      ROS: As per HPI.  All other pertinent ROS negative.     Past Medical History:  Diagnosis Date  . Anxiety   . Asthma   . Bone pain 06/07/2013  . Depression   . 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 Hernia of abdominal cavity 10/25/2013  . Hypertension   . Iron deficiency anemia due to chronic blood loss   . Lymphadenopathy 06/07/2013  . Menometrorrhagia   . Obesity 12/18/2012  . Psoriasis   . Sarcoidosis 07/26/2013  . Sleep apnea   . Unspecified deficiency anemia 07/26/2013   Past Surgical History:  Procedure Laterality Date  . TUBAL LIGATION     No Known Allergies No current facility-administered medications on file prior to encounter.   Current Outpatient Medications on File Prior to Encounter  Medication Sig Dispense Refill  . albuterol (PROVENTIL) (2.5 MG/3ML) 0.083% nebulizer solution Take 3 mLs (2.5 mg total) by nebulization every 6 (six) hours as needed for wheezing or shortness of breath. 75 mL 12  . amLODipine (NORVASC) 10 MG tablet Take 1 tablet (10 mg total) by mouth daily. 30 tablet 1  . budesonide-formoterol (SYMBICORT) 160-4.5 MCG/ACT inhaler Inhale 2 puffs into the lungs 2 (two) times daily. 1 Inhaler 6    . gabapentin (NEURONTIN) 400 MG capsule Take 400 mg by mouth every 6 (six) hours.    [name] labetalol (NORMODYNE) 100 MG tablet Take 100 mg by mouth 2 (two) times daily.    [name] losartan-hydrochlorothiazide (HYZAAR) 100-12.5 MG tablet Take 1 tablet by mouth daily.     . metFORMIN (GLUCOPHAGE) 1000 MG tablet Take 1,000 mg by mouth daily with breakfast.     . TRULICITY 2.20 UR/4.2HC SOPN Inject 0.75 mg into the skin once a week.     [name] acetaminophen (TYLENOL) 500 MG tablet Take 500 mg by mouth every 6 (six) hours as needed for mild pain.    [name] albuterol (VENTOLIN HFA) 108 (90 Base) MCG/ACT inhaler Inhale 1-2 puffs into the lungs every 6 (six) hours as needed for wheezing or shortness of breath. 18 g 6  . fluticasone (FLONASE) 50 MCG/ACT nasal spray SMARTSIG:2 Spray(s) Both Nares Every Evening    . predniSONE (DELTASONE) 10 MG tablet Take 4 tabs x 2 days, then 3 x 2d, then 2 x 2d, then 1 x 2d and STOP 20 tablet 0  . predniSONE (DELTASONE) 10 MG tablet Take 4 tabs for 2 days, then 3 tabs for 2 days, 2 tabs for 2 days, then 1 tab for 2 days, then stop. 20 tablet 0  . TOUJEO SOLOSTAR 300 UNIT/ML SOPN Inject 40-50 Units into the skin 2 (two) times daily. Take 50 units in the morning and  Take 40 units in the evening.    . Vitamin D, Ergocalciferol, (DRISDOL) 1.25 MG (50000 UT) CAPS capsule Take 50,000 Units by mouth every 7 (seven) days. Take on Sunday.     Social History   Socioeconomic History  . Marital status: Single    Spouse name: Not on file  . Number of children: 2  . Years of education: Not on file  . Highest education level: Not on file  Occupational History  . Occupation: homemaker  Tobacco Use  . Smoking status: Current Some Day Smoker    Packs/day: 0.25    Years: 2.00    Pack years: 0.50    Types: Cigars    Last attempt to quit: 06/30/2017    Years since quitting: 2.5  . Smokeless tobacco: Never Used  . Tobacco comment: Smokes Marijuana (once daily)  Vaping Use  . Vaping Use: Never used   Substance and Sexual Activity  . Alcohol use: Yes    Alcohol/week: 0.0 standard drinks    Comment: all monthly  . Drug use: No    Frequency: 7.0 times per week    Types: Marijuana    Comment: Marijuana--once a week  . Sexual activity: Yes  Other Topics Concern  . Not on file  Social History Narrative  . Not on file   Social Determinants of Health   Financial Resource Strain:   . Difficulty of Paying Living Expenses:   Food Insecurity:   . Worried About Charity fundraiser in the Last Year:   . Arboriculturist in the Last Year:   Transportation Needs:   . Film/video editor (Medical):   [name] Lack of Transportation (Non-Medical):   Physical Activity:   . Days of Exercise per Week:   . Minutes of Exercise per Session:   Stress:   . Feeling of Stress :   Social Connections:   . Frequency of Communication with Friends and Family:   . Frequency of Social Gatherings with Friends and Family:   . Attends Religious Services:   . Active Member of Clubs or Organizations:   . Attends Archivist Meetings:   [name] Marital Status:   Intimate Partner Violence:   . Fear of Current or Ex-Partner:   . Emotionally Abused:   [name] Physically Abused:   . Sexually Abused:    Family History  Problem Relation Age of Onset  . Diabetes type II Father   . Asthma Father   . Hypertension Father   . Heart murmur Father   . Alcohol abuse Father   . Drug abuse Father   . Hypertension Mother   . Alcohol abuse Mother   . Drug abuse Mother     OBJECTIVE:  Vitals:   01/27/20 0950  BP: 136/78  Pulse: 95  Resp: 20  Temp: 97.8 F (36.6 C)  TempSrc: Oral  SpO2: 94%    General appearance: ALERT; in no acute distress.  Head: NCAT Lungs: Normal respiratory effort CV: pedal pulses 2+ bilaterally. Cap refill < 2 seconds Musculoskeletal:  Inspection: Skin warm, dry, clear and intact without obvious erythema, effusion, or ecchymosis.  Palpation: tender to palpation at anterior aspect of  L shoulder ROM: FROM active and passive Skin: warm and dry Neurologic: Ambulates without difficulty; Sensation intact about the upper/ lower extremities Psychological: alert and cooperative; normal mood and affect  DIAGNOSTIC STUDIES:  No results found.   ASSESSMENT & PLAN:  1. Lower extremity edema   2. Acute pain  of left shoulder     Follow up with sports medicine for L shoulder pain Continue conservative management of rest, ice, and gentle stretches Take tylenol as needed for pain relief Follow up with PCP if symptoms persist Return or go to the ER if you have any new or worsening symptoms (fever, chills, chest pain, abdominal pain, changes in bowel or bladder habits, pain radiating into lower legs)   Lower Extremity Edema UA neg for protein or infection CBC and CMP drawn today Will call with abnormal results Follow up with PCP as scheduled  Reviewed expectations re: course of current medical issues. Questions answered. Outlined signs and symptoms indicating need for more acute intervention. Patient verbalized understanding. After Visit Summary given.       Faustino Congress, NP 01/27/20 1111

## 2020-01-27 NOTE — Discharge Instructions (Addendum)
UA is negative for infection  Follow up with orthopedics for shoulder discomfort, likely a soft tissue injury  We will let you know if your lab work is abnormal and go from there  Follow up with PCP as scheduled

## 2020-01-27 NOTE — ED Triage Notes (Signed)
Pt c/o recurrent left arm/leg pain for approx 1 month. Pt states left arm pain from shoulder to hand and increases with movement above head/abduction/adduction. States left lower pain increases throughout the day and experiences left foot swelling; elevates at night and swelling improves the next morning. Denies SOB, CP, jaw/back pain, slurred speech, change of vision, warmth to leg/arm.  Smile symmetrical, grips equal/moderate strength; leg strength equal/strong; +2 edema to left foot; non-pitting edema to right foot; +2 pedal pulses/radial pulses.

## 2020-01-27 NOTE — Progress Notes (Signed)
Please let patient know her ACE level is within the normal range. Thanks so much

## 2020-02-09 ENCOUNTER — Ambulatory Visit (INDEPENDENT_AMBULATORY_CARE_PROVIDER_SITE_OTHER): Payer: Medicare HMO | Admitting: Acute Care

## 2020-02-09 ENCOUNTER — Other Ambulatory Visit: Payer: Self-pay

## 2020-02-09 ENCOUNTER — Encounter: Payer: Self-pay | Admitting: Acute Care

## 2020-02-09 DIAGNOSIS — E669 Obesity, unspecified: Secondary | ICD-10-CM | POA: Diagnosis not present

## 2020-02-09 DIAGNOSIS — R0600 Dyspnea, unspecified: Secondary | ICD-10-CM | POA: Diagnosis not present

## 2020-02-09 DIAGNOSIS — D869 Sarcoidosis, unspecified: Secondary | ICD-10-CM

## 2020-02-09 DIAGNOSIS — R0902 Hypoxemia: Secondary | ICD-10-CM

## 2020-02-09 NOTE — Patient Instructions (Addendum)
It is good to talk with you today.  We will call your DME  Again today ( the orders were place 6/23) To have the oxygen at 2 L bled  into your BiPAP machine.  We will request that they let you know when the smaller oxygen tank is available for you. We will place an order for a dietitian for weight loss/ diabetes management.  We will re-refer you to pulmonary rehab ( Under Dr. Halford Chessman) Maintain oxygen sats > 88%. Ok to increase your oxygen to 3 L with strenuous activity.  We will place an order for a new strap for your oxygen tank.    Continue on BiPAP at bedtime. You appear to be benefiting from the treatment  Goal is to wear for at least 6 hours each night for maximal clinical benefit. Continue to work on weight loss, as the link between excess weight  and sleep apnea is well established.   Remember to establish a good bedtime routine, and work on sleep hygiene.  Limit daytime naps , avoid stimulants such as caffeine and nicotine close to bedtime, exercise daily to promote sleep quality, avoid heavy , spicy, fried , or rich foods before bed. Ensure adequate exposure to natural light during the day,establish a relaxing bedtime routine with a pleasant sleep environment ( Bedroom between 60 and 67 degrees, turn off bright lights , TV or device screens screens , consider black out curtains or white noise machines) Do not drive if sleepy. Remember to clean mask, tubing, filter, and reservoir once weekly with soapy water.  Follow up with Dr. Halford Chessman  In 1 month  or before as needed.   Please contact office for sooner follow up if symptoms do not improve or worsen or seek emergency care

## 2020-02-09 NOTE — Progress Notes (Signed)
Virtual Visit via Telephone Note  I connected with Cassandra Burns on 02/09/20 at 11:30 AM EDT by telephone and verified that I am speaking with the correct person using two identifiers.  Location: Patient: At home Provider: [address], [place], [place], [address]   I discussed the limitations, risks, security and privacy concerns of performing an evaluation and management service by telephone and the availability of in person appointments. I also discussed with the patient that there may be a patient responsible charge related to this service. The patient expressed understanding and agreed to proceed.  Synopsis Cassandra E Taliaferrois a 54 y.o.femaleformer smoker ( But does smoke weed occasionally)withpulmonarysarcoidosis, asthma, morbid obesity hypoventilation syndromeand OSA. She is treated with BiPAP. She is followed by Dr. Halford Chessman. She has been off prednisone since 12/2018. She has had progression of sarcoid 12/2019, and now requires oxygen at 2 L [place].   History of Present Illness: Pt. Presents for follow up of her OSA/ BiPAP therapy. She states she is doing well on her BiPAP. She has excellent compliance and AHI is 0.1 when she uses it. She states she has no morning headaches , and her daytime sleepiness continues to improve.   She had several other issues. She has not received her [employer] oxygen tank despite the fact it has been ordered and she was walked in the office on the portable tank.  Additionally, She has not  had her oxygen bled into her BiPAP as was ordered 6/23.  She continues to have  Oxygen saturation drops with walking long distances. She states she would drop into the high 70's while on her 2 L of oxygen. We are having her increase her oxygen to 3 L with exertion. She has been instructed to maintain her oxygen sats at > 88%. We discussed consequences of low oxygen levels and the heart, and brain.   She states she is doing better now that she has the  oxygen. She denies any fever, chest pain,orthopnea or hemoptysis.    Observations/Objective: HRCT Chest 01/18/2020 Mediastinal, hilar and pulmonary parenchymal findings of sarcoid. Pulmonary parenchymal findings have progressed from 04/29/2017. Air trapping is indicative of small airways disease. Aortic atherosclerosis (ICD10-I70.0). Coronary artery Calcification.  BiPAP Down Load 01/09/2020-02/07/2020 Usage days 28 of 30 or 93% Greater than 4 hours 28 days or 93% Less than 4 hours 0 days or 0% Average usage total days used 8 hours 32 minutes Air curve 10 auto V auto IPAP 16 cm H2O EPAP 12 cm H2O AHI 0.1     Assessment and Plan: OSA  Great Compliance and Control AHI is 0.1 Plan Continue on BiPAP at bedtime. You appear to be benefiting from the treatment  Goal is to wear for at least 6 hours each night for maximal clinical benefit. Continue to work on weight loss, as the link between excess weight  and sleep apnea is well established.   Remember to establish a good bedtime routine, and work on sleep hygiene.  Limit daytime naps , avoid stimulants such as caffeine and nicotine close to bedtime, exercise daily to promote sleep quality, avoid heavy , spicy, fried , or rich foods before bed. Ensure adequate exposure to natural light during the day,establish a relaxing bedtime routine with a pleasant sleep environment ( Bedroom between 60 and 67 degrees, turn off bright lights , TV or device screens screens , consider black out curtains or white noise machines) Do not drive if sleepy. Remember to clean mask, tubing, filter, and  reservoir once weekly with soapy water.  Follow up with Dr. Halford Chessman  In 6 months  or before as needed.    Sarcoidosis Progression per HRCT 01/19/2020 ACE Level within normal limits Worsening dyspnea , new oxygen requirement Physical deconditioning Plan Wear your oxygen to maintain oxygen sats > 88% at all times.  We will call to have your oxygen bled into your  BiPAP for night time use.  The original order was placed 01/19/2020 Please continue to follow up with your DME regarding when a smaller oxygen tank is available.  We will place an order for a dietitian for weight loss/ diabetes management.  We will re-refer you to pulmonary rehab ( Under Dr. Halford Chessman) [place] to increase your oxygen to 3 L with strenuous activity.  We will place an order for a new strap for your oxygen tank.  We will order a 2 D Echo to evaluate heart function ( this may be contributing to shortness of breath) We will order a cardiopulmonary Exercise test to better evaluate your shortness of breath with exertion.  We will consider methotrexate for treatment of  Sarcoid since you do not want to take any prednisone.   Hypoxic Respiratory Failure Wear your oxygen to maintain oxygen sats > 88% at all times.  We will call to have your oxygen bled into your BiPAP for night time use.  The original order was placed 01/19/2020 Wear your oxygen at 2 L [place] at all times Ok to increase your oxygen to 3 L with strenuous activity.  We will place an order for a new strap for your oxygen tank. We will place an order for Pulmonary Rehab to work on your overall physical deconditioning.   Follow Up Instructions: Follow up in 1 month with Dr. Halford Chessman or Judson Roch NP Please contact office for sooner follow up if symptoms do not improve or worsen or seek emergency care  Let us know if you need [place] sooner.    I discussed the assessment and treatment plan with the patient. The patient was provided an opportunity to ask questions and all were answered. The patient agreed with the plan and demonstrated an understanding of the instructions.   The patient was advised to call back or seek an in-person evaluation if the symptoms worsen or if the condition fails to improve as anticipated.  I provided 40  minutes of non-face-to-face time during this encounter.   Magdalen Spatz, NP 02/09/2020 5:24 PM

## 2020-02-10 ENCOUNTER — Telehealth (HOSPITAL_COMMUNITY): Payer: Self-pay

## 2020-02-10 NOTE — Telephone Encounter (Signed)
Called patient to see if he is interested in the Pulmonary Rehab Program. Patient expressed interest. Explained scheduling process and went over insurance, patient verbalized understanding. Someone from our pulmonary rehab staff will contact pt at a later time. 

## 2020-02-10 NOTE — Progress Notes (Signed)
Reviewed and agree with assessment/plan.   Chesley Mires, MD [facility] 02/10/2020, 9:42 AM Pager:  [phone]

## 2020-02-10 NOTE — Telephone Encounter (Signed)
Pt insurance is active and benefits verified through [place] $10, DED 0/0 met, out of pocket $3,900/$261.10 met, co-insurance 0%. no pre-authorization required. Passport, Sam/[employer] 02/10/2020_0 :05pm, REF# [id number]

## 2020-02-16 ENCOUNTER — Telehealth: Payer: Self-pay | Admitting: Acute Care

## 2020-02-16 NOTE — Telephone Encounter (Signed)
Patient referred to [employer]. She understands that POCs are difficult to get at this time. She does not need anything further.

## 2020-02-17 ENCOUNTER — Encounter (HOSPITAL_COMMUNITY): Payer: Self-pay | Admitting: *Deleted

## 2020-02-17 NOTE — Progress Notes (Signed)
Received referral from [place] for this pt to participate in pulmonary rehab with the the diagnosis of Pulmonary Sarcoidosis Clinical review of pt follow up appt on 7/14 with Eric Form Np with Dr. Halford Chessman Pulmonary office note.  Pt with Covid Risk Score - 4. Pt appropriate for scheduling for Pulmonary rehab.  Will forward to pulmonary rehab staff for scheduling. Cherre Huger, BSN Cardiac and Training and development officer

## 2020-02-29 ENCOUNTER — Telehealth: Payer: Self-pay | Admitting: Pulmonary Disease

## 2020-02-29 ENCOUNTER — Telehealth (HOSPITAL_COMMUNITY): Payer: Self-pay

## 2020-02-29 NOTE — Telephone Encounter (Signed)
Patient contacted with bad news about getting a POC. Patient understands they are not available and she can not afford to buy one out of pocket. Instead, [employer] is providing her with smaller tanks so she can leave the home without the larger tank.

## 2020-03-07 ENCOUNTER — Other Ambulatory Visit: Payer: Self-pay

## 2020-03-07 ENCOUNTER — Ambulatory Visit (HOSPITAL_COMMUNITY): Payer: Medicare HMO | Attending: Cardiology

## 2020-03-07 DIAGNOSIS — R0902 Hypoxemia: Secondary | ICD-10-CM | POA: Diagnosis not present

## 2020-03-07 DIAGNOSIS — R0600 Dyspnea, unspecified: Secondary | ICD-10-CM | POA: Insufficient documentation

## 2020-03-07 DIAGNOSIS — D869 Sarcoidosis, unspecified: Secondary | ICD-10-CM | POA: Insufficient documentation

## 2020-03-15 ENCOUNTER — Ambulatory Visit: Payer: Medicare HMO | Admitting: Registered"

## 2020-03-16 ENCOUNTER — Other Ambulatory Visit (HOSPITAL_COMMUNITY): Payer: Medicare HMO

## 2020-04-06 ENCOUNTER — Other Ambulatory Visit (HOSPITAL_COMMUNITY): Payer: Medicare HMO

## 2020-04-14 ENCOUNTER — Telehealth: Payer: Self-pay | Admitting: Pulmonary Disease

## 2020-04-14 NOTE — Telephone Encounter (Signed)
Contacted patient but she was in her call. She ask we call her back on Monday.

## 2020-04-17 NOTE — Telephone Encounter (Signed)
Form has been signed by Judson Roch and it has been faxed to [place] with [employer]. I received confirmation that the fax was received

## 2020-04-17 NOTE — Telephone Encounter (Signed)
Pt spoke with Cassandra Burns at DME office who stated they had paperwork and that form was going to be sent to office for SG to fill out with correct O2 info.  PCCs, please advise if a form has been received from DME that is needing to be filled out by Cassandra Burns in order for pt to get POC.

## 2020-04-20 ENCOUNTER — Telehealth: Payer: Self-pay | Admitting: Pulmonary Disease

## 2020-04-20 NOTE — Telephone Encounter (Signed)
LMTCB

## 2020-04-21 NOTE — Telephone Encounter (Signed)
Called and spoke with patient.  She let me know that she has an inogen and wants to hook it up to her BiPAP but its pulsed oxygen. She also had a concern that using her inogen while walking from car to house her oxygen levels were low, when asked she states they are 77% on 2L pulsed. I asked her if she had been in our office and walked on pulsed oxygen she states yes.  I instructed the patient that she would have to go back to what she was using before the inogen and we would get her in for  Walk with her inogen to see what the problem was. Patient then stated she has 30 days to return and get her deposit back but needs a doctors notes. So I told her we would get her in with SG as requested to evaluate and walk her on the pulsed oxygen while here and see what her needs are. Patient states her Inogen only goes to 3L pulsed.   Appointment made for 04/26/20 with SG Nothing further needed at this time.

## 2020-04-25 ENCOUNTER — Ambulatory Visit (HOSPITAL_COMMUNITY): Payer: Medicare HMO | Attending: Cardiology

## 2020-04-25 ENCOUNTER — Other Ambulatory Visit: Payer: Self-pay

## 2020-04-25 DIAGNOSIS — R0902 Hypoxemia: Secondary | ICD-10-CM | POA: Insufficient documentation

## 2020-04-25 DIAGNOSIS — R0600 Dyspnea, unspecified: Secondary | ICD-10-CM | POA: Diagnosis not present

## 2020-04-25 LAB — ECHOCARDIOGRAM COMPLETE
Area-P 1/2: 3.53 cm2
S' Lateral: 2.7 cm

## 2020-04-25 MED ORDER — PERFLUTREN LIPID MICROSPHERE
1.0000 mL | INTRAVENOUS | Status: AC | PRN
  Administered 2020-04-25: 2 mL via INTRAVENOUS

## 2020-04-26 ENCOUNTER — Encounter: Payer: Self-pay | Admitting: Acute Care

## 2020-04-26 ENCOUNTER — Ambulatory Visit (INDEPENDENT_AMBULATORY_CARE_PROVIDER_SITE_OTHER): Payer: Medicare HMO | Admitting: Acute Care

## 2020-04-26 VITALS — BP 132/82 | HR 98 | Temp 97.4°F | Ht 59.0 in | Wt 215.2 lb

## 2020-04-26 DIAGNOSIS — G4733 Obstructive sleep apnea (adult) (pediatric): Secondary | ICD-10-CM

## 2020-04-26 DIAGNOSIS — J45909 Unspecified asthma, uncomplicated: Secondary | ICD-10-CM | POA: Diagnosis not present

## 2020-04-26 DIAGNOSIS — E669 Obesity, unspecified: Secondary | ICD-10-CM | POA: Diagnosis not present

## 2020-04-26 DIAGNOSIS — D869 Sarcoidosis, unspecified: Secondary | ICD-10-CM | POA: Diagnosis not present

## 2020-04-26 NOTE — Patient Instructions (Addendum)
It is good to se you today.  We will refer you to healthy weight and wellness.  Lets start with weight loss. Please continue using Symbicort 2 puffs twice daily Rinse mouth after use Continue using Albuterol as needed for breakthrough shortness of breath or wheezing.  You need to wear the continuous flow oxygen. The Inogen pulsed oxygen does not meet your oxygen needs.  Continue using your continuous flow oxygen at 2 L with rest and 3 L with exertion. Saturation goals are 88% and higher. If you drop below 88%, increase oxygen until sats respond, and then decrease to baseline.  Follow up in 3 months with Judson Roch NP or Dr. Halford Chessman  to see if you have been able to lose weight.  Please consider getting the Covid vaccine. Please contact office for sooner follow up if symptoms do not improve or worsen or seek emergency care

## 2020-04-26 NOTE — Progress Notes (Signed)
History of Present Illness Cassandra E Taliaferrois a 54 y.o.femaleformer smoker ( But does smoke weed occasionally)withpulmonarysarcoidosis, asthma, morbid obesity hypoventilation syndromeand OSA. She is treated with BiPAP. She is followed by Dr. Halford Chessman. She has been off prednisone since 12/2018. She has had progression of sarcoid 12/2019, and now requires continuous flow  oxygen at 2 L [place].   04/26/2020  Pt. Presents for follow up. She is here to see if she can come off her oxygen. She was walked today on RA, and her sats to 79% on RA. We have re-iterated with her that she needs to wear oxygen. She states that she is determined to come off her oxygen. She understands that she has sarcoid, but she is determined to lose weight so that she can come off her oxygen.  She dropped to 81% on 3 L pulsed oxygen through the Imogen device. Her oxygen needs exceed what the inogen ( G4) unit can supply through pulsed oxygen. Therefore all oxyen needs must be through continuous oxygen at 2 L at rest and 3 L with exertion.  No asthma flared Continued to have shortness of breath with exertion and with carrying packages.  She is using her Symbicort 2 puffs twice daily without fail. Also has a hernia which was noted on her  CPST which could be impacting her dyspnea. .    Test Results: Echo 04/25/2020 Left Ventricle: Left ventricular ejection fraction, by estimation, is 55  to 60%. The left ventricle has normal function. The left ventricle has no  regional wall motion abnormalities. Definity contrast agent was given IV  to delineate the left ventricular  endocardial borders. The left ventricular internal cavity size was normal  in size. There is moderate left ventricular hypertrophy. Left ventricular  diastolic parameters are consistent with Grade I diastolic dysfunction  (impaired relaxation). Elevated  left atrial pressure.   Right Ventricle: The right ventricular size is normal.Right ventricular   systolic function is normal.   Left Atrium: Left atrial size was normal in size.   Right Atrium: Right atrial size was normal in size.   Pericardium: Trivial pericardial effusion is present.   Mitral Valve: The mitral valve is normal in structure. Mild mitral annular  calcification. No evidence of mitral valve regurgitation. No evidence of  mitral valve stenosis.   Tricuspid Valve: The tricuspid valve is normal in structure. Tricuspid  valve regurgitation is trivial. No evidence of tricuspid stenosis.   Aortic Valve: The aortic valve is tricuspid. Aortic valve regurgitation is  not visualized. No aortic stenosis is present.   Pulmonic Valve: The pulmonic valve was not well visualized. Pulmonic valve  regurgitation is trivial. No evidence of pulmonic stenosis.   Aorta: The aortic root is normal in size and structure.   Venous: The inferior vena cava is normal in size with greater than 50%  respiratory variability, suggesting right atrial pressure of 3 mmHg.   IAS/Shunts: No atrial level shunt detected by color flow Doppler.      CPST 03/07/2020 The interpretation of this test is limited due to submaximal effort during the exercise. Based on available data, exercise testing with gas exchange demonstrates severe functional limitation when compared to matched sedentary norms. Patient appears primarily severely pulmonary limited with severe hypertensive response to low level exercise/ambulation and desaturation without use of supplemental O2. Patient's body habitus is playing a large contributing factor in reduced functional capacity. Should be clinically correlated.    HRCT Chest 01/18/2020 Mediastinal, hilar and pulmonary parenchymal findings of  sarcoid. Pulmonary parenchymal findings have progressed from 04/29/2017. Air trapping is indicative of small airways disease. Aortic atherosclerosis (ICD10-I70.0). Coronary artery Calcification.  BiPAP Down Load  01/09/2020-02/07/2020 Usage days 28 of 30 or 93% Greater than 4 hours 28 days or 93% Less than 4 hours 0 days or 0% Average usage total days used 8 hours 32 minutes Air curve 10 auto V auto IPAP 16 cm H2O EPAP 12 cm H2O AHI 0.1   11/08/2019 PFT        CBC Latest Ref Rng & Units 01/27/2020 06/30/2019 11/07/2017  WBC 4.0 - 10.5 K/uL 6.0 7.3 6.7  Hemoglobin 12.0 - 15.0 g/dL 12.0 10.6(L) 11.8  Hematocrit 36 - 46 % 40.7 37.7 38.6  Platelets 150 - 400 K/uL 263 394 213    BMP Latest Ref Rng & Units 01/27/2020 06/30/2019 04/21/2017  Glucose 70 - 99 mg/dL 390(H) 128(H) 175(H)  BUN 6 - 20 mg/dL 15 21(H) 13  Creatinine 0.44 - 1.00 mg/dL 0.97 0.98 0.82  Sodium 135 - 145 mmol/L 134(L) 133(L) 138  Potassium 3.5 - 5.1 mmol/L 4.6 4.9 4.1  Chloride 98 - 111 mmol/L 104 102 103  CO2 22 - 32 mmol/L 17(L) 21(L) 26  Calcium 8.9 - 10.3 mg/dL 9.5 10.9(H) 10.5    BNP No results found for: BNP  ProBNP    Component Value Date/Time   PROBNP 175.8 (H) 07/10/2014 1320    PFT    Component Value Date/Time   FEV1PRE 0.88 11/08/2019 1011   FEV1POST 0.80 11/08/2019 1011   FVCPRE 1.00 11/08/2019 1011   FVCPOST 0.87 11/08/2019 1011   TLC 2.50 11/08/2019 1011   DLCOUNC 9.95 11/08/2019 1011   PREFEV1FVCRT 89 11/08/2019 1011   PSTFEV1FVCRT 91 11/08/2019 1011    ECHOCARDIOGRAM COMPLETE  Result Date: 04/25/2020    ECHOCARDIOGRAM REPORT   Patient Name:   Cassandra Burns Date of Exam: 04/25/2020 Medical Rec #:  [id number]             Height:       59.0 in Accession #:    [id number]            Weight:       212.4 lb Date of Birth:  02-19-1966            BSA:          1.892 m Patient Age:    20 years              BP:           136/78 mmHg Patient Gender: F                     HR:           95 bpm. Exam Location:  [place] Procedure: 2D Echo, Cardiac Doppler, Color Doppler and Intracardiac            Opacification Agent Indications:    R06.00 Dyspnea  History:        Patient has no prior history of  Echocardiogram examinations.                 Risk Factors:Obesity, Hypertension and Diabetes. Sarcoidosis.                 Sleep apnea.  Sonographer:    Wilford Sports Rodgers-Jones RDCS Referring Phys: [place]  1. Left ventricular ejection fraction, by estimation, is 55 to 60%. The left ventricle has normal function. The left  ventricle has no regional wall motion abnormalities. There is moderate left ventricular hypertrophy. Left ventricular diastolic parameters are consistent with Grade I diastolic dysfunction (impaired relaxation). Elevated left atrial pressure.  2. Right ventricular systolic function is normal. The right ventricular size is normal.  3. The mitral valve is normal in structure. No evidence of mitral valve regurgitation. No evidence of mitral stenosis.  4. The aortic valve is tricuspid. Aortic valve regurgitation is not visualized. No aortic stenosis is present.  5. The inferior vena cava is normal in size with greater than 50% respiratory variability, suggesting right atrial pressure of 3 mmHg. FINDINGS  Left Ventricle: Left ventricular ejection fraction, by estimation, is 55 to 60%. The left ventricle has normal function. The left ventricle has no regional wall motion abnormalities. Definity contrast agent was given IV to delineate the left ventricular  endocardial borders. The left ventricular internal cavity size was normal in size. There is moderate left ventricular hypertrophy. Left ventricular diastolic parameters are consistent with Grade I diastolic dysfunction (impaired relaxation). Elevated left atrial pressure. Right Ventricle: The right ventricular size is normal.Right ventricular systolic function is normal. Left Atrium: Left atrial size was normal in size. Right Atrium: Right atrial size was normal in size. Pericardium: Trivial pericardial effusion is present. Mitral Valve: The mitral valve is normal in structure. Mild mitral annular calcification. No evidence of  mitral valve regurgitation. No evidence of mitral valve stenosis. Tricuspid Valve: The tricuspid valve is normal in structure. Tricuspid valve regurgitation is trivial. No evidence of tricuspid stenosis. Aortic Valve: The aortic valve is tricuspid. Aortic valve regurgitation is not visualized. No aortic stenosis is present. Pulmonic Valve: The pulmonic valve was not well visualized. Pulmonic valve regurgitation is trivial. No evidence of pulmonic stenosis. Aorta: The aortic root is normal in size and structure. Venous: The inferior vena cava is normal in size with greater than 50% respiratory variability, suggesting right atrial pressure of 3 mmHg. IAS/Shunts: No atrial level shunt detected by color flow Doppler.  LEFT VENTRICLE PLAX 2D LVIDd:         3.90 cm  Diastology LVIDs:         2.70 cm  LV e' medial:    5.22 cm/s LV PW:         1.60 cm  LV E/e' medial:  17.2 LV IVS:        1.60 cm  LV e' lateral:   6.85 cm/s LVOT diam:     2.20 cm  LV E/e' lateral: 13.1 LV SV:         76 LV SV Index:   40 LVOT Area:     3.80 cm  RIGHT VENTRICLE             IVC RV Basal diam:  3.80 cm     IVC diam: 1.10 cm RV S prime:     14.40 cm/s TAPSE (M-mode): 2.3 cm LEFT ATRIUM             Index       RIGHT ATRIUM           Index LA diam:        4.50 cm 2.38 cm/m  RA Area:     14.30 cm LA Vol (A2C):   35.3 ml 18.66 ml/m RA Volume:   36.50 ml  19.29 ml/m LA Vol (A4C):   43.4 ml 22.94 ml/m LA Biplane Vol: 41.2 ml 21.77 ml/m  AORTIC VALVE LVOT Vmax:   94.80 cm/s LVOT Vmean:  68.700 cm/s LVOT VTI:    0.200 m  AORTA Ao Root diam: 3.10 cm Ao Asc diam:  3.50 cm MITRAL VALVE MV Area (PHT): 3.53 cm     SHUNTS MV Decel Time: 215 msec     Systemic VTI:  0.20 m MV E velocity: 89.60 cm/s   Systemic Diam: 2.20 cm MV A velocity: 131.00 cm/s MV E/A ratio:  0.68 Kirk Ruths MD Electronically signed by Kirk Ruths MD Signature Date/Time: 04/25/2020/1:56:18 PM    Final      Past medical hx Past Medical History:  Diagnosis Date  .  Anxiety   . Asthma   . Bone pain 06/07/2013  . Depression   . 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 Hernia of abdominal cavity 10/25/2013  . Hypertension   . Iron deficiency anemia due to chronic blood loss   . Lymphadenopathy 06/07/2013  . Menometrorrhagia   . Obesity 12/18/2012  . Psoriasis   . Sarcoidosis 07/26/2013  . Sleep apnea   . Unspecified deficiency anemia 07/26/2013     Social History   Tobacco Use  . Smoking status: Current Some Day Smoker    Packs/day: 0.25    Years: 2.00    Pack years: 0.50    Types: Cigars    Last attempt to quit: 06/30/2017    Years since quitting: 2.8  . Smokeless tobacco: Never Used  . Tobacco comment: Smokes Marijuana (once daily) (as needed)  Vaping Use  . Vaping Use: Never used  Substance Use Topics  . Alcohol use: Yes    Alcohol/week: 0.0 standard drinks    Comment: all monthly  . Drug use: No    Frequency: 7.0 times per week    Types: Marijuana    Comment: Marijuana--once a week    Ms.Daughtry reports that she has been smoking cigars. She has a 0.50 pack-year smoking history. She has never used smokeless tobacco. She reports current alcohol use. She reports that she does not use drugs.  Tobacco Cessation: Current Some Day smoker  Past surgical hx, Family hx, Social hx all reviewed.  Current Outpatient Medications on File Prior to Visit  Medication Sig  . acetaminophen (TYLENOL) 500 MG tablet Take 500 mg by mouth every 6 (six) hours as needed for mild pain.  [name] albuterol (PROVENTIL) (2.5 MG/3ML) 0.083% nebulizer solution Take 3 mLs (2.5 mg total) by nebulization every 6 (six) hours as needed for wheezing or shortness of breath.  [name] albuterol (VENTOLIN HFA) 108 (90 Base) MCG/ACT inhaler Inhale 1-2 puffs into the lungs every 6 (six) hours as needed for wheezing or shortness of breath.  [name] amLODipine (NORVASC) 10 MG tablet Take 1 tablet (10 mg total) by mouth daily.  . budesonide-formoterol (SYMBICORT) 160-4.5 MCG/ACT inhaler  Inhale 2 puffs into the lungs 2 (two) times daily.  . fluticasone (FLONASE) 50 MCG/ACT nasal spray SMARTSIG:2 Spray(s) Both Nares Every Evening  . gabapentin (NEURONTIN) 400 MG capsule Take 400 mg by mouth every 6 (six) hours.  [name] labetalol (NORMODYNE) 100 MG tablet Take 100 mg by mouth 2 (two) times daily.  [name] losartan-hydrochlorothiazide (HYZAAR) 100-12.5 MG tablet Take 1 tablet by mouth daily.   . metFORMIN (GLUCOPHAGE) 1000 MG tablet Take 1,000 mg by mouth daily with breakfast.   . predniSONE (DELTASONE) 10 MG tablet Take 4 tabs x 2 days, then 3 x 2d, then 2 x 2d, then 1 x 2d and STOP  . predniSONE (DELTASONE) 10 MG tablet Take 4 tabs for 2 days, then  3 tabs for 2 days, 2 tabs for 2 days, then 1 tab for 2 days, then stop.  Nelva Nay SOLOSTAR 300 UNIT/ML SOPN Inject 40-50 Units into the skin 2 (two) times daily. Take 50 units in the morning and Take 40 units in the evening.  . TRULICITY 4.31 VQ/0.0QQ SOPN Inject 0.75 mg into the skin once a week.   . Vitamin D, Ergocalciferol, (DRISDOL) 1.25 MG (50000 UT) CAPS capsule Take 50,000 Units by mouth every 7 (seven) days. Take on Sunday.   No current facility-administered medications on file prior to visit.     No Known Allergies  Review Of Systems:  Constitutional:   No  weight loss, night sweats,  Fevers, chills, fatigue, or  lassitude.  HEENT:   No headaches,  Difficulty swallowing,  Tooth/dental problems, or  Sore throat,                No sneezing, itching, ear ache, nasal congestion, post nasal drip,   CV:  No chest pain,  Orthopnea, PND, swelling in lower extremities, anasarca, dizziness, palpitations, syncope.   GI  No heartburn, indigestion, abdominal pain, nausea, vomiting, diarrhea, change in bowel habits, loss of appetite, bloody stools.   Resp: + shortness of breath with exertion less at rest.  No excess mucus, no productive cough,  No non-productive cough,  No coughing up of blood.  No change in color of mucus.  + wheezing with  exertion.  No chest wall deformity  Skin: no rash or lesions.  GU: no dysuria, change in color of urine, no urgency or frequency.  No flank pain, no hematuria   MS:  No joint pain or swelling.  No decreased range of motion.  No back pain.  Psych:  No change in mood or affect. No depression or anxiety.  No memory loss.   Vital Signs BP 132/82 (BP Location: Left Arm, Cuff Size: Normal)   Pulse 98   Temp (!) 97.4 F (36.3 C) (Oral)   Ht 4\' 11"  (1.499 m)   Wt 215 lb 3.2 oz (97.6 kg)   LMP 06/13/2012   SpO2 96%   BMI 43.47 kg/m    Physical Exam:  General- No distress,  A&Ox3, pleasant ENT: No sinus tenderness, TM clear, pale nasal mucosa, no oral exudate,no post nasal drip, no LAN Cardiac: S1, S2, regular rate and rhythm, no murmur Chest: No wheeze/ rales/ dullness; no accessory muscle use, no nasal flaring, no sternal retractions Abd.: Soft Non-tender, ND, BS +. .Body mass index is 43.47 kg/m. Ext: No clubbing cyanosis, edema Neuro:  normal strength, MAE x 4, A&O x 3 Skin: No rashes,No lesions,  warm and dry Psych: normal mood and behavior   Assessment/Plan Pulmonary Sarcoidosis Currently off prednisone Now oxygen dependent Plan You need to wear the continuous flow oxygen. The Inogen pulsed oxygen does not meet your oxygen needs.  Continue using your continuous flow oxygen at 2 L with rest and 3 L with exertion. Saturation goals are 88% and higher. Consider Methotrexate as option for treatment as you want to avoid prednisone  ACE level this week  We will call you with results If you drop your sats  below 88%, increase oxygen until sats respond, and then decrease to baseline.  Follow up in 3 months with Judson Roch NP or Dr. Halford Chessman  to see if you have been able to lose weight.   OSA Plan Continue onBiPAP at bedtime. You appear to be benefiting from the treatment  Bleed oxygen  at 2 L [place] into the device at bedtime.  Goal is to wear for at least 6 hours each night for maximal  clinical benefit. Continue to work on weight loss, as the link between excess weight  and sleep apnea is well established.   Remember to establish a good bedtime routine, and work on sleep hygiene.  Limit daytime naps , avoid stimulants such as caffeine and nicotine close to bedtime, exercise daily to promote sleep quality, avoid heavy , spicy, fried , or rich foods before bed. Ensure adequate exposure to natural light during the day,establish a relaxing bedtime routine with a pleasant sleep environment ( Bedroom between 60 and 67 degrees, turn off bright lights , TV or device screens screens , consider black out curtains or white noise machines) Do not drive if sleepy. Remember to clean mask, tubing, filter, and reservoir once weekly with soapy water.  Follow up in  3 months with Dr. Halford Chessman or Judson Roch NP  or before as needed.    Asthma Stable interval Plan Please continue using Symbicort 2 puffs twice daily Rinse mouth after use Continue using Albuterol as needed for breakthrough shortness of breath or wheezing.    Obesity Plan Referral to [facility] Work on losing weight through diet Increase activity as able  Health Maintenance Plan Please consider getting the Covid Vaccine as you are high risk and will not do well if you are infected.   Magdalen Spatz, NP 04/26/2020  3:12 PM

## 2020-04-27 ENCOUNTER — Other Ambulatory Visit: Payer: Self-pay | Admitting: Acute Care

## 2020-04-27 ENCOUNTER — Telehealth: Payer: Self-pay | Admitting: Acute Care

## 2020-04-27 NOTE — Telephone Encounter (Signed)
-----   Message from Magdalen Spatz, NP sent at 04/26/2020  6:10 PM EDT ----- Regarding: Needs an ACE level Please have patient come to either this office or the [place] office to get an ACE level done. Please tell her I am sorry I didn't have her do this while she was here. Thanks so much

## 2020-04-27 NOTE — Telephone Encounter (Signed)
I called and spoke with patient regarding bloodwork. She will come to the office tomorrow to get it done. Nothing further needed.

## 2020-04-27 NOTE — Progress Notes (Signed)
Reviewed and agree with assessment/plan.   Chesley Mires, MD [facility] Pulmonary/Critical Care 04/27/2020, 9:04 AM Pager:  [phone]

## 2020-05-15 ENCOUNTER — Ambulatory Visit: Payer: Medicare HMO | Admitting: Dietician

## 2020-06-08 ENCOUNTER — Ambulatory Visit: Payer: Medicare HMO | Admitting: Dietician

## 2020-07-17 ENCOUNTER — Ambulatory Visit: Payer: Medicare HMO | Admitting: Dietician

## 2020-09-07 ENCOUNTER — Other Ambulatory Visit: Payer: Self-pay | Admitting: Internal Medicine

## 2020-09-08 LAB — BASIC METABOLIC PANEL WITH GFR
BUN: 17 mg/dL (ref 7–25)
CO2: 26 mmol/L (ref 20–32)
Calcium: 9.9 mg/dL (ref 8.6–10.4)
Chloride: 103 mmol/L (ref 98–110)
Creat: 0.96 mg/dL (ref 0.50–1.05)
GFR, Est African American: 77 mL/min/{1.73_m2} (ref >=60)
GFR, Est Non African American: 67 mL/min/{1.73_m2} (ref >=60)
Glucose, Bld: 181 mg/dL — ABNORMAL HIGH (ref 65–99)
Potassium: 4.7 mmol/L (ref 3.5–5.3)
Sodium: 138 mmol/L (ref 135–146)

## 2020-09-08 LAB — EXTRA LAV TOP TUBE

## 2020-09-08 LAB — MAGNESIUM: Magnesium: 1.9 mg/dL (ref 1.5–2.5)

## 2020-09-26 ENCOUNTER — Ambulatory Visit (HOSPITAL_COMMUNITY)
Admission: EM | Admit: 2020-09-26 | Discharge: 2020-09-26 | Disposition: A | Discharge status: Home / Self Care | Payer: Medicare HMO | Attending: Emergency Medicine | Admitting: Emergency Medicine

## 2020-09-26 ENCOUNTER — Encounter (HOSPITAL_COMMUNITY): Payer: Self-pay

## 2020-09-26 ENCOUNTER — Other Ambulatory Visit: Payer: Self-pay

## 2020-09-26 DIAGNOSIS — M5442 Lumbago with sciatica, left side: Secondary | ICD-10-CM | POA: Diagnosis not present

## 2020-09-26 DIAGNOSIS — M65312 Trigger thumb, left thumb: Secondary | ICD-10-CM

## 2020-09-26 DIAGNOSIS — M79645 Pain in left finger(s): Secondary | ICD-10-CM

## 2020-09-26 DIAGNOSIS — M79605 Pain in left leg: Secondary | ICD-10-CM | POA: Diagnosis not present

## 2020-09-26 LAB — POCT URINALYSIS DIPSTICK, ED / UC
Bilirubin Urine: NEGATIVE
Glucose, UA: NEGATIVE mg/dL
Ketones, ur: NEGATIVE mg/dL
Leukocytes,Ua: NEGATIVE
Nitrite: NEGATIVE
Protein, ur: 30 mg/dL — AB
Specific Gravity, Urine: 1.02 (ref 1.005–1.030)
Urobilinogen, UA: 0.2 mg/dL (ref 0.0–1.0)
pH: 6 (ref 5.0–8.0)

## 2020-09-26 MED ORDER — CYCLOBENZAPRINE HCL 10 MG PO TABS
10.0000 mg | ORAL_TABLET | Freq: Every day | ORAL | 0 refills | Status: DC
Start: 2020-09-26 — End: 2021-04-27

## 2020-09-26 MED ORDER — IBUPROFEN 800 MG PO TABS
800.0000 mg | ORAL_TABLET | Freq: Three times a day (TID) | ORAL | 0 refills | Status: DC
Start: 2020-09-26 — End: 2021-09-30

## 2020-09-26 NOTE — Discharge Instructions (Addendum)
Take muscle relaxer at bedtime, be mindful that this medication can make you drowsy  Can take ibuprofen three times a day as needed for additional comfort   Can use heating pad up to four times a day as needed   Follow up with orthopedics, if pain persist or symptoms worsen

## 2020-09-26 NOTE — ED Provider Notes (Signed)
[place]    CSN: [id number] Arrival date & time: 09/26/20  0801      History   Chief Complaint Chief Complaint  Patient presents with  . Leg Pain  . Hand Pain    Thumb    HPI VEVERLY LARIMER is a 55 y.o. female.   Patient  Presents with left sided abdominal pain 8/10 beginning 3 weeks ago. Describes as sharp radiating into left leg and buttocks causing a cramping sensation. Denies new numbness and tingling. Has history of neuropathy. Denies changes in urinary and bowel habits. Denies fever and chills. Has history of abdominal hernia. Recently treated for unknown bacterial infection. Did not complete medication due to side effects.   C/o of left finger stiffening and popping sound  Occurring spontaneously  throughout the day beginning 1.5 weeks ago, causes pain, decreases ROM.   Past Medical History:  Diagnosis Date  . Anxiety   . Asthma   . Bone pain 06/07/2013  . Depression   . 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 Hernia of abdominal cavity 10/25/2013  . Hypertension   . Iron deficiency anemia due to chronic blood loss   . Lymphadenopathy 06/07/2013  . Menometrorrhagia   . Obesity 12/18/2012  . Psoriasis   . Sarcoidosis 07/26/2013  . Sleep apnea   . Unspecified deficiency anemia 07/26/2013    Patient Active Problem List   Diagnosis Date Noted  . Tobacco abuse 06/30/2017  . Asthma 05/19/2017  . Cough   . Hypertensive emergency 07/11/2014  . HTN (hypertension)   . Pulmonary edema   . SOB (shortness of breath)   . Hypertensive urgency 07/10/2014  . Rectal bleeding 04/14/2014  . Diastasis recti 11/04/2013  . Hernia of abdominal cavity 10/25/2013  . Sarcoidosis 07/26/2013  . Unspecified deficiency anemia 07/26/2013  . Bone pain 06/07/2013  . Lymphadenopathy 06/07/2013  . Obesity hypoventilation syndrome 04/06/2013  . Depression, major, recurrent, moderate ([place]) 01/19/2013  . Generalized anxiety disorder 01/19/2013  . OSA (obstructive  sleep apnea) 11/19/2012  . Mediastinal lymphadenopathy 11/03/2012  . DOE (dyspnea on exertion) 11/03/2012  . DM (diabetes mellitus) ([place]) 11/07/2011  . Morbid obesity ([place]) 11/07/2011  . Menometrorrhagia   . Iron deficiency anemia due to chronic blood loss   . Essential hypertension     Past Surgical History:  Procedure Laterality Date  . TUBAL LIGATION      OB History   No obstetric history on file.      Home Medications    Prior to Admission medications   Medication Sig Start Date End Date Taking? Authorizing Provider  gabapentin (NEURONTIN) 400 MG capsule Take 400 mg by mouth every 6 (six) hours. 05/26/19  Yes [provider]  losartan-hydrochlorothiazide (HYZAAR) 100-12.5 MG tablet Take 1 tablet by mouth daily.  05/14/19  Yes [provider]  metFORMIN (GLUCOPHAGE) 1000 MG tablet Take 1,000 mg by mouth daily with breakfast.    Yes [provider]  acetaminophen (TYLENOL) 500 MG tablet Take 500 mg by mouth every 6 (six) hours as needed for mild pain.    [provider]  albuterol (PROVENTIL) (2.5 MG/3ML) 0.083% nebulizer solution Take 3 mLs (2.5 mg total) by nebulization every 6 (six) hours as needed for wheezing or shortness of breath. 05/19/17   Magdalen Spatz, NP  albuterol (VENTOLIN HFA) 108 (90 Base) MCG/ACT inhaler Inhale 1-2 puffs into the lungs every 6 (six) hours as needed for wheezing or shortness of breath. 11/09/19   Volanda Napoleon,  Ree Shay, NP  amLODipine (NORVASC) 10 MG tablet Take 1 tablet (10 mg total) by mouth daily. 07/13/14   Donita Brooks, NP  budesonide-formoterol (SYMBICORT) 160-4.5 MCG/ACT inhaler Inhale 2 puffs into the lungs 2 (two) times daily. 09/29/17   Magdalen Spatz, NP  fluticasone Asencion Islam) 50 MCG/ACT nasal spray SMARTSIG:2 Spray(s) Both Nares Every Evening 10/13/19   [provider]  labetalol (NORMODYNE) 100 MG tablet Take 100 mg by mouth 2 (two) times daily. 10/27/19   [provider]  predniSONE  (DELTASONE) 10 MG tablet Take 4 tabs x 2 days, then 3 x 2d, then 2 x 2d, then 1 x 2d and STOP 10/25/19   Magdalen Spatz, NP  predniSONE (DELTASONE) 10 MG tablet Take 4 tabs for 2 days, then 3 tabs for 2 days, 2 tabs for 2 days, then 1 tab for 2 days, then stop. 01/10/20   Magdalen Spatz, NP  TOUJEO SOLOSTAR 300 UNIT/ML SOPN Inject 40-50 Units into the skin 2 (two) times daily. Take 50 units in the morning and Take 40 units in the evening. 02/09/19   [provider]  TRULICITY 7.78 EU/2.3NT SOPN Inject 0.75 mg into the skin once a week.  06/14/19   [provider]  Vitamin D, Ergocalciferol, (DRISDOL) 1.25 MG (50000 UT) CAPS capsule Take 50,000 Units by mouth every 7 (seven) days. Take on Sunday.    [provider]    Family History Family History  Problem Relation Age of Onset  . Diabetes type II Father   . Asthma Father   . Hypertension Father   . Heart murmur Father   . Alcohol abuse Father   . Drug abuse Father   . Hypertension Mother   . Alcohol abuse Mother   . Drug abuse Mother     Social History Social History   Tobacco Use  . Smoking status: Current Some Day Smoker    Packs/day: 0.25    Years: 2.00    Pack years: 0.50    Types: Cigars    Last attempt to quit: 06/30/2017    Years since quitting: 3.2  . Smokeless tobacco: Never Used  . Tobacco comment: Smokes Marijuana (once daily) (as needed)  Vaping Use  . Vaping Use: Never used  Substance Use Topics  . Alcohol use: Yes    Alcohol/week: 0.0 standard drinks    Comment: all monthly  . Drug use: No    Frequency: 7.0 times per week    Types: Marijuana    Comment: Marijuana--once a week     Allergies   Patient has no known allergies.   Review of Systems Review of Systems  Constitutional: Negative.   HENT: Negative.   Respiratory: Negative.   Cardiovascular: Negative.   Gastrointestinal: Negative.   Genitourinary: Negative.   Musculoskeletal: Positive for back pain. Negative for  arthralgias, gait problem, joint swelling, myalgias, neck pain and neck stiffness.  Skin: Negative.   Neurological: Negative.   Psychiatric/Behavioral: Negative.      Physical Exam Triage Vital Signs ED Triage Vitals  Enc Vitals Group     BP 09/26/20 0819 (!) 173/100     Pulse Rate 09/26/20 0819 87     Resp 09/26/20 0819 18     Temp 09/26/20 0819 97.9 F (36.6 C)     Temp Source 09/26/20 0819 Oral     SpO2 09/26/20 0819 91 %     Weight --      Height --  Head Circumference --      Peak Flow --      Pain Score 09/26/20 0817 7     Pain Loc --      Pain Edu? --      Excl. in [place]? --    No data found.  Updated Vital Signs BP (!) 173/100 (BP Location: Right Arm)   Pulse 87   Temp 97.9 F (36.6 C) (Oral)   Resp 18   LMP 06/13/2012   SpO2 91%   Visual Acuity Right Eye Distance:   Left Eye Distance:   Bilateral Distance:    Right Eye Near:   Left Eye Near:    Bilateral Near:     Physical Exam Constitutional:      Appearance: Normal appearance. She is obese.  HENT:     Head: Normocephalic.  Pulmonary:     Effort: Pulmonary effort is normal.  Abdominal:     General: Abdomen is flat. Bowel sounds are normal.     Palpations: Abdomen is soft.     Tenderness: There is no right CVA tenderness or left CVA tenderness.     Hernia: A hernia is present.  Musculoskeletal:     Cervical back: Normal.     Thoracic back: Normal.     Lumbar back: No edema or signs of trauma. Normal range of motion.       Back:     Comments: Tenderness over lateral latissimus dorsi, guarding and tearful during exam, no swelling, ROM intact   Right thumb locked during flexion, able to complete ROM. Non tender, no edema   Skin:    General: Skin is warm and dry.  Neurological:     General: No focal deficit present.     Mental Status: She is alert and oriented to person, place, and time. Mental status is at baseline.  Psychiatric:        Mood and Affect: Mood normal.        Behavior:  Behavior normal.        Thought Content: Thought content normal.        Judgment: Judgment normal.      UC Treatments / Results  Labs (all labs ordered are listed, but only abnormal results are displayed) Labs Reviewed  POCT URINALYSIS DIPSTICK, ED / UC    EKG   Radiology No results found.  Procedures Procedures (including critical care time)  Medications Ordered in UC Medications - No data to display  Initial Impression / Assessment and Plan / UC Course  I have reviewed the triage vital signs and the nursing notes.  Pertinent labs & imaging results that were available during my care of the patient were reviewed by me and considered in my medical decision making (see chart for details).  Acute left lower back pain with sciatica  Trigger finger of left thumb  1. Ibuprofen 800 mg tid  2. Flexeril 10 mg at bedtime as needed 3. Heating pad 15 minute intervals for additional comfort  4. Requesting splint for thumb, educated not medically necessary but can be used at night for additional comfort  Final Clinical Impressions(s) / UC Diagnoses   Final diagnoses:  None   Discharge Instructions   None    ED Prescriptions    None     PDMP not reviewed this encounter.   Hans [place], [place] 09/26/20 [phone]

## 2020-09-26 NOTE — ED Notes (Signed)
Reported chief complaint and O2 level to [place], [place].

## 2020-09-26 NOTE — ED Triage Notes (Signed)
Pt presents with pain to the LLQ pain X 3 weeks. She states she felt her left leg tighten up and states her thumb cramps up. Pt denies chest pain.

## 2020-11-21 ENCOUNTER — Telehealth: Payer: Self-pay

## 2020-11-21 NOTE — Telephone Encounter (Signed)
Pharmacy called to verify pts phone number

## 2020-11-28 ENCOUNTER — Other Ambulatory Visit: Payer: Self-pay | Admitting: Internal Medicine

## 2020-11-28 DIAGNOSIS — Z1231 Encounter for screening mammogram for malignant neoplasm of breast: Secondary | ICD-10-CM

## 2020-12-13 ENCOUNTER — Other Ambulatory Visit: Payer: Self-pay | Admitting: Internal Medicine

## 2020-12-15 LAB — URINE CULTURE
MICRO NUMBER:: 11906084
SPECIMEN QUALITY:: ADEQUATE

## 2021-01-02 ENCOUNTER — Other Ambulatory Visit (HOSPITAL_COMMUNITY)
Admission: RE | Admit: 2021-01-02 | Discharge: 2021-01-02 | Disposition: A | Discharge status: Home / Self Care | Payer: Medicare HMO | Source: Ambulatory Visit | Attending: Obstetrics & Gynecology | Admitting: Obstetrics & Gynecology

## 2021-01-02 ENCOUNTER — Ambulatory Visit (INDEPENDENT_AMBULATORY_CARE_PROVIDER_SITE_OTHER): Payer: Medicare HMO | Admitting: Obstetrics & Gynecology

## 2021-01-02 ENCOUNTER — Encounter: Payer: Self-pay | Admitting: Obstetrics & Gynecology

## 2021-01-02 ENCOUNTER — Other Ambulatory Visit: Payer: Self-pay

## 2021-01-02 VITALS — BP 162/88 | HR 96 | Ht 59.0 in | Wt 213.0 lb

## 2021-01-02 DIAGNOSIS — Z113 Encounter for screening for infections with a predominantly sexual mode of transmission: Secondary | ICD-10-CM | POA: Diagnosis present

## 2021-01-02 DIAGNOSIS — Z01411 Encounter for gynecological examination (general) (routine) with abnormal findings: Secondary | ICD-10-CM | POA: Diagnosis not present

## 2021-01-02 DIAGNOSIS — Z01419 Encounter for gynecological examination (general) (routine) without abnormal findings: Secondary | ICD-10-CM | POA: Insufficient documentation

## 2021-01-02 DIAGNOSIS — F1290 Cannabis use, unspecified, uncomplicated: Secondary | ICD-10-CM

## 2021-01-02 DIAGNOSIS — L989 Disorder of the skin and subcutaneous tissue, unspecified: Secondary | ICD-10-CM | POA: Diagnosis not present

## 2021-01-02 DIAGNOSIS — Z780 Asymptomatic menopausal state: Secondary | ICD-10-CM

## 2021-01-02 DIAGNOSIS — Z124 Encounter for screening for malignant neoplasm of cervix: Secondary | ICD-10-CM | POA: Diagnosis not present

## 2021-01-02 DIAGNOSIS — Z1151 Encounter for screening for human papillomavirus (HPV): Secondary | ICD-10-CM | POA: Diagnosis not present

## 2021-01-02 NOTE — Progress Notes (Signed)
55 y.o. G2P2 Single Black or [place] American female here for referral from PCP, Dr. Jeanie Cooks, to establish gyn care.  Is PMP and has not been on HRT.  Does still have hot flashes.  Denies vaginal bleeding.  Pt is desirous of STD testing.  Review of chart does not show a pap since 2013.  Pt thinks she's had one sooner than that but unsure.  Denies abnormal discharge.  Hx significant for diabetes, obesity, sarcoid.  Pt is adamant she does not smoke cigarettes and never has.  Admits to regular marijuana use.  Patient's last menstrual period was 06/13/2012.          Sexually active: Yes.    The current method of family planning is none and post menopausal status.   Smoker:  Yes, marijuana  Health Maintenance: Pap:  Obtained today History of abnormal Pap:  H/o HR HPV  MMG:  Scheduled for 6/24 Colonoscopy:  Scheduled with [facility], h/o bright red rectal bleeding at times TDaP:  11/2010 Pneumonia vaccine(s):  2012   reports that she has never smoked. She has never used smokeless tobacco. She reports current alcohol use. She reports that she does not use drugs.  Past Medical History:  Diagnosis Date  . Anxiety   . Asthma   . Bone pain 06/07/2013  . Depression   . 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 Hernia of abdominal cavity 10/25/2013  . History of anemia 07/26/2013  . Hypertension   . Iron deficiency anemia due to chronic blood loss   . Lymphadenopathy 06/07/2013  . Obesity 12/18/2012  . Psoriasis   . Sarcoidosis 07/26/2013  . Sleep apnea    uses bipap    Past Surgical History:  Procedure Laterality Date  . TUBAL LIGATION      Current Outpatient Medications  Medication Sig Dispense Refill  . acetaminophen (TYLENOL) 500 MG tablet Take 500 mg by mouth every 6 (six) hours as needed for mild pain.    [name] albuterol (PROVENTIL) (2.5 MG/3ML) 0.083% nebulizer solution Take 3 mLs (2.5 mg total) by nebulization every 6 (six) hours as needed for wheezing or shortness of breath. 75 mL 12  .  albuterol (VENTOLIN HFA) 108 (90 Base) MCG/ACT inhaler Inhale 1-2 puffs into the lungs every 6 (six) hours as needed for wheezing or shortness of breath. 18 g 6  . amLODipine (NORVASC) 10 MG tablet Take 1 tablet (10 mg total) by mouth daily. 30 tablet 1  . budesonide-formoterol (SYMBICORT) 160-4.5 MCG/ACT inhaler Inhale 2 puffs into the lungs 2 (two) times daily. 1 Inhaler 6  . cyclobenzaprine (FLEXERIL) 10 MG tablet Take 1 tablet (10 mg total) by mouth at bedtime. 10 tablet 0  . fluticasone (FLONASE) 50 MCG/ACT nasal spray SMARTSIG:2 Spray(s) Both Nares Every Evening    . labetalol (NORMODYNE) 100 MG tablet Take 100 mg by mouth 2 (two) times daily.    [name] losartan-hydrochlorothiazide (HYZAAR) 100-12.5 MG tablet Take 1 tablet by mouth daily.     . metFORMIN (GLUCOPHAGE) 1000 MG tablet Take 1,000 mg by mouth daily with breakfast.     . TOUJEO SOLOSTAR 300 UNIT/ML SOPN Inject 40-50 Units into the skin 2 (two) times daily. Take 50 units in the morning and Take 40 units in the evening.    . TRULICITY 9.35 TS/1.7BL SOPN Inject 0.75 mg into the skin once a week.     . gabapentin (NEURONTIN) 400 MG capsule Take 400 mg by mouth every 6 (six) hours.    [name]  gabapentin (NEURONTIN) 800 MG tablet Take 800 mg by mouth 3 (three) times daily.    [name] ibuprofen (ADVIL) 800 MG tablet Take 1 tablet (800 mg total) by mouth 3 (three) times daily. 21 tablet 0  . predniSONE (DELTASONE) 10 MG tablet Take 4 tabs x 2 days, then 3 x 2d, then 2 x 2d, then 1 x 2d and STOP (Patient not taking: Reported on 01/02/2021) 20 tablet 0  . predniSONE (DELTASONE) 10 MG tablet Take 4 tabs for 2 days, then 3 tabs for 2 days, 2 tabs for 2 days, then 1 tab for 2 days, then stop. (Patient not taking: Reported on 01/02/2021) 20 tablet 0  . Vitamin D, Ergocalciferol, (DRISDOL) 1.25 MG (50000 UT) CAPS capsule Take 50,000 Units by mouth every 7 (seven) days. Take on Sunday. (Patient not taking: Reported on 01/02/2021)     No current facility-administered  medications for this visit.    Family History  Problem Relation Age of Onset  . Diabetes type II Father   . Asthma Father   . Hypertension Father   . Heart murmur Father   . Alcohol abuse Father   . Drug abuse Father   . Hypertension Mother   . Alcohol abuse Mother   . Drug abuse Mother     Review of Systems  All other systems reviewed and are negative.   Exam:   BP (!) 162/88   Pulse 96   Ht 4\' 11"  (1.499 m)   Wt 213 lb (96.6 kg)   LMP 06/13/2012   BMI 43.02 kg/m   Height: 4\' 11"  (149.9 cm)  General appearance: alert, cooperative and appears stated age Head: Normocephalic, without obvious abnormality, atraumatic Neck: no adenopathy, supple, symmetrical, trachea midline and thyroid normal to inspection and palpation Lungs: clear to auscultation bilaterally Breasts: normal appearance, no masses or tenderness Heart: regular rate and rhythm Abdomen: soft, non-tender; bowel sounds normal; no masses,  no organomegaly Extremities: extremities normal, atraumatic, no cyanosis or edema Skin: Skin color, texture, turgor normal. No rashes or lesions Lymph nodes: Cervical, supraclavicular, and axillary nodes normal. No abnormal inguinal nodes palpated Neurologic: Grossly normal   Pelvic: External genitalia:  Patchy hypopigmentation with pink underlying skin present, area on mid inner right labia majora is thicker appearing than the other lesions              Urethra:  normal appearing urethra with no masses, tenderness or lesions              Bartholins and Skenes: normal                 Vagina: normal appearing vagina with normal color and no discharge, no lesions              Cervix: no lesions              Pap taken: Yes.   Bimanual Exam:  Uterus:  normal size, contour, position, consistency, mobility, non-tender              Adnexa: normal adnexa and no mass, fullness, tenderness               Rectovaginal: Confirms               Anus:  normal sphincter tone, no  lesions  Chaperone, Ariel Burch, CMA, was present for exam.  Assessment/Plan: 1. Postmenopausal - no HRT - chronic nature of hot flashes discussed with pt  2. Abnormal skin of vulva -  recommend vulvar biopsy.  Will have her return for this.  3. Cervical cancer screening - Cytology - PAP( [place])  4. Screening examination for STD (sexually transmitted disease) - Cervicovaginal ancillary only( [place]) - HIV Antibody (routine testing w rflx) - Hepatitis C antibody - Hepatitis B surface antigen - RPR w/reflex to TrepSure  5. Marijuana use

## 2021-01-03 DIAGNOSIS — F1290 Cannabis use, unspecified, uncomplicated: Secondary | ICD-10-CM | POA: Insufficient documentation

## 2021-01-03 LAB — RPR W/REFLEX TO TREPSURE: RPR: NONREACTIVE

## 2021-01-03 LAB — HEPATITIS C ANTIBODY: Hep C Virus Ab: <0.1 s/co ratio (ref 0.0–0.9)

## 2021-01-03 LAB — HIV ANTIBODY (ROUTINE TESTING W REFLEX): HIV Screen 4th Generation wRfx: NONREACTIVE

## 2021-01-03 LAB — T PALLIDUM ANTIBODY, EIA: T pallidum Antibody, EIA: POSITIVE — AB

## 2021-01-03 LAB — HEPATITIS B SURFACE ANTIGEN: Hepatitis B Surface Ag: NEGATIVE

## 2021-01-04 LAB — CERVICOVAGINAL ANCILLARY ONLY
Chlamydia: NEGATIVE
Comment: NEGATIVE
Comment: NEGATIVE
Comment: NORMAL
Neisseria Gonorrhea: NEGATIVE
Trichomonas: NEGATIVE

## 2021-01-05 LAB — CYTOLOGY - PAP
Adequacy: ABSENT
Comment: NEGATIVE
Diagnosis: NEGATIVE
High risk HPV: NEGATIVE

## 2021-01-09 IMAGING — CT CT CHEST HIGH RESOLUTION W/O CM
2 of 7 series · 14 of 36 positions shown, 17 images · non-contrast
Comparison: 04/29/2017.

CLINICAL DATA: Sarcoid, increased shortness of breath with exertion
for 2-3 weeks. On prednisone for 1 week.

EXAM:
CT CHEST WITHOUT CONTRAST
TECHNIQUE: Multidetector CT imaging of the chest was performed following the
standard protocol without intravenous contrast. High resolution
imaging of the lungs, as well as inspiratory and expiratory imaging,
was performed.

[Series 5: high resolution · axial · 0.75mm/px · z∈[-194,+62]mm · 11 of 308 slices shown, 14 images]
[im 26/308  mediastinal]
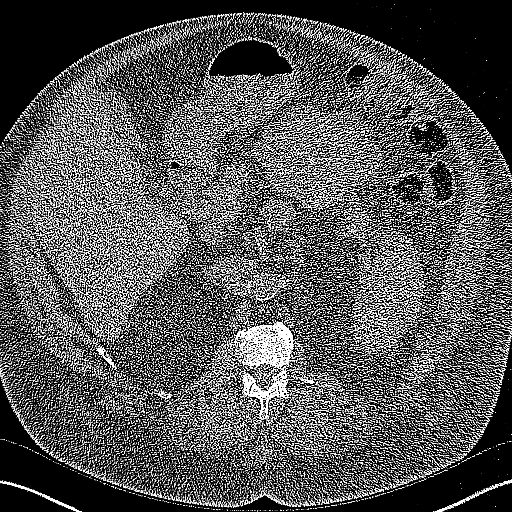
[im 26/308  lung]
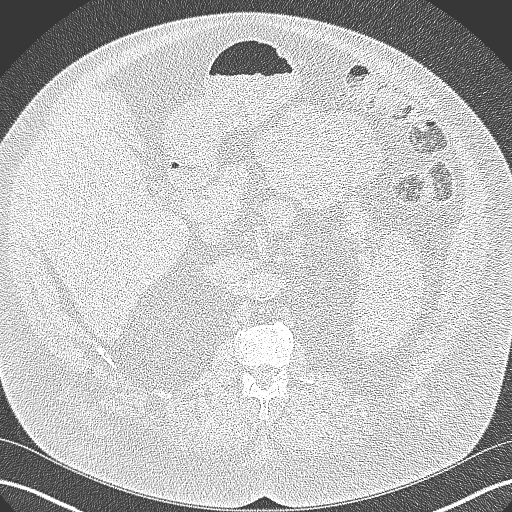
[im 52/308  lung]
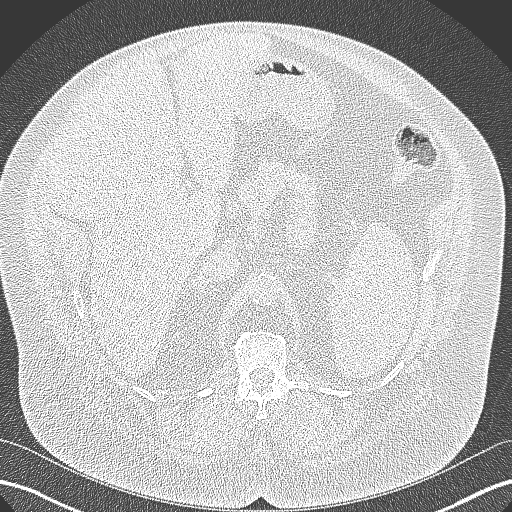
[im 77/308  lung]
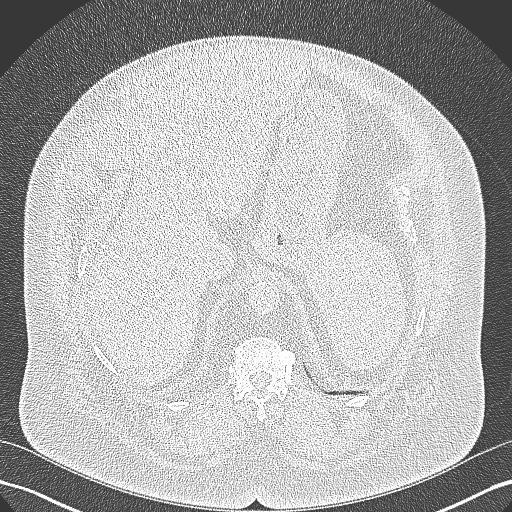
[im 103/308  lung]
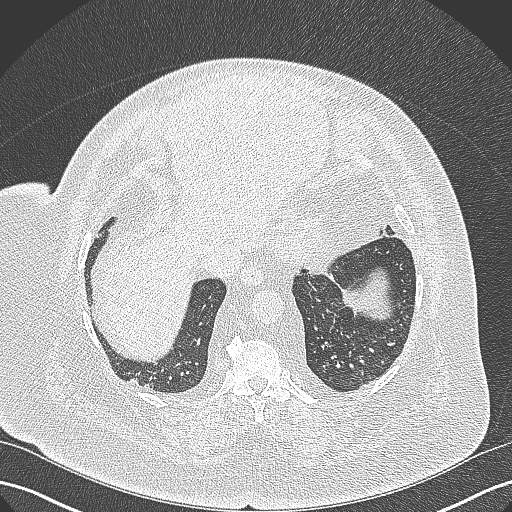
[im 128/308  mediastinal]
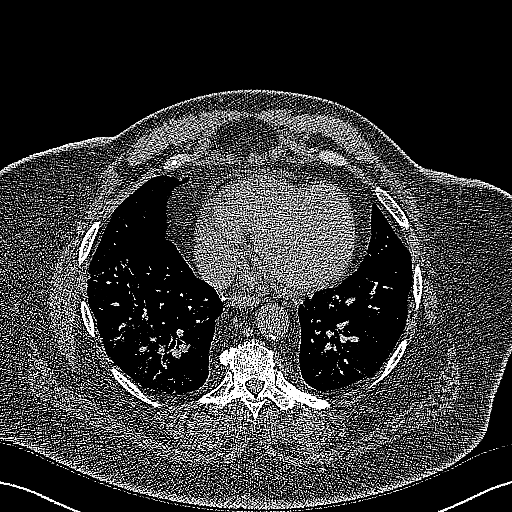
[im 128/308  lung]
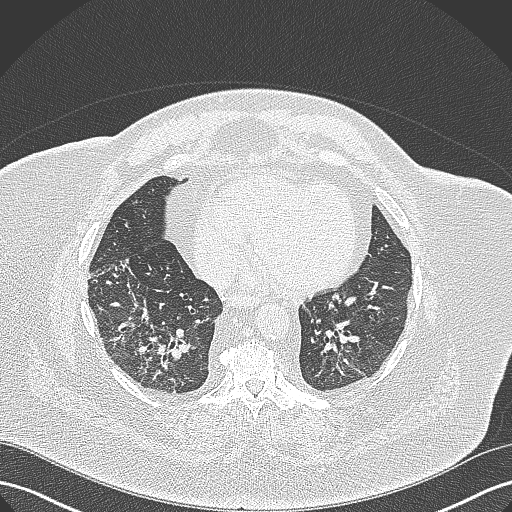
[im 154/308  lung]
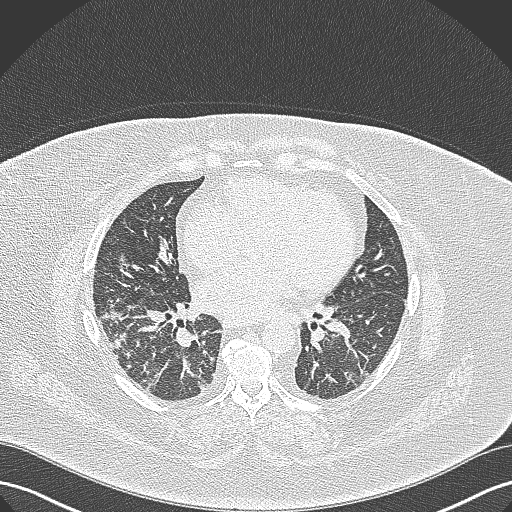
[im 180/308  lung]
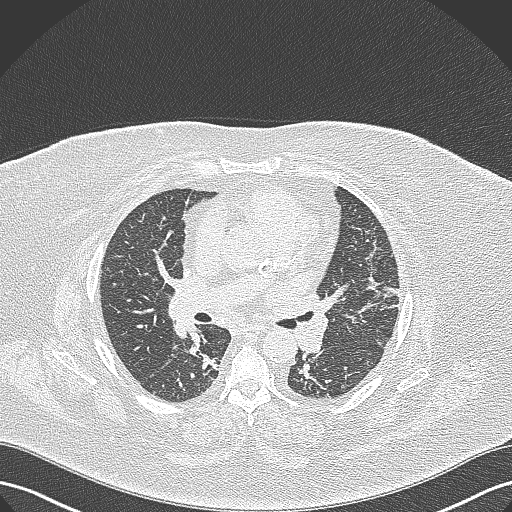
[im 205/308  lung]
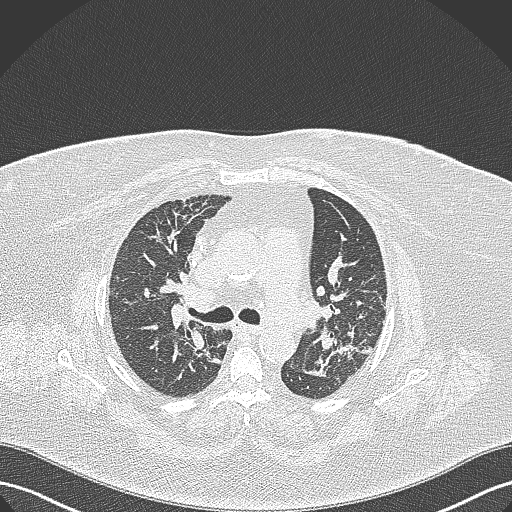
[im 231/308  mediastinal]
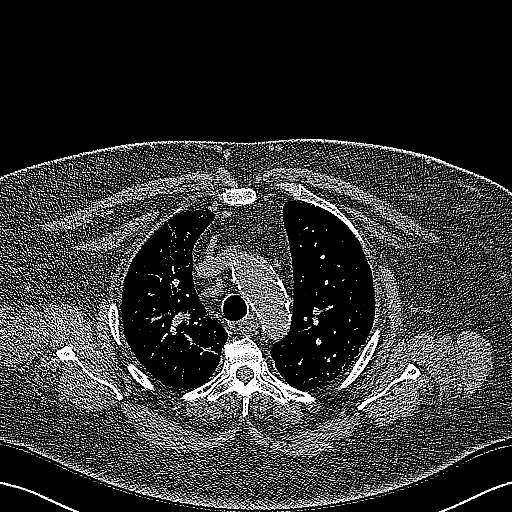
[im 231/308  lung]
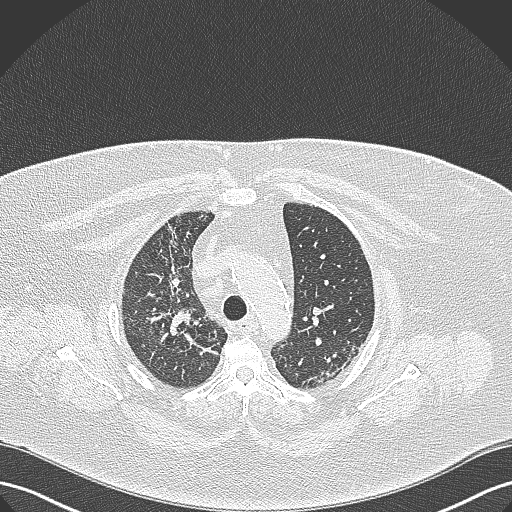
[im 256/308  lung]
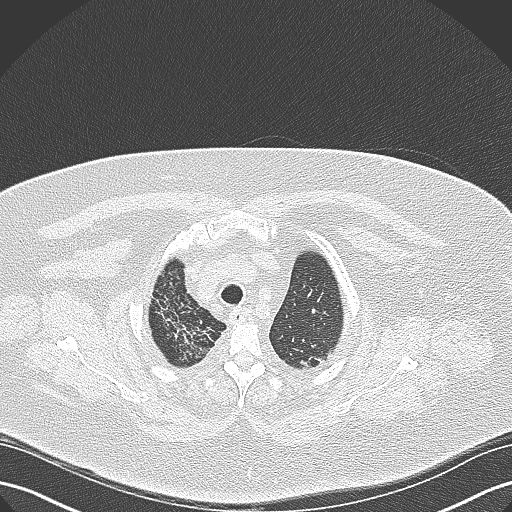
[im 282/308  lung]
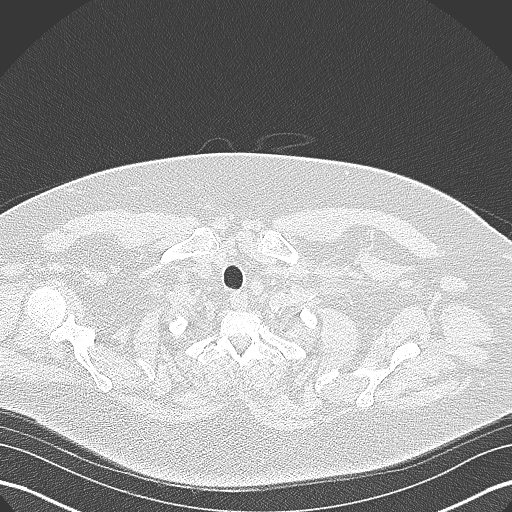

[Series 8: coronal · coronal · 0.63mm/px · 3 of 132 slices shown]
[im 27/132  lung]
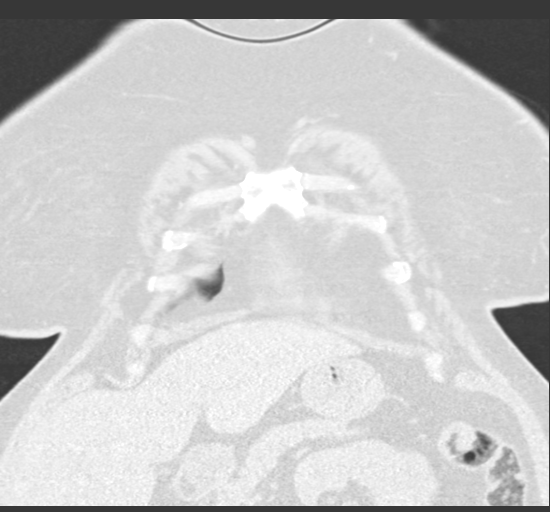
[im 53/132  lung]
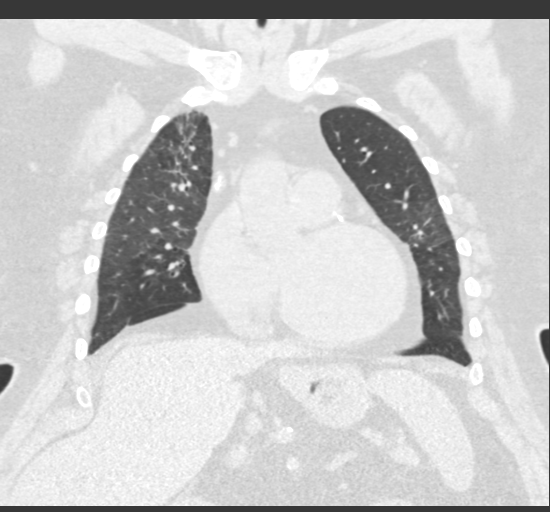
[im 79/132  lung]
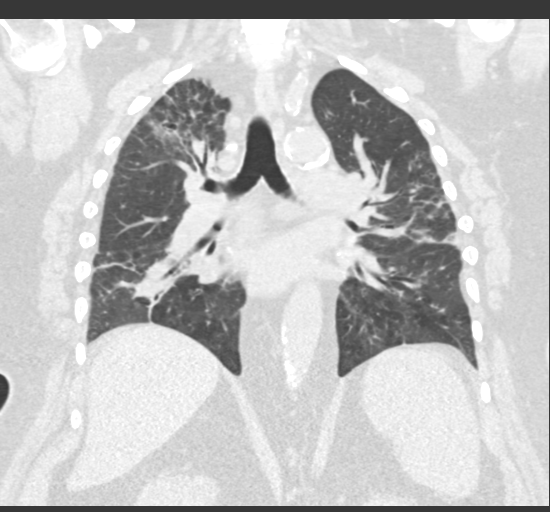

[14 of 36 positions shown; findings below may reference images not displayed]

FINDINGS: Cardiovascular: Atherosclerotic calcification of the aorta and
coronary arteries. Heart is mildly enlarged. No pericardial
effusion.

Mediastinum/Nodes: Calcified and noncalcified mediastinal and hilar
lymph nodes, as before. No axillary adenopathy. Esophagus is
unremarkable.

Lungs/Pleura: There is patchy traction bronchiectasis with
interstitial coarsening, perilymphatic nodularity and architectural
distortion, upper and midlung zone predominant. Findings appear
progressive from 04/29/2017. No pleural fluid. Airway is
unremarkable. There is air trapping.

Upper Abdomen: Visualized portions of the liver, gallbladder and
right adrenal gland are unremarkable. Nodular thickening of the left
adrenal gland. Visualized portions of the kidneys, spleen, pancreas,
stomach and bowel are grossly unremarkable. Upper abdominal lymph
nodes measure up to 1.7 cm in the periportal region, unchanged.

Musculoskeletal: Degenerative changes in the spine. No worrisome
lytic or sclerotic lesions.
IMPRESSION: 1. Mediastinal, hilar and pulmonary parenchymal findings of sarcoid.
Pulmonary parenchymal findings have progressed from 04/29/2017.
2. Air trapping is indicative of small airways disease.
3. Aortic atherosclerosis ([id number]). Coronary artery
calcification.

## 2021-01-19 ENCOUNTER — Ambulatory Visit: Payer: Medicare HMO

## 2021-01-25 ENCOUNTER — Other Ambulatory Visit: Payer: Self-pay

## 2021-01-25 ENCOUNTER — Ambulatory Visit
Admission: RE | Admit: 2021-01-25 | Discharge: 2021-01-25 | Disposition: A | Discharge status: Home / Self Care | Payer: Medicare HMO | Source: Ambulatory Visit | Attending: Internal Medicine | Admitting: Internal Medicine

## 2021-01-25 DIAGNOSIS — Z1231 Encounter for screening mammogram for malignant neoplasm of breast: Secondary | ICD-10-CM

## 2021-02-20 ENCOUNTER — Other Ambulatory Visit: Payer: Self-pay | Admitting: Internal Medicine

## 2021-02-22 LAB — URINE CULTURE
MICRO NUMBER:: 12164032
SPECIMEN QUALITY:: ADEQUATE

## 2021-03-23 ENCOUNTER — Other Ambulatory Visit: Payer: Self-pay | Admitting: Internal Medicine

## 2021-03-24 LAB — CBC
HCT: 37.4 % (ref 35.0–45.0)
Hemoglobin: 10.9 g/dL — ABNORMAL LOW (ref 11.7–15.5)
MCH: 20.2 pg — ABNORMAL LOW (ref 27.0–33.0)
MCHC: 29.1 g/dL — ABNORMAL LOW (ref 32.0–36.0)
MCV: 69.4 fL — ABNORMAL LOW (ref 80.0–100.0)
MPV: 10.3 fL (ref 7.5–12.5)
Platelets: 281 10*3/uL (ref 140–400)
RBC: 5.39 10*6/uL — ABNORMAL HIGH (ref 3.80–5.10)
RDW: 20 % — ABNORMAL HIGH (ref 11.0–15.0)
WBC: 5.9 10*3/uL (ref 3.8–10.8)

## 2021-03-24 LAB — COMPLETE METABOLIC PANEL WITH GFR
AG Ratio: 1.1 (calc) (ref 1.0–2.5)
ALT: 15 U/L (ref 6–29)
AST: 15 U/L (ref 10–35)
Albumin: 4.1 g/dL (ref 3.6–5.1)
Alkaline phosphatase (APISO): 92 U/L (ref 37–153)
BUN/Creatinine Ratio: 18 (calc) (ref 6–22)
BUN: 19 mg/dL (ref 7–25)
CO2: 24 mmol/L (ref 20–32)
Calcium: 10 mg/dL (ref 8.6–10.4)
Chloride: 104 mmol/L (ref 98–110)
Creat: 1.06 mg/dL — ABNORMAL HIGH (ref 0.50–1.03)
Globulin: 3.7 g/dL (calc) (ref 1.9–3.7)
Glucose, Bld: 61 mg/dL — ABNORMAL LOW (ref 65–99)
Potassium: 4 mmol/L (ref 3.5–5.3)
Sodium: 140 mmol/L (ref 135–146)
Total Bilirubin: 0.4 mg/dL (ref 0.2–1.2)
Total Protein: 7.8 g/dL (ref 6.1–8.1)
eGFR: 62 mL/min/{1.73_m2} (ref >=60)

## 2021-03-24 LAB — LIPID PANEL
Cholesterol: 258 mg/dL — ABNORMAL HIGH (ref <200)
HDL: 39 mg/dL — ABNORMAL LOW (ref >=50)
LDL Cholesterol (Calc): 182 mg/dL (calc) — ABNORMAL HIGH
Non-HDL Cholesterol (Calc): 219 mg/dL (calc) — ABNORMAL HIGH (ref <130)
Total CHOL/HDL Ratio: 6.6 (calc) — ABNORMAL HIGH (ref <5.0)
Triglycerides: 206 mg/dL — ABNORMAL HIGH (ref <150)

## 2021-03-24 LAB — TSH: TSH: 1.79 mIU/L

## 2021-03-24 LAB — URIC ACID: Uric Acid, Serum: 7.7 mg/dL — ABNORMAL HIGH (ref 2.5–7.0)

## 2021-03-24 LAB — VITAMIN D 25 HYDROXY (VIT D DEFICIENCY, FRACTURES): Vit D, 25-Hydroxy: 18 ng/mL — ABNORMAL LOW (ref 30–100)

## 2021-04-23 ENCOUNTER — Inpatient Hospital Stay (HOSPITAL_COMMUNITY)
Admission: EM | Admit: 2021-04-23 | Discharge: 2021-04-27 | DRG: 202 | Disposition: A | Discharge status: Home / Self Care | Payer: Medicare HMO | Attending: Family Medicine | Admitting: Family Medicine

## 2021-04-23 ENCOUNTER — Encounter (HOSPITAL_COMMUNITY): Payer: Self-pay

## 2021-04-23 ENCOUNTER — Other Ambulatory Visit: Payer: Self-pay

## 2021-04-23 ENCOUNTER — Emergency Department (HOSPITAL_COMMUNITY): Payer: Medicare HMO

## 2021-04-23 DIAGNOSIS — J810 Acute pulmonary edema: Secondary | ICD-10-CM

## 2021-04-23 DIAGNOSIS — D869 Sarcoidosis, unspecified: Secondary | ICD-10-CM | POA: Diagnosis present

## 2021-04-23 DIAGNOSIS — L409 Psoriasis, unspecified: Secondary | ICD-10-CM | POA: Diagnosis present

## 2021-04-23 DIAGNOSIS — I161 Hypertensive emergency: Secondary | ICD-10-CM | POA: Diagnosis present

## 2021-04-23 DIAGNOSIS — J9621 Acute and chronic respiratory failure with hypoxia: Secondary | ICD-10-CM | POA: Diagnosis present

## 2021-04-23 DIAGNOSIS — G4733 Obstructive sleep apnea (adult) (pediatric): Secondary | ICD-10-CM | POA: Diagnosis present

## 2021-04-23 DIAGNOSIS — F419 Anxiety disorder, unspecified: Secondary | ICD-10-CM | POA: Diagnosis present

## 2021-04-23 DIAGNOSIS — Z20822 Contact with and (suspected) exposure to covid-19: Secondary | ICD-10-CM | POA: Diagnosis present

## 2021-04-23 DIAGNOSIS — Z9851 Tubal ligation status: Secondary | ICD-10-CM | POA: Diagnosis not present

## 2021-04-23 DIAGNOSIS — I110 Hypertensive heart disease with heart failure: Secondary | ICD-10-CM | POA: Diagnosis present

## 2021-04-23 DIAGNOSIS — I5031 Acute diastolic (congestive) heart failure: Secondary | ICD-10-CM | POA: Diagnosis not present

## 2021-04-23 DIAGNOSIS — R058 Other specified cough: Secondary | ICD-10-CM

## 2021-04-23 DIAGNOSIS — J449 Chronic obstructive pulmonary disease, unspecified: Secondary | ICD-10-CM | POA: Diagnosis present

## 2021-04-23 DIAGNOSIS — J9611 Chronic respiratory failure with hypoxia: Secondary | ICD-10-CM | POA: Diagnosis present

## 2021-04-23 DIAGNOSIS — Z825 Family history of asthma and other chronic lower respiratory diseases: Secondary | ICD-10-CM

## 2021-04-23 DIAGNOSIS — D509 Iron deficiency anemia, unspecified: Secondary | ICD-10-CM | POA: Diagnosis present

## 2021-04-23 DIAGNOSIS — Z7984 Long term (current) use of oral hypoglycemic drugs: Secondary | ICD-10-CM

## 2021-04-23 DIAGNOSIS — E875 Hyperkalemia: Secondary | ICD-10-CM | POA: Diagnosis present

## 2021-04-23 DIAGNOSIS — J45901 Unspecified asthma with (acute) exacerbation: Principal | ICD-10-CM | POA: Diagnosis present

## 2021-04-23 DIAGNOSIS — I248 Other forms of acute ischemic heart disease: Secondary | ICD-10-CM | POA: Diagnosis present

## 2021-04-23 DIAGNOSIS — R350 Frequency of micturition: Secondary | ICD-10-CM | POA: Diagnosis present

## 2021-04-23 DIAGNOSIS — J811 Chronic pulmonary edema: Secondary | ICD-10-CM

## 2021-04-23 DIAGNOSIS — R778 Other specified abnormalities of plasma proteins: Secondary | ICD-10-CM

## 2021-04-23 DIAGNOSIS — I10 Essential (primary) hypertension: Principal | ICD-10-CM | POA: Diagnosis present

## 2021-04-23 DIAGNOSIS — Z79899 Other long term (current) drug therapy: Secondary | ICD-10-CM

## 2021-04-23 DIAGNOSIS — I5033 Acute on chronic diastolic (congestive) heart failure: Secondary | ICD-10-CM | POA: Diagnosis present

## 2021-04-23 DIAGNOSIS — Z833 Family history of diabetes mellitus: Secondary | ICD-10-CM | POA: Diagnosis not present

## 2021-04-23 DIAGNOSIS — Z794 Long term (current) use of insulin: Secondary | ICD-10-CM

## 2021-04-23 DIAGNOSIS — Z7951 Long term (current) use of inhaled steroids: Secondary | ICD-10-CM

## 2021-04-23 DIAGNOSIS — J4541 Moderate persistent asthma with (acute) exacerbation: Secondary | ICD-10-CM

## 2021-04-23 DIAGNOSIS — F32A Depression, unspecified: Secondary | ICD-10-CM | POA: Diagnosis present

## 2021-04-23 DIAGNOSIS — Z6841 Body Mass Index (BMI) 40.0 and over, adult: Secondary | ICD-10-CM

## 2021-04-23 DIAGNOSIS — T380X5A Adverse effect of glucocorticoids and synthetic analogues, initial encounter: Secondary | ICD-10-CM | POA: Diagnosis present

## 2021-04-23 DIAGNOSIS — N179 Acute kidney failure, unspecified: Secondary | ICD-10-CM | POA: Diagnosis present

## 2021-04-23 DIAGNOSIS — E119 Type 2 diabetes mellitus without complications: Secondary | ICD-10-CM

## 2021-04-23 DIAGNOSIS — Z8249 Family history of ischemic heart disease and other diseases of the circulatory system: Secondary | ICD-10-CM

## 2021-04-23 DIAGNOSIS — E1165 Type 2 diabetes mellitus with hyperglycemia: Secondary | ICD-10-CM | POA: Diagnosis present

## 2021-04-23 LAB — GLUCOSE, CAPILLARY
Glucose-Capillary: 254 mg/dL — ABNORMAL HIGH (ref 70–99)
Glucose-Capillary: 411 mg/dL — ABNORMAL HIGH (ref 70–99)
Glucose-Capillary: 415 mg/dL — ABNORMAL HIGH (ref 70–99)

## 2021-04-23 LAB — CBC
HCT: 41.3 % (ref 36.0–46.0)
Hemoglobin: 12 g/dL (ref 12.0–15.0)
MCH: 20.4 pg — ABNORMAL LOW (ref 26.0–34.0)
MCHC: 29.1 g/dL — ABNORMAL LOW (ref 30.0–36.0)
MCV: 70.4 fL — ABNORMAL LOW (ref 80.0–100.0)
Platelets: 262 10*3/uL (ref 150–400)
RBC: 5.87 MIL/uL — ABNORMAL HIGH (ref 3.87–5.11)
RDW: 22.1 % — ABNORMAL HIGH (ref 11.5–15.5)
WBC: 8.3 10*3/uL (ref 4.0–10.5)
nRBC: 0 % (ref 0.0–0.2)

## 2021-04-23 LAB — RESP PANEL BY RT-PCR (FLU A&B, COVID) ARPGX2
Influenza A by PCR: NEGATIVE
Influenza B by PCR: NEGATIVE
SARS Coronavirus 2 by RT PCR: NEGATIVE

## 2021-04-23 LAB — BASIC METABOLIC PANEL
Anion gap: 9 (ref 5–15)
BUN: 17 mg/dL (ref 6–20)
CO2: 29 mmol/L (ref 22–32)
Calcium: 9.7 mg/dL (ref 8.9–10.3)
Chloride: 102 mmol/L (ref 98–111)
Creatinine, Ser: 0.92 mg/dL (ref 0.44–1.00)
GFR, Estimated: >60 mL/min (ref >60)
Glucose, Bld: 153 mg/dL — ABNORMAL HIGH (ref 70–99)
Potassium: 4.7 mmol/L (ref 3.5–5.1)
Sodium: 140 mmol/L (ref 135–145)

## 2021-04-23 LAB — TROPONIN I (HIGH SENSITIVITY)
Troponin I (High Sensitivity): 47 ng/L — ABNORMAL HIGH (ref <18)
Troponin I (High Sensitivity): 39 ng/L — ABNORMAL HIGH (ref <18)

## 2021-04-23 LAB — BRAIN NATRIURETIC PEPTIDE: B Natriuretic Peptide: 94.2 pg/mL (ref 0.0–100.0)

## 2021-04-23 LAB — MRSA NEXT GEN BY PCR, NASAL: MRSA by PCR Next Gen: NOT DETECTED

## 2021-04-23 LAB — I-STAT BETA HCG BLOOD, ED (MC, WL, AP ONLY): I-stat hCG, quantitative: <5 m[IU]/mL (ref <5)

## 2021-04-23 MED ORDER — OXYCODONE HCL 5 MG PO TABS: 5.0000 mg | ORAL_TABLET | ORAL | Status: DC | PRN

## 2021-04-23 MED ORDER — CHLORHEXIDINE GLUCONATE CLOTH 2 % EX PADS
6.0000 | MEDICATED_PAD | Freq: Every day | CUTANEOUS | Status: DC
  Administered 2021-04-23 – 2021-04-25 (×3): 6 via TOPICAL

## 2021-04-23 MED ORDER — HYDRALAZINE HCL 20 MG/ML IJ SOLN: 5.0000 mg | INTRAMUSCULAR | Status: DC | PRN

## 2021-04-23 MED ORDER — IPRATROPIUM-ALBUTEROL 0.5-2.5 (3) MG/3ML IN SOLN
3.0000 mL | Freq: Four times a day (QID) | RESPIRATORY_TRACT | Status: DC
  Administered 2021-04-23 – 2021-04-27 (×14): 3 mL via RESPIRATORY_TRACT
  Filled 2021-04-23 (×14): qty 3

## 2021-04-23 MED ORDER — METHYLPREDNISOLONE SODIUM SUCC 125 MG IJ SOLR
125.0000 mg | Freq: Once | INTRAMUSCULAR | Status: AC
  Administered 2021-04-23: 125 mg via INTRAVENOUS
  Filled 2021-04-23: qty 2

## 2021-04-23 MED ORDER — CYCLOBENZAPRINE HCL 10 MG PO TABS
10.0000 mg | ORAL_TABLET | Freq: Every day | ORAL | Status: DC
  Administered 2021-04-23: 10 mg via ORAL
  Filled 2021-04-23 (×2): qty 1

## 2021-04-23 MED ORDER — ACETAMINOPHEN 325 MG PO TABS: 650.0000 mg | ORAL_TABLET | Freq: Four times a day (QID) | ORAL | Status: DC | PRN

## 2021-04-23 MED ORDER — ENOXAPARIN SODIUM 40 MG/0.4ML IJ SOSY
40.0000 mg | PREFILLED_SYRINGE | INTRAMUSCULAR | Status: DC
  Administered 2021-04-23 – 2021-04-26 (×4): 40 mg via SUBCUTANEOUS
  Filled 2021-04-23 (×4): qty 0.4

## 2021-04-23 MED ORDER — ALBUTEROL SULFATE (2.5 MG/3ML) 0.083% IN NEBU
5.0000 mg | INHALATION_SOLUTION | Freq: Once | RESPIRATORY_TRACT | Status: AC
  Administered 2021-04-23: 5 mg via RESPIRATORY_TRACT
  Filled 2021-04-23: qty 6

## 2021-04-23 MED ORDER — ALBUTEROL SULFATE (2.5 MG/3ML) 0.083% IN NEBU: 2.5000 mg | INHALATION_SOLUTION | RESPIRATORY_TRACT | Status: DC | PRN

## 2021-04-23 MED ORDER — GABAPENTIN 400 MG PO CAPS
800.0000 mg | ORAL_CAPSULE | Freq: Three times a day (TID) | ORAL | Status: DC
  Administered 2021-04-23 – 2021-04-27 (×12): 800 mg via ORAL
  Filled 2021-04-23 (×12): qty 2

## 2021-04-23 MED ORDER — POLYETHYLENE GLYCOL 3350 17 G PO PACK: 17.0000 g | PACK | Freq: Every day | ORAL | Status: DC | PRN

## 2021-04-23 MED ORDER — LABETALOL HCL 100 MG PO TABS
100.0000 mg | ORAL_TABLET | Freq: Once | ORAL | Status: AC
  Administered 2021-04-23: 100 mg via ORAL
  Filled 2021-04-23: qty 1

## 2021-04-23 MED ORDER — FUROSEMIDE 10 MG/ML IJ SOLN
40.0000 mg | Freq: Once | INTRAMUSCULAR | Status: AC
  Administered 2021-04-23: 40 mg via INTRAVENOUS
  Filled 2021-04-23: qty 4

## 2021-04-23 MED ORDER — NITROGLYCERIN IN D5W 200-5 MCG/ML-% IV SOLN
0.0000 ug/min | INTRAVENOUS | Status: DC
Start: 2021-04-23 — End: 2021-04-24
  Administered 2021-04-23: 125 ug/min via INTRAVENOUS
  Administered 2021-04-23: 5 ug/min via INTRAVENOUS
  Filled 2021-04-23 (×2): qty 250

## 2021-04-23 MED ORDER — AMLODIPINE BESYLATE 5 MG PO TABS
10.0000 mg | ORAL_TABLET | Freq: Once | ORAL | Status: AC
  Administered 2021-04-23: 10 mg via ORAL
  Filled 2021-04-23: qty 2

## 2021-04-23 MED ORDER — MORPHINE SULFATE (PF) 2 MG/ML IV SOLN: 2.0000 mg | INTRAVENOUS | Status: DC | PRN

## 2021-04-23 MED ORDER — INSULIN ASPART 100 UNIT/ML IJ SOLN
0.0000 [IU] | Freq: Three times a day (TID) | INTRAMUSCULAR | Status: DC
  Administered 2021-04-23: 8 [IU] via SUBCUTANEOUS

## 2021-04-23 MED ORDER — ACETAMINOPHEN 650 MG RE SUPP: 650.0000 mg | Freq: Four times a day (QID) | RECTAL | Status: DC | PRN

## 2021-04-23 MED ORDER — INSULIN GLARGINE (1 UNIT DIAL) 300 UNIT/ML ~~LOC~~ SOPN: 40.0000 [IU] | PEN_INJECTOR | Freq: Two times a day (BID) | SUBCUTANEOUS | Status: DC

## 2021-04-23 MED ORDER — BISACODYL 5 MG PO TBEC: 5.0000 mg | DELAYED_RELEASE_TABLET | Freq: Every day | ORAL | Status: DC | PRN

## 2021-04-23 MED ORDER — MOMETASONE FURO-FORMOTEROL FUM 200-5 MCG/ACT IN AERO
2.0000 | INHALATION_SPRAY | Freq: Two times a day (BID) | RESPIRATORY_TRACT | Status: DC
  Administered 2021-04-24 – 2021-04-27 (×7): 2 via RESPIRATORY_TRACT
  Filled 2021-04-23: qty 8.8

## 2021-04-23 MED ORDER — INSULIN GLARGINE-YFGN 100 UNIT/ML ~~LOC~~ SOLN
50.0000 [IU] | Freq: Two times a day (BID) | SUBCUTANEOUS | Status: DC
  Administered 2021-04-23 – 2021-04-26 (×6): 50 [IU] via SUBCUTANEOUS
  Filled 2021-04-23 (×7): qty 0.5

## 2021-04-23 MED ORDER — NITROGLYCERIN IN D5W 200-5 MCG/ML-% IV SOLN: 0.0000 ug/min | INTRAVENOUS | Status: DC

## 2021-04-23 MED ORDER — HYDROCHLOROTHIAZIDE 25 MG PO TABS
25.0000 mg | ORAL_TABLET | Freq: Every day | ORAL | Status: DC
  Administered 2021-04-23: 25 mg via ORAL
  Filled 2021-04-23: qty 1

## 2021-04-23 MED ORDER — IPRATROPIUM BROMIDE 0.02 % IN SOLN
0.5000 mg | Freq: Once | RESPIRATORY_TRACT | Status: AC
  Administered 2021-04-23: 0.5 mg via RESPIRATORY_TRACT
  Filled 2021-04-23: qty 2.5

## 2021-04-23 MED ORDER — AMLODIPINE BESYLATE 10 MG PO TABS
10.0000 mg | ORAL_TABLET | Freq: Every day | ORAL | Status: DC
  Administered 2021-04-24 – 2021-04-27 (×4): 10 mg via ORAL
  Filled 2021-04-23 (×4): qty 1

## 2021-04-23 MED ORDER — SODIUM CHLORIDE 0.9% FLUSH
3.0000 mL | Freq: Two times a day (BID) | INTRAVENOUS | Status: DC
  Administered 2021-04-23 – 2021-04-27 (×8): 3 mL via INTRAVENOUS

## 2021-04-23 MED ORDER — NITROGLYCERIN 0.4 MG SL SUBL
0.4000 mg | SUBLINGUAL_TABLET | SUBLINGUAL | Status: DC | PRN
  Administered 2021-04-23: 0.4 mg via SUBLINGUAL
  Filled 2021-04-23: qty 1

## 2021-04-23 MED ORDER — ONDANSETRON HCL 4 MG PO TABS: 4.0000 mg | ORAL_TABLET | Freq: Four times a day (QID) | ORAL | Status: DC | PRN

## 2021-04-23 MED ORDER — LABETALOL HCL 100 MG PO TABS: 100.0000 mg | ORAL_TABLET | Freq: Every day | ORAL | Status: DC

## 2021-04-23 MED ORDER — ONDANSETRON HCL 4 MG/2ML IJ SOLN: 4.0000 mg | Freq: Four times a day (QID) | INTRAMUSCULAR | Status: DC | PRN

## 2021-04-23 MED ORDER — LABETALOL HCL 100 MG PO TABS: 100.0000 mg | ORAL_TABLET | Freq: Two times a day (BID) | ORAL | Status: DC

## 2021-04-23 MED ORDER — LOSARTAN POTASSIUM-HCTZ 100-12.5 MG PO TABS: 1.0000 | ORAL_TABLET | Freq: Every day | ORAL | Status: DC

## 2021-04-23 MED ORDER — ORAL CARE MOUTH RINSE
15.0000 mL | Freq: Two times a day (BID) | OROMUCOSAL | Status: DC
  Administered 2021-04-24 – 2021-04-27 (×6): 15 mL via OROMUCOSAL

## 2021-04-23 MED ORDER — NITROGLYCERIN 2 % TD OINT
1.0000 [in_us] | TOPICAL_OINTMENT | Freq: Once | TRANSDERMAL | Status: DC
  Filled 2021-04-23: qty 30

## 2021-04-23 MED ORDER — LOSARTAN POTASSIUM 50 MG PO TABS
100.0000 mg | ORAL_TABLET | Freq: Every day | ORAL | Status: DC
  Administered 2021-04-23: 100 mg via ORAL
  Filled 2021-04-23: qty 2

## 2021-04-23 MED ORDER — DOCUSATE SODIUM 100 MG PO CAPS
100.0000 mg | ORAL_CAPSULE | Freq: Two times a day (BID) | ORAL | Status: DC
  Administered 2021-04-25 – 2021-04-26 (×2): 100 mg via ORAL
  Filled 2021-04-23 (×7): qty 1

## 2021-04-23 MED ORDER — PREDNISONE 20 MG PO TABS
40.0000 mg | ORAL_TABLET | Freq: Every day | ORAL | Status: DC
  Administered 2021-04-24 – 2021-04-25 (×2): 40 mg via ORAL
  Filled 2021-04-23 (×2): qty 2

## 2021-04-23 MED ORDER — FLUTICASONE PROPIONATE 50 MCG/ACT NA SUSP
2.0000 | Freq: Every day | NASAL | Status: DC
  Administered 2021-04-25 – 2021-04-26 (×2): 2 via NASAL
  Filled 2021-04-23: qty 16

## 2021-04-23 NOTE — ED Notes (Signed)
RT notified of breathing treatment.

## 2021-04-23 NOTE — ED Notes (Signed)
HOspitalist at bedside

## 2021-04-23 NOTE — ED Provider Notes (Signed)
[place] [facility] DEPT Provider Note   CSN: [id number] Arrival date & time: 04/23/21  1043     History Chief Complaint  Patient presents with   Chest Pain   Shortness of Breath   Urinary Frequency   Headache    Cassandra Burns is a 55 y.o. female.  Patient c/o chest pain and sob in past 1-2 days. Symptoms acute onset, moderate, persistent. Hx copd, htn, and osa - uses home o2. Placed self on 3 liters o2 [facility], instead of baseline 2. Also with non prod cough in past week. No sore throat or runny nose. No fevers. No specific known ill contacts or known covid or flu exposure. Has received covid vaccine x 2. Non smoker. Denies leg pain or swelling. Compliant w home meds. Chest pain at rest, constant, dull, mid chest, non radiating, not pleuritic. No exertional cp or discomfort. Denies hx cad. Denies hx dvt or pe.   The history is provided by the patient and medical records.  Chest Pain Associated symptoms: cough, headache and shortness of breath   Associated symptoms: no abdominal pain, no back pain, no fever, no numbness, no palpitations, no vomiting and no weakness   Shortness of Breath Associated symptoms: chest pain, cough, headaches and wheezing   Associated symptoms: no abdominal pain, no fever, no neck pain, no rash, no sore throat and no vomiting   Urinary Frequency Associated symptoms include chest pain, headaches and shortness of breath. Pertinent negatives include no abdominal pain.  Headache Associated symptoms: cough   Associated symptoms: no abdominal pain, no back pain, no diarrhea, no eye pain, no fever, no neck pain, no neck stiffness, no numbness, no sore throat, no vomiting and no weakness       Past Medical History:  Diagnosis Date   Anxiety    Asthma    Bone pain 06/07/2013   Depression    Diabetes mellitus 12/18/2012   NIDDM   Hernia of abdominal cavity 10/25/2013   History of anemia 07/26/2013   Hypertension    Iron deficiency  anemia due to chronic blood loss    Lymphadenopathy 06/07/2013   Obesity 12/18/2012   Psoriasis    Sarcoidosis 07/26/2013   Sleep apnea    uses bipap    Patient Active Problem List   Diagnosis Date Noted   Marijuana use 01/03/2021   Asthma 05/19/2017   HTN (hypertension)    SOB (shortness of breath)    Rectal bleeding 04/14/2014   Diastasis recti 11/04/2013   Hernia of abdominal cavity 10/25/2013   Sarcoidosis 07/26/2013   Unspecified deficiency anemia 07/26/2013   Bone pain 06/07/2013   Lymphadenopathy 06/07/2013   Obesity hypoventilation syndrome 04/06/2013   Depression, major, recurrent, moderate ([place]) 01/19/2013   Generalized anxiety disorder 01/19/2013   OSA (obstructive sleep apnea) 11/19/2012   Mediastinal lymphadenopathy 11/03/2012   DOE (dyspnea on exertion) 11/03/2012   DM (diabetes mellitus) ([place]) 11/07/2011   Morbid obesity ([place]) 11/07/2011   Essential hypertension     Past Surgical History:  Procedure Laterality Date   TUBAL LIGATION       OB History     Gravida  2   Para  2   Term      Preterm      AB      Living         SAB      IAB      Ectopic      Multiple  Live Births              Family History  Problem Relation Age of Onset   Diabetes type II Father    Asthma Father    Hypertension Father    Heart murmur Father    Alcohol abuse Father    Drug abuse Father    Hypertension Mother    Alcohol abuse Mother    Drug abuse Mother     Social History   Tobacco Use   Smoking status: Never   Smokeless tobacco: Never   Tobacco comments:    Smokes Marijuana (once daily) (as needed)  Vaping Use   Vaping Use: Never used  Substance Use Topics   Alcohol use: Yes    Alcohol/week: 0.0 standard drinks    Comment: all monthly   Drug use: No    Frequency: 7.0 times per week    Types: Marijuana    Comment: Marijuana--once a week    Home Medications Prior to Admission medications   Medication Sig Start Date End Date  Taking? Authorizing Provider  acetaminophen (TYLENOL) 500 MG tablet Take 500 mg by mouth every 6 (six) hours as needed for mild pain.    [provider]  albuterol (PROVENTIL) (2.5 MG/3ML) 0.083% nebulizer solution Take 3 mLs (2.5 mg total) by nebulization every 6 (six) hours as needed for wheezing or shortness of breath. 05/19/17   Magdalen Spatz, NP  albuterol (VENTOLIN HFA) 108 (90 Base) MCG/ACT inhaler Inhale 1-2 puffs into the lungs every 6 (six) hours as needed for wheezing or shortness of breath. 11/09/19   Martyn Ehrich, NP  amLODipine (NORVASC) 10 MG tablet Take 1 tablet (10 mg total) by mouth daily. 07/13/14   Donita Brooks, NP  budesonide-formoterol (SYMBICORT) 160-4.5 MCG/ACT inhaler Inhale 2 puffs into the lungs 2 (two) times daily. 09/29/17   Magdalen Spatz, NP  cyclobenzaprine (FLEXERIL) 10 MG tablet Take 1 tablet (10 mg total) by mouth at bedtime. 09/26/20   Hans Eden, NP  fluticasone (FLONASE) 50 MCG/ACT nasal spray SMARTSIG:2 Spray(s) Both Nares Every Evening 10/13/19   [provider]  gabapentin (NEURONTIN) 400 MG capsule Take 400 mg by mouth every 6 (six) hours. 05/26/19   [provider]  gabapentin (NEURONTIN) 800 MG tablet Take 800 mg by mouth 3 (three) times daily. 09/14/20   [provider]  ibuprofen (ADVIL) 800 MG tablet Take 1 tablet (800 mg total) by mouth 3 (three) times daily. 09/26/20   White, Leitha Schuller, NP  labetalol (NORMODYNE) 100 MG tablet Take 100 mg by mouth 2 (two) times daily. 10/27/19   [provider]  losartan-hydrochlorothiazide (HYZAAR) 100-12.5 MG tablet Take 1 tablet by mouth daily.  05/14/19   [provider]  metFORMIN (GLUCOPHAGE) 1000 MG tablet Take 1,000 mg by mouth daily with breakfast.     [provider]  predniSONE (DELTASONE) 10 MG tablet Take 4 tabs x 2 days, then 3 x 2d, then 2 x 2d, then 1 x 2d and STOP Patient not taking: Reported on 01/02/2021 10/25/19   Magdalen Spatz, NP   predniSONE (DELTASONE) 10 MG tablet Take 4 tabs for 2 days, then 3 tabs for 2 days, 2 tabs for 2 days, then 1 tab for 2 days, then stop. Patient not taking: Reported on 01/02/2021 01/10/20   Magdalen Spatz, NP  TOUJEO SOLOSTAR 300 UNIT/ML SOPN Inject 40-50 Units into the skin 2 (two) times daily. Take 50 units in the morning  and Take 40 units in the evening. 02/09/19   [provider]  TRULICITY 5.10 CH/8.5ID SOPN Inject 0.75 mg into the skin once a week.  06/14/19   [provider]  Vitamin D, Ergocalciferol, (DRISDOL) 1.25 MG (50000 UT) CAPS capsule Take 50,000 Units by mouth every 7 (seven) days. Take on Sunday. Patient not taking: Reported on 01/02/2021    [provider]    Allergies    Patient has no known allergies.  Review of Systems   Review of Systems  Constitutional:  Negative for chills and fever.  HENT:  Negative for sore throat.   Eyes:  Negative for pain, redness and visual disturbance.  Respiratory:  Positive for cough, shortness of breath and wheezing.   Cardiovascular:  Positive for chest pain. Negative for palpitations and leg swelling.  Gastrointestinal:  Negative for abdominal pain, diarrhea and vomiting.  Genitourinary:  Positive for frequency. Negative for dysuria and flank pain.  Musculoskeletal:  Negative for back pain, neck pain and neck stiffness.  Skin:  Negative for rash.  Neurological:  Positive for headaches. Negative for speech difficulty, weakness and numbness.  Hematological:  Does not bruise/bleed easily.  Psychiatric/Behavioral:  Negative for confusion.    Physical Exam Updated Vital Signs BP (!) 224/107 (BP Location: Right Arm)   Pulse 94   Temp 98.7 F (37.1 C) (Oral)   Resp (!) 26   Ht 1.499 m (4\' 11" )   Wt 99.8 kg   LMP 06/13/2012   SpO2 97%   BMI 44.43 kg/m   Physical Exam Vitals and nursing note reviewed.  Constitutional:      Appearance: Normal appearance. She is well-developed.  HENT:     Head: Atraumatic.      Comments: No sinus or temporal tenderness.     Nose: Nose normal.     Mouth/Throat:     Mouth: Mucous membranes are moist.  Eyes:     General: No scleral icterus.    Conjunctiva/sclera: Conjunctivae normal.     Pupils: Pupils are equal, round, and reactive to light.  Neck:     Trachea: No tracheal deviation.  Cardiovascular:     Rate and Rhythm: Normal rate and regular rhythm.     Pulses: Normal pulses.     Heart sounds: Normal heart sounds. No murmur heard.   No friction rub. No gallop.  Pulmonary:     Effort: Pulmonary effort is normal. No respiratory distress.     Breath sounds: Wheezing present.     Comments: Non prod cough.  Abdominal:     General: Bowel sounds are normal. There is no distension.     Palpations: Abdomen is soft.     Tenderness: There is no abdominal tenderness.  Genitourinary:    Comments: No cva tenderness.  Musculoskeletal:        General: No swelling or tenderness.     Cervical back: Normal range of motion and neck supple. No rigidity. No muscular tenderness.     Right lower leg: No edema.     Left lower leg: No edema.  Skin:    General: Skin is warm and dry.     Findings: No rash.  Neurological:     Mental Status: She is alert.     Comments: Alert, speech normal. Motor/sens grossly intact bil. Steady gait.   Psychiatric:        Mood and Affect: Mood normal.    ED Results / Procedures / Treatments   Labs (all labs ordered  are listed, but only abnormal results are displayed) Results for orders placed or performed during the hospital encounter of 04/23/21  Resp Panel by RT-PCR (Flu A&B, Covid) Nasopharyngeal Swab   Specimen: Nasopharyngeal Swab; Nasopharyngeal(NP) swabs in vial transport medium  Result Value Ref Range   SARS Coronavirus 2 by RT PCR NEGATIVE NEGATIVE   Influenza A by PCR NEGATIVE NEGATIVE   Influenza B by PCR NEGATIVE NEGATIVE  Basic metabolic panel  Result Value Ref Range   Sodium 140 135 - 145 mmol/L   Potassium 4.7  3.5 - 5.1 mmol/L   Chloride 102 98 - 111 mmol/L   CO2 29 22 - 32 mmol/L   Glucose, Bld 153 (H) 70 - 99 mg/dL   BUN 17 6 - 20 mg/dL   Creatinine, Ser 0.92 0.44 - 1.00 mg/dL   Calcium 9.7 8.9 - 10.3 mg/dL   GFR, Estimated >60 >60 mL/min   Anion gap 9 5 - 15  Brain natriuretic peptide  Result Value Ref Range   B Natriuretic Peptide 94.2 0.0 - 100.0 pg/mL  CBC  Result Value Ref Range   WBC 8.3 4.0 - 10.5 K/uL   RBC 5.87 (H) 3.87 - 5.11 MIL/uL   Hemoglobin 12.0 12.0 - 15.0 g/dL   HCT 41.3 36.0 - 46.0 %   MCV 70.4 (L) 80.0 - 100.0 fL   MCH 20.4 (L) 26.0 - 34.0 pg   MCHC 29.1 (L) 30.0 - 36.0 g/dL   RDW 22.1 (H) 11.5 - 15.5 %   Platelets 262 150 - 400 K/uL   nRBC 0.0 0.0 - 0.2 %  I-Stat beta hCG blood, ED  Result Value Ref Range   I-stat hCG, quantitative <5.0 <5 mIU/mL   Comment 3          Troponin I (High Sensitivity)  Result Value Ref Range   Troponin I (High Sensitivity) 47 (H) <18 ng/L   DG Chest 2 View  Result Date: 04/23/2021 CLINICAL DATA:  Chest pain and shortness of breath EXAM: CHEST - 2 VIEW COMPARISON:  Chest x-ray dated June 30, 2019 FINDINGS: Cardiomegaly. Bilateral interstitial opacities. No evidence of pleural effusion or pneumothorax. IMPRESSION: Bilateral interstitial opacities, likely due to pulmonary edema. Cardiomegaly. Electronically Signed   By: Yetta Glassman M.D.   On: 04/23/2021 11:45    EKG EKG Interpretation  Date/Time:  Monday April 23 2021 10:58:13 EDT Ventricular Rate:  96 PR Interval:  141 QRS Duration: 77 QT Interval:  355 QTC Calculation: 449 R Axis:   21 Text Interpretation: Sinus rhythm Atrial premature complex Non-specific ST-t changes Confirmed by Lajean Saver [phone]) on 04/23/2021 1:44:19 PM  Radiology DG Chest 2 View  Result Date: 04/23/2021 CLINICAL DATA:  Chest pain and shortness of breath EXAM: CHEST - 2 VIEW COMPARISON:  Chest x-ray dated June 30, 2019 FINDINGS: Cardiomegaly. Bilateral interstitial opacities. No  evidence of pleural effusion or pneumothorax. IMPRESSION: Bilateral interstitial opacities, likely due to pulmonary edema. Cardiomegaly. Electronically Signed   By: Yetta Glassman M.D.   On: 04/23/2021 11:45    Procedures Procedures   Medications Ordered in ED Medications  methylPREDNISolone sodium succinate (SOLU-MEDROL) 125 mg/2 mL injection 125 mg (has no administration in time range)  albuterol (PROVENTIL) (2.5 MG/3ML) 0.083% nebulizer solution 5 mg (has no administration in time range)  ipratropium (ATROVENT) nebulizer solution 0.5 mg (has no administration in time range)  nitroGLYCERIN (NITROSTAT) SL tablet 0.4 mg (has no administration in time range)  nitroGLYCERIN (NITROGLYN) 2 % ointment 1 inch (has  no administration in time range)    ED Course  I have reviewed the triage vital signs and the nursing notes.  Pertinent labs & imaging results that were available during my care of the patient were reviewed by me and considered in my medical decision making (see chart for details).    MDM Rules/Calculators/A&P                          Iv ns. Continuous pulse ox and cardiac monitoring. Stat labs. Imaging. Ecg.   Reviewed nursing notes and prior charts for additional history.   Labs reviewed/interpreted by me - k normal. Wbc and hgb normal.   CXR reviewed/interpreted by me - + bil infil ? edema.   BP is very high. NTG SL, and NTG gtt. Lasix iv. Bipap. Albuterol treatment.   Recheck pt breathing better. No chest pain. Bp improved from prior. Will also provide dose of her normal bp meds which she hasnt had today.  Wheezing persists, but improved. Albuterol tx.   Recheck pt, no chest pain currently.   CRITICAL CARE RE: dyspnea w hypoxia, severe/uncontrolled htn with pulm edema, elevated trop. Performed by: Mirna Mires Total critical care time: 40 minutes Critical care time was exclusive of separately billable procedures and treating other patients. Critical care was  necessary to treat or prevent imminent or life-threatening deterioration. Critical care was time spent personally by me on the following activities: development of treatment plan with patient and/or surrogate as well as nursing, discussions with consultants, evaluation of patient's response to treatment, examination of patient, obtaining history from patient or surrogate, ordering and performing treatments and interventions, ordering and review of laboratory studies, ordering and review of radiographic studies, pulse oximetry and re-evaluation of patient's condition.  Hospitalist consulted for admission.      Final Clinical Impression(s) / ED Diagnoses Final diagnoses:  None    Rx / DC Orders ED Discharge Orders     None        Lajean Saver, MD 04/23/21 1356

## 2021-04-23 NOTE — ED Triage Notes (Signed)
Patient c/o constant chest pain and SOB since yesterday. Patient normally wears O2 2L/min via [place]. But turned it up to 3L/min. Patient also c/o left sided headache x 2 days. Patient also c/o urinary frequency

## 2021-04-23 NOTE — H&P (Signed)
History and Physical    Cassandra Burns ZOX:[id number] DOB: 12-30-1965 DOA: 04/23/2021  PCP: [name], MD Consultants:  Halford Chessman - pulmonology Patient coming from:  Home - lives with daughter; NOK: Daughter, Amberle Lyter, [phone]  Chief Complaint: SOB  HPI: Cassandra Burns is a 56 y.o. female with medical history significant of DM; HTN; morbid obesity; sarcoidosis; and OSA on BIPAP presenting with SOB.  She reports that she has had DOE, orthopnea, and polyuria.  She felt like she must be in heart failure.  She has had cough that is mildly productive of greenish sputum.  No LE edema.  She uses BIPAP at home and tried to use the oxygen from her BIPAP without improvement.  She usually uses 2-3L home O2 but increased that as well without improvement.  + wheezing.   ED Course: Usually on 2L prn, more SOB and now on 3L consistently.  BP 220s/110s.  Scant edema.  EKG is ok.  CXR ? Pulm edema - likely HTN urgency.  On BIPAP, NTG drip.  BP now 170/90.  Given home meds.  BNP ok.  Troponin slightly high, needs trending.  Also was wheezing, given solumedrol and breathing treatments.  Review of Systems: As per HPI; otherwise review of systems reviewed and negative.   Ambulatory Status:  Ambulates without assistance  COVID Vaccine Status:  Complete, no booster  Past Medical History:  Diagnosis Date   Anxiety    Asthma    Bone pain 06/07/2013   Depression    Diabetes mellitus 12/18/2012   NIDDM   Hernia of abdominal cavity 10/25/2013   History of anemia 07/26/2013   Hypertension    Iron deficiency anemia due to chronic blood loss    Lymphadenopathy 06/07/2013   Obesity 12/18/2012   Psoriasis    Sarcoidosis 07/26/2013   Sleep apnea    uses bipap    Past Surgical History:  Procedure Laterality Date   TUBAL LIGATION      Social History   Socioeconomic History   Marital status: Single    Spouse name: Not on file   Number of children: 2   Years of education:  Not on file   Highest education level: Not on file  Occupational History   Occupation: homemaker  Tobacco Use   Smoking status: Former    Types: Cigars    Quit date: 06/30/2017    Years since quitting: 3.8   Smokeless tobacco: Never   Tobacco comments:    Smokes Marijuana ("here and there") (as needed)  Vaping Use   Vaping Use: Never used  Substance and Sexual Activity   Alcohol use: Yes    Comment: occasionally   Drug use: Yes    Frequency: 7.0 times per week    Types: Marijuana    Comment: Marijuana--once a week   Sexual activity: Yes  Other Topics Concern   Not on file  Social History Narrative   Not on file   Social Determinants of Health   Financial Resource Strain: Not on file  Food Insecurity: Not on file  Transportation Needs: Not on file  Physical Activity: Not on file  Stress: Not on file  Social Connections: Not on file  Intimate Partner Violence: Not on file    No Known Allergies  Family History  Problem Relation Age of Onset   Diabetes type II Father    Asthma Father    Hypertension Father    Heart murmur Father    Alcohol abuse Father  Drug abuse Father    Hypertension Mother    Alcohol abuse Mother    Drug abuse Mother     Prior to Admission medications   Medication Sig Start Date End Date Taking? Authorizing Provider  acetaminophen (TYLENOL) 500 MG tablet Take 500 mg by mouth every 6 (six) hours as needed for mild pain.    [provider]  albuterol (PROVENTIL) (2.5 MG/3ML) 0.083% nebulizer solution Take 3 mLs (2.5 mg total) by nebulization every 6 (six) hours as needed for wheezing or shortness of breath. 05/19/17   Magdalen Spatz, NP  albuterol (VENTOLIN HFA) 108 (90 Base) MCG/ACT inhaler Inhale 1-2 puffs into the lungs every 6 (six) hours as needed for wheezing or shortness of breath. 11/09/19   Martyn Ehrich, NP  amLODipine (NORVASC) 10 MG tablet Take 1 tablet (10 mg total) by mouth daily. 07/13/14   Donita Brooks, NP   budesonide-formoterol (SYMBICORT) 160-4.5 MCG/ACT inhaler Inhale 2 puffs into the lungs 2 (two) times daily. 09/29/17   Magdalen Spatz, NP  cyclobenzaprine (FLEXERIL) 10 MG tablet Take 1 tablet (10 mg total) by mouth at bedtime. 09/26/20   Hans Eden, NP  fluticasone (FLONASE) 50 MCG/ACT nasal spray SMARTSIG:2 Spray(s) Both Nares Every Evening 10/13/19   [provider]  gabapentin (NEURONTIN) 400 MG capsule Take 400 mg by mouth every 6 (six) hours. 05/26/19   [provider]  gabapentin (NEURONTIN) 800 MG tablet Take 800 mg by mouth 3 (three) times daily. 09/14/20   [provider]  ibuprofen (ADVIL) 800 MG tablet Take 1 tablet (800 mg total) by mouth 3 (three) times daily. 09/26/20   White, Leitha Schuller, NP  labetalol (NORMODYNE) 100 MG tablet Take 100 mg by mouth 2 (two) times daily. 10/27/19   [provider]  losartan-hydrochlorothiazide (HYZAAR) 100-12.5 MG tablet Take 1 tablet by mouth daily.  05/14/19   [provider]  metFORMIN (GLUCOPHAGE) 1000 MG tablet Take 1,000 mg by mouth daily with breakfast.     [provider]  predniSONE (DELTASONE) 10 MG tablet Take 4 tabs x 2 days, then 3 x 2d, then 2 x 2d, then 1 x 2d and STOP Patient not taking: Reported on 01/02/2021 10/25/19   Magdalen Spatz, NP  predniSONE (DELTASONE) 10 MG tablet Take 4 tabs for 2 days, then 3 tabs for 2 days, 2 tabs for 2 days, then 1 tab for 2 days, then stop. Patient not taking: Reported on 01/02/2021 01/10/20   Magdalen Spatz, NP  TOUJEO SOLOSTAR 300 UNIT/ML SOPN Inject 40-50 Units into the skin 2 (two) times daily. Take 50 units in the morning and Take 40 units in the evening. 02/09/19   [provider]  TRULICITY 1.61 WR/6.0AV SOPN Inject 0.75 mg into the skin once a week.  06/14/19   [provider]  Vitamin D, Ergocalciferol, (DRISDOL) 1.25 MG (50000 UT) CAPS capsule Take 50,000 Units by mouth every 7 (seven) days. Take on Sunday. Patient not taking:  Reported on 01/02/2021    [provider]    Physical Exam: Vitals:   04/23/21 1800 04/23/21 1815 04/23/21 1830 04/23/21 1845  BP: (!) 172/87 (!) 142/90 (!) 163/85 (!) 172/84  Pulse: (!) 107 (!) 104 (!) 106 (!) 111  Resp: 20 (!) 22 17 16   Temp:      TempSrc:      SpO2: 91% 90% (!) 89% 90%  Weight:      Height:  General:  Appears calm and comfortable and is in NAD, on BIPAP but very conversant with mask on Eyes:  EOMI, normal lids, iris ENT:  BIPAP in place Neck:  no LAD, masses Cardiovascular:  RR with mild tachycardia, no m/r/g. No LE edema.  Respiratory:   CTA bilaterally with no wheezes/rales/rhonchi.  Mildly increased respiratory effort on BIPAP. Abdomen:  soft, NT, ND Skin:  no rash or induration seen on limited exam Musculoskeletal:  grossly normal tone BUE/BLE, good ROM, no bony abnormality Psychiatric:  grossly normal mood and affect, speech fluent and appropriate despite BIPAP, AOx3 Neurologic:  CN 2-12 grossly intact, moves all extremities in coordinated fashion    Radiological Exams on Admission: Independently reviewed - see discussion in A/P where applicable  DG Chest 2 View  Result Date: 04/23/2021 CLINICAL DATA:  Chest pain and shortness of breath EXAM: CHEST - 2 VIEW COMPARISON:  Chest x-ray dated June 30, 2019 FINDINGS: Cardiomegaly. Bilateral interstitial opacities. No evidence of pleural effusion or pneumothorax. IMPRESSION: Bilateral interstitial opacities, likely due to pulmonary edema. Cardiomegaly. Electronically Signed   By: Yetta Glassman M.D.   On: 04/23/2021 11:45    EKG: Independently reviewed.  NSR with rate 96; nonspecific ST changes with no evidence of acute ischemia   Labs on Admission: I have personally reviewed the available labs and imaging studies at the time of the admission.  Pertinent labs:   Glucose 153 BNP 94.2 HS troponin 47, 39 Unremarkable CBC HCG <5 COVID/flu negative   Assessment/Plan Principal  Problem:   Acute on chronic respiratory failure with hypoxia (HCC) Active Problems:   Essential hypertension   DM (diabetes mellitus) (HCC)   Morbid obesity (HCC)   OSA (obstructive sleep apnea)   HTN (hypertension)   Acute on chronic respiratory failure, multifactorial -Patient with underlying lung issues, morbid obesity/OHS, and diastolic CHF -She is in 8-2N home O2 as well as nocturnal BIPAP -Still with hypoxia on presentation -Improved symptoms with BIPAP -Markedly elevated BP on arrival, suspect hypertensive crisis as primary issue with a number of co-factors -Will admit to SDU -Continue BIPAP as needed -Continue NTG drip as well as home BP meds  Hypertensive crisis -She reports medication compliance and reasonable BP at home  -BP 224/107 upon presentation -She was started by the EDP on NTG drip with control -Can wean off drip as BP tolerates -Elevated troponin on presentation but improved with repeat; likely demand ischemia, low suspicion for ACS -Continue Norvasc, Labetalol (taking once daily, consider increase to BID), Hyzaar -Will add prn hydralazine   Asthma, sarcoidosis -Possible flare -Duonebs, prn albuterol -Continue Symbicort (Dulera per formulary) -Steroids - solumedrol in the ER but PO prednisone starting tomorrow -Hold antibiotics for now  Chronic diastolic CHF -Prior echo in 03/2020 with preserved EF and grade 1 diastolic dysfunction -Her clinical description appears more consistent with CHF than her physical exam -CXR read as likely edema but normal BNP -Will repeat Echo -Hold further diuresis at this time - she received 1 dose in the ER  DM -Will check A1c, prior poor control -hold Trulicity, Glucophage -Continue Toujeo -Cover with moderate-scale SSI  -Continue Neurontin  Morbid obesity -Body mass index is 44.43 kg/m..  -Weight loss should be encouraged -Outpatient PCP/bariatric medicine/bariatric surgery f/u encouraged      Note: This  patient has been tested and is negative for the novel coronavirus COVID-19. The patient has been fully vaccinated against COVID-19.   Level of care: Stepdown DVT prophylaxis:  Lovenox  Code Status:  Full - confirmed with patient/family Family Communication: Niece was present throughout evaluation Disposition Plan:  The patient is from: home  Anticipated d/c is to: home without [facility] services  Anticipated d/c date will depend on clinical response to treatment, likely 2-3 days  Patient is currently: acutely ill Consults called: Diabetes coordinator; [facility] team; nutrition consult  Admission status:  Admit - It is my clinical opinion that admission to INPATIENT is reasonable and necessary because of the expectation that this patient will require hospital care that crosses at least 2 midnights to treat this condition based on the medical complexity of the problems presented.  Given the aforementioned information, the predictability of an adverse outcome is felt to be significant.    Karmen Bongo MD [facility]   How to contact the [facility] Attending or Consulting provider [place] or covering provider during after hours [place], for this patient?  Check the care team in [facility] and look for a) attending/consulting TRH provider listed and b) the [facility] team listed Log into [email or web address] and use [facility]'s universal password to access. If you do not have the password, please contact the hospital operator. Locate the [facility] provider you are looking for under [facility] and page to a number that you can be directly reached. If you still have difficulty reaching the provider, please page the [facility] (Director on Call) for the Hospitalists listed on amion for assistance.   04/23/2021, 7:00 PM

## 2021-04-24 ENCOUNTER — Other Ambulatory Visit (HOSPITAL_COMMUNITY): Payer: Self-pay

## 2021-04-24 ENCOUNTER — Inpatient Hospital Stay (HOSPITAL_COMMUNITY): Payer: Medicare HMO

## 2021-04-24 DIAGNOSIS — R778 Other specified abnormalities of plasma proteins: Secondary | ICD-10-CM

## 2021-04-24 DIAGNOSIS — I5031 Acute diastolic (congestive) heart failure: Secondary | ICD-10-CM | POA: Diagnosis not present

## 2021-04-24 DIAGNOSIS — I161 Hypertensive emergency: Secondary | ICD-10-CM

## 2021-04-24 LAB — BASIC METABOLIC PANEL
Anion gap: 10 (ref 5–15)
Anion gap: 12 (ref 5–15)
BUN: 28 mg/dL — ABNORMAL HIGH (ref 6–20)
BUN: 44 mg/dL — ABNORMAL HIGH (ref 6–20)
CO2: 25 mmol/L (ref 22–32)
CO2: 26 mmol/L (ref 22–32)
Calcium: 9.4 mg/dL (ref 8.9–10.3)
Calcium: 10.5 mg/dL — ABNORMAL HIGH (ref 8.9–10.3)
Chloride: 96 mmol/L — ABNORMAL LOW (ref 98–111)
Chloride: 98 mmol/L (ref 98–111)
Creatinine, Ser: 1.22 mg/dL — ABNORMAL HIGH (ref 0.44–1.00)
Creatinine, Ser: 1.07 mg/dL — ABNORMAL HIGH (ref 0.44–1.00)
GFR, Estimated: 52 mL/min — ABNORMAL LOW (ref >60)
GFR, Estimated: >60 mL/min (ref >60)
Glucose, Bld: 401 mg/dL — ABNORMAL HIGH (ref 70–99)
Glucose, Bld: 340 mg/dL — ABNORMAL HIGH (ref 70–99)
Potassium: 5.4 mmol/L — ABNORMAL HIGH (ref 3.5–5.1)
Potassium: 4.8 mmol/L (ref 3.5–5.1)
Sodium: 131 mmol/L — ABNORMAL LOW (ref 135–145)
Sodium: 136 mmol/L (ref 135–145)

## 2021-04-24 LAB — GLUCOSE, CAPILLARY
Glucose-Capillary: 427 mg/dL — ABNORMAL HIGH (ref 70–99)
Glucose-Capillary: 343 mg/dL — ABNORMAL HIGH (ref 70–99)
Glucose-Capillary: 206 mg/dL — ABNORMAL HIGH (ref 70–99)
Glucose-Capillary: 377 mg/dL — ABNORMAL HIGH (ref 70–99)
Glucose-Capillary: 336 mg/dL — ABNORMAL HIGH (ref 70–99)

## 2021-04-24 LAB — CBC
HCT: 36.4 % (ref 36.0–46.0)
Hemoglobin: 10.7 g/dL — ABNORMAL LOW (ref 12.0–15.0)
MCH: 20.4 pg — ABNORMAL LOW (ref 26.0–34.0)
MCHC: 29.4 g/dL — ABNORMAL LOW (ref 30.0–36.0)
MCV: 69.5 fL — ABNORMAL LOW (ref 80.0–100.0)
Platelets: 255 10*3/uL (ref 150–400)
RBC: 5.24 MIL/uL — ABNORMAL HIGH (ref 3.87–5.11)
RDW: 21.3 % — ABNORMAL HIGH (ref 11.5–15.5)
WBC: 8.1 10*3/uL (ref 4.0–10.5)
nRBC: 0 % (ref 0.0–0.2)

## 2021-04-24 LAB — ECHOCARDIOGRAM COMPLETE
Area-P 1/2: 1.45 cm2
Height: 59 in
S' Lateral: 2.7 cm
Weight: 3520 oz

## 2021-04-24 LAB — HEMOGLOBIN A1C
Hgb A1c MFr Bld: 8.1 % — ABNORMAL HIGH (ref 4.8–5.6)
Mean Plasma Glucose: 185.77 mg/dL

## 2021-04-24 MED ORDER — ADULT MULTIVITAMIN W/MINERALS CH
1.0000 | ORAL_TABLET | Freq: Every day | ORAL | Status: DC
  Administered 2021-04-24 – 2021-04-27 (×4): 1 via ORAL
  Filled 2021-04-24 (×4): qty 1

## 2021-04-24 MED ORDER — INSULIN ASPART 100 UNIT/ML IJ SOLN
6.0000 [IU] | Freq: Three times a day (TID) | INTRAMUSCULAR | Status: DC
  Administered 2021-04-25 – 2021-04-27 (×5): 6 [IU] via SUBCUTANEOUS

## 2021-04-24 MED ORDER — PERFLUTREN LIPID MICROSPHERE
1.0000 mL | INTRAVENOUS | Status: AC | PRN
  Administered 2021-04-24: 2 mL via INTRAVENOUS

## 2021-04-24 MED ORDER — GLUCERNA SHAKE PO LIQD
237.0000 mL | ORAL | Status: DC
  Administered 2021-04-24 – 2021-04-26 (×2): 237 mL via ORAL
  Filled 2021-04-24 (×4): qty 237

## 2021-04-24 MED ORDER — PROSOURCE PLUS PO LIQD
30.0000 mL | Freq: Two times a day (BID) | ORAL | Status: DC
  Administered 2021-04-24 – 2021-04-27 (×6): 30 mL via ORAL
  Filled 2021-04-24 (×6): qty 30

## 2021-04-24 MED ORDER — INSULIN ASPART 100 UNIT/ML IJ SOLN
4.0000 [IU] | Freq: Three times a day (TID) | INTRAMUSCULAR | Status: DC
  Administered 2021-04-24 (×3): 4 [IU] via SUBCUTANEOUS

## 2021-04-24 MED ORDER — INSULIN ASPART 100 UNIT/ML IJ SOLN
0.0000 [IU] | Freq: Three times a day (TID) | INTRAMUSCULAR | Status: DC
  Administered 2021-04-24: 20 [IU] via SUBCUTANEOUS
  Administered 2021-04-24: 7 [IU] via SUBCUTANEOUS
  Administered 2021-04-24: 15 [IU] via SUBCUTANEOUS
  Administered 2021-04-25: 11 [IU] via SUBCUTANEOUS
  Administered 2021-04-25: 20 [IU] via SUBCUTANEOUS
  Administered 2021-04-26: 3 [IU] via SUBCUTANEOUS
  Administered 2021-04-27: 9 [IU] via SUBCUTANEOUS
  Administered 2021-04-27: 11 [IU] via SUBCUTANEOUS

## 2021-04-24 MED ORDER — HYDRALAZINE HCL 20 MG/ML IJ SOLN
5.0000 mg | INTRAMUSCULAR | Status: DC | PRN
  Filled 2021-04-24: qty 1

## 2021-04-24 MED ORDER — SODIUM ZIRCONIUM CYCLOSILICATE 10 G PO PACK
10.0000 g | PACK | Freq: Once | ORAL | Status: AC
  Administered 2021-04-24: 10 g via ORAL
  Filled 2021-04-24: qty 1

## 2021-04-24 MED ORDER — INSULIN ASPART 100 UNIT/ML IJ SOLN
0.0000 [IU] | Freq: Every day | INTRAMUSCULAR | Status: DC
  Administered 2021-04-24: 4 [IU] via SUBCUTANEOUS
  Administered 2021-04-25: 3 [IU] via SUBCUTANEOUS

## 2021-04-24 MED ORDER — INSULIN ASPART 100 UNIT/ML IJ SOLN
10.0000 [IU] | Freq: Once | INTRAMUSCULAR | Status: AC
  Administered 2021-04-24: 10 [IU] via SUBCUTANEOUS

## 2021-04-24 MED ORDER — DM-GUAIFENESIN ER 30-600 MG PO TB12
1.0000 | ORAL_TABLET | Freq: Two times a day (BID) | ORAL | Status: AC
  Administered 2021-04-25: 1 via ORAL
  Filled 2021-04-24: qty 1

## 2021-04-24 MED ORDER — LABETALOL HCL 100 MG PO TABS
100.0000 mg | ORAL_TABLET | Freq: Two times a day (BID) | ORAL | Status: DC
  Administered 2021-04-24 – 2021-04-27 (×7): 100 mg via ORAL
  Filled 2021-04-24 (×7): qty 1

## 2021-04-24 NOTE — Progress Notes (Signed)
  Echocardiogram 2D Echocardiogram has been performed.  Darlina Sicilian M 04/24/2021, 3:21 PM

## 2021-04-24 NOTE — Evaluation (Signed)
Occupational Therapy Evaluation Patient Details Name: Cassandra Burns MRN: [id number] DOB: [birth year] Today's Date: 04/24/2021   History of Present Illness 55 y.o. female  presenting with SOB, dx of acute on chronic respiratory failure, hypertensive crisis, CHF.  Pt with medical history significant of DM; HTN; morbid obesity; sarcoidosis; and OSA on BIPAP.   Clinical Impression   Patient lives alone in an apartment, is independent at baseline and uses O2 at baseline. Cares for granddaughter everyday, patient's daughter lives "around the corner." Patient needing increased time, otherwise no physical assistance to perform ADLs listed below or functional ambulation. Maintained low 90s on 3L O2 during session. Patient would be excellent candidate to mobilize with nursing and mobility tech, no acute OT needs at this time will sign off.       Recommendations for follow up therapy are one component of a multi-disciplinary discharge planning process, led by the attending physician.  Recommendations may be updated based on patient status, additional functional criteria and insurance authorization.   Follow Up Recommendations  No OT follow up    Equipment Recommendations  None recommended by OT       Precautions / Restrictions Precautions Precaution Comments: monitor O2 Restrictions Weight Bearing Restrictions: No      Mobility Bed Mobility               General bed mobility comments: in recliner    Transfers Overall transfer level: Independent Equipment used: None             General transfer comment: does not need physical assist to power up to standing from recliner chair or bedside commode    Balance Overall balance assessment: Independent                                         ADL either performed or assessed with clinical judgement   ADL Overall ADL's : Independent                                       General ADL  Comments: patient was able to perform perianal care, clothing management and transfer on/off commode without assistance. Ambulated in hallway with increased time however no use of AD. Did wear 3L o2 maintaining 93%      Pertinent Vitals/Pain Pain Assessment: Faces Faces Pain Scale: Hurts little more Pain Location: abdominal cramps Pain Descriptors / Indicators: Cramping Pain Intervention(s): Monitored during session     Hand Dominance Right   Extremity/Trunk Assessment Upper Extremity Assessment Upper Extremity Assessment: Overall WFL for tasks assessed   Lower Extremity Assessment Lower Extremity Assessment: Defer to PT evaluation   Cervical / Trunk Assessment Cervical / Trunk Assessment: Other exceptions Cervical / Trunk Exceptions: body habitus   Communication Communication Communication: No difficulties   Cognition Arousal/Alertness: Awake/alert Behavior During Therapy: WFL for tasks assessed/performed Overall Cognitive Status: Within Functional Limits for tasks assessed                                                Home Living Family/patient expects to be discharged to:: Private residence Living Arrangements: Alone Available Help at Discharge: Family;Available PRN/intermittently Type of Home: Apartment Home Access:  Stairs to enter [employer] of Steps: 3 Entrance Stairs-Rails: None Home Layout: One level     Bathroom Shower/Tub: Teacher, early years/pre: Standard     Home Equipment: Cane - single point          Prior Functioning/Environment Level of Independence: Independent        Comments: walks without AD, used O2 prn at home; sponge bathed at home due to L wrist issues, drives        OT Problem List: Decreased activity tolerance;Cardiopulmonary status limiting activity         OT Goals(Current goals can be found in the care plan section) Acute Rehab OT Goals Patient Stated Goal: get home OT Goal  Formulation: All assessment and education complete, DC therapy      AM-PAC OT "6 Clicks" Daily Activity     Outcome Measure Help from another person eating meals?: None Help from another person taking care of personal grooming?: None Help from another person toileting, which includes using toliet, bedpan, or urinal?: None Help from another person bathing (including washing, rinsing, drying)?: None Help from another person to put on and taking off regular upper body clothing?: None Help from another person to put on and taking off regular lower body clothing?: None 6 Click Score: 24   End of Session Equipment Utilized During Treatment: Oxygen Nurse Communication: Mobility status  Activity Tolerance: Patient tolerated treatment well Patient left: in chair;with call bell/phone within reach  OT Visit Diagnosis: Other abnormalities of gait and mobility (R26.89)                Time: 1117-3567 OT Time Calculation (min): 31 min Charges:  OT General Charges $OT Visit: 1 Visit OT Evaluation $OT Eval Low Complexity: 1 Low  Delbert Phenix OT OT pager: [place] 04/24/2021, 12:31 PM

## 2021-04-24 NOTE — TOC Initial Note (Signed)
Transition of Care [facility]) - Initial/Assessment Note    Patient Details  Name: Cassandra Burns MRN: [id number] Date of Birth: 04-Apr-1966  Transition of Care [facility]) CM/SW Contact:    Leeroy Cha, RN Phone Number: 04/24/2021, 8:48 AM  Clinical Narrative:                  55 y.o. female with medical history significant of DM; HTN; morbid obesity; sarcoidosis; and OSA on BIPAP presenting with SOB.  She reports that she has had DOE, orthopnea, and polyuria.  She felt like she must be in heart failure.  She has had cough that is mildly productive of greenish sputum.  No LE edema.  She uses BIPAP at home and tried to use the oxygen from her BIPAP without improvement.  She usually uses 2-3L home O2 but increased that as well without improvement.  + wheezing.     ED Course: Usually on 2L prn, more SOB and now on 3L consistently.  BP 220s/110s.  Scant edema.  EKG is ok.  CXR ? Pulm edema - likely HTN urgency.  On BIPAP, NTG drip.  BP now 170/90.  Given home meds.  BNP ok.  Troponin slightly high, needs trending.  Also was wheezing, given solumedrol and breathing treatments. TOC PLAN OF CARE: from home alone, heart failure [facility] at discharge [facility]. On 02 at 3l/min during day  and bipap at night. Plan is to return home with dz management and hhc if needed. Expected Discharge Plan: [place] Barriers to Discharge: Continued Medical Work up   Patient Goals and CMS Choice Patient states their goals for this hospitalization and ongoing recovery are:: to go home CMS Medicare.gov Compare Post Acute Care list provided to:: Patient    Expected Discharge Plan and Services Expected Discharge Plan: [place]   Discharge Planning Services: CM Consult Post Acute Care Choice: [place] arrangements for the past 2 months: Apartment                                      Prior Living Arrangements/Services Living  arrangements for the past 2 months: Apartment Lives with:: Self Patient language and need for interpreter reviewed:: Yes Do you feel safe going back to the place where you live?: Yes            Criminal Activity/Legal Involvement Pertinent to Current Situation/Hospitalization: No - Comment as needed  Activities of Daily Living Home Assistive Devices/Equipment: Oxygen, Nebulizer, BIPAP, Cane (specify quad or straight), Walker (specify type), Eyeglasses (reading glasses) ADL Screening (condition at time of admission) Patient's cognitive ability adequate to safely complete daily activities?: Yes Is the patient deaf or have difficulty hearing?: No Does the patient have difficulty seeing, even when wearing glasses/contacts?: Yes (vision is blurry when blood pressure goes up) Does the patient have difficulty concentrating, remembering, or making decisions?: No Patient able to express need for assistance with ADLs?: Yes Does the patient have difficulty dressing or bathing?: No Independently performs ADLs?: Yes (appropriate for developmental age) Does the patient have difficulty walking or climbing stairs?: Yes Weakness of Legs: Both Weakness of Arms/Hands: Both  Permission Sought/Granted                  Emotional Assessment Appearance:: Appears stated age     Orientation: : Oriented to Self, Oriented to  Place, Oriented to  Time, Oriented to Situation Alcohol / Substance Use: Not Applicable    Admission diagnosis:  Sarcoidosis [D86.9] Acute pulmonary edema (HCC) [J81.0] Elevated troponin [R77.8] Uncontrolled hypertension [I10] Moderate persistent reactive airway disease with acute exacerbation [J45.41] Acute on chronic respiratory failure with hypoxia (HCC) [J96.21] Patient Active Problem List   Diagnosis Date Noted   Acute on chronic respiratory failure with hypoxia ([place]) 04/23/2021   Marijuana use 01/03/2021   Asthma 05/19/2017   HTN (hypertension)    SOB (shortness of  breath)    Rectal bleeding 04/14/2014   Diastasis recti 11/04/2013   Hernia of abdominal cavity 10/25/2013   Sarcoidosis 07/26/2013   Unspecified deficiency anemia 07/26/2013   Bone pain 06/07/2013   Lymphadenopathy 06/07/2013   Obesity hypoventilation syndrome 04/06/2013   Depression, major, recurrent, moderate (HCC) 01/19/2013   Generalized anxiety disorder 01/19/2013   OSA (obstructive sleep apnea) 11/19/2012   Mediastinal lymphadenopathy 11/03/2012   DOE (dyspnea on exertion) 11/03/2012   DM (diabetes mellitus) ([place]) 11/07/2011   Morbid obesity ([place]) 11/07/2011   Essential hypertension    PCP:  [name], MD Pharmacy:   [facility] ([place]), [place] - [address] [place]) [place] [address] Phone: [phone] Fax: [phone]  [id number] - [place], [place] - [place] [address]  [place] [address] Phone: [phone] Fax: [phone]     Social Determinants of Health (SDOH) Interventions    Readmission Risk Interventions No flowsheet data found.

## 2021-04-24 NOTE — TOC Benefit Eligibility Note (Signed)
Patient Teacher, English as a foreign language completed.    The patient is currently admitted and upon discharge could be taking Farxiga 10 mg.  The current 30 day co-pay is, $0.00.   The patient is currently admitted and upon discharge could be taking Jardiance 10 mg.  The current 30 day co-pay is, $0.00.   The patient is insured through [place], [place] Patient Advocate Specialist [place] Team Direct Number: [phone]  Fax: [phone]

## 2021-04-24 NOTE — Progress Notes (Signed)
PROGRESS NOTE   Cassandra Burns  PIR:[id number]    DOB: June 27, 1966    DOA: 04/23/2021  PCP: [name], MD   I have briefly reviewed patients previous medical records in [facility].  Chief Complaint  Patient presents with   Chest Pain   Shortness of Breath   Urinary Frequency   Headache    Brief Narrative:  A 55-year-old female with medical history significant for DM2, HTN, morbid obesity, sarcoidosis, chronic respiratory failure with hypoxia on home oxygen 2 to 3 L/min during the daytime and OSA with BiPAP at bedtime, presented with progressive dyspnea, orthopnea, polyuria, mildly productive cough and wheezing.  In the ED, BP was as high as 220/110, was started on BiPAP and NTG drip.  Admitted for acute on chronic respiratory failure with hypoxia, multifactorial.   Assessment & Plan:  Principal Problem:   Acute on chronic respiratory failure with hypoxia (HCC) Active Problems:   Essential hypertension   DM (diabetes mellitus) ([place])   Morbid obesity (HCC)   OSA (obstructive sleep apnea)   HTN (hypertension)   Acute on chronic respiratory failure with hypoxia: - Acute phase likely related to accelerated hypertension, acute on chronic diastolic CHF and asthma/cardiac asthma exacerbation. - Chronic respiratory failure due to morbid obesity, OSA and possible OHS. - Overnight was on BiPAP.  Able to come off this morning and saturating in the low 90s on 3 L/min nasal cannula oxygen. - Continue nasal cannula oxygen during the daytime 2-3 L/min and bedtime BiPAP. - Treat underlying causes as below.  Hypertensive emergency - Reports compliance with medications at home. - BP on presentation 224/107. - Had been started on NTG drip, weaned off at approximately 5 AM 9/27. - Continue amlodipine 10 Mg daily, labetalol increased to 200 Mg twice daily.  Could consider transitioning from labetalol to cardioselective beta-blocker i.e. metoprolol etc.  Hyzaar held due to AKI and  hyperkalemia.  Continue as needed hydralazine.  Asthma exacerbation/sarcoidosis - Received a dose of IV Solu-Medrol in ED, now on oral prednisone - Continue duo nebs and as needed albuterol nebulizations. - Continue Dulera, formulary substitution for Symbicort. - Holding antibiotics for now.  Acute on chronic diastolic CHF/atypical chest pain - S/p IV Lasix dose yesterday. - Clinically appears euvolemic at this time.  Moreover developed AKI and hyperkalemia. - Follow repeat echo.  Prior echo 03/2020 showed preserved EF and grade 1 diastolic dysfunction. - Minimally elevated troponin with downward trend, 47 > 39, likely due to demand ischemia.  Acute kidney injury: - Creatinine went up from 0.92-1.22. - Likely related to IV Lasix and accelerated hypertension. - Hold Hyzaar.  Follow BMP in AM.  Hyperkalemia - Likely due to AKI. - Discontinued Hyzaar for now.  Give a dose of Lokelma 10 g. - Follow BMP this evening and tomorrow morning.  Type II DM and obese with hyperglycemia - A1c 8.1 - Worsened controlled now due to steroids. - Continue current dose of Lantus, changed SSI to resistant scale with bedtime scale and added mealtime NovoLog 4 units 3 times daily. - Monitor CBGs closely and adjust insulins as needed. - Holding Trulicity and Glucophage.  THC abuse - Strict cessation counseled.  Microcytic anemia - Unclear etiology.  This may be her baseline. - Outpatient follow-up.  Body mass index is 44.43 kg/m./Morbid obesity. - Needs lifestyle modifications and weight loss - Consider outpatient bariatric surgery consultation.    DVT prophylaxis: enoxaparin (LOVENOX) injection 40 mg Start: 04/23/21 1700     Code  Status: Full Code Family Communication: None at bedside. Disposition:  Status is: Inpatient  Remains inpatient appropriate because:Inpatient level of care appropriate due to severity of illness  Dispo: The patient is from: Home              Anticipated d/c is  to: Home              Patient currently is not medically stable to d/c.   Difficult to place patient No   Would continue to monitor her closely in the stepdown unit for additional 24 hours for any recurrent deterioration prior to transfer out to telemetry tomorrow.     Consultants:   None  Procedures:   None  Antimicrobials:    Anti-infectives (From admission, onward)    None         Subjective:  Patient interviewed and examined along with her nurse in the room.  States that she feels much better.  Dyspnea significantly improved but not yet back to baseline.  Has not been out of bed this morning.  Had some chest pain yesterday but resolved without recurrence.  Also reports that she is not wheezing any longer.  Objective:   Vitals:   04/24/21 0952 04/24/21 1030 04/24/21 1100 04/24/21 1157  BP: (!) 163/88 (!) 164/81    Pulse: 91 96    Resp: (!) 23 (!) 22 18   Temp:    (!) 97.4 F (36.3 C)  TempSrc:    Axillary  SpO2: 91% 93%  92%  Weight:      Height:        General exam: Young female, moderately built and morbidly obese, lying comfortably propped up in bed without distress. Respiratory system: Slightly diminished breath sounds bilaterally with occasional scattered expiratory rhonchi but no crackles.  No increased work of breathing.  Able to speak in full sentences. Cardiovascular system: S1 & S2 heard, RRR. No JVD, murmurs, rubs, gallops or clicks. No pedal edema.  Telemetry personally reviewed: SR-ST in the 100s. Gastrointestinal system: Abdomen is nondistended, soft and nontender. No organomegaly or masses felt. Normal bowel sounds heard. Central nervous system: Alert and oriented. No focal neurological deficits. Extremities: Symmetric 5 x 5 power. Skin: No rashes, lesions or ulcers Psychiatry: Judgement and insight appear normal. Mood & affect appropriate.     Data Reviewed:   I have personally reviewed following labs and imaging studies   CBC: Recent Labs   Lab 09-May-2021 1216 04/24/21 0246  WBC 8.3 8.1  HGB 12.0 10.7*  HCT 41.3 36.4  MCV 70.4* 69.5*  PLT 262 161    Basic Metabolic Panel: Recent Labs  Lab 05/09/21 1209 04/24/21 0246  NA 140 131*  K 4.7 5.4*  CL 102 96*  CO2 29 25  GLUCOSE 153* 401*  BUN 17 28*  CREATININE 0.92 1.22*  CALCIUM 9.7 9.4    Liver Function Tests: No results for input(s): AST, ALT, ALKPHOS, BILITOT, PROT, ALBUMIN in the last 168 hours.  CBG: Recent Labs  Lab 04/24/21 0334 04/24/21 0808 04/24/21 1140  GLUCAP 427* 343* 206*    Microbiology Studies:   Recent Results (from the past 240 hour(s))  Resp Panel by RT-PCR (Flu A&B, Covid) Nasopharyngeal Swab     Status: None   Collection Time: 2021/05/09 11:56 AM   Specimen: Nasopharyngeal Swab; Nasopharyngeal(NP) swabs in vial transport medium  Result Value Ref Range Status   SARS Coronavirus 2 by RT PCR NEGATIVE NEGATIVE Final    Comment: (NOTE) SARS-CoV-2 target nucleic  acids are NOT DETECTED.  The SARS-CoV-2 RNA is generally detectable in upper respiratory specimens during the acute phase of infection. The lowest concentration of SARS-CoV-2 viral copies this assay can detect is 138 copies/mL. A negative result does not preclude SARS-Cov-2 infection and should not be used as the sole basis for treatment or other patient management decisions. A negative result may occur with  improper specimen collection/handling, submission of specimen other than nasopharyngeal swab, presence of viral mutation(s) within the areas targeted by this assay, and inadequate number of viral copies(<138 copies/mL). A negative result must be combined with clinical observations, patient history, and epidemiological information. The expected result is Negative.  Fact Sheet for Patients:  EntrepreneurPulse.com.au  Fact Sheet for Healthcare Providers:  IncredibleEmployment.be  This test is no t yet approved or cleared by the  [place] FDA and  has been authorized for detection and/or diagnosis of SARS-CoV-2 by FDA under an Emergency Use Authorization (EUA). This EUA will remain  in effect (meaning this test can be used) for the duration of the COVID-19 declaration under Section 564(b)(1) of the Act, 21 U.S.C.section 360bbb-3(b)(1), unless the authorization is terminated  or revoked sooner.       Influenza A by PCR NEGATIVE NEGATIVE Final   Influenza B by PCR NEGATIVE NEGATIVE Final    Comment: (NOTE) The Xpert Xpress SARS-CoV-2/FLU/RSV plus assay is intended as an aid in the diagnosis of influenza from Nasopharyngeal swab specimens and should not be used as a sole basis for treatment. Nasal washings and aspirates are unacceptable for Xpert Xpress SARS-CoV-2/FLU/RSV testing.  Fact Sheet for Patients: EntrepreneurPulse.com.au  Fact Sheet for Healthcare Providers: IncredibleEmployment.be  This test is not yet approved or cleared by the [place] FDA and has been authorized for detection and/or diagnosis of SARS-CoV-2 by FDA under an Emergency Use Authorization (EUA). This EUA will remain in effect (meaning this test can be used) for the duration of the COVID-19 declaration under Section 564(b)(1) of the Act, 21 U.S.C. section 360bbb-3(b)(1), unless the authorization is terminated or revoked.  Performed at [facility], [place] [address] [place], [place] [address]   MRSA Next Gen by PCR, Nasal     Status: None   Collection Time: 04/23/21  4:21 PM   Specimen: Nasal Mucosa; Nasal Swab  Result Value Ref Range Status   MRSA by PCR Next Gen NOT DETECTED NOT DETECTED Final    Comment: (NOTE) The GeneXpert MRSA Assay (FDA approved for NASAL specimens only), is one component of a comprehensive MRSA colonization surveillance program. It is not intended to diagnose MRSA infection nor to guide or monitor treatment for MRSA infections. Test  performance is not FDA approved in patients less than 87 years old. Performed at [facility], [place] [address] [place], [place] [address]      Radiology Studies:  DG Chest 2 View  Result Date: 04/23/2021 CLINICAL DATA:  Chest pain and shortness of breath EXAM: CHEST - 2 VIEW COMPARISON:  Chest x-ray dated June 30, 2019 FINDINGS: Cardiomegaly. Bilateral interstitial opacities. No evidence of pleural effusion or pneumothorax. IMPRESSION: Bilateral interstitial opacities, likely due to pulmonary edema. Cardiomegaly. Electronically Signed   By: Yetta Glassman M.D.   On: 04/23/2021 11:45     Scheduled Meds:    amLODipine  10 mg Oral Daily   Chlorhexidine Gluconate Cloth  6 each Topical Daily   docusate sodium  100 mg Oral BID   enoxaparin (LOVENOX) injection  40 mg Subcutaneous Q24H  fluticasone  2 spray Each Nare QHS   gabapentin  800 mg Oral TID   insulin aspart  0-20 Units Subcutaneous TID WC   insulin aspart  0-5 Units Subcutaneous QHS   insulin aspart  4 Units Subcutaneous TID WC   insulin glargine-yfgn  50 Units Subcutaneous BID   ipratropium-albuterol  3 mL Nebulization Q6H   labetalol  100 mg Oral BID   mouth rinse  15 mL Mouth Rinse BID   mometasone-formoterol  2 puff Inhalation BID   predniSONE  40 mg Oral Q breakfast   sodium chloride flush  3 mL Intravenous Q12H    Continuous Infusions:    nitroGLYCERIN Stopped (04/24/21 0531)     LOS: 1 day     Vernell Leep, MD, FACP, [facility]. [facility]    To contact the attending provider between 7A-7P or the covering provider during after hours 7P-7A, please log into the web site [email or web address] and access using universal [place] password for that web site. If you do not have the password, please call the hospital operator.  04/24/2021, 1:17 PM

## 2021-04-24 NOTE — Progress Notes (Signed)
Initial Nutrition Assessment RD working remotely.  DOCUMENTATION CODES:   Morbid obesity  INTERVENTION:  - will order Glucerna Shake once/day, each supplement provides 220 kcal and 10 grams of protein. - will order 30 ml Prosource Plus BID, each supplement provides 100 kcal and 15 grams protein.  - will order 1 tablet multivitamin with minerals/day. - complete NFPE when feasible.  - monitor for need for diet education prior to d/c.   NUTRITION DIAGNOSIS:   Increased nutrient needs related to acute illness as evidenced by estimated needs.  GOAL:   Patient will meet greater than or equal to 90% of their needs  MONITOR:   PO intake, Supplement acceptance, Labs, Weight trends  REASON FOR ASSESSMENT:   Consult Assessment of nutrition requirement/status  ASSESSMENT:   55 y.o. female with medical history of DM; HTN; morbid obesity; sarcoidosis; and OSA on BIPAP and uses 2-3L O2 via [place] during the day. She presented to the ED due to shortness of breath, DOE, orthopnea, polyuria, and cough productive of a small amount of greenish sputum.  MST score of 0. No intakes documented since admission.  She has not been seen by a Ali Chukson RD at any time in the past.   Weight yesterday documented as 220 lb, which appears to be a stated weight. Weight has been trending up since 08/05/18 when she weighed 202 lb.  Non-pitting edema to BUE and very deep pitting edema to BLE documented in the edema section of flow sheet.   Per MD note today: - acute on chronic respiratory failure with hypoxia - hypertensive urgency - asthma exacerbation - acute on chronic CHF - AKI - hyperK thought to be 2/2 AKI - type 2 DM with CBGs worsened by steroids - THC abuse - microcytic anemia - consideration for outpatient bariatric surgery consultation   Labs reviewed; HgbA1C: 8.1%, CBGs: 427, 343, 206 mg/dl, Na: 131 mmol/l, K: 5.4 mmol/l, Cl: 96 mmol/l, BUN: 28 mg/dl, creatinine: 1.22 mg/dl, GFR: 52  ml/min.   Medications reviewed; 100 mg colace BID, sliding scale novolog, 4 units novolog TID, 50 units semglee BID, 40 mg deltasone/day, 10 g lokelma x1 dose 9/27.     NUTRITION - FOCUSED PHYSICAL EXAM:  Unable to complete at this time.  Diet Order:   Diet Order             Diet Carb Modified Fluid consistency: Thin; Room service appropriate? Yes  Diet effective ____                   EDUCATION NEEDS:   Not appropriate for education at this time  Skin:  Skin Assessment: Reviewed RN Assessment  Last BM:  9/27 (type 6 x1)  Height:   Ht Readings from Last 1 Encounters:  04/23/21 4\' 11"  (1.499 m)    Weight:   Wt Readings from Last 1 Encounters:  04/23/21 99.8 kg     Estimated Nutritional Needs:  Kcal:  1700-1900 kcal Protein:  85-100 grams Fluid:  >/= 1.2 L/day      Jarome Matin, MS, RD, LDN, CNSC Inpatient Clinical Dietitian RD pager # available in AMION  After hours/weekend pager # available in [facility]

## 2021-04-24 NOTE — Evaluation (Signed)
Physical Therapy Evaluation Patient Details Name: Cassandra Burns MRN: [id number] DOB: 1965-11-30 Today's Date: 04/24/2021  History of Present Illness  55 y.o. female  presenting with SOB, dx of acute on chronic respiratory failure, hypertensive crisis, CHF.  Pt with medical history significant of DM; HTN; morbid obesity; sarcoidosis; and OSA on BIPAP.  Clinical Impression  Pt ambulated 200' without an assistive device, no loss of balance, SaO2 90-93% on 3L O2 walking. She is independent with mobility, no further acute PT indicated. Will sign off.        Recommendations for follow up therapy are one component of a multi-disciplinary discharge planning process, led by the attending physician.  Recommendations may be updated based on patient status, additional functional criteria and insurance authorization.  Follow Up Recommendations No PT follow up    Equipment Recommendations  None recommended by PT    Recommendations for Other Services       Precautions / Restrictions Precautions Precaution Comments: monitor O2 Restrictions Weight Bearing Restrictions: No      Mobility  Bed Mobility               General bed mobility comments: in recliner    Transfers Overall transfer level: Independent Equipment used: None             General transfer comment: does not need physical assist to power up to standing from recliner chair nor from bedside commode  Ambulation/Gait Ambulation/Gait assistance: Independent Gait Distance (Feet): 200 Feet Assistive device: None Gait Pattern/deviations: WFL(Within Functional Limits) Gait velocity: WFL   General Gait Details: SaO2 90-93% on 3L O2 walking, 95-97% on 3L at rest, steady, no loss of balance  Stairs            Wheelchair Mobility    Modified Rankin (Stroke Patients Only)       Balance Overall balance assessment: Independent                                           Pertinent  Vitals/Pain Pain Assessment: Faces Faces Pain Scale: Hurts little more Pain Location: abdominal cramps Pain Descriptors / Indicators: Cramping Pain Intervention(s): Limited activity within patient's tolerance;Monitored during session    Home Living Family/patient expects to be discharged to:: Private residence Living Arrangements: Alone Available Help at Discharge: Family;Available PRN/intermittently Type of Home: Apartment Home Access: Stairs to enter Entrance Stairs-Rails: None Entrance Stairs-Number of Steps: 3 Home Layout: One level Home Equipment: Cane - single point      Prior Function Level of Independence: Independent         Comments: walks without AD, used O2 prn at home; sponge bathed at home due to L wrist issues, drives     Hand Dominance   Dominant Hand: Right    Extremity/Trunk Assessment   Upper Extremity Assessment Upper Extremity Assessment: Overall WFL for tasks assessed    Lower Extremity Assessment Lower Extremity Assessment: Overall WFL for tasks assessed    Cervical / Trunk Assessment Cervical / Trunk Assessment: Normal Cervical / Trunk Exceptions: body habitus  Communication   Communication: No difficulties  Cognition Arousal/Alertness: Awake/alert Behavior During Therapy: WFL for tasks assessed/performed Overall Cognitive Status: Within Functional Limits for tasks assessed  General Comments      Exercises     Assessment/Plan    PT Assessment Patent does not need any further PT services  PT Problem List         PT Treatment Interventions      PT Goals (Current goals can be found in the Care Plan section)  Acute Rehab PT Goals Patient Stated Goal: get home, be with grand daughter PT Goal Formulation: All assessment and education complete, DC therapy    Frequency     Barriers to discharge        Co-evaluation PT/OT/SLP Co-Evaluation/Treatment: Yes Reason for  Co-Treatment: For patient/therapist safety;To address functional/ADL transfers PT goals addressed during session: Mobility/safety with mobility;Balance         AM-PAC PT "6 Clicks" Mobility  Outcome Measure Help needed turning from your back to your side while in a flat bed without using bedrails?: None Help needed moving from lying on your back to sitting on the side of a flat bed without using bedrails?: None Help needed moving to and from a bed to a chair (including a wheelchair)?: None Help needed standing up from a chair using your arms (e.g., wheelchair or bedside chair)?: None Help needed to walk in hospital room?: None Help needed climbing 3-5 steps with a railing? : None 6 Click Score: 24    End of Session Equipment Utilized During Treatment: Gait belt Activity Tolerance: Patient tolerated treatment well Patient left: in chair;with call bell/phone within reach Nurse Communication: Mobility status      Time: 2751-7001 PT Time Calculation (min) (ACUTE ONLY): 28 min   Charges:   PT Evaluation $PT Eval Moderate Complexity: 1 Mod         Philomena Doheny PT 04/24/2021  Acute Rehabilitation Services Pager [phone] Office [phone]

## 2021-04-24 NOTE — Progress Notes (Signed)
Inpatient Diabetes Program Recommendations  AACE/ADA: New Consensus Statement on Inpatient Glycemic Control (2015)  Target Ranges:  Prepandial:   less than 140 mg/dL      Peak postprandial:   less than 180 mg/dL (1-2 hours)      Critically ill patients:  140 - 180 mg/dL   Lab Results  Component Value Date   GLUCAP 377 (H) 04/24/2021   HGBA1C 8.1 (H) 04/23/2021    Review of Glycemic Control  Diabetes history: DM2 Outpatient Diabetes medications: Trulicity 3 mg weekly, metformin 1000 mg BID, Toujeo 50 units BID Current orders for Inpatient glycemic control: Semglee 50 units BID, Novolog 0-20 units TID with meals and 0-5 HS + 4 units TID  HgbA1C - 8.1% CBGs 343, 206, 377 mg/dL On Pred 40 mg QAM  Inpatient Diabetes Program Recommendations:    Increase Novolog to 8 units TID with meals if eating > 50% meal  Spoke with pt at bedside regarding her diabetes control and HgbA1C of 8.1%. Pt states "I did this to myself." Said she knows what to do, but recently has not been checking blood sugars several times/day and eats "whatever I want." States she needs to get back on track because she doesn't want to be in the hospital again. No hypoglycemia. Discussed importance of controlling blood sugars to prevent long and short term complications. Answered questions.   Will continue to follow glucose trends.   Thank you. Lorenda Peck, RD, LDN, CDE Inpatient Diabetes Coordinator [phone]

## 2021-04-25 ENCOUNTER — Inpatient Hospital Stay (HOSPITAL_COMMUNITY): Payer: Medicare HMO

## 2021-04-25 DIAGNOSIS — I10 Essential (primary) hypertension: Secondary | ICD-10-CM

## 2021-04-25 DIAGNOSIS — G4733 Obstructive sleep apnea (adult) (pediatric): Secondary | ICD-10-CM

## 2021-04-25 DIAGNOSIS — E875 Hyperkalemia: Secondary | ICD-10-CM

## 2021-04-25 LAB — CBC
HCT: 37.1 % (ref 36.0–46.0)
Hemoglobin: 11.1 g/dL — ABNORMAL LOW (ref 12.0–15.0)
MCH: 20.6 pg — ABNORMAL LOW (ref 26.0–34.0)
MCHC: 29.9 g/dL — ABNORMAL LOW (ref 30.0–36.0)
MCV: 68.7 fL — ABNORMAL LOW (ref 80.0–100.0)
Platelets: 275 10*3/uL (ref 150–400)
RBC: 5.4 MIL/uL — ABNORMAL HIGH (ref 3.87–5.11)
RDW: 21.5 % — ABNORMAL HIGH (ref 11.5–15.5)
WBC: 11 10*3/uL — ABNORMAL HIGH (ref 4.0–10.5)
nRBC: 0 % (ref 0.0–0.2)

## 2021-04-25 LAB — POTASSIUM
Potassium: 4.3 mmol/L (ref 3.5–5.1)
Potassium: 5.4 mmol/L — ABNORMAL HIGH (ref 3.5–5.1)
Potassium: 4.7 mmol/L (ref 3.5–5.1)

## 2021-04-25 LAB — CREATININE, URINE, RANDOM: Creatinine, Urine: 62.66 mg/dL

## 2021-04-25 LAB — BASIC METABOLIC PANEL
Anion gap: 9 (ref 5–15)
Anion gap: 8 (ref 5–15)
BUN: 46 mg/dL — ABNORMAL HIGH (ref 6–20)
BUN: 53 mg/dL — ABNORMAL HIGH (ref 6–20)
CO2: 26 mmol/L (ref 22–32)
CO2: 25 mmol/L (ref 22–32)
Calcium: 9.9 mg/dL (ref 8.9–10.3)
Calcium: 9.9 mg/dL (ref 8.9–10.3)
Chloride: 101 mmol/L (ref 98–111)
Chloride: 104 mmol/L (ref 98–111)
Creatinine, Ser: 1.35 mg/dL — ABNORMAL HIGH (ref 0.44–1.00)
Creatinine, Ser: 1.31 mg/dL — ABNORMAL HIGH (ref 0.44–1.00)
GFR, Estimated: 46 mL/min — ABNORMAL LOW (ref >60)
GFR, Estimated: 48 mL/min — ABNORMAL LOW (ref >60)
Glucose, Bld: 221 mg/dL — ABNORMAL HIGH (ref 70–99)
Glucose, Bld: 387 mg/dL — ABNORMAL HIGH (ref 70–99)
Potassium: 5.7 mmol/L — ABNORMAL HIGH (ref 3.5–5.1)
Potassium: 6.2 mmol/L — ABNORMAL HIGH (ref 3.5–5.1)
Sodium: 136 mmol/L (ref 135–145)
Sodium: 137 mmol/L (ref 135–145)

## 2021-04-25 LAB — OSMOLALITY, URINE: Osmolality, Ur: 551 mOsm/kg (ref 300–900)

## 2021-04-25 LAB — GLUCOSE, CAPILLARY
Glucose-Capillary: 108 mg/dL — ABNORMAL HIGH (ref 70–99)
Glucose-Capillary: 283 mg/dL — ABNORMAL HIGH (ref 70–99)
Glucose-Capillary: 390 mg/dL — ABNORMAL HIGH (ref 70–99)
Glucose-Capillary: 265 mg/dL — ABNORMAL HIGH (ref 70–99)

## 2021-04-25 LAB — NA AND K (SODIUM & POTASSIUM), RAND UR
Potassium Urine: 46 mmol/L
Sodium, Ur: 50 mmol/L

## 2021-04-25 MED ORDER — SODIUM ZIRCONIUM CYCLOSILICATE 5 G PO PACK
5.0000 g | PACK | Freq: Once | ORAL | Status: AC
  Administered 2021-04-25: 5 g via ORAL
  Filled 2021-04-25: qty 1

## 2021-04-25 MED ORDER — SODIUM ZIRCONIUM CYCLOSILICATE 10 G PO PACK
10.0000 g | PACK | Freq: Once | ORAL | Status: AC
  Administered 2021-04-25: 10 g via ORAL
  Filled 2021-04-25: qty 1

## 2021-04-25 NOTE — Progress Notes (Signed)
PROGRESS NOTE    NARAYA STONEBERG  UUV:[id number] DOB: Sep 03, 1965 DOA: 04/23/2021 PCP: [name], MD   Brief Narrative: XIARA KNISLEY is a 55 y.o. female with history of diabetes, hypertension, morbid obesity, sarcoidosis, chronic respiratory failure with hypoxia/OSA on home oxygen/BiPAP.  Patient presented secondary to progressive dyspnea, orthopnea, palpable urea, productive cough and wheezing.  Patient was also found to have evidence of hypertensive emergency requiring the initiation of nitroglycerin IV drip in addition to acute on chronic respiratory failure.  Patient given IV Lasix for concern of acute diastolic heart failure.  Patient's oxygen was weaned back to her baseline.  While admitted, she developed an AKI with associated hyperkalemia possibly related to IV Lasix diuresis.   Assessment & Plan:   Principal Problem:   Acute on chronic respiratory failure with hypoxia (HCC) Active Problems:   Essential hypertension   DM (diabetes mellitus) (HCC)   Morbid obesity (HCC)   OSA (obstructive sleep apnea)   HTN (hypertension)   Acute on chronic respiratory failure with hypoxia In setting of heart failure and asthma. Baseline of 2-3 l/min of oxygen during the day and BiPAP at night. Follows with pulmonology as an outpatient. Required 5 L/min of oxygen on admission and is now weaned to her baseline. -Continue oxygen during the day -Continue BiPAP qhs  Hypertensive emergency Patient is on amlodipine, labetalol, Hyzaar as an outpatient. BP of 224/107 on admission. Nitroglycerin drip started on admission and eventually weaned off. Now on oral medications. -Continue amlodipine, labetalol -Hyzaar held secondary to AKI and hyperkalemia -Continue Hydralazine prn  Asthma exacerbation Sarcoidosis Started on steroids. Wheezing appears to be improved.  Acute on chronic diastolic heart failure Patient given Lasix on admission which was discontinued secondary to AKI.  Respiratory status baseline. Weight of 101.9 kg today -Repeat chest x-ray -Continue to hold Lasix in setting of AKI  AKI Seems related to IV lasix. Now up to 1.35 -Repeat BMP this evening  Hyperkalemia Likely related to AKI. Given Lokelma 10 g x2 doses to date. -Repeat BMP this evening  Diabetes mellitus, type 2 Associated hyperglycemia. Hemoglobin A1C of 8.1%. On Trulicity, Metformin, Toujeo as an outpatient. --Continue Semglee 50 units BID, Novolog 6 units TID WC, SSI  THC abuse Counseled. Patient is motivated to quit.  Microcytic anemia Appears to be chronic. No bleeding. Outpatient follow-up.   Morbid obesty Body mass index is 45.37 kg/m.   DVT prophylaxis: Lovenox Code Status:   Code Status: Full Code Family Communication: None at bedside Disposition Plan: Discharge home likely in 24 hours pending stable potassium/creatinine   Consultants:  None  Procedures:  TRANSTHORACIC ECHOCARDIOGRAM (04/24/2021) IMPRESSIONS     1. Left ventricular ejection fraction, by estimation, is 65 to 70%. The  left ventricle has normal function. The left ventricle has no regional  wall motion abnormalities. There is severe left ventricular hypertrophy of  the anterior segment. Left  ventricular diastolic parameters are consistent with Grade I diastolic  dysfunction (impaired relaxation).   2. Right ventricular systolic function is normal. The right ventricular  size is normal.   3. The mitral valve is normal in structure. Trivial mitral valve  regurgitation. No evidence of mitral stenosis.   4. The aortic valve is normal in structure. Aortic valve regurgitation is  not visualized. No aortic stenosis is present.   5. The inferior vena cava is normal in size with greater than 50%  respiratory variability, suggesting right atrial pressure of 3 mmHg.   Comparison(s): No significant change  from prior study. Prior images  reviewed side by side.   Antimicrobials: None     Subjective: Breathing better. No issues overnight.  Objective: Vitals:   04/25/21 0808 04/25/21 0850 04/25/21 1031 04/25/21 1214  BP:   (!) 144/70   Pulse:      Resp:  (!) 22 16   Temp: 97.8 F (36.6 C)   97.6 F (36.4 C)  TempSrc: Oral   Oral  SpO2:  92% 92%   Weight:      Height:        Intake/Output Summary (Last 24 hours) at 04/25/2021 1414 Last data filed at 04/25/2021 1035 Gross per 24 hour  Intake 483 ml  Output 1000 ml  Net -517 ml   Filed Weights   04/23/21 1059 04/25/21 0700  Weight: 99.8 kg 101.9 kg    Examination:  General exam: Appears calm and comfortable Respiratory system: Coarse breath sounds. Respiratory effort normal. No wheezing. No rales. Cardiovascular system: S1 & S2 heard, RRR. No murmurs, rubs, gallops or clicks. Gastrointestinal system: Abdomen is nondistended, soft and nontender. No organomegaly or masses felt. Normal bowel sounds heard. Central nervous system: Alert and oriented. No focal neurological deficits. Musculoskeletal: Mild LE edema. No calf tenderness Skin: No cyanosis. No rashes Psychiatry: Judgement and insight appear normal. Mood & affect appropriate.     Data Reviewed: I have personally reviewed following labs and imaging studies  CBC Lab Results  Component Value Date   WBC 11.0 (H) 04/25/2021   RBC 5.40 (H) 04/25/2021   HGB 11.1 (L) 04/25/2021   HCT 37.1 04/25/2021   MCV 68.7 (L) 04/25/2021   MCH 20.6 (L) 04/25/2021   PLT 275 04/25/2021   MCHC 29.9 (L) 04/25/2021   RDW 21.5 (H) 04/25/2021   LYMPHSABS 2.0 11/07/2017   MONOABS 0.5 11/07/2017   EOSABS 0.1 11/07/2017   BASOSABS 0.0 16/04/9603     Last metabolic panel Lab Results  Component Value Date   NA 136 04/25/2021   K 5.4 (H) 04/25/2021   CL 101 04/25/2021   CO2 26 04/25/2021   BUN 46 (H) 04/25/2021   CREATININE 1.35 (H) 04/25/2021   GLUCOSE 221 (H) 04/25/2021   GFRNONAA 46 (L) 04/25/2021   GFRAA 77 09/07/2020   CALCIUM 9.9 04/25/2021   PHOS  4.3 07/12/2014   PROT 7.8 03/23/2021   ALBUMIN 3.6 01/27/2020   BILITOT 0.4 03/23/2021   ALKPHOS 135 (H) 01/27/2020   AST 15 03/23/2021   ALT 15 03/23/2021   ANIONGAP 9 04/25/2021    CBG (last 3)  Recent Labs    04/24/21 2117 04/25/21 0805 04/25/21 1213  GLUCAP 336* 108* 283*     GFR: Estimated Creatinine Clearance: 49.6 mL/min (A) (by C-G formula based on SCr of 1.35 mg/dL (H)).  Coagulation Profile: No results for input(s): INR, PROTIME in the last 168 hours.  Recent Results (from the past 240 hour(s))  Resp Panel by RT-PCR (Flu A&B, Covid) Nasopharyngeal Swab     Status: None   Collection Time: 04/23/21 11:56 AM   Specimen: Nasopharyngeal Swab; Nasopharyngeal(NP) swabs in vial transport medium  Result Value Ref Range Status   SARS Coronavirus 2 by RT PCR NEGATIVE NEGATIVE Final    Comment: (NOTE) SARS-CoV-2 target nucleic acids are NOT DETECTED.  The SARS-CoV-2 RNA is generally detectable in upper respiratory specimens during the acute phase of infection. The lowest concentration of SARS-CoV-2 viral copies this assay can detect is 138 copies/mL. A negative result does not preclude SARS-Cov-2 infection  and should not be used as the sole basis for treatment or other patient management decisions. A negative result may occur with  improper specimen collection/handling, submission of specimen other than nasopharyngeal swab, presence of viral mutation(s) within the areas targeted by this assay, and inadequate number of viral copies(<138 copies/mL). A negative result must be combined with clinical observations, patient history, and epidemiological information. The expected result is Negative.  Fact Sheet for Patients:  EntrepreneurPulse.com.au  Fact Sheet for Healthcare Providers:  IncredibleEmployment.be  This test is no t yet approved or cleared by the [place] FDA and  has been authorized for detection and/or diagnosis of  SARS-CoV-2 by FDA under an Emergency Use Authorization (EUA). This EUA will remain  in effect (meaning this test can be used) for the duration of the COVID-19 declaration under Section 564(b)(1) of the Act, 21 U.S.C.section 360bbb-3(b)(1), unless the authorization is terminated  or revoked sooner.       Influenza A by PCR NEGATIVE NEGATIVE Final   Influenza B by PCR NEGATIVE NEGATIVE Final    Comment: (NOTE) The Xpert Xpress SARS-CoV-2/FLU/RSV plus assay is intended as an aid in the diagnosis of influenza from Nasopharyngeal swab specimens and should not be used as a sole basis for treatment. Nasal washings and aspirates are unacceptable for Xpert Xpress SARS-CoV-2/FLU/RSV testing.  Fact Sheet for Patients: EntrepreneurPulse.com.au  Fact Sheet for Healthcare Providers: IncredibleEmployment.be  This test is not yet approved or cleared by the [place] FDA and has been authorized for detection and/or diagnosis of SARS-CoV-2 by FDA under an Emergency Use Authorization (EUA). This EUA will remain in effect (meaning this test can be used) for the duration of the COVID-19 declaration under Section 564(b)(1) of the Act, 21 U.S.C. section 360bbb-3(b)(1), unless the authorization is terminated or revoked.  Performed at [facility], [place] [address], [place], [place] [address]   MRSA Next Gen by PCR, Nasal     Status: None   Collection Time: 04/23/21  4:21 PM   Specimen: Nasal Mucosa; Nasal Swab  Result Value Ref Range Status   MRSA by PCR Next Gen NOT DETECTED NOT DETECTED Final    Comment: (NOTE) The GeneXpert MRSA Assay (FDA approved for NASAL specimens only), is one component of a comprehensive MRSA colonization surveillance program. It is not intended to diagnose MRSA infection nor to guide or monitor treatment for MRSA infections. Test performance is not FDA approved in patients less than 72 years old. Performed at  [facility] [place], [place] [address] [place], [place] [address]         Radiology Studies: ECHOCARDIOGRAM COMPLETE  Result Date: 04/24/2021    ECHOCARDIOGRAM REPORT   Patient Name:   SIBYL MIKULA Date of Exam: 04/24/2021 Medical Rec #:  [id number]             Height:       59.0 in Accession #:    [id number]            Weight:       220.0 lb Date of Birth:  August 06, 1965            BSA:          1.921 m Patient Age:    75 years              BP:           139/68 mmHg Patient Gender: F  HR:           96 bpm. Exam Location:  Inpatient Procedure: 2D Echo, Cardiac Doppler, Color Doppler and Intracardiac            Opacification Agent Indications:    CHF-Acute Diastolic 412.87 / O67.67  History:        Patient has prior history of Echocardiogram examinations, most                 recent 04/25/2020. Risk Factors:Hypertension, Diabetes and Sleep                 Apnea.  Sonographer:    Darlina Sicilian RDCS Referring Phys: [id number] JENNIFER YATES  Sonographer Comments: Technically difficult study due to poor echo windows. IMPRESSIONS  1. Left ventricular ejection fraction, by estimation, is 65 to 70%. The left ventricle has normal function. The left ventricle has no regional wall motion abnormalities. There is severe left ventricular hypertrophy of the anterior segment. Left ventricular diastolic parameters are consistent with Grade I diastolic dysfunction (impaired relaxation).  2. Right ventricular systolic function is normal. The right ventricular size is normal.  3. The mitral valve is normal in structure. Trivial mitral valve regurgitation. No evidence of mitral stenosis.  4. The aortic valve is normal in structure. Aortic valve regurgitation is not visualized. No aortic stenosis is present.  5. The inferior vena cava is normal in size with greater than 50% respiratory variability, suggesting right atrial pressure of 3 mmHg. Comparison(s): No significant change from prior study.  Prior images reviewed side by side. FINDINGS  Left Ventricle: Left ventricular ejection fraction, by estimation, is 65 to 70%. The left ventricle has normal function. The left ventricle has no regional wall motion abnormalities. Definity contrast agent was given IV to delineate the left ventricular  endocardial borders. The left ventricular internal cavity size was normal in size. There is severe left ventricular hypertrophy of the anterior segment. Left ventricular diastolic parameters are consistent with Grade I diastolic dysfunction (impaired relaxation). Right Ventricle: The right ventricular size is normal. No increase in right ventricular wall thickness. Right ventricular systolic function is normal. Left Atrium: Left atrial size was normal in size. Right Atrium: Right atrial size was normal in size. Pericardium: There is no evidence of pericardial effusion. Mitral Valve: The mitral valve is normal in structure. Trivial mitral valve regurgitation. No evidence of mitral valve stenosis. Tricuspid Valve: The tricuspid valve is normal in structure. Tricuspid valve regurgitation is not demonstrated. No evidence of tricuspid stenosis. Aortic Valve: The aortic valve is normal in structure. Aortic valve regurgitation is not visualized. No aortic stenosis is present. Pulmonic Valve: The pulmonic valve was normal in structure. Pulmonic valve regurgitation is not visualized. No evidence of pulmonic stenosis. Aorta: The aortic root is normal in size and structure. Venous: The inferior vena cava is normal in size with greater than 50% respiratory variability, suggesting right atrial pressure of 3 mmHg. IAS/Shunts: No atrial level shunt detected by color flow Doppler.  LEFT VENTRICLE PLAX 2D LVIDd:         3.50 cm  Diastology LVIDs:         2.70 cm  LV e' medial:    5.55 cm/s LV PW:         1.20 cm  LV E/e' medial:  13.3 LV IVS:        2.00 cm  LV e' lateral:   6.42 cm/s LVOT diam:     1.80 cm  LV E/e' lateral:  11.5 LV SV:          47 LV SV Index:   25 LVOT Area:     2.54 cm  LEFT ATRIUM             Index LA diam:        3.20 cm 1.67 cm/m LA Vol (A2C):   49.8 ml 25.93 ml/m LA Vol (A4C):   32.3 ml 16.82 ml/m LA Biplane Vol: 41.3 ml 21.50 ml/m  AORTIC VALVE LVOT Vmax:   106.00 cm/s LVOT Vmean:  80.500 cm/s LVOT VTI:    0.186 m  AORTA Ao Root diam: 2.80 cm Ao Asc diam:  3.00 cm MITRAL VALVE MV Area (PHT): 1.45 cm     SHUNTS MV Decel Time: 523 msec     Systemic VTI:  0.19 m MV E velocity: 73.70 cm/s   Systemic Diam: 1.80 cm MV A velocity: 110.00 cm/s MV E/A ratio:  0.67 Candee Furbish MD Electronically signed by Candee Furbish MD Signature Date/Time: 04/24/2021/3:30:04 PM    Final         Scheduled Meds:  (feeding supplement) PROSource Plus  30 mL Oral BID BM   amLODipine  10 mg Oral Daily   Chlorhexidine Gluconate Cloth  6 each Topical Daily   dextromethorphan-guaiFENesin  1 tablet Oral BID   docusate sodium  100 mg Oral BID   enoxaparin (LOVENOX) injection  40 mg Subcutaneous Q24H   feeding supplement (GLUCERNA SHAKE)  237 mL Oral Q24H   fluticasone  2 spray Each Nare QHS   gabapentin  800 mg Oral TID   insulin aspart  0-20 Units Subcutaneous TID WC   insulin aspart  0-5 Units Subcutaneous QHS   insulin aspart  6 Units Subcutaneous TID WC   insulin glargine-yfgn  50 Units Subcutaneous BID   ipratropium-albuterol  3 mL Nebulization Q6H   labetalol  100 mg Oral BID   mouth rinse  15 mL Mouth Rinse BID   mometasone-formoterol  2 puff Inhalation BID   multivitamin with minerals  1 tablet Oral Daily   predniSONE  40 mg Oral Q breakfast   sodium chloride flush  3 mL Intravenous Q12H   Continuous Infusions:   LOS: 2 days     [name], MD [facility] 04/25/2021, 2:14 PM  If 7PM-7AM, please contact night-coverage [email or web address]

## 2021-04-25 NOTE — Progress Notes (Signed)
Patient transferred to room 1418. On 2L [place]. Patient stable. NT at bedside.

## 2021-04-25 NOTE — Plan of Care (Signed)

## 2021-04-26 ENCOUNTER — Inpatient Hospital Stay (HOSPITAL_COMMUNITY): Payer: Medicare HMO

## 2021-04-26 LAB — GLUCOSE, CAPILLARY
Glucose-Capillary: 140 mg/dL — ABNORMAL HIGH (ref 70–99)
Glucose-Capillary: 47 mg/dL — ABNORMAL LOW (ref 70–99)
Glucose-Capillary: 64 mg/dL — ABNORMAL LOW (ref 70–99)
Glucose-Capillary: 76 mg/dL (ref 70–99)
Glucose-Capillary: 196 mg/dL — ABNORMAL HIGH (ref 70–99)
Glucose-Capillary: 80 mg/dL (ref 70–99)
Glucose-Capillary: 150 mg/dL — ABNORMAL HIGH (ref 70–99)

## 2021-04-26 LAB — EXPECTORATED SPUTUM ASSESSMENT W GRAM STAIN, RFLX TO RESP C

## 2021-04-26 LAB — BASIC METABOLIC PANEL
Anion gap: 5 (ref 5–15)
BUN: 50 mg/dL — ABNORMAL HIGH (ref 6–20)
CO2: 28 mmol/L (ref 22–32)
Calcium: 9.6 mg/dL (ref 8.9–10.3)
Chloride: 102 mmol/L (ref 98–111)
Creatinine, Ser: 1.11 mg/dL — ABNORMAL HIGH (ref 0.44–1.00)
GFR, Estimated: 59 mL/min — ABNORMAL LOW (ref >60)
Glucose, Bld: 50 mg/dL — ABNORMAL LOW (ref 70–99)
Potassium: 4.3 mmol/L (ref 3.5–5.1)
Sodium: 135 mmol/L (ref 135–145)

## 2021-04-26 MED ORDER — GUAIFENESIN-DM 100-10 MG/5ML PO SYRP
5.0000 mL | ORAL_SOLUTION | ORAL | Status: DC | PRN
  Administered 2021-04-26: 5 mL via ORAL
  Filled 2021-04-26: qty 10

## 2021-04-26 MED ORDER — INSULIN GLARGINE-YFGN 100 UNIT/ML ~~LOC~~ SOLN
40.0000 [IU] | Freq: Two times a day (BID) | SUBCUTANEOUS | Status: DC
  Administered 2021-04-26 – 2021-04-27 (×2): 40 [IU] via SUBCUTANEOUS
  Filled 2021-04-26 (×3): qty 0.4

## 2021-04-26 MED ORDER — AMOXICILLIN-POT CLAVULANATE 500-125 MG PO TABS
1.0000 | ORAL_TABLET | Freq: Three times a day (TID) | ORAL | Status: DC
  Administered 2021-04-26 – 2021-04-27 (×4): 500 mg via ORAL
  Filled 2021-04-26 (×4): qty 1

## 2021-04-26 MED ORDER — PREDNISONE 20 MG PO TABS
40.0000 mg | ORAL_TABLET | Freq: Every day | ORAL | Status: DC
  Administered 2021-04-26 – 2021-04-27 (×2): 40 mg via ORAL
  Filled 2021-04-26 (×2): qty 2

## 2021-04-26 MED ORDER — AZITHROMYCIN 250 MG PO TABS
500.0000 mg | ORAL_TABLET | Freq: Every day | ORAL | Status: DC
  Administered 2021-04-26 – 2021-04-27 (×2): 500 mg via ORAL
  Filled 2021-04-26 (×2): qty 2

## 2021-04-26 MED ORDER — INSULIN GLARGINE-YFGN 100 UNIT/ML ~~LOC~~ SOLN
45.0000 [IU] | Freq: Two times a day (BID) | SUBCUTANEOUS | Status: DC
  Filled 2021-04-26: qty 0.45

## 2021-04-26 MED ORDER — SODIUM CHLORIDE 0.9 % IV SOLN
1.0000 g | INTRAVENOUS | Status: DC
  Administered 2021-04-26: 1 g via INTRAVENOUS
  Filled 2021-04-26: qty 10

## 2021-04-26 NOTE — Progress Notes (Signed)
Mobility Specialist - Progress Note    04/26/21 1150  Oxygen Therapy  SpO2 (!) 88 %  O2 Device Nasal Cannula  O2 Flow Rate (L/min) 3 L/min  Mobility  Activity Ambulated in hall  Level of Assistance Independent after set-up  Assistive Device None  Distance Ambulated (ft) 300 ft  Mobility Ambulated independently in hallway  Mobility Response Tolerated well  Mobility performed by Mobility specialist;Family member  $Mobility charge 1 Mobility    Pre-mobility: 87 HR, 96% SpO2 During mobility: 100 HR, 88% SpO2 Post-mobility: 100 HR, 94% SPO2  Upon entry pt was agreeable to ambulate and required to assistive device to walk 300 ft in hallway. Pt was connected to 3L of O2 while walking and required 2 standing rest breaks during session to help with SOB. Pt did not c/o of pain or dizziness and returned to room following session. Left in recliner with family in room.   [place] Specialist Acute [facility] Services Phone: [phone] 04/26/21, 11:52 AM

## 2021-04-26 NOTE — Progress Notes (Signed)
PROGRESS NOTE    Cassandra Burns  FMB:[id number] DOB: 1966/03/13 DOA: 04/23/2021 PCP: [name], MD   Brief Narrative: Cassandra Burns is a 55 y.o. female with history of diabetes, hypertension, morbid obesity, sarcoidosis, chronic respiratory failure with hypoxia/OSA on home oxygen/BiPAP.  Patient presented secondary to progressive dyspnea, orthopnea, palpable urea, productive cough and wheezing.  Patient was also found to have evidence of hypertensive emergency requiring the initiation of nitroglycerin IV drip in addition to acute on chronic respiratory failure.  Patient given IV Lasix for concern of acute diastolic heart failure.  Patient's oxygen was weaned back to her baseline.  While admitted, she developed an AKI with associated hyperkalemia possibly related to IV Lasix diuresis.   Assessment & Plan:   Principal Problem:   Acute on chronic respiratory failure with hypoxia (HCC) Active Problems:   Essential hypertension   DM (diabetes mellitus) (HCC)   Morbid obesity (HCC)   OSA (obstructive sleep apnea)   HTN (hypertension)   Acute on chronic respiratory failure with hypoxia In setting of heart failure and asthma. Baseline of 2-3 l/min of oxygen during the day and BiPAP at night. Follows with pulmonology as an outpatient. Required 5 L/min of oxygen on admission and is now weaned to her baseline. -Continue oxygen during the day -Continue BiPAP qhs  Hypertensive emergency Patient is on amlodipine, labetalol, Hyzaar as an outpatient. BP of 224/107 on admission. Nitroglycerin drip started on admission and eventually weaned off. Now on oral medications. -Continue amlodipine, labetalol -Hyzaar held secondary to AKI and hyperkalemia -Continue Hydralazine prn  Asthma exacerbation Sarcoidosis Started on steroids. Wheezing initially improved. Slightly worsened today. Worsening dyspnea this afternoon. -Continue scheduled Duoneb -Prednisone 40 mg daily  Acute on  chronic diastolic heart failure Patient given Lasix on admission which was discontinued secondary to AKI. Respiratory status baseline. Weight of 101.9 kg > 99.5 kg. UOP of 2.3 L over the last 24 hours. Chest x-ray today with development of pulmonary edema. AKI improved. -Will start with Lasix 20 mg IV and watch kidney function carefully  Possible developing pneumonia Chest x-ray without specific infiltrate but clinical symptoms concerning. -Empiric Augmentin/Azithromycin -Sputum culture  AKI Seems related to IV lasix. Peak of 1.35 and now trending down.  Hyperkalemia Likely related to AKI. Given Lokelma 10 g x3 doses to date. Now appears resolved. -BMP in AM  Diabetes mellitus, type 2 Associated hyperglycemia and hypoglycemia. Hemoglobin A1C of 8.1%. On Trulicity, Metformin, Toujeo as an outpatient. --Decrease to Semglee 40 units BID, continue Novolog 6 units TID WC, SSI  THC abuse Counseled. Patient is motivated to quit.  Microcytic anemia Appears to be chronic. No bleeding. Outpatient follow-up.   Morbid obesty Body mass index is 44.31 kg/m.   DVT prophylaxis: Lovenox Code Status:   Code Status: Full Code Family Communication: None at bedside Disposition Plan: Discharge home likely in 24 hours pending improvement of respiratory status   Consultants:  None  Procedures:  TRANSTHORACIC ECHOCARDIOGRAM (04/24/2021) IMPRESSIONS     1. Left ventricular ejection fraction, by estimation, is 65 to 70%. The  left ventricle has normal function. The left ventricle has no regional  wall motion abnormalities. There is severe left ventricular hypertrophy of  the anterior segment. Left  ventricular diastolic parameters are consistent with Grade I diastolic  dysfunction (impaired relaxation).   2. Right ventricular systolic function is normal. The right ventricular  size is normal.   3. The mitral valve is normal in structure. Trivial mitral valve  regurgitation.  No evidence of  mitral stenosis.   4. The aortic valve is normal in structure. Aortic valve regurgitation is  not visualized. No aortic stenosis is present.   5. The inferior vena cava is normal in size with greater than 50%  respiratory variability, suggesting right atrial pressure of 3 mmHg.   Comparison(s): No significant change from prior study. Prior images  reviewed side by side.   Antimicrobials: None    Subjective: Breathing is stable but patient reports increased sputum production. Having associated dyspnea. No chest pain. Afebrile. This afternoon, patient developed worsening dyspnea which improved with application of BiPAP. Hypoglycemia this morning with a CBG of 50 and recurrent hypoglycemia with a CBG of 47 this late morning  Objective: Vitals:   04/26/21 1150 04/26/21 1211 04/26/21 1406 04/26/21 1430  BP:  132/66  138/81  Pulse:  79  79  Resp:  19  18  Temp:  (!) 97.5 F (36.4 C)    TempSrc:  Oral    SpO2: (!) 88% 96% 90%   Weight:      Height:        Intake/Output Summary (Last 24 hours) at 04/26/2021 1603 Last data filed at 04/26/2021 0437 Gross per 24 hour  Intake 980 ml  Output 1650 ml  Net -670 ml    Filed Weights   04/23/21 1059 04/25/21 0700 04/26/21 0436  Weight: 99.8 kg 101.9 kg 99.5 kg    Examination:  General exam: Appears calm and comfortable Respiratory system: Coarse breath sounds. Respiratory effort normal. No wheezing. No rales. Cardiovascular system: S1 & S2 heard, RRR. No murmurs, rubs, gallops or clicks. Gastrointestinal system: Abdomen is nondistended, soft and nontender. No organomegaly or masses felt. Normal bowel sounds heard. Central nervous system: Alert and oriented. No focal neurological deficits. Musculoskeletal: Mild LE edema. No calf tenderness Skin: No cyanosis. No rashes Psychiatry: Judgement and insight appear normal. Mood & affect appropriate.     Data Reviewed: I have personally reviewed following labs and imaging  studies  CBC Lab Results  Component Value Date   WBC 11.0 (H) 04/25/2021   RBC 5.40 (H) 04/25/2021   HGB 11.1 (L) 04/25/2021   HCT 37.1 04/25/2021   MCV 68.7 (L) 04/25/2021   MCH 20.6 (L) 04/25/2021   PLT 275 04/25/2021   MCHC 29.9 (L) 04/25/2021   RDW 21.5 (H) 04/25/2021   LYMPHSABS 2.0 11/07/2017   MONOABS 0.5 11/07/2017   EOSABS 0.1 11/07/2017   BASOSABS 0.0 84/69/6295     Last metabolic panel Lab Results  Component Value Date   NA 135 04/26/2021   K 4.3 04/26/2021   CL 102 04/26/2021   CO2 28 04/26/2021   BUN 50 (H) 04/26/2021   CREATININE 1.11 (H) 04/26/2021   GLUCOSE 50 (L) 04/26/2021   GFRNONAA 59 (L) 04/26/2021   GFRAA 77 09/07/2020   CALCIUM 9.6 04/26/2021   PHOS 4.3 07/12/2014   PROT 7.8 03/23/2021   ALBUMIN 3.6 01/27/2020   BILITOT 0.4 03/23/2021   ALKPHOS 135 (H) 01/27/2020   AST 15 03/23/2021   ALT 15 03/23/2021   ANIONGAP 5 04/26/2021    CBG (last 3)  Recent Labs    04/26/21 1153 04/26/21 1206 04/26/21 1330  GLUCAP 64* 76 196*      GFR: Estimated Creatinine Clearance: 59.4 mL/min (A) (by C-G formula based on SCr of 1.11 mg/dL (H)).  Coagulation Profile: No results for input(s): INR, PROTIME in the last 168 hours.  Recent Results (from the past 240 hour(s))  Resp Panel by RT-PCR (Flu A&B, Covid) Nasopharyngeal Swab     Status: None   Collection Time: 04/23/21 11:56 AM   Specimen: Nasopharyngeal Swab; Nasopharyngeal(NP) swabs in vial transport medium  Result Value Ref Range Status   SARS Coronavirus 2 by RT PCR NEGATIVE NEGATIVE Final    Comment: (NOTE) SARS-CoV-2 target nucleic acids are NOT DETECTED.  The SARS-CoV-2 RNA is generally detectable in upper respiratory specimens during the acute phase of infection. The lowest concentration of SARS-CoV-2 viral copies this assay can detect is 138 copies/mL. A negative result does not preclude SARS-Cov-2 infection and should not be used as the sole basis for treatment or other patient  management decisions. A negative result may occur with  improper specimen collection/handling, submission of specimen other than nasopharyngeal swab, presence of viral mutation(s) within the areas targeted by this assay, and inadequate number of viral copies(<138 copies/mL). A negative result must be combined with clinical observations, patient history, and epidemiological information. The expected result is Negative.  Fact Sheet for Patients:  EntrepreneurPulse.com.au  Fact Sheet for Healthcare Providers:  IncredibleEmployment.be  This test is no t yet approved or cleared by the [place] FDA and  has been authorized for detection and/or diagnosis of SARS-CoV-2 by FDA under an Emergency Use Authorization (EUA). This EUA will remain  in effect (meaning this test can be used) for the duration of the COVID-19 declaration under Section 564(b)(1) of the Act, 21 U.S.C.section 360bbb-3(b)(1), unless the authorization is terminated  or revoked sooner.       Influenza A by PCR NEGATIVE NEGATIVE Final   Influenza B by PCR NEGATIVE NEGATIVE Final    Comment: (NOTE) The Xpert Xpress SARS-CoV-2/FLU/RSV plus assay is intended as an aid in the diagnosis of influenza from Nasopharyngeal swab specimens and should not be used as a sole basis for treatment. Nasal washings and aspirates are unacceptable for Xpert Xpress SARS-CoV-2/FLU/RSV testing.  Fact Sheet for Patients: EntrepreneurPulse.com.au  Fact Sheet for Healthcare Providers: IncredibleEmployment.be  This test is not yet approved or cleared by the [place] FDA and has been authorized for detection and/or diagnosis of SARS-CoV-2 by FDA under an Emergency Use Authorization (EUA). This EUA will remain in effect (meaning this test can be used) for the duration of the COVID-19 declaration under Section 564(b)(1) of the Act, 21 U.S.C. section 360bbb-3(b)(1),  unless the authorization is terminated or revoked.  Performed at [facility], [place] [address] [place], [place] [address]   MRSA Next Gen by PCR, Nasal     Status: None   Collection Time: 04/23/21  4:21 PM   Specimen: Nasal Mucosa; Nasal Swab  Result Value Ref Range Status   MRSA by PCR Next Gen NOT DETECTED NOT DETECTED Final    Comment: (NOTE) The GeneXpert MRSA Assay (FDA approved for NASAL specimens only), is one component of a comprehensive MRSA colonization surveillance program. It is not intended to diagnose MRSA infection nor to guide or monitor treatment for MRSA infections. Test performance is not FDA approved in patients less than 40 years old. Performed at [facility], [place] [address], [place], [place] [address]   Expectorated Sputum Assessment w Gram Stain, Rflx to Resp Cult     Status: None   Collection Time: 04/26/21 11:36 AM   Specimen: Expectorated Sputum  Result Value Ref Range Status   Specimen Description EXPECTORATED SPUTUM  Final   Special Requests NONE  Final   Sputum evaluation   Final  THIS SPECIMEN IS ACCEPTABLE FOR SPUTUM CULTURE Performed at [facility], [place] [address], [place], [place] [address]    Report Status 04/26/2021 FINAL  Final         Radiology Studies: DG CHEST PORT 1 VIEW  Result Date: 04/26/2021 CLINICAL DATA:  Productive cough, shortness of breath EXAM: PORTABLE CHEST 1 VIEW COMPARISON:  04/25/2021 FINDINGS: Stable cardiomegaly. Mild pulmonary vascular congestion. Mildly prominent bilateral perihilar interstitial markings, similar to prior. No new airspace consolidation. No appreciable pleural fluid collection. No pneumothorax. IMPRESSION: Cardiomegaly with mild pulmonary vascular congestion. Mild bilateral perihilar interstitial prominence likely reflecting mild edema. Electronically Signed   By: Davina Poke D.O.   On: 04/26/2021 13:49   DG CHEST PORT 1 VIEW  Result  Date: 04/25/2021 CLINICAL DATA:  Pulmonary edema EXAM: PORTABLE CHEST 1 VIEW COMPARISON:  04/23/2021 FINDINGS: Bilateral mild interstitial thickening. No focal consolidation. No pleural effusion or pneumothorax. Stable cardiomegaly. No acute osseous abnormality. IMPRESSION: Cardiomegaly with pulmonary vascular congestion. Electronically Signed   By: Kathreen Devoid M.D.   On: 04/25/2021 17:03        Scheduled Meds:  (feeding supplement) PROSource Plus  30 mL Oral BID BM   amLODipine  10 mg Oral Daily   amoxicillin-clavulanate  1 tablet Oral TID   azithromycin  500 mg Oral Daily   docusate sodium  100 mg Oral BID   enoxaparin (LOVENOX) injection  40 mg Subcutaneous Q24H   feeding supplement (GLUCERNA SHAKE)  237 mL Oral Q24H   fluticasone  2 spray Each Nare QHS   gabapentin  800 mg Oral TID   insulin aspart  0-20 Units Subcutaneous TID WC   insulin aspart  0-5 Units Subcutaneous QHS   insulin aspart  6 Units Subcutaneous TID WC   insulin glargine-yfgn  50 Units Subcutaneous BID   ipratropium-albuterol  3 mL Nebulization Q6H   labetalol  100 mg Oral BID   mouth rinse  15 mL Mouth Rinse BID   mometasone-formoterol  2 puff Inhalation BID   multivitamin with minerals  1 tablet Oral Daily   sodium chloride flush  3 mL Intravenous Q12H   Continuous Infusions:   LOS: 3 days     [name], MD [facility] 04/26/2021, 4:03 PM  If 7PM-7AM, please contact night-coverage [email or web address]

## 2021-04-26 NOTE — Progress Notes (Signed)
Inpatient Diabetes Program Recommendations  AACE/ADA: New Consensus Statement on Inpatient Glycemic Control (2015)  Target Ranges:  Prepandial:   less than 140 mg/dL      Peak postprandial:   less than 180 mg/dL (1-2 hours)      Critically ill patients:  140 - 180 mg/dL   Lab Results  Component Value Date   GLUCAP 140 (H) 04/26/2021   HGBA1C 8.1 (H) 04/23/2021    Review of Glycemic Control  Diabetes history: DM2 Current orders for Inpatient glycemic control: Semglee 50 BID, Novolog 0-20 units TID with meals and 0-5 HS + 4 units TID  Hypo this am of 50 mg/dL.  Inpatient Diabetes Program Recommendations:    Reduce Semglee to 45 units BID Reduce Novolog to 0-15 units TID with meals and 0-5 HS  See if pt has questions prior to discharge.   Thank you. Lorenda Peck, RD, LDN, CDE Inpatient Diabetes Coordinator [phone]

## 2021-04-26 NOTE — Plan of Care (Signed)
Plan of care reviewed with pt and son at bedside. Pt alert and oriented and in no acute distress. Tele with NSR. On baseline O2, but did verbalizes some shortness of breath mid day. MD paged to make aware. Pt given flutter valve and educated on use. Pt on BiPAP during sleep and intermittently throughout shift. Sputum culture collected.  Pt blood sugar also checked AC/HS. Pt had episode of hypoglycemia to 40's corrected with PO juice intake. MD paged to make aware and blood sugar monitored carefully throughout shift and pt educated about signs and symptoms of hypoglycemia and treatment. Pt PIVs both infiltrated; New PIV obtained by IV team using ultrasound. Pt has no c/o pain this shift.  OOB to bathroom independently and ambulated in hall with PT. Pt safety maintained. Will continue to monitor and reassess    Problem: Education: Goal: Ability to demonstrate management of disease process will improve 04/26/2021 1826 by Barrington Ellison, RN Outcome: Progressing 04/26/2021 1826 by Barrington Ellison, RN Outcome: Progressing Goal: Ability to verbalize understanding of medication therapies will improve 04/26/2021 1826 by Barrington Ellison, RN Outcome: Progressing 04/26/2021 1826 by Barrington Ellison, RN Outcome: Progressing Goal: Individualized Educational Video(s) 04/26/2021 1826 by Barrington Ellison, RN Outcome: Progressing 04/26/2021 1826 by Barrington Ellison, RN Outcome: Progressing   Problem: Activity: Goal: Capacity to carry out activities will improve 04/26/2021 1826 by Barrington Ellison, RN Outcome: Progressing 04/26/2021 1826 by Barrington Ellison, RN Outcome: Progressing   Problem: Cardiac: Goal: Ability to achieve and maintain adequate cardiopulmonary perfusion will improve 04/26/2021 1826 by Barrington Ellison, RN Outcome: Progressing 04/26/2021 1826 by Barrington Ellison, RN Outcome: Progressing

## 2021-04-27 LAB — MAGNESIUM: Magnesium: 2.2 mg/dL (ref 1.7–2.4)

## 2021-04-27 LAB — GLUCOSE, CAPILLARY
Glucose-Capillary: 141 mg/dL — ABNORMAL HIGH (ref 70–99)
Glucose-Capillary: 277 mg/dL — ABNORMAL HIGH (ref 70–99)

## 2021-04-27 LAB — BASIC METABOLIC PANEL
Anion gap: 6 (ref 5–15)
BUN: 44 mg/dL — ABNORMAL HIGH (ref 6–20)
CO2: 30 mmol/L (ref 22–32)
Calcium: 9.5 mg/dL (ref 8.9–10.3)
Chloride: 103 mmol/L (ref 98–111)
Creatinine, Ser: 1.15 mg/dL — ABNORMAL HIGH (ref 0.44–1.00)
GFR, Estimated: 56 mL/min — ABNORMAL LOW (ref >60)
Glucose, Bld: 146 mg/dL — ABNORMAL HIGH (ref 70–99)
Potassium: 5.8 mmol/L — ABNORMAL HIGH (ref 3.5–5.1)
Sodium: 139 mmol/L (ref 135–145)

## 2021-04-27 LAB — POTASSIUM: Potassium: 4.7 mmol/L (ref 3.5–5.1)

## 2021-04-27 MED ORDER — TOUJEO SOLOSTAR 300 UNIT/ML ~~LOC~~ SOPN: 40.0000 [IU] | PEN_INJECTOR | Freq: Two times a day (BID) | SUBCUTANEOUS | Status: AC

## 2021-04-27 MED ORDER — AZITHROMYCIN 500 MG PO TABS: 500.0000 mg | ORAL_TABLET | Freq: Every day | ORAL | 0 refills | Status: AC

## 2021-04-27 MED ORDER — AMOXICILLIN-POT CLAVULANATE 500-125 MG PO TABS: 1.0000 | ORAL_TABLET | Freq: Three times a day (TID) | ORAL | 0 refills | Status: AC

## 2021-04-27 MED ORDER — PREDNISONE 20 MG PO TABS: 40.0000 mg | ORAL_TABLET | Freq: Every day | ORAL | 0 refills | Status: AC

## 2021-04-27 MED ORDER — FUROSEMIDE 20 MG PO TABS: 20.0000 mg | ORAL_TABLET | Freq: Every day | ORAL | 0 refills | Status: DC

## 2021-04-27 MED ORDER — IPRATROPIUM-ALBUTEROL 0.5-2.5 (3) MG/3ML IN SOLN: 3.0000 mL | Freq: Two times a day (BID) | RESPIRATORY_TRACT | Status: DC

## 2021-04-27 NOTE — Discharge Instructions (Addendum)
Cassandra Burns,  You were in the hospital with a few issues.  Heart failure: heart failure led to fluid building up in/around your lungs. You were given Lasix with some improvement. I will discharge you on Lasix. Please follow-up with the cardiologist as an outpatient. Please start Lasix, which is a low dose. Please call your PCP if you notice a 3 lb weight gain within 1-2 days for recommendations. Asthma exacerbation: you have improved with steroids and breathing treatments. Please continue on discharge. Possible pneumonia: you had some productive coughing. Thankfully no fevers. It is possible you are developing a pneumonia, so I have started you on Augmentin and Azithromycin, which are antibiotics. Please take as prescribed.  [name], MD [facility]

## 2021-04-27 NOTE — Progress Notes (Signed)
   04/27/21 1351  Mobility  Activity Ambulated in hall  Level of Assistance Modified independent, requires aide device or extra time  Assistive Device None  Distance Ambulated (ft) 550 ft  Mobility Ambulated with assistance in hallway  Mobility Response Tolerated well  Mobility performed by Mobility specialist  $Mobility charge 1 Mobility   Pt agreeable to mobilize today. Ambulated about 551ft in hallway on 2L [place] with no device, tolerated well. O2 sat dropped to 89% while ambulating. Took 2 rest breaks to recover. Left pt in chair with call bell at side. Notified RN of session.    [place] Specialist Acute Rehab Services Office: [phone]

## 2021-04-27 NOTE — Plan of Care (Signed)
Plan of care and discharge education completed with pt. Pt alert and oriented and in no acute distress. Pt discharged with home oxygen, per pt baseline. Pt PIV removed prior to discharge. Pt to be discharged home with self care/ family. Pt's family to provide transportation to home. Pt verbalizes understanding and cooperation with all discharge education and follow up instructions. Pt safety maintained.   Problem: Education: Goal: Ability to demonstrate management of disease process will improve Outcome: Adequate for Discharge Goal: Ability to verbalize understanding of medication therapies will improve Outcome: Adequate for Discharge Goal: Individualized Educational Video(s) Outcome: Adequate for Discharge   Problem: Activity: Goal: Capacity to carry out activities will improve Outcome: Adequate for Discharge   Problem: Cardiac: Goal: Ability to achieve and maintain adequate cardiopulmonary perfusion will improve Outcome: Adequate for Discharge

## 2021-04-27 NOTE — Discharge Summary (Signed)
Physician Discharge Summary  Cassandra Burns YJE:[id number] DOB: October 20, 1965 DOA: 04/23/2021  PCP: [name], MD  Admit date: 04/23/2021 Discharge date: 04/27/2021  Admitted From: Home Disposition: Home  Recommendations for Outpatient Follow-up:  Follow up with PCP in 1 week Recommend cardiology referral for heart failure Please obtain BMP/CBC in one week Please follow up on the following pending results: None  Home Health: None Equipment/Devices: Oxygen  Discharge Condition: Stable CODE STATUS: Full code Diet recommendation: Heart healthy/Carb modified   Brief/Interim Summary:  Admission HPI written by Karmen Bongo, MD   HPI: Cassandra Burns is a 55 y.o. female with medical history significant of DM; HTN; morbid obesity; sarcoidosis; and OSA on BIPAP presenting with SOB.  She reports that she has had DOE, orthopnea, and polyuria.  She felt like she must be in heart failure.  She has had cough that is mildly productive of greenish sputum.  No LE edema.  She uses BIPAP at home and tried to use the oxygen from her BIPAP without improvement.  She usually uses 2-3L home O2 but increased that as well without improvement.  + wheezing.   Hospital course:  Acute on chronic respiratory failure with hypoxia In setting of heart failure and asthma. Baseline of 2-3 l/min of oxygen during the day and BiPAP at night. Follows with pulmonology as an outpatient. Required 5 L/min of oxygen on admission and is now weaned to her baseline. -Continue oxygen during the day -Continue BiPAP qhs   Hypertensive emergency Patient is on amlodipine, labetalol, Hyzaar as an outpatient. BP of 224/107 on admission. Nitroglycerin drip started on admission and eventually weaned off. Now on oral medications. -Continue amlodipine, labetalol -Hyzaar held secondary to AKI and hyperkalemia -Continue Hydralazine prn   Asthma exacerbation Sarcoidosis Started on steroids and managed  additionally with Duoneb breathing treatments with improvement. Patient discharged to complete a 5 day course of antibiotics and to continue bronchodilator therapy.   Acute on chronic diastolic heart failure Patient given Lasix on admission which was discontinued secondary to AKI. Respiratory status baseline. Transthoracic Echocardiogram significant for severe LVH and grade 1 diastolic heart dysfunction. Chest x-ray on 9/29 with development of pulmonary edema. AKI improved. Discharged on Lasix 20 mg daily. Weight on discharge of 219 lbs (99.5 kg). Recommendation for cardiology follow-up.   Possible developing pneumonia Chest x-ray without specific infiltrate but clinical symptoms concerning. Patient started on empiric Augmentin and Azithromycin. Sputum culture pending on discharge, however preliminarily positive for H. Influenzae. Discharge to complete a 7 day course of Augmentin and 5 day course of Azithromycin.   AKI Seems related to IV lasix. Peak of 1.35 and now trending down.   Hyperkalemia Likely related to AKI. Given Lokelma 10 g x3 doses to date. Hyperkalemia on day of discharge complicated by hemolyzed sample; repeat potassium normal.   Diabetes mellitus, type 2 Associated hyperglycemia and hypoglycemia. Hemoglobin A1C of 8.1%. On Trulicity, Metformin, Toujeo as an outpatient. Decreased to Toujeo 40 units BID on discharge.   THC abuse Counseled. Patient is motivated to quit.   Microcytic anemia Appears to be chronic. No bleeding. Outpatient follow-up.    Morbid obesty Body mass index is 44.31 kg/m. Discussed weight loss. Patient is motivated. Will need PCP follow-up to assist with weight loss goals.  Discharge Diagnoses:  Principal Problem:   Acute on chronic respiratory failure with hypoxia (HCC) Active Problems:   Essential hypertension   DM (diabetes mellitus) ([facility])   Morbid obesity (HCC)   OSA (obstructive  sleep apnea)   HTN (hypertension)    Discharge  Instructions  Discharge Instructions     Diet - low sodium heart healthy   Complete by: As directed    Increase activity slowly   Complete by: As directed       Allergies as of 04/27/2021   No Known Allergies      Medication List     STOP taking these medications    cyclobenzaprine 10 MG tablet Commonly known as: FLEXERIL       TAKE these medications    albuterol (2.5 MG/3ML) 0.083% nebulizer solution Commonly known as: PROVENTIL Take 3 mLs (2.5 mg total) by nebulization every 6 (six) hours as needed for wheezing or shortness of breath.   albuterol 108 (90 Base) MCG/ACT inhaler Commonly known as: VENTOLIN HFA Inhale 1-2 puffs into the lungs every 6 (six) hours as needed for wheezing or shortness of breath.   amLODipine 10 MG tablet Commonly known as: NORVASC Take 1 tablet (10 mg total) by mouth daily. What changed: when to take this   amoxicillin-clavulanate 500-125 MG tablet Commonly known as: AUGMENTIN Take 1 tablet (500 mg total) by mouth 3 (three) times daily for 6 days.   azithromycin 500 MG tablet Commonly known as: ZITHROMAX Take 1 tablet (500 mg total) by mouth daily for 3 days. Start taking on: April 28, 2021   budesonide-formoterol 160-4.5 MCG/ACT inhaler Commonly known as: SYMBICORT Inhale 2 puffs into the lungs 2 (two) times daily.   fluticasone 50 MCG/ACT nasal spray Commonly known as: FLONASE Place 1 spray into both nostrils daily as needed for allergies or rhinitis.   furosemide 20 MG tablet Commonly known as: Lasix Take 1 tablet (20 mg total) by mouth daily.   gabapentin 800 MG tablet Commonly known as: NEURONTIN Take 800 mg by mouth 3 (three) times daily.   ibuprofen 800 MG tablet Commonly known as: ADVIL Take 1 tablet (800 mg total) by mouth 3 (three) times daily. What changed:  when to take this reasons to take this   labetalol 100 MG tablet Commonly known as: NORMODYNE Take 100 mg by mouth daily after lunch.    losartan-hydrochlorothiazide 100-25 MG tablet Commonly known as: HYZAAR Take 1 tablet by mouth daily after lunch. What changed: Another medication with the same name was removed. Continue taking this medication, and follow the directions you see here.   metFORMIN 1000 MG tablet Commonly known as: GLUCOPHAGE Take 1,000 mg by mouth See admin instructions. Take one tablet (1000 mg) by mouth twice daily - after lunch and after supper   OXYGEN Inhale 2-3 L into the lungs continuous.   predniSONE 20 MG tablet Commonly known as: DELTASONE Take 2 tablets (40 mg total) by mouth daily for 2 days. Start taking on: April 28, 2021   Toujeo SoloStar 300 UNIT/ML Solostar Pen Generic drug: insulin glargine (1 Unit Dial) Inject 40 Units into the skin 2 (two) times daily after a meal. What changed: how much to take   Trulicity 3 FH/5.4TG Sopn Generic drug: Dulaglutide Inject 3 mg into the skin every Sunday.   Vitamin D (Ergocalciferol) 1.25 MG (50000 UNIT) Caps capsule Commonly known as: DRISDOL Take 50,000 Units by mouth every Monday.        Follow-up Information     [name], MD. Schedule an appointment as soon as possible for a visit in 1 week(s).   Specialty: Internal Medicine Why: For hospital follow-up Contact information: [place] [address] [phone]  Elouise Munroe, MD. Schedule an appointment as soon as possible for a visit.   Specialties: Cardiology, Radiology Why: Heart failure Contact information: [address] [place] [address] [phone]                No Known Allergies  Consultations: None   Procedures/Studies: DG Chest 2 View  Result Date: 04/23/2021 CLINICAL DATA:  Chest pain and shortness of breath EXAM: CHEST - 2 VIEW COMPARISON:  Chest x-ray dated June 30, 2019 FINDINGS: Cardiomegaly. Bilateral interstitial opacities. No evidence of pleural effusion or pneumothorax. IMPRESSION:  Bilateral interstitial opacities, likely due to pulmonary edema. Cardiomegaly. Electronically Signed   By: Yetta Glassman M.D.   On: 04/23/2021 11:45   DG CHEST PORT 1 VIEW  Result Date: 04/26/2021 CLINICAL DATA:  Productive cough, shortness of breath EXAM: PORTABLE CHEST 1 VIEW COMPARISON:  04/25/2021 FINDINGS: Stable cardiomegaly. Mild pulmonary vascular congestion. Mildly prominent bilateral perihilar interstitial markings, similar to prior. No new airspace consolidation. No appreciable pleural fluid collection. No pneumothorax. IMPRESSION: Cardiomegaly with mild pulmonary vascular congestion. Mild bilateral perihilar interstitial prominence likely reflecting mild edema. Electronically Signed   By: Davina Poke D.O.   On: 04/26/2021 13:49   DG CHEST PORT 1 VIEW  Result Date: 04/25/2021 CLINICAL DATA:  Pulmonary edema EXAM: PORTABLE CHEST 1 VIEW COMPARISON:  04/23/2021 FINDINGS: Bilateral mild interstitial thickening. No focal consolidation. No pleural effusion or pneumothorax. Stable cardiomegaly. No acute osseous abnormality. IMPRESSION: Cardiomegaly with pulmonary vascular congestion. Electronically Signed   By: Kathreen Devoid M.D.   On: 04/25/2021 17:03   ECHOCARDIOGRAM COMPLETE  Result Date: 04/24/2021    ECHOCARDIOGRAM REPORT   Patient Name:   JACQUI HEADEN Date of Exam: 04/24/2021 Medical Rec #:  [id number]             Height:       59.0 in Accession #:    [id number]            Weight:       220.0 lb Date of Birth:  [birth year]            BSA:          1.921 m Patient Age:    26 years              BP:           139/68 mmHg Patient Gender: F                     HR:           96 bpm. Exam Location:  Inpatient Procedure: 2D Echo, Cardiac Doppler, Color Doppler and Intracardiac            Opacification Agent Indications:    CHF-Acute Diastolic 425.95 / G38.75  History:        Patient has prior history of Echocardiogram examinations, most                 recent 04/25/2020. Risk  Factors:Hypertension, Diabetes and Sleep                 Apnea.  Sonographer:    Darlina Sicilian RDCS Referring Phys: [id number] JENNIFER YATES  Sonographer Comments: Technically difficult study due to poor echo windows. IMPRESSIONS  1. Left ventricular ejection fraction, by estimation, is 65 to 70%. The left ventricle has normal function. The left ventricle has no regional wall motion abnormalities. There is severe left ventricular hypertrophy of the anterior  segment. Left ventricular diastolic parameters are consistent with Grade I diastolic dysfunction (impaired relaxation).  2. Right ventricular systolic function is normal. The right ventricular size is normal.  3. The mitral valve is normal in structure. Trivial mitral valve regurgitation. No evidence of mitral stenosis.  4. The aortic valve is normal in structure. Aortic valve regurgitation is not visualized. No aortic stenosis is present.  5. The inferior vena cava is normal in size with greater than 50% respiratory variability, suggesting right atrial pressure of 3 mmHg. Comparison(s): No significant change from prior study. Prior images reviewed side by side. FINDINGS  Left Ventricle: Left ventricular ejection fraction, by estimation, is 65 to 70%. The left ventricle has normal function. The left ventricle has no regional wall motion abnormalities. Definity contrast agent was given IV to delineate the left ventricular  endocardial borders. The left ventricular internal cavity size was normal in size. There is severe left ventricular hypertrophy of the anterior segment. Left ventricular diastolic parameters are consistent with Grade I diastolic dysfunction (impaired relaxation). Right Ventricle: The right ventricular size is normal. No increase in right ventricular wall thickness. Right ventricular systolic function is normal. Left Atrium: Left atrial size was normal in size. Right Atrium: Right atrial size was normal in size. Pericardium: There is no evidence of  pericardial effusion. Mitral Valve: The mitral valve is normal in structure. Trivial mitral valve regurgitation. No evidence of mitral valve stenosis. Tricuspid Valve: The tricuspid valve is normal in structure. Tricuspid valve regurgitation is not demonstrated. No evidence of tricuspid stenosis. Aortic Valve: The aortic valve is normal in structure. Aortic valve regurgitation is not visualized. No aortic stenosis is present. Pulmonic Valve: The pulmonic valve was normal in structure. Pulmonic valve regurgitation is not visualized. No evidence of pulmonic stenosis. Aorta: The aortic root is normal in size and structure. Venous: The inferior vena cava is normal in size with greater than 50% respiratory variability, suggesting right atrial pressure of 3 mmHg. IAS/Shunts: No atrial level shunt detected by color flow Doppler.  LEFT VENTRICLE PLAX 2D LVIDd:         3.50 cm  Diastology LVIDs:         2.70 cm  LV e' medial:    5.55 cm/s LV PW:         1.20 cm  LV E/e' medial:  13.3 LV IVS:        2.00 cm  LV e' lateral:   6.42 cm/s LVOT diam:     1.80 cm  LV E/e' lateral: 11.5 LV SV:         47 LV SV Index:   25 LVOT Area:     2.54 cm  LEFT ATRIUM             Index LA diam:        3.20 cm 1.67 cm/m LA Vol (A2C):   49.8 ml 25.93 ml/m LA Vol (A4C):   32.3 ml 16.82 ml/m LA Biplane Vol: 41.3 ml 21.50 ml/m  AORTIC VALVE LVOT Vmax:   106.00 cm/s LVOT Vmean:  80.500 cm/s LVOT VTI:    0.186 m  AORTA Ao Root diam: 2.80 cm Ao Asc diam:  3.00 cm MITRAL VALVE MV Area (PHT): 1.45 cm     SHUNTS MV Decel Time: 523 msec     Systemic VTI:  0.19 m MV E velocity: 73.70 cm/s   Systemic Diam: 1.80 cm MV A velocity: 110.00 cm/s MV E/A ratio:  0.67 Candee Furbish MD Electronically signed  by Candee Furbish MD Signature Date/Time: 04/24/2021/3:30:04 PM    Final       Subjective: Mild dyspnea overnight but overall much improved. No concerns this morning.  Discharge Exam: Vitals:   04/27/21 1345 04/27/21 1348  BP: (!) 143/129 (!) 152/69   Pulse: 83 85  Resp: 18   Temp: (!) 97.5 F (36.4 C)   SpO2: 91% 90%   Vitals:   04/27/21 0550 04/27/21 0837 04/27/21 1345 04/27/21 1348  BP:   (!) 143/129 (!) 152/69  Pulse:   83 85  Resp:   18   Temp:   (!) 97.5 F (36.4 C)   TempSrc:   Oral   SpO2: 96% 95% 91% 90%  Weight:      Height:        General: Pt is alert, awake, not in acute distress Cardiovascular: RRR, S1/S2 +, no rubs, no gallops Respiratory: Diminished with faint wheezes Abdominal: Soft, NT, ND, bowel sounds + Extremities: no cyanosis    The results of significant diagnostics from this hospitalization (including imaging, microbiology, ancillary and laboratory) are listed below for reference.     Microbiology: Recent Results (from the past 240 hour(s))  Resp Panel by RT-PCR (Flu A&B, Covid) Nasopharyngeal Swab     Status: None   Collection Time: 04/23/21 11:56 AM   Specimen: Nasopharyngeal Swab; Nasopharyngeal(NP) swabs in vial transport medium  Result Value Ref Range Status   SARS Coronavirus 2 by RT PCR NEGATIVE NEGATIVE Final    Comment: (NOTE) SARS-CoV-2 target nucleic acids are NOT DETECTED.  The SARS-CoV-2 RNA is generally detectable in upper respiratory specimens during the acute phase of infection. The lowest concentration of SARS-CoV-2 viral copies this assay can detect is 138 copies/mL. A negative result does not preclude SARS-Cov-2 infection and should not be used as the sole basis for treatment or other patient management decisions. A negative result may occur with  improper specimen collection/handling, submission of specimen other than nasopharyngeal swab, presence of viral mutation(s) within the areas targeted by this assay, and inadequate number of viral copies(<138 copies/mL). A negative result must be combined with clinical observations, patient history, and epidemiological information. The expected result is Negative.  Fact Sheet for Patients:   EntrepreneurPulse.com.au  Fact Sheet for Healthcare Providers:  IncredibleEmployment.be  This test is no t yet approved or cleared by the [place] FDA and  has been authorized for detection and/or diagnosis of SARS-CoV-2 by FDA under an Emergency Use Authorization (EUA). This EUA will remain  in effect (meaning this test can be used) for the duration of the COVID-19 declaration under Section 564(b)(1) of the Act, 21 U.S.C.section 360bbb-3(b)(1), unless the authorization is terminated  or revoked sooner.       Influenza A by PCR NEGATIVE NEGATIVE Final   Influenza B by PCR NEGATIVE NEGATIVE Final    Comment: (NOTE) The Xpert Xpress SARS-CoV-2/FLU/RSV plus assay is intended as an aid in the diagnosis of influenza from Nasopharyngeal swab specimens and should not be used as a sole basis for treatment. Nasal washings and aspirates are unacceptable for Xpert Xpress SARS-CoV-2/FLU/RSV testing.  Fact Sheet for Patients: EntrepreneurPulse.com.au  Fact Sheet for Healthcare Providers: IncredibleEmployment.be  This test is not yet approved or cleared by the [place] FDA and has been authorized for detection and/or diagnosis of SARS-CoV-2 by FDA under an Emergency Use Authorization (EUA). This EUA will remain in effect (meaning this test can be used) for the duration of the COVID-19 declaration under Section 564(b)(1) of  the Act, 21 U.S.C. section 360bbb-3(b)(1), unless the authorization is terminated or revoked.  Performed at [facility], [place] [address], [place], [place] [address]   MRSA Next Gen by PCR, Nasal     Status: None   Collection Time: 04/23/21  4:21 PM   Specimen: Nasal Mucosa; Nasal Swab  Result Value Ref Range Status   MRSA by PCR Next Gen NOT DETECTED NOT DETECTED Final    Comment: (NOTE) The GeneXpert MRSA Assay (FDA approved for NASAL specimens only), is one  component of a comprehensive MRSA colonization surveillance program. It is not intended to diagnose MRSA infection nor to guide or monitor treatment for MRSA infections. Test performance is not FDA approved in patients less than 6 years old. Performed at [facility], [place] [address], [place], [place] [address]   Expectorated Sputum Assessment w Gram Stain, Rflx to Resp Cult     Status: None   Collection Time: 04/26/21 11:36 AM   Specimen: Expectorated Sputum  Result Value Ref Range Status   Specimen Description EXPECTORATED SPUTUM  Final   Special Requests NONE  Final   Sputum evaluation   Final    THIS SPECIMEN IS ACCEPTABLE FOR SPUTUM CULTURE Performed at [facility], [place] [address], [place], [place] [address]    Report Status 04/26/2021 FINAL  Final  Culture, Respiratory w Gram Stain     Status: None (Preliminary result)   Collection Time: 04/26/21 11:36 AM  Result Value Ref Range Status   Specimen Description   Final    EXPECTORATED SPUTUM Performed at [place] [address], [place], [place] [address]    Special Requests   Final    NONE Reflexed from [id number] Performed at [facility], [place] [address] [place], [place] [address]    Gram Stain   Final    RARE SQUAMOUS EPITHELIAL CELLS PRESENT MODERATE WBC PRESENT,BOTH PMN AND MONONUCLEAR MODERATE GRAM POSITIVE COCCI FEW GRAM NEGATIVE RODS RARE GRAM POSITIVE RODS    Culture   Final    ABUNDANT HAEMOPHILUS INFLUENZAE BETA LACTAMASE NEGATIVE Performed at [facility], [address] [place],  [address]    Report Status PENDING  Incomplete     Labs: BNP (last 3 results) Recent Labs    04/23/21 1216  BNP 76.7   Basic Metabolic Panel: Recent Labs  Lab 04/24/21 1958 04/25/21 0306 04/25/21 0914 04/25/21 1643 04/25/21 2211 04/26/21 0428 04/27/21 0619 04/27/21 0930  NA 136 136  --  137  --  135 139  --   K 4.8 5.7*    < > 6.2* 4.7 4.3 5.8* 4.7  CL 98 101  --  104  --  102 103  --   CO2 26 26  --  25  --  28 30  --   GLUCOSE 340* 221*  --  387*  --  50* 146*  --   BUN 44* 46*  --  53*  --  50* 44*  --   CREATININE 1.07* 1.35*  --  1.31*  --  1.11* 1.15*  --   CALCIUM 10.5* 9.9  --  9.9  --  9.6 9.5  --   MG  --   --   --   --   --   --  2.2  --    < > = values in this interval not displayed.   Liver Function Tests: No results for input(s): AST, ALT,  ALKPHOS, BILITOT, PROT, ALBUMIN in the last 168 hours. No results for input(s): LIPASE, AMYLASE in the last 168 hours. No results for input(s): AMMONIA in the last 168 hours. CBC: Recent Labs  Lab 04/23/21 1216 04/24/21 0246 04/25/21 0306  WBC 8.3 8.1 11.0*  HGB 12.0 10.7* 11.1*  HCT 41.3 36.4 37.1  MCV 70.4* 69.5* 68.7*  PLT 262 255 275   Cardiac Enzymes: No results for input(s): CKTOTAL, CKMB, CKMBINDEX, TROPONINI in the last 168 hours. BNP: Invalid input(s): POCBNP CBG: Recent Labs  Lab 04/26/21 1330 04/26/21 1644 04/26/21 2005 04/27/21 0731 04/27/21 1141  GLUCAP 196* 80 150* 141* 277*   D-Dimer No results for input(s): DDIMER in the last 72 hours. Hgb A1c No results for input(s): HGBA1C in the last 72 hours. Lipid Profile No results for input(s): CHOL, HDL, LDLCALC, TRIG, CHOLHDL, LDLDIRECT in the last 72 hours. Thyroid function studies No results for input(s): TSH, T4TOTAL, T3FREE, THYROIDAB in the last 72 hours.  Invalid input(s): FREET3 Anemia work up No results for input(s): VITAMINB12, FOLATE, FERRITIN, TIBC, IRON, RETICCTPCT in the last 72 hours. Urinalysis    Component Value Date/Time   COLORURINE YELLOW 06/30/2019 2007   APPEARANCEUR CLEAR 06/30/2019 2007   LABSPEC 1.020 09/26/2020 0852   PHURINE 6.0 09/26/2020 0852   GLUCOSEU NEGATIVE 09/26/2020 0852   HGBUR TRACE (A) 09/26/2020 0852   BILIRUBINUR NEGATIVE 09/26/2020 0852   KETONESUR NEGATIVE 09/26/2020 0852   PROTEINUR 30 (A) 09/26/2020 0852   UROBILINOGEN  0.2 09/26/2020 0852   NITRITE NEGATIVE 09/26/2020 0852   LEUKOCYTESUR NEGATIVE 09/26/2020 0852   Sepsis Labs Invalid input(s): PROCALCITONIN,  WBC,  LACTICIDVEN Microbiology Recent Results (from the past 240 hour(s))  Resp Panel by RT-PCR (Flu A&B, Covid) Nasopharyngeal Swab     Status: None   Collection Time: 04/23/21 11:56 AM   Specimen: Nasopharyngeal Swab; Nasopharyngeal(NP) swabs in vial transport medium  Result Value Ref Range Status   SARS Coronavirus 2 by RT PCR NEGATIVE NEGATIVE Final    Comment: (NOTE) SARS-CoV-2 target nucleic acids are NOT DETECTED.  The SARS-CoV-2 RNA is generally detectable in upper respiratory specimens during the acute phase of infection. The lowest concentration of SARS-CoV-2 viral copies this assay can detect is 138 copies/mL. A negative result does not preclude SARS-Cov-2 infection and should not be used as the sole basis for treatment or other patient management decisions. A negative result may occur with  improper specimen collection/handling, submission of specimen other than nasopharyngeal swab, presence of viral mutation(s) within the areas targeted by this assay, and inadequate number of viral copies(<138 copies/mL). A negative result must be combined with clinical observations, patient history, and epidemiological information. The expected result is Negative.  Fact Sheet for Patients:  EntrepreneurPulse.com.au  Fact Sheet for Healthcare Providers:  IncredibleEmployment.be  This test is no t yet approved or cleared by the [place] FDA and  has been authorized for detection and/or diagnosis of SARS-CoV-2 by FDA under an Emergency Use Authorization (EUA). This EUA will remain  in effect (meaning this test can be used) for the duration of the COVID-19 declaration under Section 564(b)(1) of the Act, 21 U.S.C.section 360bbb-3(b)(1), unless the authorization is terminated  or revoked sooner.        Influenza A by PCR NEGATIVE NEGATIVE Final   Influenza B by PCR NEGATIVE NEGATIVE Final    Comment: (NOTE) The Xpert Xpress SARS-CoV-2/FLU/RSV plus assay is intended as an aid in the diagnosis of influenza from Nasopharyngeal swab specimens and should not  be used as a sole basis for treatment. Nasal washings and aspirates are unacceptable for Xpert Xpress SARS-CoV-2/FLU/RSV testing.  Fact Sheet for Patients: EntrepreneurPulse.com.au  Fact Sheet for Healthcare Providers: IncredibleEmployment.be  This test is not yet approved or cleared by the [place] FDA and has been authorized for detection and/or diagnosis of SARS-CoV-2 by FDA under an Emergency Use Authorization (EUA). This EUA will remain in effect (meaning this test can be used) for the duration of the COVID-19 declaration under Section 564(b)(1) of the Act, 21 U.S.C. section 360bbb-3(b)(1), unless the authorization is terminated or revoked.  Performed at [facility], [place] [address], [place], [place] [address]   MRSA Next Gen by PCR, Nasal     Status: None   Collection Time: 04/23/21  4:21 PM   Specimen: Nasal Mucosa; Nasal Swab  Result Value Ref Range Status   MRSA by PCR Next Gen NOT DETECTED NOT DETECTED Final    Comment: (NOTE) The GeneXpert MRSA Assay (FDA approved for NASAL specimens only), is one component of a comprehensive MRSA colonization surveillance program. It is not intended to diagnose MRSA infection nor to guide or monitor treatment for MRSA infections. Test performance is not FDA approved in patients less than 15 years old. Performed at [facility], [place] [address], [place], [place] [address]   Expectorated Sputum Assessment w Gram Stain, Rflx to Resp Cult     Status: None   Collection Time: 04/26/21 11:36 AM   Specimen: Expectorated Sputum  Result Value Ref Range Status   Specimen Description EXPECTORATED SPUTUM  Final    Special Requests NONE  Final   Sputum evaluation   Final    THIS SPECIMEN IS ACCEPTABLE FOR SPUTUM CULTURE Performed at [facility], [place] [address], [place], [place] [address]    Report Status 04/26/2021 FINAL  Final  Culture, Respiratory w Gram Stain     Status: None (Preliminary result)   Collection Time: 04/26/21 11:36 AM  Result Value Ref Range Status   Specimen Description   Final    EXPECTORATED SPUTUM Performed at [place] [address], [place], [place] [address]    Special Requests   Final    NONE Reflexed from [id number] Performed at [facility], [place] [address], [place], [place] [address]    Gram Stain   Final    RARE SQUAMOUS EPITHELIAL CELLS PRESENT MODERATE WBC PRESENT,BOTH PMN AND MONONUCLEAR MODERATE GRAM POSITIVE COCCI FEW GRAM NEGATIVE RODS RARE GRAM POSITIVE RODS    Culture   Final    ABUNDANT HAEMOPHILUS INFLUENZAE BETA LACTAMASE NEGATIVE Performed at [facility], [address], [place], [place] [address]    Report Status PENDING  Incomplete     Time coordinating discharge: 35 minutes  SIGNED:   [name], MD [facility] 04/27/2021, 1:55 PM

## 2021-04-27 NOTE — Care Management Important Message (Signed)
Medicare IM printed for Social Work at [facility] to give to the patient 

## 2021-04-28 LAB — CULTURE, RESPIRATORY W GRAM STAIN

## 2021-05-14 ENCOUNTER — Encounter: Payer: Self-pay | Admitting: Acute Care

## 2021-05-14 ENCOUNTER — Ambulatory Visit (INDEPENDENT_AMBULATORY_CARE_PROVIDER_SITE_OTHER): Payer: Medicare HMO | Admitting: Acute Care

## 2021-05-14 ENCOUNTER — Other Ambulatory Visit: Payer: Self-pay

## 2021-05-14 VITALS — BP 132/82 | HR 94 | Temp 98.9°F | Ht <= 58 in | Wt 215.0 lb

## 2021-05-14 DIAGNOSIS — I161 Hypertensive emergency: Secondary | ICD-10-CM

## 2021-05-14 DIAGNOSIS — J189 Pneumonia, unspecified organism: Secondary | ICD-10-CM

## 2021-05-14 DIAGNOSIS — J45909 Unspecified asthma, uncomplicated: Secondary | ICD-10-CM | POA: Diagnosis not present

## 2021-05-14 DIAGNOSIS — Z720 Tobacco use: Secondary | ICD-10-CM

## 2021-05-14 DIAGNOSIS — I5031 Acute diastolic (congestive) heart failure: Secondary | ICD-10-CM

## 2021-05-14 DIAGNOSIS — D869 Sarcoidosis, unspecified: Secondary | ICD-10-CM | POA: Diagnosis not present

## 2021-05-14 DIAGNOSIS — J9620 Acute and chronic respiratory failure, unspecified whether with hypoxia or hypercapnia: Secondary | ICD-10-CM | POA: Diagnosis not present

## 2021-05-14 DIAGNOSIS — Z9989 Dependence on other enabling machines and devices: Secondary | ICD-10-CM

## 2021-05-14 DIAGNOSIS — G4733 Obstructive sleep apnea (adult) (pediatric): Secondary | ICD-10-CM

## 2021-05-14 NOTE — Patient Instructions (Addendum)
It is good to see you today.  I am so glad you are feeling better  We finish your current prescription of Augmentin. Continue Duonebs once daily in the morning. Wear oxygen 24/7 for now.  Saturation goals are > 88% at all times Wear oxygen at 2 L at rest and 3 L with activity. Continue Symbicort 2 puffs in the morning and the evening  Follow up in 2 months with Judson Roch NP or Dr. Halford Chessman Continue on CPAP at bedtime. You appear to be benefiting from the treatment  Goal is to wear for at least 6 hours each night for maximal clinical benefit. Continue to work on weight loss, as the link between excess weight  and sleep apnea is well established.   Remember to establish a good bedtime routine, and work on sleep hygiene.  Limit daytime naps , avoid stimulants such as caffeine and nicotine close to bedtime, exercise daily to promote sleep quality, avoid heavy , spicy, fried , or rich foods before bed. Ensure adequate exposure to natural light during the day,establish a relaxing bedtime routine with a pleasant sleep environment ( Bedroom between 60 and 67 degrees, turn off bright lights , TV or device screens screens , consider black out curtains or white noise machines) Do not drive if sleepy. Remember to clean mask, tubing, filter, and reservoir once weekly with soapy water.  Please contact office for sooner follow up if symptoms do not improve or worsen or seek emergency care

## 2021-05-14 NOTE — Progress Notes (Addendum)
History of Present Illness Cassandra Burns is a 55 y.o. female with with medical history significant of DM; HTN; morbid obesity; sarcoidosis; and OSA on BIPAP. Asthma and ? COPD presenting with SOB.  She reports that she has had DOE, orthopnea, and polyuria.  Admit date: 04/23/2021 Discharge date: 04/27/2021   05/14/2021 Pt. Presents for hospital follow up. She was admitted 04/23/2021 -04/27/2021 with heart failure, hypertensive crisis and acute on chronic respiratory failure 2/2 asthma flare/ CAP/CHF vs HTN. She was treated with antibiotics , diuresis, BD, oxygen, and BP management with NTG gtt and then transitioned to po medication regimen. She states he feels she is back to her baseline. She is compliant with her BiPAP at bedtime, and when she wears it ( 96% compliance) her OSA is controlled at AHI of 0.2.She is wearing her oxygen 24/7. She has stopped smoking Cassandra Burns. She is compliant with her lasix and she completed her Augmentin , and is now on a BID dosing Augmentin x 7 additional days per her PCP. She was restarted on prednisone in the hospital , she has subsequently tapered off. She has had much better control of her BP. Today she is  132/82. We discussed the importance of being on top of her BP at all times. Risk of stroke and worsening heart disease. She verbalized understanding of taking her BP medication every day without fail, in addition to her lasix and refraining from smoking.   Test Results: 04/26/2021 CXR Cardiomegaly with mild pulmonary vascular congestion. Mild bilateral perihilar interstitial prominence likely reflecting mild edema.  BiPAP Down Load 04/11/2021-05/10/2021 Settings: IPAP 16 cm H2O/ Set EPAP 12 cm H2O Usage 26/30 days > 4 hours 26 days ( 87%) < 4 hours 0 days  Average usage 9 hours 30 minutes AHI 0.2     CBC Latest Ref Rng & Units 04/25/2021 04/24/2021 04/23/2021  WBC 4.0 - 10.5 K/uL 11.0(H) 8.1 8.3  Hemoglobin 12.0 - 15.0 g/dL 11.1(L) 10.7(L) 12.0   Hematocrit 36.0 - 46.0 % 37.1 36.4 41.3  Platelets 150 - 400 K/uL 275 255 262    BMP Latest Ref Rng & Units 04/27/2021 04/27/2021 04/26/2021  Glucose 70 - 99 mg/dL - 146(H) 50(L)  BUN 6 - 20 mg/dL - 44(H) 50(H)  Creatinine 0.44 - 1.00 mg/dL - 1.15(H) 1.11(H)  BUN/Creat Ratio 6 - 22 (calc) - - -  Sodium 135 - 145 mmol/L - 139 135  Potassium 3.5 - 5.1 mmol/L 4.7 5.8(H) 4.3  Chloride 98 - 111 mmol/L - 103 102  CO2 22 - 32 mmol/L - 30 28  Calcium 8.9 - 10.3 mg/dL - 9.5 9.6    BNP    Component Value Date/Time   BNP 94.2 04/23/2021 1216    ProBNP    Component Value Date/Time   PROBNP 175.8 (H) 07/10/2014 1320    PFT    Component Value Date/Time   FEV1PRE 0.88 11/08/2019 1011   FEV1POST 0.80 11/08/2019 1011   FVCPRE 1.00 11/08/2019 1011   FVCPOST 0.87 11/08/2019 1011   TLC 2.50 11/08/2019 1011   DLCOUNC 9.95 11/08/2019 1011   PREFEV1FVCRT 89 11/08/2019 1011   PSTFEV1FVCRT 91 11/08/2019 1011    DG Chest 2 View  Result Date: 04/23/2021 CLINICAL DATA:  Chest pain and shortness of breath EXAM: CHEST - 2 VIEW COMPARISON:  Chest x-ray dated June 30, 2019 FINDINGS: Cardiomegaly. Bilateral interstitial opacities. No evidence of pleural effusion or pneumothorax. IMPRESSION: Bilateral interstitial opacities, likely due to pulmonary edema. Cardiomegaly. Electronically Signed  By: Yetta Glassman M.D.   On: 04/23/2021 11:45   DG CHEST PORT 1 VIEW  Result Date: 04/26/2021 CLINICAL DATA:  Productive cough, shortness of breath EXAM: PORTABLE CHEST 1 VIEW COMPARISON:  04/25/2021 FINDINGS: Stable cardiomegaly. Mild pulmonary vascular congestion. Mildly prominent bilateral perihilar interstitial markings, similar to prior. No new airspace consolidation. No appreciable pleural fluid collection. No pneumothorax. IMPRESSION: Cardiomegaly with mild pulmonary vascular congestion. Mild bilateral perihilar interstitial prominence likely reflecting mild edema. Electronically Signed   By: Davina Poke D.O.   On: 04/26/2021 13:49   DG CHEST PORT 1 VIEW  Result Date: 04/25/2021 CLINICAL DATA:  Pulmonary edema EXAM: PORTABLE CHEST 1 VIEW COMPARISON:  04/23/2021 FINDINGS: Bilateral mild interstitial thickening. No focal consolidation. No pleural effusion or pneumothorax. Stable cardiomegaly. No acute osseous abnormality. IMPRESSION: Cardiomegaly with pulmonary vascular congestion. Electronically Signed   By: Kathreen Devoid M.D.   On: 04/25/2021 17:03   ECHOCARDIOGRAM COMPLETE  Result Date: 04/24/2021    ECHOCARDIOGRAM REPORT   Patient Name:   Cassandra Burns Date of Exam: 04/24/2021 Medical Rec #:  [id number]             Height:       59.0 in Accession #:    [id number]            Weight:       220.0 lb Date of Birth:  March 04, 1966            BSA:          1.921 m Patient Age:    60 years              BP:           139/68 mmHg Patient Gender: F                     HR:           96 bpm. Exam Location:  Inpatient Procedure: 2D Echo, Cardiac Doppler, Color Doppler and Intracardiac            Opacification Agent Indications:    CHF-Acute Diastolic 211.94 / R74.08  History:        Patient has prior history of Echocardiogram examinations, most                 recent 04/25/2020. Risk Factors:Hypertension, Diabetes and Sleep                 Apnea.  Sonographer:    Darlina Sicilian RDCS Referring Phys: [id number] JENNIFER YATES  Sonographer Comments: Technically difficult study due to poor echo windows. IMPRESSIONS  1. Left ventricular ejection fraction, by estimation, is 65 to 70%. The left ventricle has normal function. The left ventricle has no regional wall motion abnormalities. There is severe left ventricular hypertrophy of the anterior segment. Left ventricular diastolic parameters are consistent with Grade I diastolic dysfunction (impaired relaxation).  2. Right ventricular systolic function is normal. The right ventricular size is normal.  3. The mitral valve is normal in structure. Trivial mitral valve  regurgitation. No evidence of mitral stenosis.  4. The aortic valve is normal in structure. Aortic valve regurgitation is not visualized. No aortic stenosis is present.  5. The inferior vena cava is normal in size with greater than 50% respiratory variability, suggesting right atrial pressure of 3 mmHg. Comparison(s): No significant change from prior study. Prior images reviewed side by side. FINDINGS  Left Ventricle: Left ventricular ejection fraction, by estimation, is  65 to 70%. The left ventricle has normal function. The left ventricle has no regional wall motion abnormalities. Definity contrast agent was given IV to delineate the left ventricular  endocardial borders. The left ventricular internal cavity size was normal in size. There is severe left ventricular hypertrophy of the anterior segment. Left ventricular diastolic parameters are consistent with Grade I diastolic dysfunction (impaired relaxation). Right Ventricle: The right ventricular size is normal. No increase in right ventricular wall thickness. Right ventricular systolic function is normal. Left Atrium: Left atrial size was normal in size. Right Atrium: Right atrial size was normal in size. Pericardium: There is no evidence of pericardial effusion. Mitral Valve: The mitral valve is normal in structure. Trivial mitral valve regurgitation. No evidence of mitral valve stenosis. Tricuspid Valve: The tricuspid valve is normal in structure. Tricuspid valve regurgitation is not demonstrated. No evidence of tricuspid stenosis. Aortic Valve: The aortic valve is normal in structure. Aortic valve regurgitation is not visualized. No aortic stenosis is present. Pulmonic Valve: The pulmonic valve was normal in structure. Pulmonic valve regurgitation is not visualized. No evidence of pulmonic stenosis. Aorta: The aortic root is normal in size and structure. Venous: The inferior vena cava is normal in size with greater than 50% respiratory variability, suggesting  right atrial pressure of 3 mmHg. IAS/Shunts: No atrial level shunt detected by color flow Doppler.  LEFT VENTRICLE PLAX 2D LVIDd:         3.50 cm  Diastology LVIDs:         2.70 cm  LV e' medial:    5.55 cm/s LV PW:         1.20 cm  LV E/e' medial:  13.3 LV IVS:        2.00 cm  LV e' lateral:   6.42 cm/s LVOT diam:     1.80 cm  LV E/e' lateral: 11.5 LV SV:         47 LV SV Index:   25 LVOT Area:     2.54 cm  LEFT ATRIUM             Index LA diam:        3.20 cm 1.67 cm/m LA Vol (A2C):   49.8 ml 25.93 ml/m LA Vol (A4C):   32.3 ml 16.82 ml/m LA Biplane Vol: 41.3 ml 21.50 ml/m  AORTIC VALVE LVOT Vmax:   106.00 cm/s LVOT Vmean:  80.500 cm/s LVOT VTI:    0.186 m  AORTA Ao Root diam: 2.80 cm Ao Asc diam:  3.00 cm MITRAL VALVE MV Area (PHT): 1.45 cm     SHUNTS MV Decel Time: 523 msec     Systemic VTI:  0.19 m MV E velocity: 73.70 cm/s   Systemic Diam: 1.80 cm MV A velocity: 110.00 cm/s MV E/A ratio:  0.67 Candee Furbish MD Electronically signed by Candee Furbish MD Signature Date/Time: 04/24/2021/3:30:04 PM    Final      Past medical hx Past Medical History:  Diagnosis Date   Anxiety    Asthma    Bone pain 06/07/2013   Depression    Diabetes mellitus 12/18/2012   NIDDM   Hernia of abdominal cavity 10/25/2013   History of anemia 07/26/2013   Hypertension    Iron deficiency anemia due to chronic blood loss    Lymphadenopathy 06/07/2013   Obesity 12/18/2012   Psoriasis    Sarcoidosis 07/26/2013   Sleep apnea    uses bipap     Social History   Tobacco  Use   Smoking status: Former    Types: Cigars    Quit date: 06/30/2017    Years since quitting: 3.8   Smokeless tobacco: Never   Tobacco comments:    Smokes Marijuana ("here and there") (as needed)  Vaping Use   Vaping Use: Never used  Substance Use Topics   Alcohol use: Yes    Comment: occasionally   Drug use: Yes    Frequency: 7.0 times per week    Types: Marijuana    Comment: Marijuana--once a week    Ms.Donn reports that she quit  smoking about 3 years ago. Her smoking use included cigars. She has never used smokeless tobacco. She reports current alcohol use. She reports current drug use. Frequency: 7.00 times per week. Drug: Marijuana.  Tobacco Cessation: Counseled on complete smoking abstinence Former smoker   Past surgical hx, Family hx, Social hx all reviewed.  Current Outpatient Medications on File Prior to Visit  Medication Sig   albuterol (PROVENTIL) (2.5 MG/3ML) 0.083% nebulizer solution Take 3 mLs (2.5 mg total) by nebulization every 6 (six) hours as needed for wheezing or shortness of breath.   albuterol (VENTOLIN HFA) 108 (90 Base) MCG/ACT inhaler Inhale 1-2 puffs into the lungs every 6 (six) hours as needed for wheezing or shortness of breath.   amLODipine (NORVASC) 10 MG tablet Take 1 tablet (10 mg total) by mouth daily. (Patient taking differently: Take 10 mg by mouth daily after lunch.)   budesonide-formoterol (SYMBICORT) 160-4.5 MCG/ACT inhaler Inhale 2 puffs into the lungs 2 (two) times daily.   Dulaglutide (TRULICITY) 3 BD/5.3GD SOPN Inject 3 mg into the skin every Sunday.   fluticasone (FLONASE) 50 MCG/ACT nasal spray Place 1 spray into both nostrils daily as needed for allergies or rhinitis.   furosemide (LASIX) 20 MG tablet Take 1 tablet (20 mg total) by mouth daily.   gabapentin (NEURONTIN) 800 MG tablet Take 800 mg by mouth 3 (three) times daily.   ibuprofen (ADVIL) 800 MG tablet Take 1 tablet (800 mg total) by mouth 3 (three) times daily. (Patient taking differently: Take 800 mg by mouth daily as needed for headache (pain).)   labetalol (NORMODYNE) 100 MG tablet Take 100 mg by mouth daily after lunch.   losartan-hydrochlorothiazide (HYZAAR) 100-25 MG tablet Take 1 tablet by mouth daily after lunch.   metFORMIN (GLUCOPHAGE) 1000 MG tablet Take 1,000 mg by mouth See admin instructions. Take one tablet (1000 mg) by mouth twice daily - after lunch and after supper   OXYGEN Inhale 2-3 L into the lungs  continuous.   TOUJEO SOLOSTAR 300 UNIT/ML Solostar Pen Inject 40 Units into the skin 2 (two) times daily after a meal.   Vitamin D, Ergocalciferol, (DRISDOL) 1.25 MG (50000 UNIT) CAPS capsule Take 50,000 Units by mouth every Monday.   No current facility-administered medications on file prior to visit.     No Known Allergies  Review Of Systems:  Constitutional:   No  weight loss, night sweats,  Fevers, chills,+  fatigue, or  lassitude.  HEENT:   No headaches,  Difficulty swallowing,  Tooth/dental problems, or  Sore throat,                No sneezing, itching, ear ache, nasal congestion, post nasal drip,   CV:  No chest pain,  Orthopnea, PND, swelling in lower extremities, anasarca, dizziness, palpitations, syncope.   GI  No heartburn, indigestion, abdominal pain, nausea, vomiting, diarrhea, change in bowel habits, loss of appetite, bloody stools.  Resp: + shortness of breath with exertion less at rest.  No excess mucus, +  productive cough,  No non-productive cough,  No coughing up of blood.  No change in color of mucus.  No wheezing.  No chest wall deformity  Skin: no rash or lesions.  GU: no dysuria, change in color of urine, no urgency or frequency.  No flank pain, no hematuria   MS:  No joint pain or swelling.  No decreased range of motion.  No back pain.  Psych:  No change in mood or affect. No depression or anxiety.  No memory loss.   Vital Signs BP 132/82 (BP Location: Left Arm, Patient Position: Sitting, Cuff Size: Large)   Pulse 94   Temp 98.9 F (37.2 C) (Oral)   Ht 4\' 9"  (1.448 m)   Wt 215 lb (97.5 kg)   LMP 06/13/2012   SpO2 94%   BMI 46.53 kg/m    Physical Exam:  General- No distress,  A&Ox3, pleasant ENT: No sinus tenderness, TM clear, pale nasal mucosa, no oral exudate,no post nasal drip, no LAN Cardiac: S1, S2, regular rate and rhythm, no murmur Chest: No wheeze/ rales/ dullness; no accessory muscle use, no nasal flaring, no sternal retractions,  diminished per bases Abd.: Soft Non-tender, ND, BS +, Body mass index is 46.53 kg/m.  Ext: No clubbing cyanosis, edema Neuro:  normal strength, MAE x 4, A &O x 3 Skin: No rashes, warm and dry Psych: normal mood and behavior   Assessment/Plan Acute on chronic respiratory failure with hypoxia In setting of heart failure and asthma. Baseline of 2-3 l/min of oxygen during the day and CPAP at night.   Required 5 L/min of oxygen on admission and is now weaned to her baseline.  Plan -Continue oxygen during the day -Continue BiPAP qhs - Oxygen saturations goals are > 88% at all times - Continue Duonebs once daily in the morning. - Wear oxygen 24/7 for now.  - Saturation goals are > 88% at all times - Wear oxygen at 2 L at rest and 3 L with activity. - Continue Symbicort 2 puffs in the morning and the evening  - Follow up in 2 months with Judson Roch NP or Dr. Halford Chessman   Hypertensive emergency Patient is on amlodipine, labetalol, Hyzaar as an outpatient. BP of 224/107 on admission.  Plan -Continue amlodipine, labetalol -Hyzaar held secondary to AKI and hyperkalemia -Continue Hydralazine prn   Asthma exacerbation Sarcoidosis Started on steroids and managed additionally with Duoneb breathing treatments with improvement. Patient discharged to complete a 5 day course of antibiotics and to continue bronchodilator therapy.  OSA on BiPAP with good control Plan Continue on BiPAP at bedtime. You appear to be benefiting from the treatment  Goal is to wear for at least 6 hours each night for maximal clinical benefit. Continue to work on weight loss, as the link between excess weight  and sleep apnea is well established.   Remember to establish a good bedtime routine, and work on sleep hygiene.  Limit daytime naps , avoid stimulants such as caffeine and nicotine close to bedtime, exercise daily to promote sleep quality, avoid heavy , spicy, fried , or rich foods before bed. Ensure adequate exposure to  natural light during the day,establish a relaxing bedtime routine with a pleasant sleep environment ( Bedroom between 60 and 67 degrees, turn off bright lights , TV or device screens screens , consider black out curtains or white noise machines) Do not drive if sleepy.  Remember to clean mask, tubing, filter, and reservoir once weekly with soapy water.  Please contact office for sooner follow up if symptoms do not improve or worsen or seek emergency care      Acute on chronic diastolic heart failure Patient given Lasix on admission which was discontinued secondary to AKI. Respiratory status baseline. Transthoracic Echocardiogram significant for severe LVH and grade 1 diastolic heart dysfunction. Chest x-ray on 9/29 with development of pulmonary edema. AKI improved.  Plan  -Discharged on Lasix 20 mg daily. -Weight on discharge of 219 lbs (99.5 kg).  -Recommendation for cardiology follow-up.   Possible developing pneumonia Chest x-ray without specific infiltrate but clinical symptoms concerning. Patient started on empiric Augmentin  Sputum culture pending on discharge, however preliminarily positive for H. Influenzae.  Plan -Discharge to complete a 7 day course of Augmentin  - Additional 7 days Augmentin per PCP     THC abuse Counseled. Patient is motivated to quit.    Morbid obesty Body mass index is 44.31 kg/m.  Discussed weight loss.  Patient is motivated.  Had been previously referred to [facility], may need re-referral  I spent 45 minutes dedicated to the care of this patient on the date of this encounter to include pre-visit review of records, face-to-face time with the patient discussing conditions above, post visit ordering of testing, clinical documentation with the electronic health record, making appropriate referrals as documented, and communicating necessary information to the patient's healthcare team.     Magdalen Spatz, NP 05/14/2021  11:16  AM

## 2021-05-15 NOTE — Progress Notes (Signed)
Reviewed and agree with assessment/plan.   Chesley Mires, MD [facility] Pulmonary/Critical Care 05/15/2021, 8:48 AM Pager:  [phone]

## 2021-05-31 ENCOUNTER — Other Ambulatory Visit: Payer: Self-pay | Admitting: Internal Medicine

## 2021-05-31 NOTE — Progress Notes (Signed)
Referring-Edwin Avbuere MD Reason for referral-hypertension and dyspnea  HPI: 55 year old female for evaluation of hypertension and dyspnea at request of [name] MD. High resolution chest CT June 2021 showed findings consistent with sarcoid, coronary artery calcification and aortic atherosclerosis.  CPX August 2021 showed severe functional limitation primarily pulmonary with severe hypertensive response.  Patient body habitus felt to be contributing significantly to dyspnea.  Patient admitted October 2022 with CHF, hypertension and respiratory failure.  She was diuresed, treated with antibiotics and blood pressure medications adjusted.  September 2022 showed cardiomegaly with mild vascular congestion.  Echocardiogram September 2022 showed normal LV function, severe left ventricular hypertrophy, grade 1 diastolic dysfunction.  Since she was discharged from the hospital she notes some dyspnea on exertion but much improved.  There is no orthopnea, PND, pedal edema, exertional chest pain, palpitations or syncope.  Cardiology now asked to evaluate.  Current Outpatient Medications  Medication Sig Dispense Refill   albuterol (PROVENTIL) (2.5 MG/3ML) 0.083% nebulizer solution Take 3 mLs (2.5 mg total) by nebulization every 6 (six) hours as needed for wheezing or shortness of breath. 75 mL 12   albuterol (VENTOLIN HFA) 108 (90 Base) MCG/ACT inhaler Inhale 1-2 puffs into the lungs every 6 (six) hours as needed for wheezing or shortness of breath. 18 g 6   amLODipine (NORVASC) 10 MG tablet Take 1 tablet (10 mg total) by mouth daily. (Patient taking differently: Take 10 mg by mouth daily after lunch.) 30 tablet 1   budesonide-formoterol (SYMBICORT) 160-4.5 MCG/ACT inhaler Inhale 2 puffs into the lungs 2 (two) times daily. 1 Inhaler 6   Dulaglutide (TRULICITY) 3 MG/8.6PY SOPN Inject 3 mg into the skin every Sunday.     fluticasone (FLONASE) 50 MCG/ACT nasal spray Place 1 spray into both nostrils daily as  needed for allergies or rhinitis.     furosemide (LASIX) 20 MG tablet Take 1 tablet (20 mg total) by mouth daily. 90 tablet 0   gabapentin (NEURONTIN) 800 MG tablet Take 800 mg by mouth 3 (three) times daily.     ibuprofen (ADVIL) 800 MG tablet Take 1 tablet (800 mg total) by mouth 3 (three) times daily. (Patient taking differently: Take 800 mg by mouth daily as needed for headache (pain).) 21 tablet 0   labetalol (NORMODYNE) 100 MG tablet Take 100 mg by mouth daily after lunch.     losartan-hydrochlorothiazide (HYZAAR) 100-25 MG tablet Take 1 tablet by mouth daily after lunch.     metFORMIN (GLUCOPHAGE) 1000 MG tablet Take 1,000 mg by mouth See admin instructions. Take one tablet (1000 mg) by mouth twice daily - after lunch and after supper     OXYGEN Inhale 2-3 L into the lungs continuous.     TOUJEO SOLOSTAR 300 UNIT/ML Solostar Pen Inject 40 Units into the skin 2 (two) times daily after a meal.     Vitamin D, Ergocalciferol, (DRISDOL) 1.25 MG (50000 UNIT) CAPS capsule Take 50,000 Units by mouth every Monday.     No current facility-administered medications for this visit.    No Known Allergies   Past Medical History:  Diagnosis Date   Anxiety    Asthma    Bone pain 06/07/2013   Depression    Diabetes mellitus 12/18/2012   NIDDM   Hernia of abdominal cavity 10/25/2013   History of anemia 07/26/2013   Hypertension    Iron deficiency anemia due to chronic blood loss    Lymphadenopathy 06/07/2013   Obesity 12/18/2012   Psoriasis  Sarcoidosis 07/26/2013   Sleep apnea    uses bipap    Past Surgical History:  Procedure Laterality Date   TUBAL LIGATION      Social History   Socioeconomic History   Marital status: Single    Spouse name: Not on file   Number of children: 2   Years of education: Not on file   Highest education level: Not on file  Occupational History   Occupation: homemaker  Tobacco Use   Smoking status: Former    Types: Cigars    Quit date: 06/30/2017     Years since quitting: 3.9   Smokeless tobacco: Never   Tobacco comments:    Smokes Marijuana ("here and there") (as needed)  Vaping Use   Vaping Use: Never used  Substance and Sexual Activity   Alcohol use: Yes    Comment: occasionally   Drug use: Yes    Frequency: 7.0 times per week    Types: Marijuana    Comment: Marijuana--once a week   Sexual activity: Yes  Other Topics Concern   Not on file  Social History Narrative   Not on file   Social Determinants of Health   Financial Resource Strain: Not on file  Food Insecurity: Not on file  Transportation Needs: Not on file  Physical Activity: Not on file  Stress: Not on file  Social Connections: Not on file  Intimate Partner Violence: Not on file    Family History  Problem Relation Age of Onset   Diabetes type II Father    Asthma Father    Hypertension Father    Heart murmur Father    Alcohol abuse Father    Drug abuse Father    Hypertension Mother    Alcohol abuse Mother    Drug abuse Mother     ROS: Pain in left wrist but no fevers or chills, productive cough, hemoptysis, dysphasia, odynophagia, melena, hematochezia, dysuria, hematuria, rash, seizure activity, orthopnea, PND, pedal edema, claudication. Remaining systems are negative.  Physical Exam:   Blood pressure 132/80, pulse 93, height 4\' 11"  (1.499 m), weight 220 lb 3.2 oz (99.9 kg), last menstrual period 06/13/2012, SpO2 98 %.  General:  Well developed/obese in NAD Skin warm/dry Patient not depressed No peripheral clubbing Back-normal HEENT-normal/normal eyelids Neck supple/normal carotid upstroke bilaterally; no bruits; no JVD; no thyromegaly chest -diminished breath sounds throughout, no wheeze. CV - RRR/normal S1 and S2; no murmurs, rubs or gallops;  PMI nondisplaced Abdomen -NT/ND, no HSM, no mass, + bowel sounds, no bruit 2+ femoral pulses, no bruits Ext-no edema, chords, 2+ DP Neuro-grossly nonfocal  ECG -April 23, 2001-sinus rhythm  with no ST changes.  Personally reviewed  A/P  1 hypertension-blood pressure controlled.  Continue present medications and follow.  2 chronic diastolic congestive heart failure-she is euvolemic on examination today.  We will continue Lasix at present dose.  She weighs daily and will take an additional 20 mg for weight gain of 2 to 3 pounds.  She also is following a low-sodium diet.  3 chronic respiratory failure-felt to be multifactorial including obstructive sleep apnea, pulmonary venous hypertension, asthma and obesity hypoventilation syndrome.  4 history of sarcoid-follow-up pulmonary.  5 obstructive sleep apnea-continue BiPAP.  6 morbid obesity-we discussed importance of weight loss.  7 coronary calcification-noted on previous CT scan.  We will add statin therapy.  8 hyperlipidemia-last LDL August 2022 182.  Given coronary calcification and diabetes mellitus we will add Crestor 40 mg daily.  Check lipids and liver  in 8 weeks.  We will also check potassium and renal function at that time.  Kirk Ruths, MD

## 2021-06-01 LAB — HEPATIC FUNCTION PANEL
AG Ratio: 1.2 (calc) (ref 1.0–2.5)
ALT: 27 U/L (ref 6–29)
AST: 23 U/L (ref 10–35)
Albumin: 4.3 g/dL (ref 3.6–5.1)
Alkaline phosphatase (APISO): 121 U/L (ref 37–153)
Bilirubin, Direct: 0.1 mg/dL (ref 0.0–0.2)
Globulin: 3.6 g/dL (calc) (ref 1.9–3.7)
Indirect Bilirubin: 0.2 mg/dL (calc) (ref 0.2–1.2)
Total Bilirubin: 0.3 mg/dL (ref 0.2–1.2)
Total Protein: 7.9 g/dL (ref 6.1–8.1)

## 2021-06-01 LAB — RHEUMATOID FACTOR: Rheumatoid fact SerPl-aCnc: <14 IU/mL (ref <14)

## 2021-06-01 LAB — TIQ- AMBIGUOUS ORDER

## 2021-06-01 LAB — SEDIMENTATION RATE

## 2021-06-01 LAB — ANA: Anti Nuclear Antibody (ANA): NEGATIVE

## 2021-06-01 LAB — URIC ACID: Uric Acid, Serum: 10.3 mg/dL — ABNORMAL HIGH (ref 2.5–7.0)

## 2021-06-11 ENCOUNTER — Ambulatory Visit: Payer: Medicare HMO | Admitting: Cardiology

## 2021-06-11 ENCOUNTER — Other Ambulatory Visit: Payer: Self-pay

## 2021-06-11 ENCOUNTER — Encounter: Payer: Self-pay | Admitting: Cardiology

## 2021-06-11 VITALS — BP 132/80 | HR 93 | Ht 59.0 in | Wt 220.2 lb

## 2021-06-11 DIAGNOSIS — I10 Essential (primary) hypertension: Secondary | ICD-10-CM | POA: Diagnosis not present

## 2021-06-11 DIAGNOSIS — E785 Hyperlipidemia, unspecified: Secondary | ICD-10-CM

## 2021-06-11 DIAGNOSIS — I2510 Atherosclerotic heart disease of native coronary artery without angina pectoris: Secondary | ICD-10-CM | POA: Diagnosis not present

## 2021-06-11 DIAGNOSIS — I5032 Chronic diastolic (congestive) heart failure: Secondary | ICD-10-CM | POA: Diagnosis not present

## 2021-06-11 MED ORDER — ROSUVASTATIN CALCIUM 40 MG PO TABS: 40.0000 mg | ORAL_TABLET | Freq: Every day | ORAL | 3 refills | Status: DC

## 2021-06-11 NOTE — Patient Instructions (Signed)
Medication Instructions:   START ROSUVASTATIN 40 MG ONCE DAILY  *If you need a refill on your cardiac medications before your next appointment, please call your pharmacy*   Lab Work:  Your physician recommends that you return for lab work in: [place]  If you have labs (blood work) drawn today and your tests are completely normal, you will receive your results only by: [place] (if you have MyChart) OR A paper copy in the mail If you have any lab test that is abnormal or we need to change your treatment, we will call you to review the results.   Follow-Up: At [facility], you and your health needs are our priority.  As part of our continuing mission to provide you with exceptional heart care, we have created designated Provider Care Teams.  These Care Teams include your primary Cardiologist (physician) and Advanced Practice Providers (APPs -  Physician Assistants and Nurse Practitioners) who all work together to provide you with the care you need, when you need it.  We recommend signing up for the patient portal called "MyChart".  Sign up information is provided on this After Visit Summary.  MyChart is used to connect with patients for Virtual Visits (Telemedicine).  Patients are able to view lab/test results, encounter notes, upcoming appointments, etc.  Non-urgent messages can be sent to your provider as well.   To learn more about what you can do with MyChart, go to [email or web address].    Your next appointment:   6 month(s)  The format for your next appointment:   In Person  Provider:   Kirk Ruths MD

## 2021-07-02 ENCOUNTER — Encounter: Payer: Medicare HMO | Attending: Internal Medicine | Admitting: Dietician

## 2021-07-04 ENCOUNTER — Ambulatory Visit: Payer: Medicare HMO | Admitting: Pulmonary Disease

## 2021-08-17 ENCOUNTER — Ambulatory Visit: Payer: Medicare HMO | Admitting: Dietician

## 2021-08-27 ENCOUNTER — Encounter: Payer: Self-pay | Admitting: Dietician

## 2021-08-27 ENCOUNTER — Other Ambulatory Visit: Payer: Self-pay

## 2021-08-27 ENCOUNTER — Encounter: Payer: Medicare HMO | Attending: Internal Medicine | Admitting: Dietician

## 2021-08-27 DIAGNOSIS — E118 Type 2 diabetes mellitus with unspecified complications: Secondary | ICD-10-CM | POA: Insufficient documentation

## 2021-08-27 NOTE — Progress Notes (Signed)
Diabetes Self-Management Education  Visit Type: First/Initial  Appt. Start Time: 1030 Appt. End Time: 1130  08/27/2021  [name], identified by name and date of birth, is a 56 y.o. female with a diagnosis of Diabetes: Type 2.   ASSESSMENT Patient is here today alone. She states that she is frustrated about what she can eat as there are so many things that she cannot have. She states that at times she becomes mentally overwhelmed, gives up and eats whatever she wishes.  Stress eating and increased appetite on Prednisone.    History includes:  type 2 diabetes, gout, CKD, CHF, HTN, OSA-bipap, sarcoid, oxygen, vitamin D deficiency, neuropathy, Marijuana smoking Labs:  A1C 8.1% 04/23/2021, Vitamin D 18 on 03/23/1010, BUN 52, Creatinine 1.41, Potassium 5.8, Sodaium 138, eGFR 48 onn 05/11/2021, Uric acid 10.3 05/31/2021 Medications include:  Trulicity (has not taken recently due to backorder, Metformin, Synjardy, Toujeo 60 units bid, Lasix (prn), vitamin D Glucose was 140 in the office this am.  Patient lives alone.  She does drive.  She is on disability. She does not exercise as she states that she does not know her limitations.  Went to cardiac rehab in the past and loved it but did not continue due to cost. Ate keto in the past and liked this way of eating (stopped 03/2022). She states that living is a struggle, does not think of hurting herself, wants to live because of her grandchildren and states that  she has purpose.  Lost a cousin 06/2021.  Height 4' 11"  (1.499 m), weight 229 lb (103.9 kg), last menstrual period 06/13/2012. Body mass index is 46.25 kg/m.   Diabetes Self-Management Education - 08/27/21 1001       Visit Information   Visit Type First/Initial      Initial Visit   Diabetes Type Type 2    Are you currently following a meal plan? No    Are you taking your medications as prescribed? Yes    Date Diagnosed 2011      Health Coping   How would you rate  your overall health? Poor      Psychosocial Assessment   Patient Belief/Attitude about Diabetes Denial   denial - good or bad i still have consequences   Self-care barriers None    Self-management support Doctor's office    Other persons present Patient    Patient Concerns Nutrition/Meal planning;Healthy Lifestyle;Weight Control    Special Needs None    Preferred Learning Style No preference indicated    Learning Readiness Ready    How often do you need to have someone help you when you read instructions, pamphlets, or other written materials from your doctor or pharmacy? 1 - Never    What is the last grade level you completed in school? 12      Pre-Education Assessment   Patient understands the diabetes disease and treatment process. Needs Instruction    Patient understands incorporating nutritional management into lifestyle. Needs Instruction    Patient undertands incorporating physical activity into lifestyle. Needs Instruction    Patient understands using medications safely. Needs Instruction    Patient understands monitoring blood glucose, interpreting and using results Needs Instruction    Patient understands prevention, detection, and treatment of acute complications. Needs Instruction    Patient understands prevention, detection, and treatment of chronic complications. Needs Instruction    Patient understands how to develop strategies to address psychosocial issues. Needs Instruction    Patient understands how to develop strategies  to promote health/change behavior. Needs Instruction      Complications   Last HgB A1C per patient/outside source 8.1 %   04/23/2021   How often do you check your blood sugar? 3-4 times/day    Fasting Blood glucose range (mg/dL) 130-179   150   Postprandial Blood glucose range (mg/dL) >200    Number of hypoglycemic episodes per month 0    Number of hyperglycemic episodes per week 21    Can you tell when your blood sugar is high? Yes    What do you  do if your blood sugar is high? drinks water    Have you had a dilated eye exam in the past 12 months? No    Have you had a dental exam in the past 12 months? Yes    Are you checking your feet? Yes    How many days per week are you checking your feet? 2      Dietary Intake   Breakfast cereal (raisin bran or cheerios) OR eggs, 2 strips bacon OR yogurt OR skips    Lunch McDonald's or fast food if she wakes late OR lunch meat sandwhich and chips OR sausage on bread with mustard OR fast food [employer] chicken   11-3   Snack (afternoon) potato chips    Dinner meat, vegetable, starch OR 3 homemade chicken wings, egg roll, 2 onion rings    Snack (evening) regular potato chips or unsalted peanuts or fruit rare sweet when on Prednisone    Beverage(s) water, occasional zero or diet soda (dark soda)      Exercise   Exercise Type ADL's   doesn't know her limitations     Patient Education   Previous Diabetes Education No             Individualized Plan for Diabetes Self-Management Training:   Learning Objective:  Patient will have a greater understanding of diabetes self-management. Patient education plan is to attend individual and/or group sessions per assessed needs and concerns.   Plan:   Patient Instructions  Consider 600 ALA twice per day (can help some with neuropathy)  Discuss your exercise limitations with your MD. Water is best.  Zero or diet soda (sprite, gingerale or other light choices) can be options occasionally.  "Systematic"  Basic meal planning Make choices you can easily make at home rather than eating out. Fresh meat (not processed), fresh or frozen vegetables or canned vegetables without salt or rinsed, fresh fruits.  Consider counseling or [email or web address]  Breakfast ideas: Egg, toast, fruit Peanut butter, toast, fruit Yogurt fruit Egg, grits (unsalted), fruit Leftover chicken, bread, mayo, fruit Egg and swiss sandwich, fruit Smoothie   Expected Outcomes:      Air traffic controller provided: Food label handouts and Meal plan card, low purine diet from AND, Potassium content of food from AND  If problems or questions, patient to contact team via:  Phone  Future DSME appointment:

## 2021-08-27 NOTE — Patient Instructions (Addendum)
Consider 600 ALA twice per day (can help some with neuropathy)  Discuss your exercise limitations with your MD. Water is best.  Zero or diet soda (sprite, gingerale or other light choices) can be options occasionally.  "Systematic"  Basic meal planning Make choices you can easily make at home rather than eating out. Fresh meat (not processed), fresh or frozen vegetables or canned vegetables without salt or rinsed, fresh fruits.  Consider counseling or [email or web address]  Breakfast ideas: Egg, toast, fruit Peanut butter, toast, fruit Yogurt fruit Egg, grits (unsalted), fruit Leftover chicken, bread, mayo, fruit Egg and swiss sandwich, fruit Smoothie

## 2021-08-29 ENCOUNTER — Ambulatory Visit (INDEPENDENT_AMBULATORY_CARE_PROVIDER_SITE_OTHER): Payer: Medicare HMO | Admitting: Pulmonary Disease

## 2021-08-29 ENCOUNTER — Other Ambulatory Visit: Payer: Self-pay

## 2021-08-29 ENCOUNTER — Encounter: Payer: Self-pay | Admitting: Pulmonary Disease

## 2021-08-29 VITALS — BP 138/68 | HR 105 | Temp 97.7°F | Ht 59.0 in | Wt 227.0 lb

## 2021-08-29 DIAGNOSIS — D860 Sarcoidosis of lung: Secondary | ICD-10-CM | POA: Diagnosis not present

## 2021-08-29 DIAGNOSIS — I5032 Chronic diastolic (congestive) heart failure: Secondary | ICD-10-CM | POA: Diagnosis not present

## 2021-08-29 DIAGNOSIS — G4733 Obstructive sleep apnea (adult) (pediatric): Secondary | ICD-10-CM

## 2021-08-29 DIAGNOSIS — E669 Obesity, unspecified: Secondary | ICD-10-CM

## 2021-08-29 DIAGNOSIS — J849 Interstitial pulmonary disease, unspecified: Secondary | ICD-10-CM | POA: Diagnosis not present

## 2021-08-29 DIAGNOSIS — J9611 Chronic respiratory failure with hypoxia: Secondary | ICD-10-CM

## 2021-08-29 DIAGNOSIS — G4730 Sleep apnea, unspecified: Secondary | ICD-10-CM

## 2021-08-29 NOTE — Progress Notes (Signed)
[facility]  Chief Complaint  Patient presents with   Follow-up    Wearing BIPAP-doing good. sob-same.    Past Surgical History:  She  has a past surgical history that includes Tubal ligation.  Past Medical History:  HTN, DM, Depression, anxiety   Constitutional:  BP 138/68 (BP Location: Left Arm, Cuff Size: Normal)    Pulse (!) 105    Temp 97.7 F (36.5 C) (Temporal)    Ht 4\' 11"  (1.499 m)    Wt 227 lb (103 kg)    LMP 06/13/2012    SpO2 92%    BMI 45.85 kg/m   Brief Summary:  Cassandra Burns is a 56 y.o. female with pulmonary sarcoidosis, asthma, obstructive sleep apnea, and chronic respiratory failure.      Subjective:   I last saw her in January 2020.  More recently seen by Derl Barrow and Eric Form.  She was started on prednisone recently by rheumatology for joint pain and swelling.  She stopped smoking marijuana.  Breathing improved after she stopped.  She is trying to get healthier.  She wants to enroll in pulmonary rehab again.  She is planning to modify her diet.  Not having cough, wheeze, sputum.  Not needing to use albuterol much.  Gets winded going up a hill or stairs.  Sleeping okay.  Uses Bipap nightly.  Using 2 liters oxygen 24/7.   Physical Exam:   Appearance - well kempt, wearing oxygen  ENMT - no sinus tenderness, no oral exudate, no LAN, Mallampati 3 airway, no stridor  Respiratory - equal breath sounds bilaterally, no wheezing or rales  CV - s1s2 regular rate and rhythm, no murmurs  Ext - no clubbing, no edema  Skin - no rashes  Psych - normal mood and affect   Pulmonary testing:  EBUS May 2015 >> granulomas PFT February 2015 >> DLCO 60% PFT 11/08/19 >> FEV1 1.68 (55%), FEV1% 84, TLC 4.25 (62%), DLCO 69%  Chest Imaging:  HRCT chest 04/29/17 >> atherosclerosis, patchy nodular thickening of interstitium, mild traction BTX, patchy air trapping, splenomegaly HRCT chest 01/19/20 >> calcified  mediastinal and hilar nodes, patchy traction BTX with interstitial coarsening and upper/mid lung predominance progressed since 2018, air trapping  Sleep Tests:  BiPAP 07/29/21 to 08/27/21 >> used on 29 of 30 nights with average 8 hrs 36 min.  Average AHI 0.5 with Bipap 16/12 cm H2O  Cardiac Tests:  Echo 04/24/21 >> EF 65 to 70%, severe LVH, grade 1 DD  Social History:  She  reports that she quit smoking about 4 years ago. Her smoking use included cigars. She has never used smokeless tobacco. She reports current alcohol use. She reports current drug use. Frequency: 7.00 times per week. Drug: Marijuana.  Family History:  Her family history includes Alcohol abuse in her father and mother; Asthma in her father; Diabetes type II in her father; Drug abuse in her father and mother; Heart murmur in her father; Hypertension in her father and mother.     Assessment/Plan:   Systemic sarcoidosis. - followed by Dr. Sabino Niemann with [facility] medical rheumatology - on prednisone currently for joint symptoms   Persistent asthma. - symbicort 160 two puffs bid - prn albuterol   Obstructive sleep apnea. - she is compliant with Bipap and reports benefit - uses [employer] for her DME - continue Bipap 16/12 cm H2O  Chronic hypoxic respiratory failure. - related to sarcoidosis, and obesity hypoventilation syndrome - continue  2 liters 24/7 - goal SpO2 > 38%  Chronic diastolic CHF. - followed by Dr. Kirk Ruths with [facility]   Obesity with deconditioning. - reviewed options to assist with diet modification - will refer back to pulmonary rehab  Time Spent Involved in Patient Care on Day of Examination:  38 minutes  Follow up:   Patient Instructions  Will arrange for referral to pulmonary rehab  Follow up in 6 months  Medication List:   Allergies as of 08/29/2021   No Known Allergies      Medication List        Accurate as of August 29, 2021 11:14 AM. If you have any  questions, ask your nurse or doctor.          albuterol (2.5 MG/3ML) 0.083% nebulizer solution Commonly known as: PROVENTIL Take 3 mLs (2.5 mg total) by nebulization every 6 (six) hours as needed for wheezing or shortness of breath.   albuterol 108 (90 Base) MCG/ACT inhaler Commonly known as: VENTOLIN HFA Inhale 1-2 puffs into the lungs every 6 (six) hours as needed for wheezing or shortness of breath.   allopurinol 300 MG tablet Commonly known as: ZYLOPRIM Take 300 mg by mouth daily.   amLODipine 10 MG tablet Commonly known as: NORVASC Take 1 tablet (10 mg total) by mouth daily. What changed: when to take this   budesonide-formoterol 160-4.5 MCG/ACT inhaler Commonly known as: SYMBICORT Inhale 2 puffs into the lungs 2 (two) times daily.   fluticasone 50 MCG/ACT nasal spray Commonly known as: FLONASE Place 1 spray into both nostrils daily as needed for allergies or rhinitis.   furosemide 20 MG tablet Commonly known as: Lasix Take 1 tablet (20 mg total) by mouth daily.   gabapentin 800 MG tablet Commonly known as: NEURONTIN Take 800 mg by mouth 3 (three) times daily.   ibuprofen 800 MG tablet Commonly known as: ADVIL Take 1 tablet (800 mg total) by mouth 3 (three) times daily. What changed:  when to take this reasons to take this   labetalol 100 MG tablet Commonly known as: NORMODYNE Take 100 mg by mouth daily after lunch.   losartan-hydrochlorothiazide 100-25 MG tablet Commonly known as: HYZAAR Take 1 tablet by mouth daily after lunch.   metFORMIN 1000 MG tablet Commonly known as: GLUCOPHAGE Take 1,000 mg by mouth See admin instructions. Take one tablet (1000 mg) by mouth twice daily - after lunch and after supper   OXYGEN Inhale 2-3 L into the lungs continuous.   predniSONE 5 MG tablet Commonly known as: DELTASONE Take 5 mg by mouth daily with breakfast. Taking 1 pill every other day.   rosuvastatin 40 MG tablet Commonly known as: CRESTOR Take 1  tablet (40 mg total) by mouth daily.   SYNJARDY PO Take by mouth.   Toujeo SoloStar 300 UNIT/ML Solostar Pen Generic drug: insulin glargine (1 Unit Dial) Inject 40 Units into the skin 2 (two) times daily after a meal.   Trulicity 3 VF/6.4PP Sopn Generic drug: Dulaglutide Inject 3 mg into the skin every Sunday.   Vitamin D (Ergocalciferol) 1.25 MG (50000 UNIT) Caps capsule Commonly known as: DRISDOL Take 50,000 Units by mouth every Monday.        Signature:  Chesley Mires, MD [place] Pager - [phone] 08/29/2021, 11:14 AM

## 2021-08-29 NOTE — Patient Instructions (Signed)
Will arrange for referral to pulmonary rehab  Follow up in 6 months 

## 2021-08-31 ENCOUNTER — Encounter: Payer: Self-pay | Admitting: *Deleted

## 2021-09-10 ENCOUNTER — Encounter (HOSPITAL_COMMUNITY): Payer: Self-pay | Admitting: *Deleted

## 2021-09-10 NOTE — Progress Notes (Addendum)
Received referral from Dr. Halford Chessman for this pt to participate in pulmonary rehab with the diagnosis of Pulmonary Sarcoidosis.  Pt also has history of Sarcoidosis.  Clinical review of pt follow up appt on 2/1 Pulmonary office note.  Also reviewed progress note from Dr. Stanford Breed - Cardiology.  Pt with Covid Risk Score - 6. Pt appropriate for scheduling for Pulmonary rehab.  Will forward to support staff for scheduling and verification of insurance eligibility/benefits with pt consent. Cherre Huger, BSN Cardiac and Training and development officer

## 2021-09-27 ENCOUNTER — Encounter (HOSPITAL_COMMUNITY): Payer: Self-pay

## 2021-09-27 ENCOUNTER — Emergency Department (HOSPITAL_COMMUNITY): Payer: Medicare HMO

## 2021-09-27 ENCOUNTER — Inpatient Hospital Stay (HOSPITAL_COMMUNITY)
Admission: EM | Admit: 2021-09-27 | Discharge: 2021-09-30 | DRG: 871 | Disposition: A | Discharge status: Home / Self Care | Payer: Medicare HMO | Attending: Family Medicine | Admitting: Family Medicine

## 2021-09-27 ENCOUNTER — Other Ambulatory Visit: Payer: Self-pay

## 2021-09-27 DIAGNOSIS — J189 Pneumonia, unspecified organism: Secondary | ICD-10-CM | POA: Diagnosis present

## 2021-09-27 DIAGNOSIS — E785 Hyperlipidemia, unspecified: Secondary | ICD-10-CM | POA: Diagnosis present

## 2021-09-27 DIAGNOSIS — T402X5A Adverse effect of other opioids, initial encounter: Secondary | ICD-10-CM | POA: Diagnosis not present

## 2021-09-27 DIAGNOSIS — Z6841 Body Mass Index (BMI) 40.0 and over, adult: Secondary | ICD-10-CM | POA: Diagnosis not present

## 2021-09-27 DIAGNOSIS — Z20822 Contact with and (suspected) exposure to covid-19: Secondary | ICD-10-CM | POA: Diagnosis present

## 2021-09-27 DIAGNOSIS — E1140 Type 2 diabetes mellitus with diabetic neuropathy, unspecified: Secondary | ICD-10-CM | POA: Diagnosis present

## 2021-09-27 DIAGNOSIS — M109 Gout, unspecified: Secondary | ICD-10-CM | POA: Diagnosis present

## 2021-09-27 DIAGNOSIS — I110 Hypertensive heart disease with heart failure: Secondary | ICD-10-CM | POA: Diagnosis present

## 2021-09-27 DIAGNOSIS — D869 Sarcoidosis, unspecified: Secondary | ICD-10-CM | POA: Diagnosis present

## 2021-09-27 DIAGNOSIS — R112 Nausea with vomiting, unspecified: Secondary | ICD-10-CM | POA: Diagnosis not present

## 2021-09-27 DIAGNOSIS — J9611 Chronic respiratory failure with hypoxia: Secondary | ICD-10-CM | POA: Diagnosis present

## 2021-09-27 DIAGNOSIS — F419 Anxiety disorder, unspecified: Secondary | ICD-10-CM | POA: Diagnosis present

## 2021-09-27 DIAGNOSIS — J9621 Acute and chronic respiratory failure with hypoxia: Secondary | ICD-10-CM | POA: Diagnosis present

## 2021-09-27 DIAGNOSIS — R778 Other specified abnormalities of plasma proteins: Secondary | ICD-10-CM | POA: Diagnosis not present

## 2021-09-27 DIAGNOSIS — G4733 Obstructive sleep apnea (adult) (pediatric): Secondary | ICD-10-CM | POA: Diagnosis present

## 2021-09-27 DIAGNOSIS — E662 Morbid (severe) obesity with alveolar hypoventilation: Secondary | ICD-10-CM | POA: Diagnosis present

## 2021-09-27 DIAGNOSIS — Z833 Family history of diabetes mellitus: Secondary | ICD-10-CM

## 2021-09-27 DIAGNOSIS — F32A Depression, unspecified: Secondary | ICD-10-CM | POA: Diagnosis present

## 2021-09-27 DIAGNOSIS — Y92239 Unspecified place in hospital as the place of occurrence of the external cause: Secondary | ICD-10-CM | POA: Diagnosis not present

## 2021-09-27 DIAGNOSIS — Z7951 Long term (current) use of inhaled steroids: Secondary | ICD-10-CM

## 2021-09-27 DIAGNOSIS — Z79899 Other long term (current) drug therapy: Secondary | ICD-10-CM

## 2021-09-27 DIAGNOSIS — Z825 Family history of asthma and other chronic lower respiratory diseases: Secondary | ICD-10-CM

## 2021-09-27 DIAGNOSIS — M79604 Pain in right leg: Secondary | ICD-10-CM | POA: Diagnosis not present

## 2021-09-27 DIAGNOSIS — Z87891 Personal history of nicotine dependence: Secondary | ICD-10-CM | POA: Diagnosis not present

## 2021-09-27 DIAGNOSIS — J440 Chronic obstructive pulmonary disease with acute lower respiratory infection: Secondary | ICD-10-CM | POA: Diagnosis present

## 2021-09-27 DIAGNOSIS — E1165 Type 2 diabetes mellitus with hyperglycemia: Secondary | ICD-10-CM

## 2021-09-27 DIAGNOSIS — I248 Other forms of acute ischemic heart disease: Secondary | ICD-10-CM | POA: Diagnosis present

## 2021-09-27 DIAGNOSIS — E1149 Type 2 diabetes mellitus with other diabetic neurological complication: Secondary | ICD-10-CM | POA: Diagnosis not present

## 2021-09-27 DIAGNOSIS — Z7952 Long term (current) use of systemic steroids: Secondary | ICD-10-CM

## 2021-09-27 DIAGNOSIS — E875 Hyperkalemia: Secondary | ICD-10-CM | POA: Diagnosis present

## 2021-09-27 DIAGNOSIS — A419 Sepsis, unspecified organism: Secondary | ICD-10-CM | POA: Diagnosis present

## 2021-09-27 DIAGNOSIS — N179 Acute kidney failure, unspecified: Secondary | ICD-10-CM | POA: Diagnosis present

## 2021-09-27 DIAGNOSIS — R0603 Acute respiratory distress: Secondary | ICD-10-CM

## 2021-09-27 DIAGNOSIS — R400 Somnolence: Secondary | ICD-10-CM | POA: Diagnosis not present

## 2021-09-27 DIAGNOSIS — Z8249 Family history of ischemic heart disease and other diseases of the circulatory system: Secondary | ICD-10-CM

## 2021-09-27 DIAGNOSIS — R7989 Other specified abnormal findings of blood chemistry: Secondary | ICD-10-CM | POA: Diagnosis present

## 2021-09-27 DIAGNOSIS — M79606 Pain in leg, unspecified: Secondary | ICD-10-CM

## 2021-09-27 DIAGNOSIS — G894 Chronic pain syndrome: Secondary | ICD-10-CM | POA: Diagnosis present

## 2021-09-27 DIAGNOSIS — I5032 Chronic diastolic (congestive) heart failure: Secondary | ICD-10-CM | POA: Diagnosis present

## 2021-09-27 DIAGNOSIS — E119 Type 2 diabetes mellitus without complications: Secondary | ICD-10-CM

## 2021-09-27 LAB — GLUCOSE, CAPILLARY
Glucose-Capillary: 263 mg/dL — ABNORMAL HIGH (ref 70–99)
Glucose-Capillary: 383 mg/dL — ABNORMAL HIGH (ref 70–99)
Glucose-Capillary: 500 mg/dL — ABNORMAL HIGH (ref 70–99)
Glucose-Capillary: 457 mg/dL — ABNORMAL HIGH (ref 70–99)

## 2021-09-27 LAB — BLOOD GAS, VENOUS
Acid-base deficit: 0.5 mmol/L (ref 0.0–2.0)
Bicarbonate: 24.2 mmol/L (ref 20.0–28.0)
O2 Saturation: 88 %
Patient temperature: 37
pCO2, Ven: 39 mmHg — ABNORMAL LOW (ref 44–60)
pH, Ven: 7.4 (ref 7.25–7.43)
pO2, Ven: 57 mmHg — ABNORMAL HIGH (ref 32–45)

## 2021-09-27 LAB — CBC WITH DIFFERENTIAL/PLATELET
Abs Immature Granulocytes: 0.09 10*3/uL — ABNORMAL HIGH (ref 0.00–0.07)
Basophils Absolute: 0 10*3/uL (ref 0.0–0.1)
Basophils Relative: 0 %
Eosinophils Absolute: 0 10*3/uL (ref 0.0–0.5)
Eosinophils Relative: 0 %
HCT: 36.9 % (ref 36.0–46.0)
Hemoglobin: 10.9 g/dL — ABNORMAL LOW (ref 12.0–15.0)
Immature Granulocytes: 1 %
Lymphocytes Relative: 7 %
Lymphs Abs: 1.3 10*3/uL (ref 0.7–4.0)
MCH: 20.8 pg — ABNORMAL LOW (ref 26.0–34.0)
MCHC: 29.5 g/dL — ABNORMAL LOW (ref 30.0–36.0)
MCV: 70.6 fL — ABNORMAL LOW (ref 80.0–100.0)
Monocytes Absolute: 1.2 10*3/uL — ABNORMAL HIGH (ref 0.1–1.0)
Monocytes Relative: 7 %
Neutro Abs: 15.6 10*3/uL — ABNORMAL HIGH (ref 1.7–7.7)
Neutrophils Relative %: 85 %
Platelets: 285 10*3/uL (ref 150–400)
RBC: 5.23 MIL/uL — ABNORMAL HIGH (ref 3.87–5.11)
RDW: 19.9 % — ABNORMAL HIGH (ref 11.5–15.5)
WBC: 18.2 10*3/uL — ABNORMAL HIGH (ref 4.0–10.5)
nRBC: 0 % (ref 0.0–0.2)

## 2021-09-27 LAB — COMPREHENSIVE METABOLIC PANEL
ALT: 37 U/L (ref 0–44)
AST: 31 U/L (ref 15–41)
Albumin: 4.1 g/dL (ref 3.5–5.0)
Alkaline Phosphatase: 102 U/L (ref 38–126)
Anion gap: 10 (ref 5–15)
BUN: 28 mg/dL — ABNORMAL HIGH (ref 6–20)
CO2: 23 mmol/L (ref 22–32)
Calcium: 9.4 mg/dL (ref 8.9–10.3)
Chloride: 101 mmol/L (ref 98–111)
Creatinine, Ser: 1.51 mg/dL — ABNORMAL HIGH (ref 0.44–1.00)
GFR, Estimated: 40 mL/min — ABNORMAL LOW (ref >60)
Glucose, Bld: 167 mg/dL — ABNORMAL HIGH (ref 70–99)
Potassium: 4 mmol/L (ref 3.5–5.1)
Sodium: 134 mmol/L — ABNORMAL LOW (ref 135–145)
Total Bilirubin: 0.4 mg/dL (ref 0.3–1.2)
Total Protein: 8.5 g/dL — ABNORMAL HIGH (ref 6.5–8.1)

## 2021-09-27 LAB — BASIC METABOLIC PANEL
Anion gap: 12 (ref 5–15)
BUN: 28 mg/dL — ABNORMAL HIGH (ref 6–20)
CO2: 22 mmol/L (ref 22–32)
Calcium: 9.1 mg/dL (ref 8.9–10.3)
Chloride: 100 mmol/L (ref 98–111)
Creatinine, Ser: 1.58 mg/dL — ABNORMAL HIGH (ref 0.44–1.00)
GFR, Estimated: 38 mL/min — ABNORMAL LOW (ref >60)
Glucose, Bld: 259 mg/dL — ABNORMAL HIGH (ref 70–99)
Potassium: 4.6 mmol/L (ref 3.5–5.1)
Sodium: 134 mmol/L — ABNORMAL LOW (ref 135–145)

## 2021-09-27 LAB — CBC
HCT: 37.7 % (ref 36.0–46.0)
Hemoglobin: 10.5 g/dL — ABNORMAL LOW (ref 12.0–15.0)
MCH: 20.3 pg — ABNORMAL LOW (ref 26.0–34.0)
MCHC: 27.9 g/dL — ABNORMAL LOW (ref 30.0–36.0)
MCV: 72.8 fL — ABNORMAL LOW (ref 80.0–100.0)
Platelets: 277 10*3/uL (ref 150–400)
RBC: 5.18 MIL/uL — ABNORMAL HIGH (ref 3.87–5.11)
RDW: 19.4 % — ABNORMAL HIGH (ref 11.5–15.5)
WBC: 20.9 10*3/uL — ABNORMAL HIGH (ref 4.0–10.5)
nRBC: 0 % (ref 0.0–0.2)

## 2021-09-27 LAB — HEMOGLOBIN A1C
Hgb A1c MFr Bld: 10.2 % — ABNORMAL HIGH (ref 4.8–5.6)
Mean Plasma Glucose: 246.04 mg/dL

## 2021-09-27 LAB — TROPONIN I (HIGH SENSITIVITY)
Troponin I (High Sensitivity): 29 ng/L — ABNORMAL HIGH (ref <18)
Troponin I (High Sensitivity): 31 ng/L — ABNORMAL HIGH (ref <18)

## 2021-09-27 LAB — RESP PANEL BY RT-PCR (FLU A&B, COVID) ARPGX2
Influenza A by PCR: NEGATIVE
Influenza B by PCR: NEGATIVE
SARS Coronavirus 2 by RT PCR: NEGATIVE

## 2021-09-27 LAB — BRAIN NATRIURETIC PEPTIDE: B Natriuretic Peptide: 80.3 pg/mL (ref 0.0–100.0)

## 2021-09-27 LAB — STREP PNEUMONIAE URINARY ANTIGEN: Strep Pneumo Urinary Antigen: NEGATIVE

## 2021-09-27 LAB — MRSA NEXT GEN BY PCR, NASAL: MRSA by PCR Next Gen: NOT DETECTED

## 2021-09-27 LAB — PROCALCITONIN: Procalcitonin: 0.5 ng/mL

## 2021-09-27 LAB — LACTIC ACID, PLASMA: Lactic Acid, Venous: 1.2 mmol/L (ref 0.5–1.9)

## 2021-09-27 MED ORDER — INSULIN ASPART 100 UNIT/ML IJ SOLN: 0.0000 [IU] | Freq: Three times a day (TID) | INTRAMUSCULAR | Status: DC

## 2021-09-27 MED ORDER — ALLOPURINOL 100 MG PO TABS
200.0000 mg | ORAL_TABLET | Freq: Every day | ORAL | Status: DC
  Administered 2021-09-27 – 2021-09-30 (×4): 200 mg via ORAL
  Filled 2021-09-27 (×4): qty 2

## 2021-09-27 MED ORDER — ACETAMINOPHEN 325 MG PO TABS
650.0000 mg | ORAL_TABLET | Freq: Four times a day (QID) | ORAL | Status: DC | PRN
  Administered 2021-09-29 – 2021-09-30 (×3): 650 mg via ORAL
  Filled 2021-09-27 (×3): qty 2

## 2021-09-27 MED ORDER — ROSUVASTATIN CALCIUM 10 MG PO TABS
40.0000 mg | ORAL_TABLET | Freq: Every day | ORAL | Status: DC
  Administered 2021-09-27 – 2021-09-30 (×4): 40 mg via ORAL
  Filled 2021-09-27 (×2): qty 2
  Filled 2021-09-27 (×2): qty 4

## 2021-09-27 MED ORDER — SODIUM CHLORIDE 0.9 % IV SOLN: INTRAVENOUS | Status: DC | PRN

## 2021-09-27 MED ORDER — FUROSEMIDE 10 MG/ML IJ SOLN
20.0000 mg | Freq: Once | INTRAMUSCULAR | Status: AC
  Administered 2021-09-27: 20 mg via INTRAVENOUS
  Filled 2021-09-27: qty 2

## 2021-09-27 MED ORDER — IPRATROPIUM-ALBUTEROL 0.5-2.5 (3) MG/3ML IN SOLN: 3.0000 mL | Freq: Three times a day (TID) | RESPIRATORY_TRACT | Status: DC

## 2021-09-27 MED ORDER — ONDANSETRON HCL 4 MG/2ML IJ SOLN
4.0000 mg | Freq: Once | INTRAMUSCULAR | Status: AC
  Administered 2021-09-27: 4 mg via INTRAVENOUS
  Filled 2021-09-27: qty 2

## 2021-09-27 MED ORDER — INSULIN ASPART 100 UNIT/ML IJ SOLN
0.0000 [IU] | Freq: Three times a day (TID) | INTRAMUSCULAR | Status: DC
  Administered 2021-09-27: 20 [IU] via SUBCUTANEOUS
  Administered 2021-09-28: 11 [IU] via SUBCUTANEOUS
  Administered 2021-09-28 (×2): 15 [IU] via SUBCUTANEOUS
  Administered 2021-09-29 (×2): 11 [IU] via SUBCUTANEOUS
  Administered 2021-09-29: 7 [IU] via SUBCUTANEOUS
  Administered 2021-09-30: 11 [IU] via SUBCUTANEOUS
  Administered 2021-09-30 (×2): 4 [IU] via SUBCUTANEOUS

## 2021-09-27 MED ORDER — INSULIN ASPART 100 UNIT/ML IJ SOLN
2.0000 [IU] | Freq: Once | INTRAMUSCULAR | Status: AC
  Administered 2021-09-27: 2 [IU] via SUBCUTANEOUS

## 2021-09-27 MED ORDER — INSULIN ASPART 100 UNIT/ML IJ SOLN
5.0000 [IU] | Freq: Three times a day (TID) | INTRAMUSCULAR | Status: DC
  Administered 2021-09-28 – 2021-09-30 (×9): 5 [IU] via SUBCUTANEOUS

## 2021-09-27 MED ORDER — IPRATROPIUM BROMIDE 0.02 % IN SOLN
0.5000 mg | Freq: Once | RESPIRATORY_TRACT | Status: AC
  Administered 2021-09-27: 0.5 mg via RESPIRATORY_TRACT
  Filled 2021-09-27: qty 2.5

## 2021-09-27 MED ORDER — ENOXAPARIN SODIUM 40 MG/0.4ML IJ SOSY
40.0000 mg | PREFILLED_SYRINGE | INTRAMUSCULAR | Status: DC
  Administered 2021-09-27 – 2021-09-30 (×4): 40 mg via SUBCUTANEOUS
  Filled 2021-09-27 (×4): qty 0.4

## 2021-09-27 MED ORDER — INSULIN ASPART 100 UNIT/ML IJ SOLN
0.0000 [IU] | Freq: Three times a day (TID) | INTRAMUSCULAR | Status: DC
  Administered 2021-09-27: 7 [IU] via SUBCUTANEOUS
  Administered 2021-09-27: 5 [IU] via SUBCUTANEOUS

## 2021-09-27 MED ORDER — IPRATROPIUM-ALBUTEROL 0.5-2.5 (3) MG/3ML IN SOLN
3.0000 mL | Freq: Three times a day (TID) | RESPIRATORY_TRACT | Status: DC
  Administered 2021-09-27 – 2021-09-30 (×10): 3 mL via RESPIRATORY_TRACT
  Filled 2021-09-27 (×10): qty 3

## 2021-09-27 MED ORDER — GABAPENTIN 400 MG PO CAPS
800.0000 mg | ORAL_CAPSULE | Freq: Three times a day (TID) | ORAL | Status: DC
Start: 2021-09-27 — End: 2021-09-30
  Administered 2021-09-27 – 2021-09-30 (×10): 800 mg via ORAL
  Filled 2021-09-27 (×10): qty 2

## 2021-09-27 MED ORDER — SODIUM CHLORIDE 0.9 % IV SOLN
500.0000 mg | Freq: Once | INTRAVENOUS | Status: AC
  Administered 2021-09-27: 500 mg via INTRAVENOUS
  Filled 2021-09-27: qty 5

## 2021-09-27 MED ORDER — INSULIN GLARGINE-YFGN 100 UNIT/ML ~~LOC~~ SOLN
20.0000 [IU] | Freq: Every day | SUBCUTANEOUS | Status: DC
  Administered 2021-09-27 – 2021-09-30 (×4): 20 [IU] via SUBCUTANEOUS
  Filled 2021-09-27 (×4): qty 0.2

## 2021-09-27 MED ORDER — ALBUTEROL SULFATE (2.5 MG/3ML) 0.083% IN NEBU
10.0000 mg/h | INHALATION_SOLUTION | RESPIRATORY_TRACT | Status: DC
Start: 2021-09-27 — End: 2021-09-27
  Administered 2021-09-27: 10 mg/h via RESPIRATORY_TRACT
  Filled 2021-09-27: qty 12
  Filled 2021-09-27: qty 3

## 2021-09-27 MED ORDER — INSULIN ASPART 100 UNIT/ML IJ SOLN
0.0000 [IU] | Freq: Every day | INTRAMUSCULAR | Status: DC
  Administered 2021-09-27: 21:00:00 5 [IU] via SUBCUTANEOUS
  Administered 2021-09-28: 4 [IU] via SUBCUTANEOUS
  Administered 2021-09-29: 5 [IU] via SUBCUTANEOUS

## 2021-09-27 MED ORDER — SODIUM CHLORIDE 0.9 % IV SOLN
1.0000 g | Freq: Once | INTRAVENOUS | Status: AC
  Administered 2021-09-27: 1 g via INTRAVENOUS
  Filled 2021-09-27: qty 10

## 2021-09-27 MED ORDER — METHYLPREDNISOLONE SODIUM SUCC 125 MG IJ SOLR
125.0000 mg | Freq: Once | INTRAMUSCULAR | Status: AC
  Administered 2021-09-27: 125 mg via INTRAVENOUS
  Filled 2021-09-27: qty 2

## 2021-09-27 MED ORDER — SODIUM CHLORIDE 0.9 % IV SOLN
1.0000 g | INTRAVENOUS | Status: DC
  Administered 2021-09-27 – 2021-09-29 (×3): 1 g via INTRAVENOUS
  Filled 2021-09-27 (×4): qty 10

## 2021-09-27 MED ORDER — ORAL CARE MOUTH RINSE
15.0000 mL | Freq: Two times a day (BID) | OROMUCOSAL | Status: DC
  Administered 2021-09-27 – 2021-09-30 (×4): 15 mL via OROMUCOSAL

## 2021-09-27 MED ORDER — ACETAMINOPHEN 650 MG RE SUPP: 650.0000 mg | Freq: Four times a day (QID) | RECTAL | Status: DC | PRN

## 2021-09-27 MED ORDER — GUAIFENESIN 100 MG/5ML PO LIQD
5.0000 mL | ORAL | Status: DC | PRN
  Filled 2021-09-27: qty 10

## 2021-09-27 MED ORDER — CHLORHEXIDINE GLUCONATE CLOTH 2 % EX PADS
6.0000 | MEDICATED_PAD | Freq: Every day | CUTANEOUS | Status: DC
  Administered 2021-09-27 (×2): 6 via TOPICAL

## 2021-09-27 MED ORDER — SODIUM CHLORIDE 0.9 % IV SOLN
500.0000 mg | INTRAVENOUS | Status: DC
  Administered 2021-09-27 – 2021-09-29 (×2): 500 mg via INTRAVENOUS
  Filled 2021-09-27 (×2): qty 5

## 2021-09-27 MED ORDER — PREDNISONE 20 MG PO TABS
40.0000 mg | ORAL_TABLET | Freq: Every day | ORAL | Status: DC
  Administered 2021-09-27 – 2021-09-30 (×4): 40 mg via ORAL
  Filled 2021-09-27 (×4): qty 2

## 2021-09-27 MED ORDER — GUAIFENESIN ER 600 MG PO TB12
600.0000 mg | ORAL_TABLET | Freq: Two times a day (BID) | ORAL | Status: DC
  Administered 2021-09-27 – 2021-09-30 (×7): 600 mg via ORAL
  Filled 2021-09-27 (×7): qty 1

## 2021-09-27 NOTE — Assessment & Plan Note (Signed)
Continue gabapentin.

## 2021-09-27 NOTE — Progress Notes (Signed)
Subjective: ?Patient admitted this morning, see detailed H&P by Dr Marlowe Sax ?56 year old female with past medical history of sarcoidosis, asthma, OSA/OHS, chronic hypoxemic respiratory failure on 2 to 3 L/min of oxygen during the day and BiPAP at night, chronic diastolic CHF, class III obesity, hypertension, hyperlipidemia, diabetes mellitus type 2, depression, anxiety presented with shortness of breath, cough and fever.  Chest x-ray shows bilateral basilar opacities left more than right.  Patient started on Solu-Medrol, ceftriaxone and Zithromax. ? ? ? ?Vitals:  ? 09/27/21 1600 09/27/21 1700  ?BP: (!) 133/100 (!) 151/85  ?Pulse: (!) 106 (!) 102  ?Resp: 17 13  ?Temp:  98.2 ?F (36.8 ?C)  ?SpO2: 93% 97%  ? ? ? ? ?A/P ? ?Acute on chronic hypoxemic respiratory failure ?-Community-acquired pneumonia versus acute on chronic diastolic CHF ?-Started on antibiotics ?-DuoNebs every 8 hours, prednisone ?-Also given Lasix 20 mg IV x1 ? ?Uncontrolled diabetes mellitus type 2 ?-CBG elevated to 500 due to being on prednisone ?-Patient started on Lantus 20 units subcu daily ?-We will change sliding scale to resistant sliding scale ?-Also add NovoLog 5 units 3 times daily meal coverage ? ?Acute kidney injury ?-Baseline creatinine 1.5 ?-This morning creatinine is 1.58 ?-Patient received Lasix 20 mg IV x1 ?-Follow BMP in am ? ?Hyperlipidemia ?-Continue Crestor ? ?Diabetic neuropathy ?-Continue gabapentin ? ?Gout ?Continue allopurinol ? ? ?Oswald Hillock ?[facility] ?Pager- [phone]  ?

## 2021-09-27 NOTE — H&P (Addendum)
History and Physical    Cassandra Burns SWN:[id number] DOB: September 19, 1965 DOA: 09/27/2021  DOS: the patient was seen and examined on 09/27/2021  PCP: [name], MD   Patient coming from: Home  I have personally briefly reviewed patient's old medical records in [place]  Patient is a 56 year old female with a past medical history of sarcoidosis, asthma, OSA/OHS, chronic hypoxic respiratory failure on 2-3 L oxygen during the day and BiPAP at night, chronic diastolic CHF, class III obesity, hypertension, hyperlipidemia, type 2 diabetes, depression, anxiety, marijuana use presented to the ED for evaluation of shortness of breath, cough, and fever.  In the ED, patient was tachycardic and tachypneic.  Placed on 4 to 5 L supplemental oxygen.  Not febrile.  Labs showing WBC 18.2.  Hemoglobin 10.9, stable.  BUN 28, creatinine 1.5 (baseline 1.0-1.3).  COVID and influenza PCR negative.  High-sensitivity troponin 29 >31.  No EKG done.  BNP normal.  VBG without evidence of hypercapnia.  Chest x-ray showing bilateral basilar opacities, L>R. Patient was given bronchodilator treatments, Solu-Medrol, ceftriaxone, and azithromycin.  Patient reports 3-day history of increasing shortness of breath and productive cough.  Also having some wheezing.  Her chest hurts only when she coughs but not otherwise.  Reports temperature of 100 F at home yesterday.  She takes Lasix only as needed and has not taken it in a week.  Reports orthopnea.  No lower extremity edema.  States her weight keeps fluctuating.  She is using Symbicort as needed and albuterol as needed.  States she normally uses 2 L oxygen at rest and 3 L with exertion during daytime and uses BiPAP at night.  For the past few days, she is requiring up to 4 L with exertion.    Review of Systems:  Review of Systems  All other systems reviewed and are negative.  Past Medical History:  Diagnosis Date   Anxiety    Asthma    Bone pain 06/07/2013    Depression    Diabetes mellitus 12/18/2012   NIDDM   Hernia of abdominal cavity 10/25/2013   History of anemia 07/26/2013   Hypertension    Iron deficiency anemia due to chronic blood loss    Lymphadenopathy 06/07/2013   Obesity 12/18/2012   Psoriasis    Sarcoidosis 07/26/2013   Sleep apnea    uses bipap    Past Surgical History:  Procedure Laterality Date   TUBAL LIGATION       reports that she quit smoking about 4 years ago. Her smoking use included cigars. She has never used smokeless tobacco. She reports current alcohol use. She reports current drug use. Frequency: 7.00 times per week. Drug: Marijuana.  No Known Allergies  Family History  Problem Relation Age of Onset   Diabetes type II Father    Asthma Father    Hypertension Father    Heart murmur Father    Alcohol abuse Father    Drug abuse Father    Hypertension Mother    Alcohol abuse Mother    Drug abuse Mother     Prior to Admission medications   Medication Sig Start Date End Date Taking? Authorizing Provider  albuterol (PROVENTIL) (2.5 MG/3ML) 0.083% nebulizer solution Take 3 mLs (2.5 mg total) by nebulization every 6 (six) hours as needed for wheezing or shortness of breath. 05/19/17  Yes Magdalen Spatz, NP  albuterol (VENTOLIN HFA) 108 (90 Base) MCG/ACT inhaler Inhale 1-2 puffs into the lungs every 6 (six) hours as needed  for wheezing or shortness of breath. 11/09/19  Yes Martyn Ehrich, NP  allopurinol (ZYLOPRIM) 100 MG tablet Take 200 mg by mouth daily.   Yes [provider]  amLODipine (NORVASC) 10 MG tablet Take 1 tablet (10 mg total) by mouth daily. Patient taking differently: Take 10 mg by mouth daily after lunch. 07/13/14  Yes Ollis, Cleaster Corin, NP  budesonide-formoterol (SYMBICORT) 160-4.5 MCG/ACT inhaler Inhale 2 puffs into the lungs 2 (two) times daily. Patient taking differently: Inhale 2 puffs into the lungs daily as needed (for shortness of breath). 09/29/17  Yes Magdalen Spatz, NP   gabapentin (NEURONTIN) 800 MG tablet Take 800 mg by mouth 3 (three) times daily. 09/14/20  Yes [provider]  ibuprofen (ADVIL) 800 MG tablet Take 1 tablet (800 mg total) by mouth 3 (three) times daily. Patient taking differently: Take 800 mg by mouth daily as needed for headache (pain). 09/26/20  Yes White, Adrienne R, NP  labetalol (NORMODYNE) 100 MG tablet Take 100 mg by mouth daily. 10/27/19  Yes [provider]  losartan-hydrochlorothiazide (HYZAAR) 100-25 MG tablet Take 1 tablet by mouth daily after lunch.   Yes [provider]  OXYGEN Inhale 2-3 L into the lungs continuous.   Yes [provider]  predniSONE (DELTASONE) 5 MG tablet Take 5 mg by mouth every other day.   Yes [provider]  rosuvastatin (CRESTOR) 40 MG tablet Take 1 tablet (40 mg total) by mouth daily. 06/11/21 09/28/22 Yes Crenshaw, Denice Bors, MD  SYNJARDY XR 25-1000 MG TB24 Take 1 tablet by mouth daily. 09/25/21  Yes [provider]  TOUJEO SOLOSTAR 300 UNIT/ML Solostar Pen Inject 40 Units into the skin 2 (two) times daily after a meal. Patient taking differently: Inject 50 Units into the skin 2 (two) times daily after a meal. 04/27/21  Yes Mariel Aloe, MD  Vitamin D, Ergocalciferol, (DRISDOL) 1.25 MG (50000 UNIT) CAPS capsule Take 50,000 Units by mouth every Monday.   Yes [provider]  Dulaglutide (TRULICITY) 3 ZR/0.0TM SOPN Inject 3 mg into the skin every Sunday. Patient not taking: Reported on 09/27/2021    [provider]  fluticasone (FLONASE) 50 MCG/ACT nasal spray Place 1 spray into both nostrils daily as needed for allergies or rhinitis. Patient not taking: Reported on 09/27/2021 10/13/19   [provider]  furosemide (LASIX) 20 MG tablet Take 1 tablet (20 mg total) by mouth daily. Patient taking differently: Take 20 mg by mouth daily as needed for edema. 04/27/21 09/28/22  Mariel Aloe, MD    Physical Exam: Vitals:   09/27/21 0300  09/27/21 0400 09/27/21 0435 09/27/21 0500  BP: (!) 149/88 132/83 (!) 156/63 (!) 93/55  Pulse: (!) 129 (!) 116 (!) 115 (!) 110  Resp: (!) 24 (!) 23 (!) 24 (!) 21  Temp:   99.3 F (37.4 C)   TempSrc:   Oral   SpO2: 98% 93% 92% 95%  Weight:   102.2 kg   Height:   4\' 11"  (1.499 m)     Physical Exam Vitals and nursing note reviewed.  Constitutional:      General: She is not in acute distress. HENT:     Head: Normocephalic and atraumatic.  Eyes:     Extraocular Movements: Extraocular movements intact.     Conjunctiva/sclera: Conjunctivae normal.  Cardiovascular:     Rate and Rhythm: Normal rate and regular rhythm.     Pulses: Normal pulses.  Pulmonary:     Effort: Pulmonary  effort is normal. No respiratory distress.     Breath sounds: Rales present. No wheezing.  Abdominal:     General: Bowel sounds are normal. There is distension.     Palpations: Abdomen is soft.     Tenderness: There is no abdominal tenderness. There is no guarding.  Musculoskeletal:        General: No swelling or tenderness.     Cervical back: Normal range of motion and neck supple.  Skin:    General: Skin is warm and dry.  Neurological:     General: No focal deficit present.     Mental Status: She is alert and oriented to person, place, and time.     Labs on Admission: I have personally reviewed following labs and imaging studies  CBC: Recent Labs  Lab 09/27/21 0050  WBC 18.2*  NEUTROABS 15.6*  HGB 10.9*  HCT 36.9  MCV 70.6*  PLT 440   Basic Metabolic Panel: Recent Labs  Lab 09/27/21 0050  NA 134*  K 4.0  CL 101  CO2 23  GLUCOSE 167*  BUN 28*  CREATININE 1.51*  CALCIUM 9.4   GFR: Estimated Creatinine Clearance: 43.9 mL/min (A) (by C-G formula based on SCr of 1.51 mg/dL (H)). Liver Function Tests: Recent Labs  Lab 09/27/21 0050  AST 31  ALT 37  ALKPHOS 102  BILITOT 0.4  PROT 8.5*  ALBUMIN 4.1   No results for input(s): LIPASE, AMYLASE in the last 168 hours. No results for  input(s): AMMONIA in the last 168 hours. Coagulation Profile: No results for input(s): INR, PROTIME in the last 168 hours. Cardiac Enzymes: No results for input(s): CKTOTAL, CKMB, CKMBINDEX, TROPONINI in the last 168 hours. BNP (last 3 results) No results for input(s): PROBNP in the last 8760 hours. HbA1C: No results for input(s): HGBA1C in the last 72 hours. CBG: No results for input(s): GLUCAP in the last 168 hours. Lipid Profile: No results for input(s): CHOL, HDL, LDLCALC, TRIG, CHOLHDL, LDLDIRECT in the last 72 hours. Thyroid Function Tests: No results for input(s): TSH, T4TOTAL, FREET4, T3FREE, THYROIDAB in the last 72 hours. Anemia Panel: No results for input(s): VITAMINB12, FOLATE, FERRITIN, TIBC, IRON, RETICCTPCT in the last 72 hours. Urine analysis:    Component Value Date/Time   COLORURINE YELLOW 06/30/2019 2007   APPEARANCEUR CLEAR 06/30/2019 2007   LABSPEC 1.020 09/26/2020 0852   PHURINE 6.0 09/26/2020 0852   GLUCOSEU NEGATIVE 09/26/2020 0852   HGBUR TRACE (A) 09/26/2020 0852   BILIRUBINUR NEGATIVE 09/26/2020 0852   KETONESUR NEGATIVE 09/26/2020 0852   PROTEINUR 30 (A) 09/26/2020 0852   UROBILINOGEN 0.2 09/26/2020 0852   NITRITE NEGATIVE 09/26/2020 0852   LEUKOCYTESUR NEGATIVE 09/26/2020 0852    Radiological Exams on Admission: I have personally reviewed images DG Chest Port 1 View  Result Date: 09/27/2021 CLINICAL DATA:  Shortness of breath EXAM: PORTABLE CHEST 1 VIEW COMPARISON:  04/26/2021 FINDINGS: Mild cardiomegaly with bilateral basilar predominant opacities, left-greater-than-right. No pneumothorax or sizable pleural effusion. IMPRESSION: Mild cardiomegaly and bilateral basilar predominant opacities, which may indicate pulmonary edema or atelectasis. Electronically Signed   By: Ulyses Jarred M.D.   On: 09/27/2021 01:02    EKG: EKG ordered and currently pending.  Assessment/Plan Principal Problem:   Acute on chronic respiratory failure with hypoxia  (HCC) Active Problems:   AKI (acute kidney injury) (HCC)   Elevated troponin   DM (diabetes mellitus) (HCC)   Gout   Diabetic neuropathy (HCC)   HLD (hyperlipidemia)    Assessment and  Plan: * Acute on chronic respiratory failure with hypoxia (HCC)- (present on admission) Possible CAP vs acute on chronic diastolic CHF Sepsis Patient is currently requiring 4 L supplemental oxygen at rest, normally uses 2 L at rest and 3 L with exertion and BiPAP at night.  Tachycardic and tachypneic on arrival to the ED.  Work of breathing has now improved after bronchodilator treatments and Solu-Medrol. Chest x-ray showing bilateral basilar opacities, L>R.  Suspect pneumonia versus pulmonary edema.  Labs showing leukocytosis but she is on chronic steroids in the setting of sarcoidosis.  Patient reports fever at home but not febrile in the ED.  COVID and influenza PCR negative.  BNP could possibly be falsely normal in the setting of class III obesity.  Patient is endorsing orthopnea and currently not taking Lasix at home. ?Asthma exacerbation, not wheezing at present.  Has underlying OSA/OHS.  VBG without evidence of hypercapnia. -Continue ceftriaxone and azithromycin -DuoNeb every 8 hours -Prednisone 40 mg daily -Mucinex -Order blood cultures -Check lactic acid level -MRSA PCR screen -Strep pneumo and Legionella urinary antigens -Check procalcitonin level. -IV Lasix 20 mg x 1 ordered. Monitor intake and output, daily weights.  Low-sodium diet with fluid restriction. -Continue supplemental oxygen during the day and BiPAP at night  Addendum 09/27/2021 at 7:28 AM: Echo done September 2022 showing EF 65 to 35%, grade 1 diastolic dysfunction   Elevated troponin- (present on admission) Patient is endorsing chest pain only when coughing but not otherwise.  ACS less likely as troponin mildly elevated but stable.  Mild troponin elevation likely due to demand ischemia. -Cardiac monitoring  AKI (acute kidney  injury) ([place])- (present on admission) ?Prenal due to sepsis versus cardiorenal from decompensated CHF.  BUN 28, creatinine 1.5 (baseline 1.0-1.3).  -IV Lasix 20 mg x 1 ordered, repeat labs to check renal function.  Avoid nephrotoxic agents.   DM (diabetes mellitus) (HCC) -Check A1c.  Sliding scale insulin sensitive ACHS.  HLD (hyperlipidemia) -Continue Crestor  Diabetic neuropathy (HCC) -Continue gabapentin  Gout -Continue allopurinol   DVT prophylaxis: Lovenox Code Status: Full Code Family Communication: No family available at this time. Admission status: Inpatient, Step Down Unit It is my clinical opinion that admission to INPATIENT is reasonable and necessary because of the expectation that this patient will require hospital care that crosses at least 2 midnights to treat this condition based on the medical complexity of the problems presented.  Given the aforementioned information, the predictability of an adverse outcome is felt to be significant.   Shela Leff, MD [facility] 09/27/2021, 7:29 AM

## 2021-09-27 NOTE — Subjective & Objective (Addendum)
Patient is a 56 year old female with a past medical history of sarcoidosis, asthma, OSA/OHS, chronic hypoxic respiratory failure on 2-3 L oxygen during the day and BiPAP at night, chronic diastolic CHF, class III obesity, hypertension, hyperlipidemia, type 2 diabetes, depression, anxiety, marijuana use presented to the ED for evaluation of shortness of breath, cough, and fever.  In the ED, patient was tachycardic and tachypneic.  Placed on 4 to 5 L supplemental oxygen.  Not febrile.  Labs showing WBC 18.2.  Hemoglobin 10.9, stable.  BUN 28, creatinine 1.5 (baseline 1.0-1.3).  COVID and influenza PCR negative.  High-sensitivity troponin 29 >31.  No EKG done.  BNP normal.  VBG without evidence of hypercapnia.  Chest x-ray showing bilateral basilar opacities, L>R. Patient was given bronchodilator treatments, Solu-Medrol, ceftriaxone, and azithromycin. ? ?Patient reports 3-day history of increasing shortness of breath and productive cough.  Also having some wheezing.  Her chest hurts only when she coughs but not otherwise.  Reports temperature of 100 ?F at home yesterday.  She takes Lasix only as needed and has not taken it in a week.  Reports orthopnea.  No lower extremity edema.  States her weight keeps fluctuating.  She is using Symbicort as needed and albuterol as needed.  States she normally uses 2 L oxygen at rest and 3 L with exertion during daytime and uses BiPAP at night.  For the past few days, she is requiring up to 4 L with exertion. ? ?

## 2021-09-27 NOTE — ED Notes (Signed)
Report given to Amanda RN

## 2021-09-27 NOTE — ED Notes (Signed)
Patient given fan per request.  ?

## 2021-09-27 NOTE — Assessment & Plan Note (Addendum)
-  Patient was having  chest pain only when coughing but not otherwise.   ?-ACS less likely as troponin mildly elevated but stable.  Mild troponin elevation likely due to demand ischemia. ?-Cardiac monitoring ?

## 2021-09-27 NOTE — Assessment & Plan Note (Addendum)
-  Hemoglobin A1c is 10.2 ?-CBG was elevated to 500 yesterday ?-Changed to resistant sliding scale, added NovoLog 5 units 3 times daily meal coverage ?-Started Lantus 20 units subcu daily ?-CBG is still elevated ?-Change Lantus to 30 units subcu daily ?-Patient is on prednisone 40 mg daily ?

## 2021-09-27 NOTE — Assessment & Plan Note (Addendum)
?  Prenal due to sepsis versus cardiorenal from decompensated CHF.  BUN 28, creatinine 1.5 (baseline 1.0-1.3).  ?-Creatinine today 1.84 ?-Patient received IV Lasix 20 mg x 1 yesterday ?-Follow renal function in a.m. ? ?

## 2021-09-27 NOTE — ED Triage Notes (Signed)
Complaining of being short of breath for the last 2-3 days, not being helped by proventil or pulmicort. Has been laying around in bed for the last 2 days. Feeling light headed ?

## 2021-09-27 NOTE — ED Provider Notes (Signed)
[facility] DEPT Provider Note   CSN: [id number] Arrival date & time: 09/27/21  0007     History  Chief Complaint  Patient presents with   Shortness of Breath    Cassandra Burns is a 56 y.o. female.  The history is provided by the patient and medical records.  Shortness of Breath Cassandra Burns is a 56 y.o. female who presents to the Emergency Department complaining of shortness of breath.  She presents the emergency department complaining of shortness of breath and cold type symptoms for the last 2 days.  She is taking sugar-free diabetic cough medication 3 times without improvement in her symptoms.  She reports temperature to 100 today.  She describes symptoms of shortness of breath, shakiness, lightheadedness, posttussive emesis.  Runny nose, cough productive of yellow sputum.  No sore throat.  She does have a little bit of chest pain when she breathes.  No lower extremity edema.  Her granddaughter was sick last week with URI symptoms.  She has a history of COPD, asthma, sleep apnea, hypertension, type 2 diabetes, CHF, pulmonary sarcoid.  She wears 2 L nasal cannula at baseline and 3 L nasal cannula with activity.  She has no history of blood clots.  She does take prednisone every other day.      Home Medications Prior to Admission medications   Medication Sig Start Date End Date Taking? Authorizing Provider  albuterol (PROVENTIL) (2.5 MG/3ML) 0.083% nebulizer solution Take 3 mLs (2.5 mg total) by nebulization every 6 (six) hours as needed for wheezing or shortness of breath. 05/19/17  Yes Magdalen Spatz, NP  albuterol (VENTOLIN HFA) 108 (90 Base) MCG/ACT inhaler Inhale 1-2 puffs into the lungs every 6 (six) hours as needed for wheezing or shortness of breath. 11/09/19  Yes Martyn Ehrich, NP  allopurinol (ZYLOPRIM) 100 MG tablet Take 200 mg by mouth daily.   Yes [provider]  amLODipine (NORVASC) 10 MG tablet Take 1 tablet  (10 mg total) by mouth daily. Patient taking differently: Take 10 mg by mouth daily after lunch. 07/13/14  Yes Ollis, Cleaster Corin, NP  budesonide-formoterol (SYMBICORT) 160-4.5 MCG/ACT inhaler Inhale 2 puffs into the lungs 2 (two) times daily. Patient taking differently: Inhale 2 puffs into the lungs daily as needed (for shortness of breath). 09/29/17  Yes Magdalen Spatz, NP  gabapentin (NEURONTIN) 800 MG tablet Take 800 mg by mouth 3 (three) times daily. 09/14/20  Yes [provider]  ibuprofen (ADVIL) 800 MG tablet Take 1 tablet (800 mg total) by mouth 3 (three) times daily. Patient taking differently: Take 800 mg by mouth daily as needed for headache (pain). 09/26/20  Yes White, Adrienne R, NP  labetalol (NORMODYNE) 100 MG tablet Take 100 mg by mouth daily. 10/27/19  Yes [provider]  losartan-hydrochlorothiazide (HYZAAR) 100-25 MG tablet Take 1 tablet by mouth daily after lunch.   Yes [provider]  OXYGEN Inhale 2-3 L into the lungs continuous.   Yes [provider]  predniSONE (DELTASONE) 5 MG tablet Take 5 mg by mouth every other day.   Yes [provider]  rosuvastatin (CRESTOR) 40 MG tablet Take 1 tablet (40 mg total) by mouth daily. 06/11/21 09/28/22 Yes Crenshaw, Denice Bors, MD  SYNJARDY XR 25-1000 MG TB24 Take 1 tablet by mouth daily. 09/25/21  Yes [provider]  TOUJEO SOLOSTAR 300 UNIT/ML Solostar Pen Inject 40 Units into the skin 2 (two) times daily after a meal. Patient taking  differently: Inject 50 Units into the skin 2 (two) times daily after a meal. 04/27/21  Yes Mariel Aloe, MD  Vitamin D, Ergocalciferol, (DRISDOL) 1.25 MG (50000 UNIT) CAPS capsule Take 50,000 Units by mouth every Monday.   Yes [provider]  Dulaglutide (TRULICITY) 3 OZ/3.6UY SOPN Inject 3 mg into the skin every Sunday. Patient not taking: Reported on 09/27/2021    [provider]  fluticasone (FLONASE) 50 MCG/ACT nasal spray Place 1 spray into  both nostrils daily as needed for allergies or rhinitis. Patient not taking: Reported on 09/27/2021 10/13/19   [provider]  furosemide (LASIX) 20 MG tablet Take 1 tablet (20 mg total) by mouth daily. Patient taking differently: Take 20 mg by mouth daily as needed for edema. 04/27/21 09/28/22  Mariel Aloe, MD      Allergies    Patient has no known allergies.    Review of Systems   Review of Systems  Respiratory:  Positive for shortness of breath.   All other systems reviewed and are negative.  Physical Exam Updated Vital Signs BP (!) 93/55    Pulse (!) 110    Temp 99.3 F (37.4 C) (Oral)    Resp (!) 21    Ht 4\' 11"  (1.499 m)    Wt 102.2 kg    LMP 06/13/2012    SpO2 95%    BMI 45.51 kg/m  Physical Exam Vitals and nursing note reviewed.  Constitutional:      Appearance: She is well-developed.  HENT:     Head: Normocephalic and atraumatic.  Cardiovascular:     Rate and Rhythm: Regular rhythm. Tachycardia present.     Heart sounds: No murmur heard. Pulmonary:     Comments: Tachypnea.  Decreased air movement bilaterally. Abdominal:     Palpations: Abdomen is soft.     Tenderness: There is no abdominal tenderness. There is no guarding or rebound.  Musculoskeletal:        General: No swelling or tenderness.  Skin:    General: Skin is warm and dry.  Neurological:     Mental Status: She is alert and oriented to person, place, and time.  Psychiatric:        Behavior: Behavior normal.    ED Results / Procedures / Treatments   Labs (all labs ordered are listed, but only abnormal results are displayed) Labs Reviewed  COMPREHENSIVE METABOLIC PANEL - Abnormal; Notable for the following components:      Result Value   Sodium 134 (*)    Glucose, Bld 167 (*)    BUN 28 (*)    Creatinine, Ser 1.51 (*)    Total Protein 8.5 (*)    GFR, Estimated 40 (*)    All other components within normal limits  CBC WITH DIFFERENTIAL/PLATELET - Abnormal; Notable for the following  components:   WBC 18.2 (*)    RBC 5.23 (*)    Hemoglobin 10.9 (*)    MCV 70.6 (*)    MCH 20.8 (*)    MCHC 29.5 (*)    RDW 19.9 (*)    Neutro Abs 15.6 (*)    Monocytes Absolute 1.2 (*)    Abs Immature Granulocytes 0.09 (*)    All other components within normal limits  BLOOD GAS, VENOUS - Abnormal; Notable for the following components:   pCO2, Ven 39 (*)    pO2, Ven 57 (*)    All other components within normal limits  TROPONIN I (HIGH SENSITIVITY) - Abnormal; Notable  for the following components:   Troponin I (High Sensitivity) 29 (*)    All other components within normal limits  TROPONIN I (HIGH SENSITIVITY) - Abnormal; Notable for the following components:   Troponin I (High Sensitivity) 31 (*)    All other components within normal limits  RESP PANEL BY RT-PCR (FLU A&B, COVID) ARPGX2  MRSA NEXT GEN BY PCR, NASAL  CULTURE, BLOOD (ROUTINE X 2)  CULTURE, BLOOD (ROUTINE X 2)  BRAIN NATRIURETIC PEPTIDE  STREP PNEUMONIAE URINARY ANTIGEN  LEGIONELLA PNEUMOPHILA SEROGP 1 UR AG  HEMOGLOBIN A1C  CBC  LACTIC ACID, PLASMA  BASIC METABOLIC PANEL  PROCALCITONIN    EKG None  Radiology DG Chest Port 1 View  Result Date: 09/27/2021 CLINICAL DATA:  Shortness of breath EXAM: PORTABLE CHEST 1 VIEW COMPARISON:  04/26/2021 FINDINGS: Mild cardiomegaly with bilateral basilar predominant opacities, left-greater-than-right. No pneumothorax or sizable pleural effusion. IMPRESSION: Mild cardiomegaly and bilateral basilar predominant opacities, which may indicate pulmonary edema or atelectasis. Electronically Signed   By: Ulyses Jarred M.D.   On: 09/27/2021 01:02    Procedures Procedures   CRITICAL CARE Performed by: Quintella Reichert   Total critical care time: 40 minutes  Critical care time was exclusive of separately billable procedures and treating other patients.  Critical care was necessary to treat or prevent imminent or life-threatening deterioration.  Critical care was time spent  personally by me on the following activities: development of treatment plan with patient and/or surrogate as well as nursing, discussions with consultants, evaluation of patient's response to treatment, examination of patient, obtaining history from patient or surrogate, ordering and performing treatments and interventions, ordering and review of laboratory studies, ordering and review of radiographic studies, pulse oximetry and re-evaluation of patient's condition.  Medications Ordered in ED Medications  Chlorhexidine Gluconate Cloth 2 % PADS 6 each (6 each Topical Given 09/27/21 0432)  MEDLINE mouth rinse (has no administration in time range)  azithromycin (ZITHROMAX) 500 mg in sodium chloride 0.9 % 250 mL IVPB (has no administration in time range)  cefTRIAXone (ROCEPHIN) 1 g in sodium chloride 0.9 % 100 mL IVPB (has no administration in time range)  insulin aspart (novoLOG) injection 0-9 Units (has no administration in time range)  insulin aspart (novoLOG) injection 0-5 Units (has no administration in time range)  furosemide (LASIX) injection 20 mg (has no administration in time range)  predniSONE (DELTASONE) tablet 40 mg (has no administration in time range)  allopurinol (ZYLOPRIM) tablet 200 mg (has no administration in time range)  rosuvastatin (CRESTOR) tablet 40 mg (has no administration in time range)  gabapentin (NEURONTIN) capsule 800 mg (has no administration in time range)  enoxaparin (LOVENOX) injection 40 mg (has no administration in time range)  acetaminophen (TYLENOL) tablet 650 mg (has no administration in time range)    Or  acetaminophen (TYLENOL) suppository 650 mg (has no administration in time range)  guaiFENesin (MUCINEX) 12 hr tablet 600 mg (has no administration in time range)  ipratropium-albuterol (DUONEB) 0.5-2.5 (3) MG/3ML nebulizer solution 3 mL (has no administration in time range)  methylPREDNISolone sodium succinate (SOLU-MEDROL) 125 mg/2 mL injection 125 mg (125  mg Intravenous Given 09/27/21 0100)  ipratropium (ATROVENT) nebulizer solution 0.5 mg (0.5 mg Nebulization Given 09/27/21 0102)  cefTRIAXone (ROCEPHIN) 1 g in sodium chloride 0.9 % 100 mL IVPB (0 g Intravenous Stopped 09/27/21 0235)  azithromycin (ZITHROMAX) 500 mg in sodium chloride 0.9 % 250 mL IVPB (0 mg Intravenous Stopped 09/27/21 0333)  ondansetron (ZOFRAN) injection 4 mg (  4 mg Intravenous Given 09/27/21 0309)    ED Course/ Medical Decision Making/ A&P                           Medical Decision Making Amount and/or Complexity of Data Reviewed Labs: ordered. Radiology: ordered.  Risk Prescription drug management. Decision regarding hospitalization.   Patient with history of COPD, sarcoid, OSA, CHF, chronic oxygen dependence here for evaluation of increased shortness of breath.  Patient ill-appearing on ED presentation with tachypnea, decreased air movement and tachycardia.  She was treated with albuterol, BiPAP with improvement in her symptoms.  Chest x-ray concerning for possible developing pneumonia, images personally reviewed.  CBC with leukocytosis.  She was started on antibiotics for possible community acquired pneumonia.  Plan to admit for ongoing treatment.  On reassessment on BiPAP patient appears significantly improved.  Medicine consulted for admission.  Patient updated findings of studies recommendation for admission and she is in agreement treatment plan.        Final Clinical Impression(s) / ED Diagnoses Final diagnoses:  None    Rx / DC Orders ED Discharge Orders     None         Quintella Reichert, MD 09/27/21 [phone]

## 2021-09-27 NOTE — Assessment & Plan Note (Addendum)
Possible CAP vs acute on chronic diastolic CHF ?Sepsis ?-Sepsis physiology has resolved ?-Patient started on IV antibiotics ?-Continue DuoNebs every 8 hours, p.o. prednisone ?-She also received Lasix 20 mg IV x1 ?-Currently she is requiring 3 L/min of oxygen via nasal cannula ? ?Addendum 09/27/2021 at 7:28 AM: Echo done September 2022 showing EF 65 to 83%, grade 1 diastolic dysfunction ? ?

## 2021-09-27 NOTE — Assessment & Plan Note (Signed)
Continue Crestor 

## 2021-09-27 NOTE — Assessment & Plan Note (Signed)
Continue allopurinol 

## 2021-09-27 NOTE — Progress Notes (Signed)
Inpatient Diabetes Program Recommendations ? ?AACE/ADA: New Consensus Statement on Inpatient Glycemic Control (2015) ? ?Target Ranges:  Prepandial:   less than 140 mg/dL ?     Peak postprandial:   less than 180 mg/dL (1-2 hours) ?     Critically ill patients:  140 - 180 mg/dL  ? ?Lab Results  ?Component Value Date  ? GLUCAP 383 (H) 09/27/2021  ? HGBA1C 10.2 (H) 09/27/2021  ? ? ?Review of Glycemic Control ? ?Diabetes history: DM2 ?Outpatient Diabetes medications: Toujeo 50 units BID, Synjardy 00/8676 mg QD, Trulicity 3 mg weekly (hasn't taken in 6 months) ?Current orders for Inpatient glycemic control: Novolog 0-9 units TID with meals and 0-5 HS ? ?HgbA1C - 10.2%. Received Solumedrol 125 mg injection this am. On Prednisone 40 mg QAM. ? ?Inpatient Diabetes Program Recommendations:   ? ?Add Semglee 25 units BID (1/2 home dose) ?Novolog 4 units TID with meals if eating > 50% (while on steroids) ? ?Spoke with pt at bedside regarding her diabetes control and HgbA1C of 10.2%. States it's up from 8%, likely d/t dietary indiscretions. Pt states her weakness is potato chips and salty foods. Discussed using plate method and eating meals at regular times, instead of letting herself get too hungry. Monitors blood sugars several times/day. Denies any hypoglycemia. States blood sugars usually on the high side States she is going to start taking care of herself. Knows poor glycemic control can lead to short and long-term complications. Sees PCP monthly. Appreciative of visit and information.  ? ?Continue to follow glucose trends. ? ?Thank you. ?Lorenda Peck, RD, LDN, CDE ?Inpatient Diabetes Coordinator ?[phone]  ? ? ? ? ? ? ? ? ? ?

## 2021-09-28 DIAGNOSIS — R778 Other specified abnormalities of plasma proteins: Secondary | ICD-10-CM

## 2021-09-28 DIAGNOSIS — J9621 Acute and chronic respiratory failure with hypoxia: Secondary | ICD-10-CM | POA: Diagnosis not present

## 2021-09-28 DIAGNOSIS — R0603 Acute respiratory distress: Secondary | ICD-10-CM

## 2021-09-28 DIAGNOSIS — N179 Acute kidney failure, unspecified: Secondary | ICD-10-CM | POA: Diagnosis not present

## 2021-09-28 DIAGNOSIS — E1165 Type 2 diabetes mellitus with hyperglycemia: Secondary | ICD-10-CM | POA: Diagnosis not present

## 2021-09-28 LAB — CBC
HCT: 37.9 % (ref 36.0–46.0)
Hemoglobin: 10.9 g/dL — ABNORMAL LOW (ref 12.0–15.0)
MCH: 20.6 pg — ABNORMAL LOW (ref 26.0–34.0)
MCHC: 28.8 g/dL — ABNORMAL LOW (ref 30.0–36.0)
MCV: 71.5 fL — ABNORMAL LOW (ref 80.0–100.0)
Platelets: 292 10*3/uL (ref 150–400)
RBC: 5.3 MIL/uL — ABNORMAL HIGH (ref 3.87–5.11)
RDW: 19.4 % — ABNORMAL HIGH (ref 11.5–15.5)
WBC: 19.9 10*3/uL — ABNORMAL HIGH (ref 4.0–10.5)
nRBC: 0 % (ref 0.0–0.2)

## 2021-09-28 LAB — BASIC METABOLIC PANEL
Anion gap: 9 (ref 5–15)
BUN: 51 mg/dL — ABNORMAL HIGH (ref 6–20)
CO2: 25 mmol/L (ref 22–32)
Calcium: 9.3 mg/dL (ref 8.9–10.3)
Chloride: 98 mmol/L (ref 98–111)
Creatinine, Ser: 1.84 mg/dL — ABNORMAL HIGH (ref 0.44–1.00)
GFR, Estimated: 32 mL/min — ABNORMAL LOW (ref >60)
Glucose, Bld: 332 mg/dL — ABNORMAL HIGH (ref 70–99)
Potassium: 5 mmol/L (ref 3.5–5.1)
Sodium: 132 mmol/L — ABNORMAL LOW (ref 135–145)

## 2021-09-28 LAB — GLUCOSE, CAPILLARY
Glucose-Capillary: 265 mg/dL — ABNORMAL HIGH (ref 70–99)
Glucose-Capillary: 340 mg/dL — ABNORMAL HIGH (ref 70–99)
Glucose-Capillary: 349 mg/dL — ABNORMAL HIGH (ref 70–99)
Glucose-Capillary: 314 mg/dL — ABNORMAL HIGH (ref 70–99)

## 2021-09-28 LAB — LEGIONELLA PNEUMOPHILA SEROGP 1 UR AG: L. pneumophila Serogp 1 Ur Ag: NEGATIVE

## 2021-09-28 NOTE — Progress Notes (Signed)
Transition of Care (TOC) Screening Note ? ?Patient Details  ?Name: Cassandra Burns ?Date of Birth: [birth year] ? ?Transition of Care (TOC) CM/SW Contact:    ?Sherie Don, LCSW ?Phone Number: ?09/28/2021, 11:11 AM ? ?Transition of Care Department [facility]) has reviewed patient and no TOC needs have been identified at this time. We will continue to monitor patient advancement through interdisciplinary progression rounds. If new patient transition needs arise, please place a TOC consult. ?

## 2021-09-28 NOTE — Hospital Course (Signed)
56 year old female with past medical history of sarcoidosis, asthma, OSA/OHS, chronic hypoxemic respiratory failure on 2 to 3 L/min of oxygen during the day and BiPAP at night, chronic diastolic CHF, class III obesity, hypertension, hyperlipidemia, diabetes mellitus type 2, depression, anxiety presented with shortness of breath, cough and fever.  Chest x-ray shows bilateral basilar opacities left more than right.  Patient started on Solu-Medrol, ceftriaxone and Zithromax ?

## 2021-09-28 NOTE — Progress Notes (Signed)
[facility] ? ?PROGRESS NOTE ? ?Cassandra Burns EHM:[id number] DOB: 1966/05/27 DOA: 09/27/2021 ?PCP: [name], MD ? ?Brief hospital course ? ?56 year old female with past medical history of sarcoidosis, asthma, OSA/OHS, chronic hypoxemic respiratory failure on 2 to 3 L/min of oxygen during the day and BiPAP at night, chronic diastolic CHF, class III obesity, hypertension, hyperlipidemia, diabetes mellitus type 2, depression, anxiety presented with shortness of breath, cough and fever.  Chest x-ray shows bilateral basilar opacities left more than right.  Patient started on Solu-Medrol, ceftriaxone and Zithromax  ? ? ? ?Subjective  ? ?Patient seen and examined, denies shortness of breath. ? ?  ? ?Assessment and Plan: ? ?* Acute on chronic respiratory failure with hypoxia (HCC) ?Possible CAP vs acute on chronic diastolic CHF ?Sepsis ?-Sepsis physiology has resolved ?-Patient started on IV antibiotics ?-Continue DuoNebs every 8 hours, p.o. prednisone ?-She also received Lasix 20 mg IV x1 ?-Currently she is requiring 4 L/min of oxygen via nasal cannula ? ?Addendum 09/27/2021 at 7:28 AM: Echo done September 2022 showing EF 65 to 36%, grade 1 diastolic dysfunction ? ? ?DM (diabetes mellitus) ([place]) ?-Hemoglobin A1c is 10.2 ?-CBG was elevated to 500 yesterday ?-Changed to resistant sliding scale, added NovoLog 5 units 3 times daily meal coverage ?-Started Lantus 20 units subcu daily ?-CBG is better controlled ?-Patient is on prednisone 40 mg daily ? ?AKI (acute kidney injury) ([place]) ??Prenal due to sepsis versus cardiorenal from decompensated CHF.  BUN 28, creatinine 1.5 (baseline 1.0-1.3).  ?-Creatinine today 1.84 ?-Patient received IV Lasix 20 mg x 1 yesterday ?-Follow renal function in a.m. ? ? ?HLD (hyperlipidemia) ?-Continue Crestor ? ?Diabetic neuropathy ([place]) ?-Continue gabapentin ? ?Gout ?-Continue allopurinol ? ?Elevated troponin ?-Patient was having  chest pain only when coughing but not otherwise.    ?-ACS less likely as troponin mildly elevated but stable.  Mild troponin elevation likely due to demand ischemia. ?-Cardiac monitoring ? ? ? ? ?  ? ?Medications ? ?  ? allopurinol  200 mg Oral Daily  ? Chlorhexidine Gluconate Cloth  6 each Topical Daily  ? enoxaparin (LOVENOX) injection  40 mg Subcutaneous Q24H  ? gabapentin  800 mg Oral TID  ? guaiFENesin  600 mg Oral BID  ? insulin aspart  0-20 Units Subcutaneous TID WC  ? insulin aspart  0-5 Units Subcutaneous QHS  ? insulin aspart  5 Units Subcutaneous TID WC  ? insulin glargine-yfgn  20 Units Subcutaneous Daily  ? ipratropium-albuterol  3 mL Nebulization TID  ? mouth rinse  15 mL Mouth Rinse BID  ? predniSONE  40 mg Oral Q breakfast  ? rosuvastatin  40 mg Oral Daily  ? ? ? Data Reviewed:  ? ?CBG: ? ?Recent Labs  ?Lab 09/27/21 ?0759 09/27/21 ?1114 09/27/21 ?1653 09/27/21 ?2108 09/28/21 ?6294  ?GLUCAP 263* 383* 500* 457* 265*  ? ? ?SpO2: 93 % ?O2 Flow Rate (L/min): 4 L/min (down to 3 lpm)  ? ? ?Vitals:  ? 09/28/21 0800 09/28/21 0820 09/28/21 0900 09/28/21 1000  ?BP: (!) 155/94  (!) 103/56 (!) 144/58  ?Pulse:   91 (!) 110  ?Resp: (!) 28  20 20   ?Temp:      ?TempSrc:      ?SpO2:  96% 94% 93%  ?Weight:      ?Height:      ? ? ? ? ?Data Reviewed: ? ?Basic Metabolic Panel: ?Recent Labs  ?Lab 09/27/21 ?7654 09/27/21 ?6503 09/28/21 ?0244  ?NA 134* 134* 132*  ?K 4.0 4.6 5.0  ?  CL 101 100 98  ?CO2 23 22 25   ?GLUCOSE 167* 259* 332*  ?BUN 28* 28* 51*  ?CREATININE 1.51* 1.58* 1.84*  ?CALCIUM 9.4 9.1 9.3  ? ? ?CBC: ?Recent Labs  ?Lab 09/27/21 ?5465 09/27/21 ?6812 09/28/21 ?0244  ?WBC 18.2* 20.9* 19.9*  ?NEUTROABS 15.6*  --   --   ?HGB 10.9* 10.5* 10.9*  ?HCT 36.9 37.7 37.9  ?MCV 70.6* 72.8* 71.5*  ?PLT 285 277 292  ? ? ?LFT ?Recent Labs  ?Lab 09/27/21 ?0050  ?AST 31  ?ALT 37  ?ALKPHOS 102  ?BILITOT 0.4  ?PROT 8.5*  ?ALBUMIN 4.1  ? ?  ?Micro : Blood cultures x2 showed no growth till date ? ? ? ?Antibiotics: ?Anti-infectives (From admission, onward)  ? ? Start     Dose/Rate  Route Frequency Ordered Stop  ? 09/27/21 2200  azithromycin (ZITHROMAX) 500 mg in sodium chloride 0.9 % 250 mL IVPB       ? 500 mg ?250 mL/hr over 60 Minutes Intravenous Every 24 hours 09/27/21 0709    ? 09/27/21 2200  cefTRIAXone (ROCEPHIN) 1 g in sodium chloride 0.9 % 100 mL IVPB       ? 1 g ?200 mL/hr over 30 Minutes Intravenous Every 24 hours 09/27/21 0709    ? 09/27/21 0130  cefTRIAXone (ROCEPHIN) 1 g in sodium chloride 0.9 % 100 mL IVPB       ? 1 g ?200 mL/hr over 30 Minutes Intravenous  Once 09/27/21 0121 09/27/21 0235  ? 09/27/21 0130  azithromycin (ZITHROMAX) 500 mg in sodium chloride 0.9 % 250 mL IVPB       ? 500 mg ?250 mL/hr over 60 Minutes Intravenous  Once 09/27/21 0121 09/27/21 0333  ? ?  ? ? ? ? ? ?DVT prophylaxis: Lovenox ? ?Code Status: Full code ? ?Family Communication: No family at bedside ? ?CONSULTS: ? ? ? ?Objective  ? ? ?Physical Examination: ? ? ?General-appears in no acute distress ?Heart-S1-S2, regular, no murmur auscultated ?Lungs-clear to auscultation bilaterally, no wheezing or crackles auscultated ?Abdomen-soft, nontender, no organomegaly ?Extremities-no edema in the lower extremities ?Neuro-alert, oriented x3, no focal deficit noted ? ?Status is: Inpatient: COPD exacerbation ? ? ? ?  ? ? ? ?Oswald Hillock ?  ?[facility] ?If 7PM-7AM, please contact night-coverage at [email or web address], ?Office  [phone] ? ? ?09/28/2021, 11:10 AM  LOS: 1 day  ? ? ? ? ? ? ? ? ? ? ?  ?

## 2021-09-29 DIAGNOSIS — M79604 Pain in right leg: Secondary | ICD-10-CM

## 2021-09-29 DIAGNOSIS — N179 Acute kidney failure, unspecified: Secondary | ICD-10-CM | POA: Diagnosis not present

## 2021-09-29 DIAGNOSIS — M79606 Pain in leg, unspecified: Secondary | ICD-10-CM

## 2021-09-29 DIAGNOSIS — R400 Somnolence: Secondary | ICD-10-CM

## 2021-09-29 DIAGNOSIS — E1149 Type 2 diabetes mellitus with other diabetic neurological complication: Secondary | ICD-10-CM

## 2021-09-29 DIAGNOSIS — J9621 Acute and chronic respiratory failure with hypoxia: Secondary | ICD-10-CM | POA: Diagnosis not present

## 2021-09-29 DIAGNOSIS — E1165 Type 2 diabetes mellitus with hyperglycemia: Secondary | ICD-10-CM | POA: Diagnosis not present

## 2021-09-29 LAB — BASIC METABOLIC PANEL
Anion gap: 9 (ref 5–15)
BUN: 53 mg/dL — ABNORMAL HIGH (ref 6–20)
CO2: 26 mmol/L (ref 22–32)
Calcium: 9.9 mg/dL (ref 8.9–10.3)
Chloride: 99 mmol/L (ref 98–111)
Creatinine, Ser: 1.51 mg/dL — ABNORMAL HIGH (ref 0.44–1.00)
GFR, Estimated: 40 mL/min — ABNORMAL LOW (ref >60)
Glucose, Bld: 327 mg/dL — ABNORMAL HIGH (ref 70–99)
Potassium: 5.5 mmol/L — ABNORMAL HIGH (ref 3.5–5.1)
Sodium: 134 mmol/L — ABNORMAL LOW (ref 135–145)

## 2021-09-29 LAB — BLOOD GAS, ARTERIAL
Acid-base deficit: 0.1 mmol/L (ref 0.0–2.0)
Bicarbonate: 27.1 mmol/L (ref 20.0–28.0)
Drawn by: 331471
FIO2: 32 %
O2 Saturation: 94.6 %
Patient temperature: 37
pCO2 arterial: 55 mmHg — ABNORMAL HIGH (ref 32–48)
pH, Arterial: 7.3 — ABNORMAL LOW (ref 7.35–7.45)
pO2, Arterial: 79 mmHg — ABNORMAL LOW (ref 83–108)

## 2021-09-29 LAB — GLUCOSE, CAPILLARY
Glucose-Capillary: 278 mg/dL — ABNORMAL HIGH (ref 70–99)
Glucose-Capillary: 248 mg/dL — ABNORMAL HIGH (ref 70–99)
Glucose-Capillary: 277 mg/dL — ABNORMAL HIGH (ref 70–99)
Glucose-Capillary: 364 mg/dL — ABNORMAL HIGH (ref 70–99)

## 2021-09-29 MED ORDER — BENZONATATE 100 MG PO CAPS
200.0000 mg | ORAL_CAPSULE | Freq: Three times a day (TID) | ORAL | Status: DC | PRN
  Administered 2021-09-29: 200 mg via ORAL
  Filled 2021-09-29: qty 2

## 2021-09-29 MED ORDER — AZITHROMYCIN 250 MG PO TABS
500.0000 mg | ORAL_TABLET | Freq: Every day | ORAL | Status: DC
  Administered 2021-09-29: 500 mg via ORAL
  Filled 2021-09-29: qty 2

## 2021-09-29 MED ORDER — ONDANSETRON HCL 4 MG/2ML IJ SOLN
4.0000 mg | Freq: Four times a day (QID) | INTRAMUSCULAR | Status: DC | PRN
  Administered 2021-09-29: 4 mg via INTRAVENOUS
  Filled 2021-09-29: qty 2

## 2021-09-29 MED ORDER — OXYCODONE HCL 5 MG PO TABS
5.0000 mg | ORAL_TABLET | ORAL | Status: DC | PRN
  Administered 2021-09-29: 5 mg via ORAL
  Filled 2021-09-29 (×2): qty 1

## 2021-09-29 NOTE — Assessment & Plan Note (Addendum)
Nausea/vomiting ?- Secondary to oxycodone ?-We will discontinue oxycodone ?-Obtain ABG, EKG ?-Start Zofran 4 mg IV every 6 hours as needed for nausea and vomiting. ? ?

## 2021-09-29 NOTE — Progress Notes (Signed)
Upon arriving to pt room, pt stated "something is not right, and "I feel high". Last PRN oxycodone ('5MG'$ ) dose given at 1045. Last PRN Tylenol ('650MG'$ ) dose given @ 1405. Pt was lethargic upon entering room. However, pt AOx4. Pt had one episode of emesis. Pt son and family member at bedside during this time. MD [place] notified via page.  ?

## 2021-09-29 NOTE — Progress Notes (Signed)
PHARMACIST - PHYSICIAN COMMUNICATION ? ?CONCERNING: Antibiotic IV to Oral Route Change Policy ? ?RECOMMENDATION: ?This patient is receiving azithromcyin by the intravenous route.  Based on criteria approved by the Pharmacy and Therapeutics Committee, the antibiotic(s) is/are being converted to the equivalent oral dose form(s). ? ? ?DESCRIPTION: ?These criteria include: ?Patient being treated for a respiratory tract infection, urinary tract infection, cellulitis or clostridium difficile associated diarrhea if on metronidazole ?The patient is not neutropenic and does not exhibit a GI malabsorption state ?The patient is eating (either orally or via tube) and/or has been taking other orally administered medications for a least 24 hours ?The patient is improving clinically and has a Tmax < 100.5 ? ?If you have questions about this conversion, please contact the Pharmacy Department  ? ?Tawnya Crook, PharmD, BCPS ?Clinical Pharmacist ?09/29/2021 12:27 PM ? ? ? ?

## 2021-09-29 NOTE — Progress Notes (Signed)
[facility] ? ?PROGRESS NOTE ? ?Cassandra Burns UJW:[id number] DOB: June 10, 1966 DOA: 09/27/2021 ?PCP: [name], MD ? ?Brief hospital course ? ?56 year old female with past medical history of sarcoidosis, asthma, OSA/OHS, chronic hypoxemic respiratory failure on 2 to 3 L/min of oxygen during the day and BiPAP at night, chronic diastolic CHF, class III obesity, hypertension, hyperlipidemia, diabetes mellitus type 2, depression, anxiety presented with shortness of breath, cough and fever.  Chest x-ray shows bilateral basilar opacities left more than right.  Patient started on Solu-Medrol, ceftriaxone and Zithromax  ? ? ? ?Subjective  ? ?Patient seen and examined, denies shortness of breath.  Complains of pain in her right leg.  Patient states that she has chronic back pain and neuropathy, usually takes gabapentin at home.  Gabapentin was started in the hospital however does not working.  ? ?  ? ?Assessment and Plan: ? ?* Acute on chronic respiratory failure with hypoxia (HCC) ?Possible CAP vs acute on chronic diastolic CHF ?Sepsis ?-Sepsis physiology has resolved ?-Patient started on IV antibiotics ?-Continue DuoNebs every 8 hours, p.o. prednisone ?-She also received Lasix 20 mg IV x1 ?-Currently she is requiring 3 L/min of oxygen via nasal cannula ? ?Addendum 09/27/2021 at 7:28 AM: Echo done September 2022 showing EF 65 to 56%, grade 1 diastolic dysfunction ? ? ?DM (diabetes mellitus) ([place]) ?-Hemoglobin A1c is 10.2 ?-CBG was elevated to 500 yesterday ?-Changed to resistant sliding scale, added NovoLog 5 units 3 times daily meal coverage ?-Started Lantus 20 units subcu daily ?-CBG is still elevated ?-Change Lantus to 30 units subcu daily ?-Patient is on prednisone 40 mg daily ? ?AKI (acute kidney injury) ([place]) ??Prenal due to sepsis versus cardiorenal from decompensated CHF.  BUN 28, creatinine 1.5 (baseline 1.0-1.3).  ?-Creatinine today 1.84 ?-Patient received IV Lasix 20 mg x 1 yesterday ?-Follow renal  function in a.m. ? ? ?Somnolence ?Nausea/vomiting ?- Secondary to oxycodone ?-We will discontinue oxycodone ?-Obtain ABG, EKG ?-Start Zofran 4 mg IV every 6 hours as needed for nausea and vomiting. ? ? ?Leg pain ?Chronic pain syndrome ?- Patient says that she has neuropathy in takes gabapentin at home ?-Pain not controlled with gabapentin ?-She was started on oxycodone 5 mg every 6 hours as needed, however patient stated that she felt "high" with oxycodone ?-We will discontinue oxycodone ? ? ?HLD (hyperlipidemia) ?-Continue Crestor ? ?Diabetic neuropathy ([place]) ?-Continue gabapentin ? ?Gout ?-Continue allopurinol ? ?Elevated troponin ?-Patient was having  chest pain only when coughing but not otherwise.   ?-ACS less likely as troponin mildly elevated but stable.  Mild troponin elevation likely due to demand ischemia. ?-Cardiac monitoring ? ? ? ? ?  ? ?Medications ? ?  ? allopurinol  200 mg Oral Daily  ? azithromycin  500 mg Oral QHS  ? enoxaparin (LOVENOX) injection  40 mg Subcutaneous Q24H  ? gabapentin  800 mg Oral TID  ? guaiFENesin  600 mg Oral BID  ? insulin aspart  0-20 Units Subcutaneous TID WC  ? insulin aspart  0-5 Units Subcutaneous QHS  ? insulin aspart  5 Units Subcutaneous TID WC  ? insulin glargine-yfgn  20 Units Subcutaneous Daily  ? ipratropium-albuterol  3 mL Nebulization TID  ? mouth rinse  15 mL Mouth Rinse BID  ? predniSONE  40 mg Oral Q breakfast  ? rosuvastatin  40 mg Oral Daily  ? ? ? Data Reviewed:  ? ?CBG: ? ?Recent Labs  ?Lab 09/28/21 ?1149 09/28/21 ?1619 09/28/21 ?2155 09/29/21 ?2130 09/29/21 ?1134  ?GLUCAP  340* 349* 314* 278* 248*  ? ? ?SpO2: 96 % ?O2 Flow Rate (L/min): 3 L/min  ? ? ?Vitals:  ? 09/29/21 0548 09/29/21 0807 09/29/21 1334 09/29/21 1459  ?BP: (!) 143/90  (!) 157/66 (!) 160/91  ?Pulse: 89  84 96  ?Resp: '18  16 20  '$ ?Temp: (!) 97.4 ?F (36.3 ?C)  97.8 ?F (36.6 ?C) 98.1 ?F (36.7 ?C)  ?TempSrc: Oral  Oral Oral  ?SpO2: 99% 96% 94% 96%  ?Weight:      ?Height:      ? ? ? ? ?Data  Reviewed: ? ?Basic Metabolic Panel: ?Recent Labs  ?Lab 09/27/21 ?9509 09/27/21 ?3267 09/28/21 ?0244  ?NA 134* 134* 132*  ?K 4.0 4.6 5.0  ?CL 101 100 98  ?CO2 '23 22 25  '$ ?GLUCOSE 167* 259* 332*  ?BUN 28* 28* 51*  ?CREATININE 1.51* 1.58* 1.84*  ?CALCIUM 9.4 9.1 9.3  ? ? ?CBC: ?Recent Labs  ?Lab 09/27/21 ?1245 09/27/21 ?8099 09/28/21 ?0244  ?WBC 18.2* 20.9* 19.9*  ?NEUTROABS 15.6*  --   --   ?HGB 10.9* 10.5* 10.9*  ?HCT 36.9 37.7 37.9  ?MCV 70.6* 72.8* 71.5*  ?PLT 285 277 292  ? ? ?LFT ?Recent Labs  ?Lab 09/27/21 ?0050  ?AST 31  ?ALT 37  ?ALKPHOS 102  ?BILITOT 0.4  ?PROT 8.5*  ?ALBUMIN 4.1  ? ?  ?Micro : Blood cultures x2 showed no growth till date ? ? ? ?Antibiotics: ?Anti-infectives (From admission, onward)  ? ? Start     Dose/Rate Route Frequency Ordered Stop  ? 09/29/21 2200  azithromycin (ZITHROMAX) tablet 500 mg       ? 500 mg Oral Daily at bedtime 09/29/21 1226    ? 09/27/21 2200  azithromycin (ZITHROMAX) 500 mg in sodium chloride 0.9 % 250 mL IVPB  Status:  Discontinued       ? 500 mg ?250 mL/hr over 60 Minutes Intravenous Every 24 hours 09/27/21 0709 09/29/21 1226  ? 09/27/21 2200  cefTRIAXone (ROCEPHIN) 1 g in sodium chloride 0.9 % 100 mL IVPB       ? 1 g ?200 mL/hr over 30 Minutes Intravenous Every 24 hours 09/27/21 0709    ? 09/27/21 0130  cefTRIAXone (ROCEPHIN) 1 g in sodium chloride 0.9 % 100 mL IVPB       ? 1 g ?200 mL/hr over 30 Minutes Intravenous  Once 09/27/21 0121 09/27/21 0235  ? 09/27/21 0130  azithromycin (ZITHROMAX) 500 mg in sodium chloride 0.9 % 250 mL IVPB       ? 500 mg ?250 mL/hr over 60 Minutes Intravenous  Once 09/27/21 0121 09/27/21 0333  ? ?  ? ? ? ? ? ?DVT prophylaxis: Lovenox ? ?Code Status: Full code ? ?Family Communication: No family at bedside ? ?CONSULTS: ? ? ? ?Objective  ? ? ?Physical Examination: ? ? ?General-appears in no acute distress ?Heart-S1-S2, regular, no murmur auscultated ?Lungs-clear to auscultation bilaterally, no wheezing or crackles auscultated ?Abdomen-soft,  nontender, no organomegaly ?Extremities-no edema in the lower extremities ?Neuro-alert, oriented x3, no focal deficit noted d ? ?Status is: Inpatient: COPD exacerbation ? ? ? ?  ? ? ? ?Cassandra Burns ?  ?[facility] ?If 7PM-7AM, please contact night-coverage at [email or web address], ?Office  [phone] ? ? ?09/29/2021, 4:12 PM  LOS: 2 days  ? ? ? ? ? ? ? ? ? ? ?  ?

## 2021-09-29 NOTE — Assessment & Plan Note (Signed)
Chronic pain syndrome ?- Patient says that she has neuropathy in takes gabapentin at home ?-Pain not controlled with gabapentin ?-She was started on oxycodone 5 mg every 6 hours as needed, however patient stated that she felt "high" with oxycodone ?-We will discontinue oxycodone ? ?

## 2021-09-30 DIAGNOSIS — J9621 Acute and chronic respiratory failure with hypoxia: Secondary | ICD-10-CM | POA: Diagnosis not present

## 2021-09-30 DIAGNOSIS — E1149 Type 2 diabetes mellitus with other diabetic neurological complication: Secondary | ICD-10-CM | POA: Diagnosis not present

## 2021-09-30 DIAGNOSIS — N179 Acute kidney failure, unspecified: Secondary | ICD-10-CM | POA: Diagnosis not present

## 2021-09-30 DIAGNOSIS — E1165 Type 2 diabetes mellitus with hyperglycemia: Secondary | ICD-10-CM | POA: Diagnosis not present

## 2021-09-30 LAB — GLUCOSE, CAPILLARY
Glucose-Capillary: 180 mg/dL — ABNORMAL HIGH (ref 70–99)
Glucose-Capillary: 188 mg/dL — ABNORMAL HIGH (ref 70–99)
Glucose-Capillary: 256 mg/dL — ABNORMAL HIGH (ref 70–99)

## 2021-09-30 LAB — POTASSIUM: Potassium: 5.1 mmol/L (ref 3.5–5.1)

## 2021-09-30 MED ORDER — SODIUM ZIRCONIUM CYCLOSILICATE 5 G PO PACK
5.0000 g | PACK | Freq: Once | ORAL | Status: AC
  Administered 2021-09-30: 5 g via ORAL
  Filled 2021-09-30: qty 1

## 2021-09-30 MED ORDER — IPRATROPIUM-ALBUTEROL 0.5-2.5 (3) MG/3ML IN SOLN: 3.0000 mL | Freq: Two times a day (BID) | RESPIRATORY_TRACT | Status: DC

## 2021-09-30 MED ORDER — AMLODIPINE BESYLATE 10 MG PO TABS: 10.0000 mg | ORAL_TABLET | Freq: Every day | ORAL | 3 refills | Status: AC

## 2021-09-30 MED ORDER — MUSCLE RUB 10-15 % EX CREA
TOPICAL_CREAM | CUTANEOUS | Status: DC | PRN
  Filled 2021-09-30: qty 85

## 2021-09-30 MED ORDER — AMLODIPINE BESYLATE 10 MG PO TABS
10.0000 mg | ORAL_TABLET | Freq: Every day | ORAL | Status: DC
  Administered 2021-09-30: 10 mg via ORAL
  Filled 2021-09-30: qty 1

## 2021-09-30 NOTE — Discharge Summary (Addendum)
Physician Discharge Summary   Patient: Cassandra Burns MRN: [id number] DOB: Jun 01, 1966  Admit date:     09/27/2021  Discharge date: 09/30/21  Discharge Physician: Oswald Hillock   PCP: [name], MD   Recommendations at discharge:   Follow-up PCP in 1 week  Discharge Diagnoses: Principal Problem:   Acute on chronic respiratory failure with hypoxia (HCC) Active Problems:   DM (diabetes mellitus) (HCC)   AKI (acute kidney injury) ([place])   Elevated troponin   Gout   Diabetic neuropathy (HCC)   HLD (hyperlipidemia)   Leg pain   Somnolence  Resolved Problems:   * No resolved hospital problems. *   Hospital Course:  56 year old female with past medical history of sarcoidosis, asthma, OSA/OHS, chronic hypoxemic respiratory failure on 2 to 3 L/min of oxygen during the day and BiPAP at night, chronic diastolic CHF, class III obesity, hypertension, hyperlipidemia, diabetes mellitus type 2, depression, anxiety presented with shortness of breath, cough and fever.  Chest x-ray shows bilateral basilar opacities left more than right.  Patient started on Solu-Medrol, ceftriaxone and Zithromax  Assessment and Plan: * Acute on chronic respiratory failure with hypoxia (HCC) Possible CAP vs acute on chronic diastolic CHF Sepsis -Sepsis physiology has resolved -Patient started on IV antibiotics -Patient was started on DuoNebs every 8 hours, p.o. prednisone -She also received Lasix 20 mg IV x1 -Currently she is requiring 3 L/min of oxygen via nasal cannula  Addendum 09/27/2021 at 7:28 AM: Echo done September 2022 showing EF 65 to 56%, grade 1 diastolic dysfunction   DM (diabetes mellitus) (HCC) -Hemoglobin A1c is 10.2 -CBG was elevated to 500  -Changed to resistant sliding scale, added NovoLog 5 units 3 times daily meal coverage -Started Lantus 20 units subcu daily -CBG is still elevated -Change Lantus to 30 units subcu daily -Patient is on prednisone 40 mg daily -Prednisone has  been changed to 5 mg daily which patient takes at home -Continue home regimen  AKI (acute kidney injury) ([place]) ?Prenal due to sepsis versus cardiorenal from decompensated CHF.  BUN 28, creatinine 1.5 (baseline 1.0-1.3).  -Creatinine today 1.51 -At baseline  Hyperkalemia -Potassium was 5.5 yesterday -Given Lokelma 5 g p.o. x1 -Repeat potassium today is 5.1   Somnolence Nausea/vomiting -Resolved, patient back to baseline - Secondary to oxycodone -Oxycodone was discontinued   Leg pain Chronic pain syndrome - Patient says that she has neuropathy in takes gabapentin at home -Pain not controlled with gabapentin -She was started on oxycodone 5 mg every 6 hours as needed, however patient stated that she felt "high" with oxycodone -Oxycodone discontinued -Continue home regimen with gabapentin   HLD (hyperlipidemia) -Continue Crestor  Diabetic neuropathy (HCC) -Continue gabapentin  Gout -Continue allopurinol  Elevated troponin -Patient was having  chest pain only when coughing but not otherwise.   -ACS less likely as troponin mildly elevated but stable.  Mild troponin elevation likely due to demand ischemia. -Cardiac monitoring  Hypertension -Blood pressure elevated 161/99 -Restart amlodipine 10 mg daily -Patient can start taking HCTZ/lisinopril when she gets home         Consultants:  Procedures performed:   Disposition: Home Diet recommendation:  Discharge Diet Orders (From admission, onward)     Start     Ordered   09/30/21 0000  Diet - low sodium heart healthy        09/30/21 1617           Carb modified diet  DISCHARGE MEDICATION: Allergies as of 09/30/2021  No Known Allergies      Medication List     STOP taking these medications    ibuprofen 800 MG tablet Commonly known as: ADVIL       TAKE these medications    albuterol (2.5 MG/3ML) 0.083% nebulizer solution Commonly known as: PROVENTIL Take 3 mLs (2.5 mg total) by nebulization  every 6 (six) hours as needed for wheezing or shortness of breath.   albuterol 108 (90 Base) MCG/ACT inhaler Commonly known as: VENTOLIN HFA Inhale 1-2 puffs into the lungs every 6 (six) hours as needed for wheezing or shortness of breath.   allopurinol 100 MG tablet Commonly known as: ZYLOPRIM Take 200 mg by mouth daily.   amLODipine 10 MG tablet Commonly known as: NORVASC Take 1 tablet (10 mg total) by mouth daily after lunch.   budesonide-formoterol 160-4.5 MCG/ACT inhaler Commonly known as: SYMBICORT Inhale 2 puffs into the lungs 2 (two) times daily. What changed:  when to take this reasons to take this   fluticasone 50 MCG/ACT nasal spray Commonly known as: FLONASE Place 1 spray into both nostrils daily as needed for allergies or rhinitis.   furosemide 20 MG tablet Commonly known as: Lasix Take 1 tablet (20 mg total) by mouth daily. What changed:  when to take this reasons to take this   gabapentin 800 MG tablet Commonly known as: NEURONTIN Take 800 mg by mouth 3 (three) times daily.   labetalol 100 MG tablet Commonly known as: NORMODYNE Take 100 mg by mouth daily.   losartan-hydrochlorothiazide 100-25 MG tablet Commonly known as: HYZAAR Take 1 tablet by mouth daily after lunch.   OXYGEN Inhale 2-3 L into the lungs continuous.   predniSONE 5 MG tablet Commonly known as: DELTASONE Take 5 mg by mouth every other day.   rosuvastatin 40 MG tablet Commonly known as: CRESTOR Take 1 tablet (40 mg total) by mouth daily.   Synjardy XR 25-1000 MG Tb24 Generic drug: Empagliflozin-metFORMIN HCl ER Take 1 tablet by mouth daily.   Toujeo SoloStar 300 UNIT/ML Solostar Pen Generic drug: insulin glargine (1 Unit Dial) Inject 40 Units into the skin 2 (two) times daily after a meal. What changed: how much to take   Trulicity 3 ZJ/6.7HA Sopn Generic drug: Dulaglutide Inject 3 mg into the skin every Sunday.   Vitamin D (Ergocalciferol) 1.25 MG (50000 UNIT) Caps  capsule Commonly known as: DRISDOL Take 50,000 Units by mouth every Monday.        Follow-up Information     [name], MD Follow up in 1 week(s).   Specialty: Internal Medicine Contact information: [place] [address] [phone]                 Discharge Exam: Danley Danker Weights   09/27/21 0020 09/27/21 0435  Weight: (!) 152 kg 102.2 kg   General-appears in no acute distress Heart-S1-S2, regular, no murmur auscultated Lungs-clear to auscultation bilaterally, no wheezing or crackles auscultated Abdomen-soft, nontender, no organomegaly Extremities-no edema in the lower extremities Neuro-alert, oriented x3, no focal deficit noted  Condition at discharge: good  The results of significant diagnostics from this hospitalization (including imaging, microbiology, ancillary and laboratory) are listed below for reference.   Imaging Studies: DG Chest Port 1 View  Result Date: 09/27/2021 CLINICAL DATA:  Shortness of breath EXAM: PORTABLE CHEST 1 VIEW COMPARISON:  04/26/2021 FINDINGS: Mild cardiomegaly with bilateral basilar predominant opacities, left-greater-than-right. No pneumothorax or sizable pleural effusion. IMPRESSION: Mild cardiomegaly and bilateral basilar predominant opacities, which may  indicate pulmonary edema or atelectasis. Electronically Signed   By: Ulyses Jarred M.D.   On: 09/27/2021 01:02    Microbiology: Results for orders placed or performed during the hospital encounter of 09/27/21  Resp Panel by RT-PCR (Flu A&B, Covid) Nasopharyngeal Swab     Status: None   Collection Time: 09/27/21 12:50 AM   Specimen: Nasopharyngeal Swab; Nasopharyngeal(NP) swabs in vial transport medium  Result Value Ref Range Status   SARS Coronavirus 2 by RT PCR NEGATIVE NEGATIVE Final    Comment: (NOTE) SARS-CoV-2 target nucleic acids are NOT DETECTED.  The SARS-CoV-2 RNA is generally detectable in upper respiratory specimens during the acute phase of  infection. The lowest concentration of SARS-CoV-2 viral copies this assay can detect is 138 copies/mL. A negative result does not preclude SARS-Cov-2 infection and should not be used as the sole basis for treatment or other patient management decisions. A negative result may occur with  improper specimen collection/handling, submission of specimen other than nasopharyngeal swab, presence of viral mutation(s) within the areas targeted by this assay, and inadequate number of viral copies(<138 copies/mL). A negative result must be combined with clinical observations, patient history, and epidemiological information. The expected result is Negative.  Fact Sheet for Patients:  EntrepreneurPulse.com.au  Fact Sheet for Healthcare Providers:  IncredibleEmployment.be  This test is no t yet approved or cleared by the [place] FDA and  has been authorized for detection and/or diagnosis of SARS-CoV-2 by FDA under an Emergency Use Authorization (EUA). This EUA will remain  in effect (meaning this test can be used) for the duration of the COVID-19 declaration under Section 564(b)(1) of the Act, 21 U.S.C.section 360bbb-3(b)(1), unless the authorization is terminated  or revoked sooner.       Influenza A by PCR NEGATIVE NEGATIVE Final   Influenza B by PCR NEGATIVE NEGATIVE Final    Comment: (NOTE) The Xpert Xpress SARS-CoV-2/FLU/RSV plus assay is intended as an aid in the diagnosis of influenza from Nasopharyngeal swab specimens and should not be used as a sole basis for treatment. Nasal washings and aspirates are unacceptable for Xpert Xpress SARS-CoV-2/FLU/RSV testing.  Fact Sheet for Patients: EntrepreneurPulse.com.au  Fact Sheet for Healthcare Providers: IncredibleEmployment.be  This test is not yet approved or cleared by the [place] FDA and has been authorized for detection and/or diagnosis of SARS-CoV-2  by FDA under an Emergency Use Authorization (EUA). This EUA will remain in effect (meaning this test can be used) for the duration of the COVID-19 declaration under Section 564(b)(1) of the Act, 21 U.S.C. section 360bbb-3(b)(1), unless the authorization is terminated or revoked.  Performed at [facility], [place] [address] [place], [place] [address]   MRSA Next Gen by PCR, Nasal     Status: None   Collection Time: 09/27/21  4:32 AM   Specimen: Nasal Mucosa; Nasal Swab  Result Value Ref Range Status   MRSA by PCR Next Gen NOT DETECTED NOT DETECTED Final    Comment: (NOTE) The GeneXpert MRSA Assay (FDA approved for NASAL specimens only), is one component of a comprehensive MRSA colonization surveillance program. It is not intended to diagnose MRSA infection nor to guide or monitor treatment for MRSA infections. Test performance is not FDA approved in patients less than 77 years old. Performed at [facility], [place] [address], [place], [place] [address]   Culture, blood (routine x 2)     Status: None (Preliminary result)   Collection Time: 09/27/21  7:30 AM  Specimen: BLOOD  Result Value Ref Range Status   Specimen Description   Final    BLOOD BLOOD LEFT HAND Performed at [facility] [address], [place], [place] [address]    Special Requests   Final    BOTTLES DRAWN AEROBIC ONLY Blood Culture adequate volume Performed at [facility] [address] [place], [place] [address]    Culture   Final    NO GROWTH 3 DAYS Performed at [facility], [place] [address] [place], [place] [address]    Report Status PENDING  Incomplete  Culture, blood (routine x 2)     Status: None (Preliminary result)   Collection Time: 09/27/21  7:32 AM   Specimen: BLOOD  Result Value Ref Range Status   Specimen Description   Final    BLOOD BLOOD RIGHT HAND Performed at [facility] [address], [place], [place] [address]    Special Requests   Final    BOTTLES DRAWN AEROBIC ONLY Blood Culture adequate volume Performed at [place] [address] [place], [place] [address]    Culture   Final    NO GROWTH 3 DAYS Performed at [facility], [place] [address], [place], [place] [address]    Report Status PENDING  Incomplete    Labs: CBC: Recent Labs  Lab 09/27/21 0050 09/27/21 0723 09/28/21 0244  WBC 18.2* 20.9* 19.9*  NEUTROABS 15.6*  --   --   HGB 10.9* 10.5* 10.9*  HCT 36.9 37.7 37.9  MCV 70.6* 72.8* 71.5*  PLT 285 277 239   Basic Metabolic Panel: Recent Labs  Lab 09/27/21 0050 09/27/21 0723 09/28/21 0244 09/29/21 1656 09/30/21 1539  NA 134* 134* 132* 134*  --   K 4.0 4.6 5.0 5.5* 5.1  CL 101 100 98 99  --   CO2 '23 22 25 26  '$ --   GLUCOSE 167* 259* 332* 327*  --   BUN 28* 28* 51* 53*  --   CREATININE 1.51* 1.58* 1.84* 1.51*  --   CALCIUM 9.4 9.1 9.3 9.9  --    Liver Function Tests: Recent Labs  Lab 09/27/21 0050  AST 31  ALT 37  ALKPHOS 102  BILITOT 0.4  PROT 8.5*  ALBUMIN 4.1   CBG: Recent Labs  Lab 09/29/21 1625 09/29/21 2120 09/30/21 0846 09/30/21 1143 09/30/21 1615  GLUCAP 277* 364* 180* 188* 256*    Discharge time spent: greater than 30 minutes.  Signed: Oswald Hillock, MD [facility] 09/30/2021

## 2021-10-02 LAB — CULTURE, BLOOD (ROUTINE X 2)
Culture: NO GROWTH
Culture: NO GROWTH
Special Requests: ADEQUATE
Special Requests: ADEQUATE

## 2021-10-08 ENCOUNTER — Ambulatory Visit: Payer: Medicare HMO | Admitting: Dietician

## 2021-10-09 ENCOUNTER — Telehealth: Payer: Self-pay | Admitting: Pulmonary Disease

## 2021-10-09 DIAGNOSIS — D860 Sarcoidosis of lung: Secondary | ICD-10-CM

## 2021-10-09 DIAGNOSIS — J849 Interstitial pulmonary disease, unspecified: Secondary | ICD-10-CM

## 2021-10-09 NOTE — Telephone Encounter (Signed)
Lm x1 for patient.  

## 2021-10-10 NOTE — Telephone Encounter (Signed)
Called patient and she states that she needs and order for her to rent a POC. She states that she is going out of town in May of 2023 and will be on a place and needs a POC.  ? ?She is wondering if she can get an order stating that she can get a POC for the plane ride.  ? ?Dr Halford Chessman please advise  ?

## 2021-10-10 NOTE — Telephone Encounter (Signed)
Okay to send order for her to rent a POC for plane ride.  She is using 2 liters 24/7.  Please inform her that she should check with her airline to make sure she has all forms completed well in advance of her trip so she can travel with supplemental oxygen. ? ?

## 2021-10-11 NOTE — Telephone Encounter (Signed)
Spoke to patient and relayed below message.  ?She stated that the airlines will be sending her a list of DME companies to rent POC from. She will call back with name of DME.  ? ?

## 2021-10-19 ENCOUNTER — Telehealth (HOSPITAL_COMMUNITY): Payer: Self-pay

## 2021-10-19 NOTE — Telephone Encounter (Signed)
**  CORRECTION APPOINTMENT**    ?Called patient to see if she was interested in participating in the Pulmonary Rehab Program. Patient stated yes. Patient will come in for orientation on 10/28/17/2023'@11'$ :00am and will attend the 1:15 exercise class. ?  ?Tourist information centre manager. ? ? ? ?

## 2021-10-19 NOTE — Telephone Encounter (Deleted)
Called patient to see if she was interested in participating in the Pulmonary Rehab Program. Patient stated yes. Patient will come in for orientation on 11/07/2021'@11'$ :00am and will attend the 1:15pm exercise class. ?  ?Tourist information centre manager. ?

## 2021-10-23 ENCOUNTER — Telehealth: Payer: Self-pay | Admitting: Acute Care

## 2021-10-23 NOTE — Telephone Encounter (Signed)
Spoke to Benedict with [employer] and relayed below message.  ?He is okay with waiting until Thursday or Friday for Rx. ? ?Routing to Judson Roch as an [place] ?

## 2021-10-25 NOTE — Telephone Encounter (Signed)
Will forward to [place] to advise when this has been faxed. Thanks.  ?

## 2021-10-25 NOTE — Telephone Encounter (Signed)
I called and spoke with [facility] and he reports that he did get the fax. Nothing further needed.  ?

## 2021-10-29 NOTE — Telephone Encounter (Signed)
Spoke with pt who would like to have a POC for daily travel. Order for POC was sent to [facility] as well request for best fit evaluation. Pt made aware and stated understanding. Nothing further needed at this time.  ?

## 2021-10-31 ENCOUNTER — Telehealth: Payer: Self-pay | Admitting: Acute Care

## 2021-10-31 NOTE — Telephone Encounter (Signed)
I received this after I sent the referral that states Please evaluate and titrate for best fit POC or portable O2 system. I called and left Danielle a message to call me back. ? ?General 10/29/2021  5:02 PM Liliane Bade D - -  ?Note:   ?Confirmation message received from [facility] ?

## 2021-11-02 ENCOUNTER — Other Ambulatory Visit: Payer: Self-pay | Admitting: Internal Medicine

## 2021-11-03 LAB — BASIC METABOLIC PANEL WITH GFR
BUN/Creatinine Ratio: 12 (calc) (ref 6–22)
BUN: 22 mg/dL (ref 7–25)
CO2: 25 mmol/L (ref 20–32)
Calcium: 10 mg/dL (ref 8.6–10.4)
Chloride: 101 mmol/L (ref 98–110)
Creat: 1.86 mg/dL — ABNORMAL HIGH (ref 0.50–1.03)
Glucose, Bld: 112 mg/dL — ABNORMAL HIGH (ref 65–99)
Potassium: 4.8 mmol/L (ref 3.5–5.3)
Sodium: 139 mmol/L (ref 135–146)
eGFR: 31 mL/min/{1.73_m2} — ABNORMAL LOW (ref >=60)

## 2021-11-03 LAB — CBC
HCT: 36 % (ref 35.0–45.0)
Hemoglobin: 10.5 g/dL — ABNORMAL LOW (ref 11.7–15.5)
MCH: 20.5 pg — ABNORMAL LOW (ref 27.0–33.0)
MCHC: 29.2 g/dL — ABNORMAL LOW (ref 32.0–36.0)
MCV: 70.2 fL — ABNORMAL LOW (ref 80.0–100.0)
MPV: 10.6 fL (ref 7.5–12.5)
Platelets: 295 10*3/uL (ref 140–400)
RBC: 5.13 10*6/uL — ABNORMAL HIGH (ref 3.80–5.10)
RDW: 18.9 % — ABNORMAL HIGH (ref 11.0–15.0)
WBC: 9.4 10*3/uL (ref 3.8–10.8)

## 2021-11-03 LAB — SPECIMEN COMPROMISED

## 2021-11-07 ENCOUNTER — Ambulatory Visit (HOSPITAL_COMMUNITY): Payer: Medicare HMO

## 2021-11-13 ENCOUNTER — Encounter (HOSPITAL_COMMUNITY): Payer: Medicare HMO

## 2021-11-13 ENCOUNTER — Telehealth: Payer: Self-pay | Admitting: Pulmonary Disease

## 2021-11-13 ENCOUNTER — Telehealth (HOSPITAL_COMMUNITY): Payer: Self-pay | Admitting: *Deleted

## 2021-11-13 NOTE — Telephone Encounter (Signed)
Called patient but she did not answer. Left message for her to call us back.  

## 2021-11-13 NOTE — Telephone Encounter (Signed)
Spoke with Coralyn Mark at [facility]  ?She states that they can not dispense o2 to the pt until she pays her balance with them  ?She is currently with [facility] and had wanted to switch to [facility]  ?They have been trying to reach the pt and said her line has been busy  ?I called the pt and had to [facility]  ?

## 2021-11-14 ENCOUNTER — Encounter (HOSPITAL_COMMUNITY)
Admission: RE | Admit: 2021-11-14 | Discharge: 2021-11-14 | Disposition: A | Discharge status: Home / Self Care | Payer: Medicare HMO | Source: Ambulatory Visit | Attending: Pulmonary Disease | Admitting: Pulmonary Disease

## 2021-11-14 VITALS — BP 110/70 | Ht 59.0 in | Wt 228.2 lb

## 2021-11-14 DIAGNOSIS — D860 Sarcoidosis of lung: Secondary | ICD-10-CM | POA: Insufficient documentation

## 2021-11-14 NOTE — Progress Notes (Signed)
Cassandra Burns 56 y.o. female ?Pulmonary Rehab Orientation Note ?This patient who was referred to Pulmonary Rehab by Dr. Halford Chessman with the diagnosis of Sarcoidosis arrived today in Cardiac and Pulmonary Rehab. She arrived with ambulatory normal gait. She  does carry portable oxygen. [employer] is the provider for their DME. Per pt, Cassandra Burns uses oxygen continuously. Color good, skin warm and dry. Patient is oriented to time and place. Patient's medical history, psychosocial health, and medications reviewed. Psychosocial assessment reveals pt lives with alone. Cassandra Burns is currently unemployed, disabled. Pt hobbies include  watching TV, talking on the telephone and spending time with her grandchildren . Pt reports her stress level is moderate. Areas of stress/anxiety include health. Pt does not exhibit signs of depression. Signs of depression include  the fact that she has to be on oxygen.She feels that she has to depend on others to carrying heavy tanks and that causes her to feel down..PHQ2/9 score 2/9. Cassandra Burns shows good  coping skills with positive outlook on life. Offered emotional support and reassurance. Will continue to monitor and evaluate progress toward psychosocial goal of assisting her with her depression and obtaining  a POC as she request.. Physical assessment reveals heart rate is tachycardic, breath sounds are diminished to auscultation, no wheezes, rales, or rhonchi. Grip strength equal, strong. Distal pulses present. Cassandra Burns reports she does take medications as prescribed. Patient states she follows a diabetic diet. The patient has been aggressively trying to lose weight by unhealthy means.. Pt's weight will be monitored closely. Demonstration and practice of PLB using pulse oximeter. Cassandra Burns able to return demonstration satisfactorily. Safety and hand hygiene in the exercise area reviewed with patient. Cassandra Burns voices understanding of the information reviewed. Department expectations discussed  with patient and achievable goals were set. The patient shows enthusiasm about attending the program and we look forward to working with Cassandra Burns. Cassandra Burns completed a 6 min walk test today and is scheduled to begin exercise on 11/20/21 at 1:15 pm.  ? ?1030-1240 ?Cassandra Burns ? ? ?

## 2021-11-14 NOTE — Progress Notes (Signed)
Pulmonary Individual Treatment Plan ? ?Patient Details  ?Name: Cassandra Burns ?MRN: [id number] ?Date of Birth: January 30, 1966 ?Referring Provider:   ? ?Initial Encounter Date:  ? ?Visit Diagnosis: Pulmonary sarcoidosis ([place]) ? ?Patient's Home Medications on Admission:  ? ?Current Outpatient Medications:  ?  albuterol (PROVENTIL) (2.5 MG/3ML) 0.083% nebulizer solution, Take 3 mLs (2.5 mg total) by nebulization every 6 (six) hours as needed for wheezing or shortness of breath., Disp: 75 mL, Rfl: 12 ?  albuterol (VENTOLIN HFA) 108 (90 Base) MCG/ACT inhaler, Inhale 1-2 puffs into the lungs every 6 (six) hours as needed for wheezing or shortness of breath., Disp: 18 g, Rfl: 6 ?  allopurinol (ZYLOPRIM) 100 MG tablet, Take 100 mg by mouth 2 (two) times daily., Disp: , Rfl:  ?  amLODipine (NORVASC) 10 MG tablet, Take 1 tablet (10 mg total) by mouth daily after lunch., Disp: 30 tablet, Rfl: 3 ?  FLUoxetine (PROZAC) 40 MG capsule, Take 40 mg by mouth daily., Disp: , Rfl:  ?  fluticasone (FLONASE) 50 MCG/ACT nasal spray, Place 1 spray into both nostrils daily as needed for allergies or rhinitis., Disp: , Rfl:  ?  Fluticasone-Umeclidin-Vilant (TRELEGY ELLIPTA) 200-62.5-25 MCG/ACT AEPB, Inhale 1 puff into the lungs daily., Disp: , Rfl:  ?  furosemide (LASIX) 20 MG tablet, Take 1 tablet (20 mg total) by mouth daily. (Patient taking differently: Take 20 mg by mouth daily as needed for edema.), Disp: 90 tablet, Rfl: 0 ?  gabapentin (NEURONTIN) 800 MG tablet, Take 800 mg by mouth 3 (three) times daily., Disp: , Rfl:  ?  labetalol (NORMODYNE) 100 MG tablet, Take 100 mg by mouth daily., Disp: , Rfl:  ?  losartan-hydrochlorothiazide (HYZAAR) 100-25 MG tablet, Take 1 tablet by mouth daily after lunch., Disp: , Rfl:  ?  OXYGEN, Inhale 2-3 L into the lungs continuous., Disp: , Rfl:  ?  rosuvastatin (CRESTOR) 40 MG tablet, Take 1 tablet (40 mg total) by mouth daily., Disp: 90 tablet, Rfl: 3 ?  SYNJARDY XR 25-1000 MG TB24, Take 1 tablet  by mouth daily., Disp: , Rfl:  ?  TOUJEO SOLOSTAR 300 UNIT/ML Solostar Pen, Inject 40 Units into the skin 2 (two) times daily after a meal. (Patient taking differently: Inject 60 Units into the skin 2 (two) times daily after a meal.), Disp: , Rfl:  ?  Vitamin D, Ergocalciferol, (DRISDOL) 1.25 MG (50000 UNIT) CAPS capsule, Take 50,000 Units by mouth every Monday., Disp: , Rfl:  ?  budesonide-formoterol (SYMBICORT) 160-4.5 MCG/ACT inhaler, Inhale 2 puffs into the lungs 2 (two) times daily. (Patient not taking: Reported on 11/14/2021), Disp: 1 Inhaler, Rfl: 6 ? ?Past Medical History: ?Past Medical History:  ?Diagnosis Date  ? Anxiety   ? Asthma   ? Bone pain 06/07/2013  ? Depression   ? Diabetes mellitus 12/18/2012  ? NIDDM  ? Hernia of abdominal cavity 10/25/2013  ? History of anemia 07/26/2013  ? Hypertension   ? Iron deficiency anemia due to chronic blood loss   ? Lymphadenopathy 06/07/2013  ? Obesity 12/18/2012  ? Psoriasis   ? Sarcoidosis 07/26/2013  ? Sleep apnea   ? uses bipap  ? ? ?Tobacco Use: ?Social History  ? ?Tobacco Use  ?Smoking Status Former  ? Types: Cigars  ? Quit date: 06/30/2017  ? Years since quitting: 4.3  ?Smokeless Tobacco Never  ?Tobacco Comments  ? Smokes Marijuana ("here and there") (as needed)  ? ? ?Labs: ?Review Flowsheet   ? ?  ?  Latest Ref  Rng & Units 07/12/2014 03/23/2021 04/23/2021 09/27/2021  ?Labs for ITP Cardiac and Pulmonary Rehab  ?Cholestrol <200 mg/dL  258      ?LDL (calc) mg/dL (calc)  182      ?HDL-C > OR = 50 mg/dL  39      ?Trlycerides <150 mg/dL  206      ?Hemoglobin A1c 4.8 - 5.6 % 10.0    8.1   10.2    ?PH, Arterial 7.35 - 7.45      ?PCO2 arterial 32 - 48 mmHg      ?Bicarbonate 20.0 - 28.0 mmol/L    24.2    ?Acid-base deficit 0.0 - 2.0 mmol/L    0.5    ?O2 Saturation %    88    ? ?  09/29/2021  ?Labs for ITP Cardiac and Pulmonary Rehab  ?Cholestrol   ?LDL (calc)   ?HDL-C   ?Trlycerides   ?Hemoglobin A1c   ?PH, Arterial 7.3    ?PCO2 arterial 55    ?Bicarbonate 27.1    ?Acid-base  deficit 0.1    ?O2 Saturation 94.6    ?  ? ? Multiple values from one day are sorted in reverse-chronological order  ?  ?  ? ? ?Capillary Blood Glucose: ?Lab Results  ?Component Value Date  ? GLUCAP 256 (H) 09/30/2021  ? GLUCAP 188 (H) 09/30/2021  ? GLUCAP 180 (H) 09/30/2021  ? GLUCAP 364 (H) 09/29/2021  ? GLUCAP 277 (H) 09/29/2021  ? ? ? ?Pulmonary Assessment Scores: ? ?UCSD: ?Self-administered rating of dyspnea associated with activities of daily living (ADLs) ?6-point scale (0 = "not at all" to 5 = "maximal or unable to do because of breathlessness")  ?Scoring Scores range from 0 to 120.  Minimally important difference is 5 units ? ?CAT: ?CAT can identify the health impairment of COPD patients and is better correlated with disease progression.  ?CAT has a scoring range of zero to 40. The CAT score is classified into four groups of low (less than 10), medium (10 - 20), high (21-30) and very high (31-40) based on the impact level of disease on health status. A CAT score over 10 suggests significant symptoms.  A worsening CAT score could be explained by an exacerbation, poor medication adherence, poor inhaler technique, or progression of COPD or comorbid conditions.  ?CAT MCID is 2 points ? ?mMRC: ?mMRC (Modified Medical Research Council) Dyspnea Scale is used to assess the degree of baseline functional disability in patients of respiratory disease due to dyspnea. ?No minimal important difference is established. A decrease in score of 1 point or greater is considered a positive change.  ? ?Pulmonary Function Assessment: ? Pulmonary Function Assessment - 11/14/21 1229   ? ?  ? Breath  ? Bilateral Breath Sounds Decreased   ? Shortness of Breath Yes;Limiting activity   ? ?  ?  ? ?  ? ? ?Exercise Target Goals: ?Exercise Program Goal: ?Individual exercise prescription set using results from initial 6 min walk test and THRR while considering  patient?s activity barriers and safety.  ? ?Exercise Prescription Goal: ?Initial  exercise prescription builds to 30-45 minutes a day of aerobic activity, 2-3 days per week.  Home exercise guidelines will be given to patient during program as part of exercise prescription that the participant will acknowledge. ? ?Activity Barriers & Risk Stratification: ? Activity Barriers & Cardiac Risk Stratification - 11/14/21 1224   ? ?  ? Activity Barriers & Cardiac Risk Stratification  ? Activity Barriers  Arthritis;Back Problems;Joint Problems;Deconditioning;Muscular Weakness;Shortness of Breath   ? Cardiac Risk Stratification Moderate   ? ?  ?  ? ?  ? ? ?6 Minute Walk: ? 6 Minute Walk   ? ? [place] Name 11/14/21 1216  ?  ?  ?  ? 6 Minute Walk  ? Phase Initial    ? Distance 800 feet    ? Walk Time 6 minutes    ? # of Rest Breaks 1  2:45-3:10    ? MPH 1.52    ? METS 4.37    ? RPE 11    ? Perceived Dyspnea  2    ? VO2 Peak 15.31    ? Symptoms No    ? Resting HR 110 bpm    ? Resting BP 110/70    ? Resting Oxygen Saturation  93 %    ? Exercise Oxygen Saturation  during 6 min walk 88 %    ? Max Ex. HR 145 bpm    ? Max Ex. BP 148/70    ? 2 Minute Post BP 128/70    ?  ? Interval HR  ? 1 Minute HR 124    ? 2 Minute HR 130    ? 3 Minute HR 128    ? 4 Minute HR 132    ? 5 Minute HR 141    ? 6 Minute HR 145    ? 2 Minute Post HR 109    ? Interval Heart Rate? Yes    ?  ? Interval Oxygen  ? Interval Oxygen? Yes    ? Baseline Oxygen Saturation % 93 %    ? 1 Minute Oxygen Saturation % 93 %    ? 1 Minute Liters of Oxygen 3 L    ? 2 Minute Oxygen Saturation % 89 %    ? 2 Minute Liters of Oxygen 3 L    ? 3 Minute Oxygen Saturation % 88 %    ? 3 Minute Liters of Oxygen 3 L    ? 4 Minute Oxygen Saturation % 90 %    ? 4 Minute Liters of Oxygen 3 L    ? 5 Minute Oxygen Saturation % 88 %    ? 5 Minute Liters of Oxygen 3 L    ? 6 Minute Oxygen Saturation % 88 %    ? 6 Minute Liters of Oxygen 3 L    ? 2 Minute Post Oxygen Saturation % 98 %    ? 2 Minute Post Liters of Oxygen 3 L    ? ?  ?  ? ?  ? ? ?Oxygen Initial Assessment: ? Oxygen  Initial Assessment - 11/14/21 1228   ? ?  ? Initial 6 min Walk  ? Oxygen Used Continuous   ? Liters per minute 3   ?  ? Program Oxygen Prescription  ? Program Oxygen Prescription Continuous   ?  ? Jacqulynn Cadet

## 2021-11-14 NOTE — Progress Notes (Signed)
Cassandra Burns wants to lose weight. She states that 90% of her problems are related to her weight. She hopes that she will be able to come off of her oxygen if she loses weight.We discussed that that may not happen due to her lung disease. She feels that the depression that she has is related to having to be on oxygen. Having to depend on others makes her feel helpless.She is having gout pain in both of her wrists,carrying her oxygen is difficult due to this.Renella has spoke with her DME and they tell her that she is not eligible for a POC with them. She has also contacted [employer] to see if she can switch to them, but thus far she has not been successful. We will assist her as she request. ?

## 2021-11-14 NOTE — Progress Notes (Signed)
Pulmonary Individual Treatment Plan ? ?Patient Details  ?Name: Cassandra Burns ?MRN: [id number] ?Date of Birth: 29-May-1966 ?Referring Provider:   ? ?Initial Encounter Date:  ? ?Visit Diagnosis: Pulmonary sarcoidosis ([place]) ? ?Patient's Home Medications on Admission:  ? ?Current Outpatient Medications:  ?  albuterol (PROVENTIL) (2.5 MG/3ML) 0.083% nebulizer solution, Take 3 mLs (2.5 mg total) by nebulization every 6 (six) hours as needed for wheezing or shortness of breath., Disp: 75 mL, Rfl: 12 ?  albuterol (VENTOLIN HFA) 108 (90 Base) MCG/ACT inhaler, Inhale 1-2 puffs into the lungs every 6 (six) hours as needed for wheezing or shortness of breath., Disp: 18 g, Rfl: 6 ?  allopurinol (ZYLOPRIM) 100 MG tablet, Take 100 mg by mouth 2 (two) times daily., Disp: , Rfl:  ?  amLODipine (NORVASC) 10 MG tablet, Take 1 tablet (10 mg total) by mouth daily after lunch., Disp: 30 tablet, Rfl: 3 ?  FLUoxetine (PROZAC) 40 MG capsule, Take 40 mg by mouth daily., Disp: , Rfl:  ?  fluticasone (FLONASE) 50 MCG/ACT nasal spray, Place 1 spray into both nostrils daily as needed for allergies or rhinitis., Disp: , Rfl:  ?  Fluticasone-Umeclidin-Vilant (TRELEGY ELLIPTA) 200-62.5-25 MCG/ACT AEPB, Inhale 1 puff into the lungs daily., Disp: , Rfl:  ?  furosemide (LASIX) 20 MG tablet, Take 1 tablet (20 mg total) by mouth daily. (Patient taking differently: Take 20 mg by mouth daily as needed for edema.), Disp: 90 tablet, Rfl: 0 ?  gabapentin (NEURONTIN) 800 MG tablet, Take 800 mg by mouth 3 (three) times daily., Disp: , Rfl:  ?  labetalol (NORMODYNE) 100 MG tablet, Take 100 mg by mouth daily., Disp: , Rfl:  ?  losartan-hydrochlorothiazide (HYZAAR) 100-25 MG tablet, Take 1 tablet by mouth daily after lunch., Disp: , Rfl:  ?  OXYGEN, Inhale 2-3 L into the lungs continuous., Disp: , Rfl:  ?  rosuvastatin (CRESTOR) 40 MG tablet, Take 1 tablet (40 mg total) by mouth daily., Disp: 90 tablet, Rfl: 3 ?  SYNJARDY XR 25-1000 MG TB24, Take 1 tablet  by mouth daily., Disp: , Rfl:  ?  TOUJEO SOLOSTAR 300 UNIT/ML Solostar Pen, Inject 40 Units into the skin 2 (two) times daily after a meal. (Patient taking differently: Inject 60 Units into the skin 2 (two) times daily after a meal.), Disp: , Rfl:  ?  Vitamin D, Ergocalciferol, (DRISDOL) 1.25 MG (50000 UNIT) CAPS capsule, Take 50,000 Units by mouth every Monday., Disp: , Rfl:  ?  budesonide-formoterol (SYMBICORT) 160-4.5 MCG/ACT inhaler, Inhale 2 puffs into the lungs 2 (two) times daily. (Patient not taking: Reported on 11/14/2021), Disp: 1 Inhaler, Rfl: 6 ? ?Past Medical History: ?Past Medical History:  ?Diagnosis Date  ? Anxiety   ? Asthma   ? Bone pain 06/07/2013  ? Depression   ? Diabetes mellitus 12/18/2012  ? NIDDM  ? Hernia of abdominal cavity 10/25/2013  ? History of anemia 07/26/2013  ? Hypertension   ? Iron deficiency anemia due to chronic blood loss   ? Lymphadenopathy 06/07/2013  ? Obesity 12/18/2012  ? Psoriasis   ? Sarcoidosis 07/26/2013  ? Sleep apnea   ? uses bipap  ? ? ?Tobacco Use: ?Social History  ? ?Tobacco Use  ?Smoking Status Former  ? Types: Cigars  ? Quit date: 06/30/2017  ? Years since quitting: 4.3  ?Smokeless Tobacco Never  ?Tobacco Comments  ? Smokes Marijuana ("here and there") (as needed)  ? ? ?Labs: ?Review Flowsheet   ? ?  ?  Latest Ref  Rng & Units 07/12/2014 03/23/2021 04/23/2021 09/27/2021  ?Labs for ITP Cardiac and Pulmonary Rehab  ?Cholestrol <200 mg/dL  258      ?LDL (calc) mg/dL (calc)  182      ?HDL-C > OR = 50 mg/dL  39      ?Trlycerides <150 mg/dL  206      ?Hemoglobin A1c 4.8 - 5.6 % 10.0    8.1   10.2    ?PH, Arterial 7.35 - 7.45      ?PCO2 arterial 32 - 48 mmHg      ?Bicarbonate 20.0 - 28.0 mmol/L    24.2    ?Acid-base deficit 0.0 - 2.0 mmol/L    0.5    ?O2 Saturation %    88    ? ?  09/29/2021  ?Labs for ITP Cardiac and Pulmonary Rehab  ?Cholestrol   ?LDL (calc)   ?HDL-C   ?Trlycerides   ?Hemoglobin A1c   ?PH, Arterial 7.3    ?PCO2 arterial 55    ?Bicarbonate 27.1    ?Acid-base  deficit 0.1    ?O2 Saturation 94.6    ?  ? ? Multiple values from one day are sorted in reverse-chronological order  ?  ?  ? ? ?Capillary Blood Glucose: ?Lab Results  ?Component Value Date  ? GLUCAP 256 (H) 09/30/2021  ? GLUCAP 188 (H) 09/30/2021  ? GLUCAP 180 (H) 09/30/2021  ? GLUCAP 364 (H) 09/29/2021  ? GLUCAP 277 (H) 09/29/2021  ? ? ? ?Pulmonary Assessment Scores: ? Pulmonary Assessment Scores   ? ? [place] Name 11/14/21 1440  ?  ?  ?  ? ADL UCSD  ? ADL Phase Entry    ? SOB Score total 64    ?  ? CAT Score  ? CAT Score 19    ? ?  ?  ? ?  ? ?UCSD: ?Self-administered rating of dyspnea associated with activities of daily living (ADLs) ?6-point scale (0 = "not at all" to 5 = "maximal or unable to do because of breathlessness")  ?Scoring Scores range from 0 to 120.  Minimally important difference is 5 units ? ?CAT: ?CAT can identify the health impairment of COPD patients and is better correlated with disease progression.  ?CAT has a scoring range of zero to 40. The CAT score is classified into four groups of low (less than 10), medium (10 - 20), high (21-30) and very high (31-40) based on the impact level of disease on health status. A CAT score over 10 suggests significant symptoms.  A worsening CAT score could be explained by an exacerbation, poor medication adherence, poor inhaler technique, or progression of COPD or comorbid conditions.  ?CAT MCID is 2 points ? ?mMRC: ?mMRC (Modified Medical Research Council) Dyspnea Scale is used to assess the degree of baseline functional disability in patients of respiratory disease due to dyspnea. ?No minimal important difference is established. A decrease in score of 1 point or greater is considered a positive change.  ? ?Pulmonary Function Assessment: ? Pulmonary Function Assessment - 11/14/21 1229   ? ?  ? Breath  ? Bilateral Breath Sounds Decreased   ? Shortness of Breath Yes;Limiting activity   ? ?  ?  ? ?  ? ? ?Exercise Target Goals: ?Exercise Program Goal: ?Individual  exercise prescription set using results from initial 6 min walk test and THRR while considering  patient?s activity barriers and safety.  ? ?Exercise Prescription Goal: ?Initial exercise prescription builds to 30-45 minutes a day of  aerobic activity, 2-3 days per week.  Home exercise guidelines will be given to patient during program as part of exercise prescription that the participant will acknowledge. ? ?Activity Barriers & Risk Stratification: ? Activity Barriers & Cardiac Risk Stratification - 11/14/21 1224   ? ?  ? Activity Barriers & Cardiac Risk Stratification  ? Activity Barriers Arthritis;Back Problems;Joint Problems;Deconditioning;Muscular Weakness;Shortness of Breath   ? Cardiac Risk Stratification Moderate   ? ?  ?  ? ?  ? ? ?6 Minute Walk: ? 6 Minute Walk   ? ? [place] Name 11/14/21 1216  ?  ?  ?  ? 6 Minute Walk  ? Phase Initial    ? Distance 800 feet    ? Walk Time 6 minutes    ? # of Rest Breaks 1  2:45-3:10    ? MPH 1.52    ? METS 4.37    ? RPE 11    ? Perceived Dyspnea  2    ? VO2 Peak 15.31    ? Symptoms No    ? Resting HR 110 bpm    ? Resting BP 110/70    ? Resting Oxygen Saturation  93 %    ? Exercise Oxygen Saturation  during 6 min walk 88 %    ? Max Ex. HR 145 bpm    ? Max Ex. BP 148/70    ? 2 Minute Post BP 128/70    ?  ? Interval HR  ? 1 Minute HR 124    ? 2 Minute HR 130    ? 3 Minute HR 128    ? 4 Minute HR 132    ? 5 Minute HR 141    ? 6 Minute HR 145    ? 2 Minute Post HR 109    ? Interval Heart Rate? Yes    ?  ? Interval Oxygen  ? Interval Oxygen? Yes    ? Baseline Oxygen Saturation % 93 %    ? 1 Minute Oxygen Saturation % 93 %    ? 1 Minute Liters of Oxygen 3 L    ? 2 Minute Oxygen Saturation % 89 %    ? 2 Minute Liters of Oxygen 3 L    ? 3 Minute Oxygen Saturation % 88 %    ? 3 Minute Liters of Oxygen 3 L    ? 4 Minute Oxygen Saturation % 90 %    ? 4 Minute Liters of Oxygen 3 L    ? 5 Minute Oxygen Saturation % 88 %    ? 5 Minute Liters of Oxygen 3 L    ? 6 Minute Oxygen Saturation % 88  %    ? 6 Minute Liters of Oxygen 3 L    ? 2 Minute Post Oxygen Saturation % 98 %    ? 2 Minute Post Liters of Oxygen 3 L    ? ?  ?  ? ?  ? ? ?Oxygen Initial Assessment: ? Oxygen Initial Assessment - 11/14/21 1

## 2021-11-15 ENCOUNTER — Encounter (HOSPITAL_COMMUNITY): Payer: Medicare HMO

## 2021-11-18 ENCOUNTER — Encounter (HOSPITAL_COMMUNITY): Payer: Self-pay | Admitting: *Deleted

## 2021-11-18 ENCOUNTER — Ambulatory Visit (INDEPENDENT_AMBULATORY_CARE_PROVIDER_SITE_OTHER): Payer: Medicare HMO

## 2021-11-18 ENCOUNTER — Ambulatory Visit (HOSPITAL_COMMUNITY)
Admission: EM | Admit: 2021-11-18 | Discharge: 2021-11-18 | Disposition: A | Discharge status: Home / Self Care | Payer: Medicare HMO | Attending: Nurse Practitioner | Admitting: Nurse Practitioner

## 2021-11-18 DIAGNOSIS — M25511 Pain in right shoulder: Secondary | ICD-10-CM

## 2021-11-18 MED ORDER — METHYLPREDNISOLONE SODIUM SUCC 125 MG IJ SOLR
INTRAMUSCULAR | Status: AC
  Filled 2021-11-18: qty 2

## 2021-11-18 MED ORDER — ACETAMINOPHEN 500 MG PO TABS: 500.0000 mg | ORAL_TABLET | Freq: Four times a day (QID) | ORAL | 0 refills | Status: DC | PRN

## 2021-11-18 MED ORDER — METHYLPREDNISOLONE ACETATE 40 MG/ML IJ SUSP
40.0000 mg | Freq: Once | INTRAMUSCULAR | Status: AC
  Administered 2021-11-18: 40 mg via INTRAMUSCULAR

## 2021-11-18 MED ORDER — METHYLPREDNISOLONE ACETATE 40 MG/ML IJ SUSP
INTRAMUSCULAR | Status: AC
  Filled 2021-11-18: qty 1

## 2021-11-18 NOTE — ED Triage Notes (Signed)
Denies injury. ? ?C/O intermittent right shoulder pain onset approx 1 wk ago with significant worsening yesterday. States "I have no mobility" due to pain. States pain radiates down into right upper arm; pt holding RUE. RUE radial pulse 2+; denies any parasthesias. ?

## 2021-11-18 NOTE — ED Provider Notes (Signed)
?[place] ? ? ? ?CSN: [id number] ?Arrival date & time: 11/18/21  1006 ? ? ?  ? ?History   ?Chief Complaint ?Chief Complaint  ?Patient presents with  ? Shoulder Pain  ? ? ?HPI ?Cassandra Burns is a 56 y.o. female.  ? ?Patient presents for right shoulder pain that has been ongoing 1 week.  She reports the pain is severe and starts in her right shoulder, moving down to her right elbow.  She denies any recent accident, trauma, or injury.  She reports the pain is sharp and constant when she is moving her shoulder forearm.  She denies any new numbness or tingling in her fingertips, she does have some chronic neuropathy.  She denies weakness, decreased grip strength, redness, bruising, swelling, fevers, nausea/vomiting.  She has not taken anything for the pain.  She has tried topical Biofreeze and Voltaren without any relief.  She is requesting a sling today. ? ? ?Past Medical History:  ?Diagnosis Date  ? Anxiety   ? Asthma   ? Bone pain 06/07/2013  ? Depression   ? Diabetes mellitus 12/18/2012  ? NIDDM  ? Hernia of abdominal cavity 10/25/2013  ? History of anemia 07/26/2013  ? Hypertension   ? Iron deficiency anemia due to chronic blood loss   ? Lymphadenopathy 06/07/2013  ? Obesity 12/18/2012  ? Psoriasis   ? Sarcoidosis 07/26/2013  ? Sleep apnea   ? uses bipap  ? ? ?Patient Active Problem List  ? Diagnosis Date Noted  ? Leg pain 09/29/2021  ? Somnolence 09/29/2021  ? AKI (acute kidney injury) ([place]) 09/27/2021  ? Elevated troponin 09/27/2021  ? Gout 09/27/2021  ? Diabetic neuropathy ([place]) 09/27/2021  ? HLD (hyperlipidemia) 09/27/2021  ? Acute on chronic respiratory failure with hypoxia ([place]) 04/23/2021  ? Marijuana use 01/03/2021  ? Asthma 05/19/2017  ? HTN (hypertension)   ? SOB (shortness of breath)   ? Rectal bleeding 04/14/2014  ? Diastasis recti 11/04/2013  ? Hernia of abdominal cavity 10/25/2013  ? Sarcoidosis 07/26/2013  ? Unspecified deficiency anemia 07/26/2013  ? Bone pain 06/07/2013  ?  Lymphadenopathy 06/07/2013  ? Obesity hypoventilation syndrome 04/06/2013  ? Depression, major, recurrent, moderate ([place]) 01/19/2013  ? Generalized anxiety disorder 01/19/2013  ? OSA (obstructive sleep apnea) 11/19/2012  ? Mediastinal lymphadenopathy 11/03/2012  ? DOE (dyspnea on exertion) 11/03/2012  ? DM (diabetes mellitus) ([place]) 11/07/2011  ? Morbid obesity ([place]) 11/07/2011  ? Essential hypertension   ? ? ?Past Surgical History:  ?Procedure Laterality Date  ? TUBAL LIGATION    ? ? ?OB History   ? ? Gravida  ?2  ? Para  ?2  ? Term  ?   ? Preterm  ?   ? AB  ?   ? Living  ?   ?  ? ? SAB  ?   ? IAB  ?   ? Ectopic  ?   ? Multiple  ?   ? Live Births  ?   ?   ?  ?  ? ? ? ?Home Medications   ? ?Prior to Admission medications   ?Medication Sig Start Date End Date Taking? Authorizing Provider  ?acetaminophen (TYLENOL) 500 MG tablet Take 1 tablet (500 mg total) by mouth every 6 (six) hours as needed. 11/18/21  Yes Eulogio Bear, NP  ?albuterol (PROVENTIL) (2.5 MG/3ML) 0.083% nebulizer solution Take 3 mLs (2.5 mg total) by nebulization every 6 (six) hours as needed for wheezing or shortness of breath. 05/19/17  Yes Magdalen Spatz, NP  ?albuterol (VENTOLIN HFA) 108 (90 Base) MCG/ACT inhaler Inhale 1-2 puffs into the lungs every 6 (six) hours as needed for wheezing or shortness of breath. 11/09/19  Yes Martyn Ehrich, NP  ?allopurinol (ZYLOPRIM) 100 MG tablet Take 100 mg by mouth 2 (two) times daily.   Yes [provider]  ?amLODipine (NORVASC) 10 MG tablet Take 1 tablet (10 mg total) by mouth daily after lunch. 09/30/21  Yes Oswald Hillock, MD  ?FLUoxetine (PROZAC) 40 MG capsule Take 40 mg by mouth daily.   Yes [provider]  ?Fluticasone-Umeclidin-Vilant (TRELEGY ELLIPTA) 200-62.5-25 MCG/ACT AEPB Inhale 1 puff into the lungs daily.   Yes [provider]  ?furosemide (LASIX) 20 MG tablet Take 1 tablet (20 mg total) by mouth daily. ?Patient taking differently: Take 20 mg by mouth daily as  needed for edema. 04/27/21 09/28/22 Yes Mariel Aloe, MD  ?gabapentin (NEURONTIN) 800 MG tablet Take 800 mg by mouth 3 (three) times daily. 09/14/20  Yes [provider]  ?labetalol (NORMODYNE) 100 MG tablet Take 100 mg by mouth daily. 10/27/19  Yes [provider]  ?losartan-hydrochlorothiazide (HYZAAR) 100-25 MG tablet Take 1 tablet by mouth daily after lunch.   Yes [provider]  ?OXYGEN Inhale 2-3 L into the lungs continuous.   Yes [provider]  ?SYNJARDY XR 25-1000 MG TB24 Take 1 tablet by mouth daily. 09/25/21  Yes [provider]  ?Obie Dredge 300 UNIT/ML Solostar Pen Inject 40 Units into the skin 2 (two) times daily after a meal. ?Patient taking differently: Inject 60 Units into the skin 2 (two) times daily after a meal. 04/27/21  Yes Mariel Aloe, MD  ?Vitamin D, Ergocalciferol, (DRISDOL) 1.25 MG (50000 UNIT) CAPS capsule Take 50,000 Units by mouth every Monday.   Yes [provider]  ?budesonide-formoterol (SYMBICORT) 160-4.5 MCG/ACT inhaler Inhale 2 puffs into the lungs 2 (two) times daily. ?Patient not taking: Reported on 11/14/2021 09/29/17   Magdalen Spatz, NP  ?fluticasone [facility]) 50 MCG/ACT nasal spray Place 1 spray into both nostrils daily as needed for allergies or rhinitis. 10/13/19   [provider]  ?rosuvastatin (CRESTOR) 40 MG tablet Take 1 tablet (40 mg total) by mouth daily. 06/11/21 09/28/22  Lelon Perla, MD  ? ? ?Family History ?Family History  ?Problem Relation Age of Onset  ? Diabetes type II Father   ? Asthma Father   ? Hypertension Father   ? Heart murmur Father   ? Alcohol abuse Father   ? Drug abuse Father   ? Hypertension Mother   ? Alcohol abuse Mother   ? Drug abuse Mother   ? ? ?Social History ?Social History  ? ?Tobacco Use  ? Smoking status: Former  ?  Types: Cigars  ?  Quit date: 06/30/2017  ?  Years since quitting: 4.3  ? Smokeless tobacco: Never  ? Tobacco comments:  ?  Smokes Marijuana ("here and  there") (as needed)  ?Vaping Use  ? Vaping Use: Never used  ?Substance Use Topics  ? Alcohol use: Yes  ?  Comment: occasionally  ? Drug use: Not Currently  ?  Types: Marijuana  ? ? ? ?Allergies   ?Oxycontin [oxycodone] ? ? ?Review of Systems ?Review of Systems ?Per HPI ? ?Physical Exam ?Triage Vital Signs ?ED Triage Vitals [11/18/21 1033]  ?Enc Vitals Group  ?   BP (!) 155/91  ?   Pulse Rate 100  ?   Resp [name])  24  ?   Temp 97.8 ?F (36.6 ?C)  ?   Temp Source Oral  ?   SpO2 92 %  ?   Weight   ?   Height   ?   Head Circumference   ?   Peak Flow   ?   Pain Score 10  ?   Pain Loc   ?   Pain Edu?   ?   Excl. in [place]?   ? ?No data found. ? ?Updated Vital Signs ?BP (!) 155/91   Pulse 100   Temp 97.8 ?F (36.6 ?C) (Oral)   Resp (!) 24   LMP 06/13/2012   SpO2 92%  ? ?Visual Acuity ?Right Eye Distance:   ?Left Eye Distance:   ?Bilateral Distance:   ? ?Right Eye Near:   ?Left Eye Near:    ?Bilateral Near:    ? ?Physical Exam ?Vitals and nursing note reviewed.  ?Constitutional:   ?   General: She is not in acute distress. ?   Appearance: Normal appearance. She is obese. She is not toxic-appearing.  ?Pulmonary:  ?   Effort: Pulmonary effort is normal. No respiratory distress.  ?   Comments: Wearing oxygen via nasal cannula-this is her baseline ?Musculoskeletal:  ?   Right shoulder: Tenderness and bony tenderness present. No swelling, deformity or crepitus. Normal strength. Normal pulse.  ?   Left shoulder: Normal.  ?   Right upper arm: Tenderness present. No swelling, edema or deformity.  ?   Right elbow: Normal. No swelling or deformity. Normal range of motion. No tenderness.  ?   Right forearm: Normal. No tenderness or bony tenderness.  ?   Right wrist: Normal. Normal range of motion. Normal pulse.  ?   Right hand: Normal range of motion. Normal sensation. There is no disruption of two-point discrimination. Normal capillary refill. Normal pulse.  ?     Arms: ? ?   Comments: Unable to perform range of motion to shoulder as  patient is in significant pain and asked me not to move her arm.  She is tender to palpation in the areas marked.  No obvious swelling, deformity, erythema.  ?Skin: ?   General: Skin is warm and dry.  ?   Capillary Refill: Capil

## 2021-11-18 NOTE — Discharge Instructions (Addendum)
-   The x-ray today does not show any acute findings of the right shoulder ?-We have given you a shot of steroid today to help with the inflammation; please monitor your breathing symptoms closely and if your symptoms worsen, please follow-up with your pulmonologist or rheumatologist as soon as able. ?- I do not recommend wearing a sling as this can cause permanent issues with your right shoulder ?-Please start taking Tylenol 500 mg every 6 hours as needed for pain ?-Call sports medicine or Ortho care for further evaluation and treatment of your shoulder pain ?

## 2021-11-19 ENCOUNTER — Telehealth (HOSPITAL_COMMUNITY): Payer: Self-pay | Admitting: *Deleted

## 2021-11-20 ENCOUNTER — Encounter (HOSPITAL_COMMUNITY): Payer: Medicare HMO

## 2021-11-20 ENCOUNTER — Encounter (HOSPITAL_COMMUNITY)
Admission: RE | Admit: 2021-11-20 | Discharge: 2021-11-20 | Disposition: A | Discharge status: Home / Self Care | Payer: Medicare HMO | Source: Ambulatory Visit | Attending: Pulmonary Disease | Admitting: Pulmonary Disease

## 2021-11-20 DIAGNOSIS — D860 Sarcoidosis of lung: Secondary | ICD-10-CM | POA: Insufficient documentation

## 2021-11-21 NOTE — Progress Notes (Signed)
Pulmonary Individual Treatment Plan ? ?Patient Details  ?Name: Cassandra Burns ?MRN: [id number] ?Date of Birth: [birth year] ?Referring Provider:   ? ?Initial Encounter Date:  ? ?Visit Diagnosis: Pulmonary sarcoidosis ([place]) ? ?Patient's Home Medications on Admission:  ? ?Current Outpatient Medications:  ?  albuterol (PROVENTIL) (2.5 MG/3ML) 0.083% nebulizer solution, Take 3 mLs (2.5 mg total) by nebulization every 6 (six) hours as needed for wheezing or shortness of breath., Disp: 75 mL, Rfl: 12 ?  albuterol (VENTOLIN HFA) 108 (90 Base) MCG/ACT inhaler, Inhale 1-2 puffs into the lungs every 6 (six) hours as needed for wheezing or shortness of breath., Disp: 18 g, Rfl: 6 ?  allopurinol (ZYLOPRIM) 100 MG tablet, Take 100 mg by mouth 2 (two) times daily., Disp: , Rfl:  ?  amLODipine (NORVASC) 10 MG tablet, Take 1 tablet (10 mg total) by mouth daily after lunch., Disp: 30 tablet, Rfl: 3 ?  FLUoxetine (PROZAC) 40 MG capsule, Take 40 mg by mouth daily., Disp: , Rfl:  ?  fluticasone (FLONASE) 50 MCG/ACT nasal spray, Place 1 spray into both nostrils daily as needed for allergies or rhinitis., Disp: , Rfl:  ?  Fluticasone-Umeclidin-Vilant (TRELEGY ELLIPTA) 200-62.5-25 MCG/ACT AEPB, Inhale 1 puff into the lungs daily., Disp: , Rfl:  ?  furosemide (LASIX) 20 MG tablet, Take 1 tablet (20 mg total) by mouth daily. (Patient taking differently: Take 20 mg by mouth daily as needed for edema.), Disp: 90 tablet, Rfl: 0 ?  gabapentin (NEURONTIN) 800 MG tablet, Take 800 mg by mouth 3 (three) times daily., Disp: , Rfl:  ?  labetalol (NORMODYNE) 100 MG tablet, Take 100 mg by mouth daily., Disp: , Rfl:  ?  losartan-hydrochlorothiazide (HYZAAR) 100-25 MG tablet, Take 1 tablet by mouth daily after lunch., Disp: , Rfl:  ?  OXYGEN, Inhale 2-3 L into the lungs continuous., Disp: , Rfl:  ?  rosuvastatin (CRESTOR) 40 MG tablet, Take 1 tablet (40 mg total) by mouth daily., Disp: 90 tablet, Rfl: 3 ?  SYNJARDY XR 25-1000 MG TB24, Take 1 tablet  by mouth daily., Disp: , Rfl:  ?  TOUJEO SOLOSTAR 300 UNIT/ML Solostar Pen, Inject 40 Units into the skin 2 (two) times daily after a meal. (Patient taking differently: Inject 60 Units into the skin 2 (two) times daily after a meal.), Disp: , Rfl:  ?  Vitamin D, Ergocalciferol, (DRISDOL) 1.25 MG (50000 UNIT) CAPS capsule, Take 50,000 Units by mouth every Monday., Disp: , Rfl:  ?  acetaminophen (TYLENOL) 500 MG tablet, Take 1 tablet (500 mg total) by mouth every 6 (six) hours as needed., Disp: 30 tablet, Rfl: 0 ?  budesonide-formoterol (SYMBICORT) 160-4.5 MCG/ACT inhaler, Inhale 2 puffs into the lungs 2 (two) times daily. (Patient not taking: Reported on 11/14/2021), Disp: 1 Inhaler, Rfl: 6 ? ?Past Medical History: ?Past Medical History:  ?Diagnosis Date  ? Anxiety   ? Asthma   ? Bone pain 06/07/2013  ? Depression   ? Diabetes mellitus 12/18/2012  ? NIDDM  ? Hernia of abdominal cavity 10/25/2013  ? History of anemia 07/26/2013  ? Hypertension   ? Iron deficiency anemia due to chronic blood loss   ? Lymphadenopathy 06/07/2013  ? Obesity 12/18/2012  ? Psoriasis   ? Sarcoidosis 07/26/2013  ? Sleep apnea   ? uses bipap  ? ? ?Tobacco Use: ?Social History  ? ?Tobacco Use  ?Smoking Status Former  ? Types: Cigars  ? Quit date: 06/30/2017  ? Years since quitting: 4.3  ?Smokeless Tobacco Never  ?  Tobacco Comments  ? Smokes Marijuana ("here and there") (as needed)  ? ? ?Labs: ?Review Flowsheet   ? ?  ?  Latest Ref Rng & Units 07/12/2014 03/23/2021 04/23/2021 09/27/2021  ?Labs for ITP Cardiac and Pulmonary Rehab  ?Cholestrol <200 mg/dL  258      ?LDL (calc) mg/dL (calc)  182      ?HDL-C > OR = 50 mg/dL  39      ?Trlycerides <150 mg/dL  206      ?Hemoglobin A1c 4.8 - 5.6 % 10.0    8.1   10.2    ?PH, Arterial 7.35 - 7.45      ?PCO2 arterial 32 - 48 mmHg      ?Bicarbonate 20.0 - 28.0 mmol/L    24.2    ?Acid-base deficit 0.0 - 2.0 mmol/L    0.5    ?O2 Saturation %    88    ? ?  09/29/2021  ?Labs for ITP Cardiac and Pulmonary Rehab  ?Cholestrol    ?LDL (calc)   ?HDL-C   ?Trlycerides   ?Hemoglobin A1c   ?PH, Arterial 7.3    ?PCO2 arterial 55    ?Bicarbonate 27.1    ?Acid-base deficit 0.1    ?O2 Saturation 94.6    ?  ? ? Multiple values from one day are sorted in reverse-chronological order  ?  ?  ? ? ?Capillary Blood Glucose: ?Lab Results  ?Component Value Date  ? GLUCAP 256 (H) 09/30/2021  ? GLUCAP 188 (H) 09/30/2021  ? GLUCAP 180 (H) 09/30/2021  ? GLUCAP 364 (H) 09/29/2021  ? GLUCAP 277 (H) 09/29/2021  ? ? ? ?Pulmonary Assessment Scores: ? Pulmonary Assessment Scores   ? ? [place] Name 11/14/21 1440  ?  ?  ?  ? ADL UCSD  ? ADL Phase Entry    ? SOB Score total 64    ?  ? CAT Score  ? CAT Score 19    ?  ? mMRC Score  ? mMRC Score 4    ? ?  ?  ? ?  ? ?UCSD: ?Self-administered rating of dyspnea associated with activities of daily living (ADLs) ?6-point scale (0 = "not at all" to 5 = "maximal or unable to do because of breathlessness")  ?Scoring Scores range from 0 to 120.  Minimally important difference is 5 units ? ?CAT: ?CAT can identify the health impairment of COPD patients and is better correlated with disease progression.  ?CAT has a scoring range of zero to 40. The CAT score is classified into four groups of low (less than 10), medium (10 - 20), high (21-30) and very high (31-40) based on the impact level of disease on health status. A CAT score over 10 suggests significant symptoms.  A worsening CAT score could be explained by an exacerbation, poor medication adherence, poor inhaler technique, or progression of COPD or comorbid conditions.  ?CAT MCID is 2 points ? ?mMRC: ?mMRC (Modified Medical Research Council) Dyspnea Scale is used to assess the degree of baseline functional disability in patients of respiratory disease due to dyspnea. ?No minimal important difference is established. A decrease in score of 1 point or greater is considered a positive change.  ? ?Pulmonary Function Assessment: ? Pulmonary Function Assessment - 11/14/21 1229   ? ?  ? Breath   ? Bilateral Breath Sounds Decreased   ? Shortness of Breath Yes;Limiting activity   ? ?  ?  ? ?  ? ? ?Exercise Target Goals: ?Exercise  Program Goal: ?Individual exercise prescription set using results from initial 6 min walk test and THRR while considering  patient?s activity barriers and safety.  ? ?Exercise Prescription Goal: ?Initial exercise prescription builds to 30-45 minutes a day of aerobic activity, 2-3 days per week.  Home exercise guidelines will be given to patient during program as part of exercise prescription that the participant will acknowledge. ? ?Activity Barriers & Risk Stratification: ? Activity Barriers & Cardiac Risk Stratification - 11/14/21 1224   ? ?  ? Activity Barriers & Cardiac Risk Stratification  ? Activity Barriers Arthritis;Back Problems;Joint Problems;Deconditioning;Muscular Weakness;Shortness of Breath   ? Cardiac Risk Stratification Moderate   ? ?  ?  ? ?  ? ? ?6 Minute Walk: ? 6 Minute Walk   ? ? [place] Name 11/14/21 1216  ?  ?  ?  ? 6 Minute Walk  ? Phase Initial    ? Distance 800 feet    ? Walk Time 6 minutes    ? # of Rest Breaks 1  2:45-3:10    ? MPH 1.52    ? METS 4.37    ? RPE 11    ? Perceived Dyspnea  2    ? VO2 Peak 15.31    ? Symptoms No    ? Resting HR 110 bpm    ? Resting BP 110/70    ? Resting Oxygen Saturation  93 %    ? Exercise Oxygen Saturation  during 6 min walk 88 %    ? Max Ex. HR 145 bpm    ? Max Ex. BP 148/70    ? 2 Minute Post BP 128/70    ?  ? Interval HR  ? 1 Minute HR 124    ? 2 Minute HR 130    ? 3 Minute HR 128    ? 4 Minute HR 132    ? 5 Minute HR 141    ? 6 Minute HR 145    ? 2 Minute Post HR 109    ? Interval Heart Rate? Yes    ?  ? Interval Oxygen  ? Interval Oxygen? Yes    ? Baseline Oxygen Saturation % 93 %    ? 1 Minute Oxygen Saturation % 93 %    ? 1 Minute Liters of Oxygen 3 L    ? 2 Minute Oxygen Saturation % 89 %    ? 2 Minute Liters of Oxygen 3 L    ? 3 Minute Oxygen Saturation % 88 %    ? 3 Minute Liters of Oxygen 3 L    ? 4 Minute Oxygen  Saturation % 90 %    ? 4 Minute Liters of Oxygen 3 L    ? 5 Minute Oxygen Saturation % 88 %    ? 5 Minute Liters of Oxygen 3 L    ? 6 Minute Oxygen Saturation % 88 %    ? 6 Minute Liters of Oxygen 3 L

## 2021-11-22 ENCOUNTER — Encounter (HOSPITAL_COMMUNITY): Payer: Medicare HMO

## 2021-11-27 ENCOUNTER — Encounter (HOSPITAL_COMMUNITY)
Admission: RE | Admit: 2021-11-27 | Discharge: 2021-11-27 | Disposition: A | Discharge status: Home / Self Care | Payer: Medicare HMO | Source: Ambulatory Visit | Attending: Pulmonary Disease | Admitting: Pulmonary Disease

## 2021-11-27 ENCOUNTER — Encounter (HOSPITAL_COMMUNITY): Payer: Medicare HMO

## 2021-11-27 VITALS — Wt 227.1 lb

## 2021-11-27 DIAGNOSIS — I5032 Chronic diastolic (congestive) heart failure: Secondary | ICD-10-CM | POA: Diagnosis not present

## 2021-11-27 DIAGNOSIS — J849 Interstitial pulmonary disease, unspecified: Secondary | ICD-10-CM | POA: Insufficient documentation

## 2021-11-27 DIAGNOSIS — E119 Type 2 diabetes mellitus without complications: Secondary | ICD-10-CM | POA: Insufficient documentation

## 2021-11-27 DIAGNOSIS — D860 Sarcoidosis of lung: Secondary | ICD-10-CM | POA: Diagnosis not present

## 2021-11-27 LAB — GLUCOSE, CAPILLARY: Glucose-Capillary: 110 mg/dL — ABNORMAL HIGH (ref 70–99)

## 2021-11-27 NOTE — Progress Notes (Signed)
Daily Session Note ? ?Patient Details  ?Name: Cassandra Burns ?MRN: [id number] ?Date of Birth: 02/26/1966 ?Referring Provider:   ? ?Encounter Date: 11/27/2021 ? ?Check In: ? Session Check In - 11/27/21 1504   ? ?  ? Check-In  ? Supervising physician immediately available to respond to emergencies [facility] immediately available   ? Physician(s) Bonner Puna   ? Location [facility]   ? Staff Present Elmon Else, MS, ACSM-CEP, Exercise Physiologist;Carlette Wilber Oliphant, RN, BSN;Ramon Dredge, RN, MHA;Joan Leonia Reeves, RN, BSN   ? Virtual Visit No   ? Medication changes reported     No   ? Fall or balance concerns reported    No   ? Tobacco Cessation No Change   ? Warm-up and Cool-down Performed as group-led instruction   ? Resistance Training Performed Yes   ? VAD Patient? No   ? PAD/SET Patient? No   ?  ? Pain Assessment  ? Currently in Pain? No/denies   ? Pain Score 0-No pain   ? Multiple Pain Sites No   ? ?  ?  ? ?  ? ? ?Capillary Blood Glucose: ?Results for orders placed or performed during the hospital encounter of 11/27/21 (from the past 24 hour(s))  ?Glucose, capillary     Status: Abnormal  ? Collection Time: 11/27/21  2:20 PM  ?Result Value Ref Range  ? Glucose-Capillary 110 (H) 70 - 99 mg/dL  ? ? ? Exercise Prescription Changes - 11/27/21 1500   ? ?  ? Response to Exercise  ? Blood Pressure (Admit) 130/80   ? Blood Pressure (Exercise) 144/80   ? Blood Pressure (Exit) 114/72   ? Heart Rate (Admit) 102 bpm   ? Heart Rate (Exercise) 120 bpm   ? Heart Rate (Exit) 93 bpm   ? Oxygen Saturation (Admit) 95 %   ? Oxygen Saturation (Exercise) 94 %   ? Oxygen Saturation (Exit) 97 %   ? Rating of Perceived Exertion (Exercise) 12   ? Perceived Dyspnea (Exercise) 3   ? Duration Progress to 30 minutes of  aerobic without signs/symptoms of physical distress   ? Intensity THRR unchanged   ?  ? Progression  ? Progression Continue to progress workloads to maintain intensity without signs/symptoms of  physical distress.   ?  ? Resistance Training  ? Weight red bands   ? Reps 10-15   ? Time 10 Minutes   ?  ? Oxygen  ? Oxygen Continuous   ? Liters 3   ?  ? Recumbant Bike  ? Level 1   ? Minutes 15   ?  ? NuStep  ? Level 1   ? SPM 70   ? Minutes 15   ?  ? Oxygen  ? Maintain Oxygen Saturation 88% or higher   ? ?  ?  ? ?  ? ? ?Social History  ? ?Tobacco Use  ?Smoking Status Former  ? Types: Cigars  ? Quit date: 06/30/2017  ? Years since quitting: 4.4  ?Smokeless Tobacco Never  ?Tobacco Comments  ? Smokes Marijuana ("here and there") (as needed)  ? ? ?Goals Met:  ?Proper associated with RPD/PD & O2 Sat ?Exercise tolerated well ?No report of concerns or symptoms today ?Strength training completed today ? ?Goals Unmet:  ?Not Applicable ? ?Comments: Service time is from 1330 to 1440.  ? ? ?Dr. Rodman Pickle is Medical Director for Pulmonary Rehab at [facility].  ?

## 2021-11-28 LAB — GLUCOSE, CAPILLARY: Glucose-Capillary: 132 mg/dL — ABNORMAL HIGH (ref 70–99)

## 2021-11-29 ENCOUNTER — Telehealth: Payer: Self-pay | Admitting: Pulmonary Disease

## 2021-11-29 ENCOUNTER — Encounter (HOSPITAL_COMMUNITY): Payer: Medicare HMO

## 2021-11-29 ENCOUNTER — Encounter (HOSPITAL_COMMUNITY)
Admission: RE | Admit: 2021-11-29 | Discharge: 2021-11-29 | Disposition: A | Discharge status: Home / Self Care | Payer: Medicare HMO | Source: Ambulatory Visit | Attending: Pulmonary Disease | Admitting: Pulmonary Disease

## 2021-11-29 DIAGNOSIS — D860 Sarcoidosis of lung: Secondary | ICD-10-CM | POA: Diagnosis not present

## 2021-11-29 LAB — GLUCOSE, CAPILLARY
Glucose-Capillary: 159 mg/dL — ABNORMAL HIGH (ref 70–99)
Glucose-Capillary: 116 mg/dL — ABNORMAL HIGH (ref 70–99)

## 2021-11-29 NOTE — Progress Notes (Signed)
Daily Session Note ? ?Patient Details  ?Name: Cassandra Burns ?MRN: [id number] ?Date of Birth: Jul 29, 1966 ?Referring Provider:   ? ?Encounter Date: 11/29/2021 ? ?Check In: ? Session Check In - 11/29/21 1427   ? ?  ? Check-In  ? Supervising physician immediately available to respond to emergencies [facility] immediately available   ? Physician(s) Dr. Karleen Hampshire   ? Location [facility]   ? Staff Present Rosebud Poles, RN, BSN;Carlette Wilber Oliphant, RN, Quentin Ore, MS, ACSM-CEP, Exercise Physiologist;David Makemson, MS, ACSM-CEP, CCRP, Exercise Physiologist   ? Virtual Visit No   ? Medication changes reported     No   ? Fall or balance concerns reported    No   ? Tobacco Cessation No Change   ? Warm-up and Cool-down Performed as group-led instruction   ? Resistance Training Performed Yes   ? VAD Patient? No   ? PAD/SET Patient? No   ?  ? Pain Assessment  ? Currently in Pain? No/denies   ? Multiple Pain Sites No   ? ?  ?  ? ?  ? ? ?Capillary Blood Glucose: ?Results for orders placed or performed during the hospital encounter of 11/29/21 (from the past 24 hour(s))  ?Glucose, capillary     Status: Abnormal  ? Collection Time: 11/29/21  2:15 PM  ?Result Value Ref Range  ? Glucose-Capillary 116 (H) 70 - 99 mg/dL  ? ? ? ? ?Social History  ? ?Tobacco Use  ?Smoking Status Former  ? Types: Cigars  ? Quit date: 06/30/2017  ? Years since quitting: 4.4  ?Smokeless Tobacco Never  ?Tobacco Comments  ? Smokes Marijuana ("here and there") (as needed)  ? ? ?Goals Met:  ?Proper associated with RPD/PD & O2 Sat ?Exercise tolerated well ?No report of concerns or symptoms today ?Strength training completed today ? ?Goals Unmet:  ?Not Applicable ? ?Comments: Service time is from 1330 to 1445.  ? ? ?Dr. Rodman Pickle is Medical Director for Pulmonary Rehab at [facility].  ?

## 2021-11-29 NOTE — Progress Notes (Signed)
Cassandra Burns 56 y.o. female ? ?Cassandra Burns is motivated to make lifestyle changes to aid with cardiac/pulmonary rehab. Patient has medical history of HTN, OSA, asthma, acute on chronic respiratory failure with hypoxia, DM, Sarcoidosis, depression, GAD, gout. She lives alone and does her own grocery shopping and cooking. She has previously seen a registered dietitian through the Diabetes education on 08/27/21.  She recently started mounjaro about 2-3 weeks ago and states blood sugars are running in the 150-170s now. She reports improved portion control.  She is checking blood sugar twice daily.  ? ?Labs: A1c 10.2, eGFR 31, Creat 1.86 ? ?24 Hour Recall:  ?Breakfast: cereal ?Lunch: bologna, cheese roll-up  ?Dinner:Salad ? ?Nutrition Diagnosis ?Morbid Obesity related to excessive energy intake as evidenced by a BMI of 45.86 ?Excessive carbohydrate intake related to consumption of convenience foods and sugary beverages as evidenced by A1C 10.2 ? ?Nutrition Intervention ?Pt?s individual nutrition plan reviewed with pt. ?Benefits of adopting Heart Healthy diet discussed.  ?Continue client-centered nutrition education by RD, as part of interdisciplinary care. ? ?Monitor/Evaluation: ?Patient reports motivation to make lifestyle changes for adherence to heart healthy diet recommendation, blood sugar control, and weight management. She reports that she "doesn't feel like eating".  We discussed benefits of eating small meals on a regular schedule, carbohydrate sources, and increasing fiber intake.  Discussed simple, nutrient dense convenience foods. Handouts/notes given. Patient amicable to RD suggestions and verbalizes understanding. Will follow-up as needed.  ? ?10 minutes spent in review of topics related to a heart healthy diet including sodium intake, blood sugar control, weight management, and fiber intake. ? ?Goal(s) ?Consider benefits of eating on regular schedule; check-in every 3-4 hours to aid with blood sugar  control. Prioritize protein + carbohydrate.  ?Increase fiber intake. Aim for 2-3 servings of fruit and 4-6 servings of vegetables/daily.  ?Eliminate sugary beverages. Prioritize water and other zero calorie drinks.  ?Use the plate method as a guide for meal planning.  ?Pt to identify and limit food sources of saturated fat, trans fat, refined carbohydrates and sodium ?Pt to identify food quantities necessary to achieve weight loss of 6-24 lb at graduation from cardiac rehab.  ?Pt able to name foods that affect blood glucose. Continue to limit simple sugars, refined carbohydrates, sugary beverages, etc.  ?Pt to describe the benefit of including lean protein/plant proteins, fruits, vegetables, whole grains, nuts/seeds, and low-fat dairy products in a heart healthy meal plan. ?Pt to practice mindful and intuitive eating exercises ? ?Plan:  ?Will provide client-centered nutrition education as part of interdisciplinary care ?Monitor and evaluate progress toward nutrition goal with team. ? ? ?Cassandra [place], MS, RDN, LDN ? ?

## 2021-12-03 ENCOUNTER — Ambulatory Visit (INDEPENDENT_AMBULATORY_CARE_PROVIDER_SITE_OTHER): Payer: Medicare HMO | Admitting: Nurse Practitioner

## 2021-12-03 ENCOUNTER — Ambulatory Visit (INDEPENDENT_AMBULATORY_CARE_PROVIDER_SITE_OTHER): Payer: Medicare HMO

## 2021-12-03 ENCOUNTER — Encounter: Payer: Self-pay | Admitting: Nurse Practitioner

## 2021-12-03 VITALS — BP 112/60 | HR 94 | Ht 59.0 in | Wt 227.6 lb

## 2021-12-03 DIAGNOSIS — D860 Sarcoidosis of lung: Secondary | ICD-10-CM

## 2021-12-03 DIAGNOSIS — J45909 Unspecified asthma, uncomplicated: Secondary | ICD-10-CM

## 2021-12-03 DIAGNOSIS — G4733 Obstructive sleep apnea (adult) (pediatric): Secondary | ICD-10-CM

## 2021-12-03 DIAGNOSIS — J4541 Moderate persistent asthma with (acute) exacerbation: Secondary | ICD-10-CM

## 2021-12-03 DIAGNOSIS — J9611 Chronic respiratory failure with hypoxia: Secondary | ICD-10-CM | POA: Diagnosis not present

## 2021-12-03 MED ORDER — PREDNISONE 10 MG PO TABS
ORAL_TABLET | ORAL | 0 refills | Status: DC
Start: 2021-12-03 — End: 2022-09-05

## 2021-12-03 NOTE — Assessment & Plan Note (Signed)
Continue BiPAP therapy 16/12 cmH2O nightly. ?

## 2021-12-03 NOTE — Assessment & Plan Note (Signed)
See above plan. Continue triple therapy with Trelegy and PRN albuterol. ?

## 2021-12-03 NOTE — Progress Notes (Signed)
? ?'@Patient'$  ID: Cassandra Burns, female    DOB: [birth year], 56 y.o.   MRN: [id number] ? ?Chief Complaint  ?Patient presents with  ? Follow-up  ?  Breathing issues, thinks needs pred taper ? ?Needs todays note sent to [facility], also needs to be schedule for 47mn walk per [employer] rep  ? ? ?Referring provider: ?ANolene Ebbs MD ? ?HPI: ?56year old female, former smoker followed for pulmonary sarcoidosis, OSA/OHS on BiPAP, asthma, chronic respiratory failure.  She is a patient of Dr. SJuanetta Goslingand last seen 08/29/2021.  Past medical history significant for hypertension, DM, depression, anxiety, morbid obesity, HLD.  She is followed by Dr. SDossie Derwith rheumatology for systemic sarcoidosis and was previously treated with chronic prednisone. ? ?TEST/EVENTS:  ?May 2015 EBUS: Granulomas ?08/2013 PFT: DLCO 60% ?01/19/2020 HRCT chest: Calcified mediastinal and hilar nodes, patchy retraction BTX with interstitial coarsening and upper/mid lung predominance progress since 2018, air trapping ?11/08/2019 PFT: FEV1 46, FVC 42, ratio 91, TLC 56, DLCOunc 55.  Severe restrictive lung disease with moderately severe diffusion defect.  No significant BD; did have mid flow reversibility ?04/24/2021 echocardiogram: EF 65 to 70%, G1 DD, severe LVH ? ?08/29/2021: OV with Dr. SHalford Chessman  Recently started on prednisone by rheumatology for joint pain/swelling.  Reported that she had stopped smoking marijuana and breathing improved after that.  No acute symptoms are reported.  Does not want to enroll in pulmonary rehab again.  Sleeping okay and using BiPAP 16/12 cmH2O nightly.  Uses 2 L supplemental O2 24/7 ? ?12/03/2021: Today- Acute visit ?Patient presents today for increased shortness of breath over the last few days.  Feels like her activity tolerance is not as great as it normally is.  She has had a minimal cough, which is dry and has not changed much from her baseline.  She did have some swelling in both of her lower extremities which has resolved  after taking her as needed Lasix.  Denies any wheezing, hemoptysis, recent weight loss, PND, orthopnea.  She has not had any increased O2 demand.  Remains on 2 L/min at rest and 3-4 L with activity, which she states is her normal for her.  She continues on Trelegy daily.  Has not been using albuterol very much.  She continues on BiPAP nightly. She does need a 6 min walk per [employer] to replace her POC. ? ?Allergies  ?Allergen Reactions  ? Oxycontin [Oxycodone]   ?  "Too sleepy; had to give me something to knock it out last time I was in hospital"  ? ? ?Immunization History  ?Administered Date(s) Administered  ? Influenza Split 08/01/2008, 04/03/2011, 07/07/2012, 08/29/2012, 07/20/2014  ? Influenza,inj,Quad PF,6+ Mos 08/05/2018  ? Influenza-Unspecified 05/29/2017  ? PFIZER(Purple Top)SARS-COV-2 Vaccination 04/29/2020, 05/30/2020  ? Pneumococcal Conjugate-13 12/06/2010  ? Tdap 12/06/2010  ? ? ?Past Medical History:  ?Diagnosis Date  ? Anxiety   ? Asthma   ? Bone pain 06/07/2013  ? Depression   ? Diabetes mellitus 12/18/2012  ? NIDDM  ? Hernia of abdominal cavity 10/25/2013  ? History of anemia 07/26/2013  ? Hypertension   ? Iron deficiency anemia due to chronic blood loss   ? Lymphadenopathy 06/07/2013  ? Obesity 12/18/2012  ? Psoriasis   ? Sarcoidosis 07/26/2013  ? Sleep apnea   ? uses bipap  ? ? ?Tobacco History: ?Social History  ? ?Tobacco Use  ?Smoking Status Former  ? Types: Cigars  ? Quit date: 06/30/2017  ? Years since quitting: 4.4  ?Smokeless Tobacco Never  ?  Tobacco Comments  ? Smokes Marijuana ("here and there") (as needed)  ? ?Counseling given: Not Answered ?Tobacco comments: Smokes Marijuana ("here and there") (as needed) ? ? ?Outpatient Medications Prior to Visit  ?Medication Sig Dispense Refill  ? acetaminophen (TYLENOL) 500 MG tablet Take 1 tablet (500 mg total) by mouth every 6 (six) hours as needed. 30 tablet 0  ? albuterol (PROVENTIL) (2.5 MG/3ML) 0.083% nebulizer solution Take 3 mLs (2.5 mg total) by  nebulization every 6 (six) hours as needed for wheezing or shortness of breath. 75 mL 12  ? albuterol (VENTOLIN HFA) 108 (90 Base) MCG/ACT inhaler Inhale 1-2 puffs into the lungs every 6 (six) hours as needed for wheezing or shortness of breath. 18 g 6  ? allopurinol (ZYLOPRIM) 100 MG tablet Take 100 mg by mouth 2 (two) times daily.    ? amLODipine (NORVASC) 10 MG tablet Take 1 tablet (10 mg total) by mouth daily after lunch. 30 tablet 3  ? FLUoxetine (PROZAC) 40 MG capsule Take 40 mg by mouth daily.    ? fluticasone (FLONASE) 50 MCG/ACT nasal spray Place 1 spray into both nostrils daily as needed for allergies or rhinitis.    ? Fluticasone-Umeclidin-Vilant (TRELEGY ELLIPTA) 200-62.5-25 MCG/ACT AEPB Inhale 1 puff into the lungs daily.    ? furosemide (LASIX) 20 MG tablet Take 1 tablet (20 mg total) by mouth daily. (Patient taking differently: Take 20 mg by mouth daily as needed for edema.) 90 tablet 0  ? gabapentin (NEURONTIN) 800 MG tablet Take 800 mg by mouth 3 (three) times daily.    ? labetalol (NORMODYNE) 100 MG tablet Take 100 mg by mouth daily.    ? losartan-hydrochlorothiazide (HYZAAR) 100-25 MG tablet Take 1 tablet by mouth daily after lunch.    ? MOUNJARO 2.5 MG/0.5ML Pen Inject into the skin.    ? OXYGEN Inhale 2-3 L into the lungs continuous.    ? rosuvastatin (CRESTOR) 40 MG tablet Take 1 tablet (40 mg total) by mouth daily. 90 tablet 3  ? SYNJARDY XR 25-1000 MG TB24 Take 1 tablet by mouth daily.    ? TOUJEO SOLOSTAR 300 UNIT/ML Solostar Pen Inject 40 Units into the skin 2 (two) times daily after a meal. (Patient taking differently: Inject 60 Units into the skin 2 (two) times daily after a meal.)    ? Vitamin D, Ergocalciferol, (DRISDOL) 1.25 MG (50000 UNIT) CAPS capsule Take 50,000 Units by mouth every Monday.    ? budesonide-formoterol (SYMBICORT) 160-4.5 MCG/ACT inhaler Inhale 2 puffs into the lungs 2 (two) times daily. 1 Inhaler 6  ? ?No facility-administered medications prior to visit.   ? ? ? ?Review of Systems:  ? ?Constitutional: No weight loss or gain, night sweats, fevers, chills, fatigue, or lassitude. ?HEENT: No headaches, difficulty swallowing, tooth/dental problems, or sore throat. No sneezing, itching, ear ache, nasal congestion, or post nasal drip ?CV:  +swelling in lower extremities (resolved after lasix course). No chest pain, orthopnea, PND, anasarca, dizziness, palpitations, syncope ?Resp: +shortness of breath with exertion; minimal dry cough. No excess mucus or change in color of mucus. No productive or non-productive. No hemoptysis. No wheezing.  No chest wall deformity ?GI:  No heartburn, indigestion, abdominal pain, nausea, vomiting, diarrhea, change in bowel habits, loss of appetite, bloody stools.  ?Skin: No rash, lesions, ulcerations ?MSK:  No joint pain or swelling.  No decreased range of motion.  No back pain. ?Neuro: No dizziness or lightheadedness.  ?Psych: No depression or anxiety. Mood stable.  ? ? ? ?  Physical Exam: ? ?BP 112/60 (BP Location: Right Arm, Cuff Size: Large)   Pulse 94   Ht '4\' 11"'$  (1.499 m)   Wt 227 lb 9.6 oz (103.2 kg)   LMP 06/13/2012   SpO2 93%   BMI 45.97 kg/m?  ? ?GEN: Pleasant, interactive, chronically-ill appearing; obese; in no acute distress. ?HEENT:  Normocephalic and atraumatic. PERRLA. Sclera white. Nasal turbinates pink, moist and patent bilaterally. No rhinorrhea present. Oropharynx pink and moist, without exudate or edema. No lesions, ulcerations, or postnasal drip.  ?NECK:  Supple w/ fair ROM. No JVD present. Normal carotid impulses w/o bruits. Thyroid symmetrical with no goiter or nodules palpated. No lymphadenopathy.   ?CV: RRR, no m/r/g, no peripheral edema. Pulses intact, +2 bilaterally. No cyanosis, pallor or clubbing. ?PULMONARY:  Unlabored, regular breathing. Diminished bilaterally w/o wheezes/rales/rhonchi. No accessory muscle use. No dullness to percussion. ?GI: BS present and normoactive. Soft, non-tender to palpation. No  organomegaly or masses detected. No CVA tenderness. ?MSK: No erythema, warmth or tenderness. Cap refil <2 sec all extrem. No deformities or joint swelling noted.  ?Neuro: A/Ox3. No focal deficits noted.   ?Skin: Warm, no lesions

## 2021-12-03 NOTE — Patient Instructions (Addendum)
Continue Trelegy 1 puff daily. Brush tongue and rinse mouth afterwards ?Continue Albuterol inhaler 2 puffs or 3 mL neb every 6 hours as needed for shortness of breath or wheezing. Notify if symptoms persist despite rescue inhaler/neb use. ?Continue flonase 1 spray each nostril daily as needed for nasal congestion/drainage ?Continue supplemental oxygen 2 lpm at rest and 3-4 lpm POC with activity. Goal oxygen level >88-90%  ? ?Prednisone taper. 4 tabs for 2 days, then 3 tabs for 2 days, 2 tabs for 2 days, then 1 tab for 2 days, then stop. Take in AM with food ?Mucinex 600 mg Twice daily for chest congestion/cough ? ?Chest x ray today. We will notify you of any abnormal results.  ? ?Schedule 6 minute walk today ? ?Follow up in one month with Dr. Halford Chessman or Alanson Aly. If symptoms do not improve or worsen, please contact office for sooner follow up or seek emergency care. ?

## 2021-12-03 NOTE — Assessment & Plan Note (Signed)
Possible sarcoid flare vs asthma flare. Not currently on any daily prednisone as rheumatology took her off. Will tx with pred taper. CXR to evaluate for superimposed infection.  ? ?Patient Instructions  ?Continue Trelegy 1 puff daily. Brush tongue and rinse mouth afterwards ?Continue Albuterol inhaler 2 puffs or 3 mL neb every 6 hours as needed for shortness of breath or wheezing. Notify if symptoms persist despite rescue inhaler/neb use. ?Continue flonase 1 spray each nostril daily as needed for nasal congestion/drainage ?Continue supplemental oxygen 2 lpm at rest and 3-4 lpm POC with activity. Goal oxygen level >88-90%  ? ?Prednisone taper. 4 tabs for 2 days, then 3 tabs for 2 days, 2 tabs for 2 days, then 1 tab for 2 days, then stop. Take in AM with food ?Mucinex 600 mg Twice daily for chest congestion/cough ? ?Chest x ray today. We will notify you of any abnormal results.  ? ?Schedule 6 minute walk today ? ?Follow up in one month with Dr. Halford Chessman or Alanson Aly. If symptoms do not improve or worsen, please contact office for sooner follow up or seek emergency care. ? ? ?

## 2021-12-03 NOTE — Progress Notes (Signed)
Reviewed and agree with assessment/plan. ? ? ?Sharyn Brilliant, MD ?[facility] ?12/03/2021, 7:08 PM ?Pager:  [phone] ? ?

## 2021-12-03 NOTE — Assessment & Plan Note (Signed)
Stable; no increased O2 demand per pt. Pt wants higher level POC (currently has the 3 lpm) so needs a 6 min walk per [employer]. Will schedule for a few weeks from now; need acute symptoms to be resolved. In interim, continue 3 lpm POC with activity and 2 lpm at rest. Goal SpO2 >88-90%.  ?

## 2021-12-03 NOTE — Progress Notes (Signed)
Please notify patient CXR was stable with chronic interstitial thickening, consistent with known sarcoid. No acute process. Thanks.

## 2021-12-03 NOTE — Telephone Encounter (Signed)
Closing this bc unfortunately no phone number to return call to was obtained  ?Will d/w pt at ov ?

## 2021-12-04 ENCOUNTER — Other Ambulatory Visit: Payer: Self-pay | Admitting: Internal Medicine

## 2021-12-04 ENCOUNTER — Encounter (HOSPITAL_COMMUNITY): Payer: Medicare HMO

## 2021-12-04 ENCOUNTER — Encounter (HOSPITAL_COMMUNITY)
Admission: RE | Admit: 2021-12-04 | Discharge: 2021-12-04 | Disposition: A | Discharge status: Home / Self Care | Payer: Medicare HMO | Source: Ambulatory Visit | Attending: Pulmonary Disease | Admitting: Pulmonary Disease

## 2021-12-04 VITALS — Wt 227.1 lb

## 2021-12-04 DIAGNOSIS — D860 Sarcoidosis of lung: Secondary | ICD-10-CM

## 2021-12-04 NOTE — Progress Notes (Signed)
Daily Session Note ? ?Patient Details  ?Name: Cassandra Burns ?MRN: [id number] ?Date of Birth: 04/18/1966 ?Referring Provider:   ? ?Encounter Date: 12/04/2021 ? ?Check In: ? Session Check In - 12/04/21 1454   ? ?  ? Check-In  ? Supervising physician immediately available to respond to emergencies [facility] immediately available   ? Physician(s) Dr. Akula   ? Location [facility]   ? Staff Present Joan Behrens, RN, BSN;Kaylee Davis, MS, ACSM-CEP, Exercise Physiologist;David Makemson, MS, ACSM-CEP, CCRP, Exercise Physiologist;Carlette Carlton, RN, BSN   ? Virtual Visit No   ? Medication changes reported     No   ? Fall or balance concerns reported    No   ? Tobacco Cessation No Change   ? Warm-up and Cool-down Performed as group-led instruction   ? Resistance Training Performed Yes   ? VAD Patient? No   ? PAD/SET Patient? No   ?  ? Pain Assessment  ? Currently in Pain? No/denies   ? Pain Score 0-No pain   ? Multiple Pain Sites No   ? ?  ?  ? ?  ? ? ?Capillary Blood Glucose: ?No results found for this or any previous visit (from the past 24 hour(s)). ? ? ? ?Social History  ? ?Tobacco Use  ?Smoking Status Former  ? Types: Cigars  ? Quit date: 06/30/2017  ? Years since quitting: 4.4  ?Smokeless Tobacco Never  ?Tobacco Comments  ? Smokes Marijuana ("here and there") (as needed)  ? ? ?Goals Met:  ?Proper associated with RPD/PD & O2 Sat ?Exercise tolerated well ?No report of concerns or symptoms today ?Strength training completed today ? ?Goals Unmet:  ?Not Applicable ? ?Comments: Service time is from 1316 to 1436 ? ? ? ?Dr. Jane Ellison is Medical Director for Pulmonary Rehab at [facility].  ?

## 2021-12-05 ENCOUNTER — Telehealth: Payer: Self-pay | Admitting: Pulmonary Disease

## 2021-12-05 DIAGNOSIS — J9611 Chronic respiratory failure with hypoxia: Secondary | ICD-10-CM

## 2021-12-06 ENCOUNTER — Encounter (HOSPITAL_COMMUNITY): Payer: Medicare HMO

## 2021-12-06 NOTE — Telephone Encounter (Signed)
Routing to [facility] for f/u  ?

## 2021-12-07 LAB — C. TRACHOMATIS/N. GONORRHOEAE RNA
C. trachomatis RNA, TMA: NOT DETECTED
N. gonorrhoeae RNA, TMA: NOT DETECTED

## 2021-12-07 LAB — URINE CULTURE
MICRO NUMBER:: 13371364
SPECIMEN QUALITY:: ADEQUATE

## 2021-12-07 NOTE — Telephone Encounter (Signed)
Have you seen anything on this patient? °

## 2021-12-07 NOTE — Telephone Encounter (Signed)
I have not received anything on this patient.

## 2021-12-07 NOTE — Telephone Encounter (Signed)
I called and spoke with Cassandra Burns and she needed a whole new order for this patient. I have placed a new order for oxygen.  ?

## 2021-12-11 ENCOUNTER — Encounter (HOSPITAL_COMMUNITY): Payer: Medicare HMO

## 2021-12-12 ENCOUNTER — Telehealth (HOSPITAL_COMMUNITY): Payer: Self-pay | Admitting: *Deleted

## 2021-12-12 NOTE — Telephone Encounter (Signed)
Called to check on pt since she has had recent absences. She states that she felt "beat down" on Tuesday 5/11 and Thursday 5/16 and was unable to attend. I discussed with her that we were unable to extend visits due to having others come in from her graduation date. She voices understanding and plans to return 5/18. ?

## 2021-12-13 ENCOUNTER — Encounter (HOSPITAL_COMMUNITY): Payer: Medicare HMO

## 2021-12-13 ENCOUNTER — Encounter (HOSPITAL_COMMUNITY)
Admission: RE | Admit: 2021-12-13 | Discharge: 2021-12-13 | Disposition: A | Discharge status: Home / Self Care | Payer: Medicare HMO | Source: Ambulatory Visit | Attending: Pulmonary Disease | Admitting: Pulmonary Disease

## 2021-12-13 DIAGNOSIS — D860 Sarcoidosis of lung: Secondary | ICD-10-CM | POA: Diagnosis not present

## 2021-12-13 NOTE — Progress Notes (Signed)
Daily Session Note  Patient Details  Name: Cassandra Burns MRN: [id number] Date of Birth: Nov 12, 1965 Referring Provider:    Encounter Date: 12/13/2021  Check In:  Session Check In - 12/13/21 1426       Check-In   Supervising physician immediately available to respond to emergencies [facility] immediately available    Physician(s) Dr. Tawanna Solo    Location [facility]    Staff Present Rosebud Poles, RN, BSN;Lisa Ysidro Evert, Cathleen Fears, MS, ACSM-CEP, Exercise Physiologist    Virtual Visit No    Medication changes reported     No    Fall or balance concerns reported    No    Tobacco Cessation No Change    Warm-up and Cool-down Performed as group-led instruction    Resistance Training Performed Yes    VAD Patient? No    PAD/SET Patient? No      Pain Assessment   Currently in Pain? No/denies    Multiple Pain Sites No             Capillary Blood Glucose: No results found for this or any previous visit (from the past 24 hour(s)).    Social History   Tobacco Use  Smoking Status Former   Types: Cigars   Quit date: 06/30/2017   Years since quitting: 4.4  Smokeless Tobacco Never  Tobacco Comments   Smokes Marijuana ("here and there") (as needed)    Goals Met:  Proper associated with RPD/PD & O2 Sat Exercise tolerated well No report of concerns or symptoms today Strength training completed today  Goals Unmet:  Not Applicable  Comments: Service time is from 1320 to [place]    Dr. Rodman Pickle is Medical Director for Pulmonary Rehab at [facility].

## 2021-12-13 NOTE — Telephone Encounter (Signed)
Pt had recent OV with [facility] 5/8 and it was stated that pt wanted to switch DMEs. Due to this, new order has been placed. Nothing further needed.

## 2021-12-18 ENCOUNTER — Encounter (HOSPITAL_COMMUNITY): Payer: Medicare HMO

## 2021-12-18 ENCOUNTER — Encounter (HOSPITAL_COMMUNITY)
Admission: RE | Admit: 2021-12-18 | Discharge: 2021-12-18 | Disposition: A | Discharge status: Home / Self Care | Payer: Medicare HMO | Source: Ambulatory Visit | Attending: Pulmonary Disease | Admitting: Pulmonary Disease

## 2021-12-18 DIAGNOSIS — D860 Sarcoidosis of lung: Secondary | ICD-10-CM

## 2021-12-18 NOTE — Progress Notes (Signed)
Daily Session Note  Patient Details  Name: NYLEAH MCGINNIS MRN: [id number] Date of Birth: [birth year] Referring Provider:    Encounter Date: 12/18/2021  Check In:  Session Check In - 12/18/21 1413       Check-In   Supervising physician immediately available to respond to emergencies [facility] immediately available    Physician(s) Dr. Tawanna Solo    Location [facility]    Staff Present Rosebud Poles, RN, BSN;Lisa Ysidro Evert, Cathleen Fears, MS, ACSM-CEP, Exercise Physiologist;Olinty Celesta Aver, MS, ACSM CEP, Exercise Physiologist    Virtual Visit No    Medication changes reported     No    Fall or balance concerns reported    No    Tobacco Cessation No Change    Warm-up and Cool-down Performed as group-led instruction    Resistance Training Performed Yes    VAD Patient? No    PAD/SET Patient? No      Pain Assessment   Currently in Pain? No/denies    Pain Score 0-No pain    Multiple Pain Sites No             Capillary Blood Glucose: No results found for this or any previous visit (from the past 24 hour(s)).    Social History   Tobacco Use  Smoking Status Former   Types: Cigars   Quit date: 06/30/2017   Years since quitting: 4.4  Smokeless Tobacco Never  Tobacco Comments   Smokes Marijuana ("here and there") (as needed)    Goals Met:  Proper associated with RPD/PD & O2 Sat Exercise tolerated well No report of concerns or symptoms today Strength training completed today  Goals Unmet:  Not Applicable  Comments: Service time is from [place] to [place]    Dr. Rodman Pickle is Medical Director for Pulmonary Rehab at [facility].

## 2021-12-19 NOTE — Progress Notes (Cosign Needed Addendum)
Pulmonary Individual Treatment Plan  Patient Details  Name: Cassandra Burns MRN: [id number] Date of Birth: 1966/06/18 Referring Provider:  Dr. 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[place])  Patient's Home Medications on Admission:   Current Outpatient Medications:    acetaminophen (TYLENOL) 500 MG tablet, Take 1 tablet (500 mg total) by mouth every 6 (six) hours as needed., Disp: 30 tablet, Rfl: 0   albuterol (PROVENTIL) (2.5 MG/3ML) 0.083% nebulizer solution, Take 3 mLs (2.5 mg total) by nebulization every 6 (six) hours as needed for wheezing or shortness of breath., Disp: 75 mL, Rfl: 12   albuterol (VENTOLIN HFA) 108 (90 Base) MCG/ACT inhaler, Inhale 1-2 puffs into the lungs every 6 (six) hours as needed for wheezing or shortness of breath., Disp: 18 g, Rfl: 6   allopurinol (ZYLOPRIM) 100 MG tablet, Take 100 mg by mouth 2 (two) times daily., Disp: , Rfl:    amLODipine (NORVASC) 10 MG tablet, Take 1 tablet (10 mg total) by mouth daily after lunch., Disp: 30 tablet, Rfl: 3   FLUoxetine (PROZAC) 40 MG capsule, Take 40 mg by mouth daily., Disp: , Rfl:    fluticasone (FLONASE) 50 MCG/ACT nasal spray, Place 1 spray into both nostrils daily as needed for allergies or rhinitis., Disp: , Rfl:    Fluticasone-Umeclidin-Vilant (TRELEGY ELLIPTA) 200-62.5-25 MCG/ACT AEPB, Inhale 1 puff into the lungs daily., Disp: , Rfl:    furosemide (LASIX) 20 MG tablet, Take 1 tablet (20 mg total) by mouth daily. (Patient taking differently: Take 20 mg by mouth daily as needed for edema.), Disp: 90 tablet, Rfl: 0   gabapentin (NEURONTIN) 800 MG tablet, Take 800 mg by mouth 3 (three) times daily., Disp: , Rfl:    labetalol (NORMODYNE) 100 MG tablet, Take 100 mg by mouth daily., Disp: , Rfl:    losartan-hydrochlorothiazide (HYZAAR) 100-25 MG tablet, Take 1 tablet by mouth daily after lunch., Disp: , Rfl:    MOUNJARO 2.5 MG/0.5ML Pen, Inject into the skin., Disp: , Rfl:     OXYGEN, Inhale 2-3 L into the lungs continuous., Disp: , Rfl:    predniSONE (DELTASONE) 10 MG tablet, 4 tabs for 2 days, then 3 tabs for 2 days, 2 tabs for 2 days, then 1 tab for 2 days, then stop, Disp: 20 tablet, Rfl: 0   rosuvastatin (CRESTOR) 40 MG tablet, Take 1 tablet (40 mg total) by mouth daily., Disp: 90 tablet, Rfl: 3   SYNJARDY XR 25-1000 MG TB24, Take 1 tablet by mouth daily., Disp: , Rfl:    TOUJEO SOLOSTAR 300 UNIT/ML Solostar Pen, Inject 40 Units into the skin 2 (two) times daily after a meal. (Patient taking differently: Inject 60 Units into the skin 2 (two) times daily after a meal.), Disp: , Rfl:    Vitamin D, Ergocalciferol, (DRISDOL) 1.25 MG (50000 UNIT) CAPS capsule, Take 50,000 Units by mouth every Monday., Disp: , Rfl:   Past Medical History: Past Medical History:  Diagnosis Date   Anxiety    Asthma    Bone pain 06/07/2013   Depression    Diabetes mellitus 12/18/2012   NIDDM   Hernia of abdominal cavity 10/25/2013   History of anemia 07/26/2013   Hypertension    Iron deficiency anemia due to chronic blood loss    Lymphadenopathy 06/07/2013   Obesity 12/18/2012   Psoriasis    Sarcoidosis 07/26/2013   Sleep apnea    uses bipap    Tobacco Use: Social History   Tobacco Use  Smoking Status Former   Types: Cigars   Quit date: 06/30/2017   Years since quitting: 4.4  Smokeless Tobacco Never  Tobacco Comments   Smokes Marijuana ("here and there") (as needed)    Labs: Review Flowsheet        Latest Ref Rng & Units 07/12/2014 03/23/2021 04/23/2021 09/27/2021  Labs for ITP Cardiac and Pulmonary Rehab  Cholestrol <200 mg/dL  258      LDL (calc) mg/dL (calc)  182      HDL-C > OR = 50 mg/dL  39      Trlycerides <150 mg/dL  206      Hemoglobin A1c 4.8 - 5.6 % 10.0    8.1   10.2    PH, Arterial 7.35 - 7.45      PCO2 arterial 32 - 48 mmHg      Bicarbonate 20.0 - 28.0 mmol/L    24.2    Acid-base deficit 0.0 - 2.0 mmol/L    0.5    O2 Saturation %    88        09/29/2021  Labs for ITP Cardiac and Pulmonary Rehab  Cholestrol   LDL (calc)   HDL-C   Trlycerides   Hemoglobin A1c   PH, Arterial 7.3    PCO2 arterial 55    Bicarbonate 27.1    Acid-base deficit 0.1    O2 Saturation 94.6            Capillary Blood Glucose: Lab Results  Component Value Date   GLUCAP 116 (H) 11/29/2021   GLUCAP 159 (H) 11/29/2021   GLUCAP 110 (H) 11/27/2021   GLUCAP 132 (H) 11/27/2021   GLUCAP 256 (H) 09/30/2021    POCT Glucose     Row Name 12/11/21 1554             POCT Blood Glucose   Pre-Exercise 192 mg/dL       Post-Exercise 157 mg/dL                Pulmonary Assessment Scores:  Pulmonary Assessment Scores     Row Name 11/14/21 1440         ADL UCSD   ADL Phase Entry     SOB Score total 64       CAT Score   CAT Score 19       mMRC Score   mMRC Score 4             UCSD: Self-administered rating of dyspnea associated with activities of daily living (ADLs) 6-point scale (0 = "not at all" to 5 = "maximal or unable to do because of breathlessness")  Scoring Scores range from 0 to 120.  Minimally important difference is 5 units  CAT: CAT can identify the health impairment of COPD patients and is better correlated with disease progression.  CAT has a scoring range of zero to 40. The CAT score is classified into four groups of low (less than 10), medium (10 - 20), high (21-30) and very high (31-40) based on the impact level of disease on health status. A CAT score over 10 suggests significant symptoms.  A worsening CAT score could be explained by an exacerbation, poor medication adherence, poor inhaler technique, or progression of COPD or comorbid conditions.  CAT MCID is 2 points  mMRC: mMRC (Modified Medical Research Council) Dyspnea Scale is used to assess the degree of baseline functional disability in patients of respiratory disease due to dyspnea. No minimal important difference is established. A  decrease in score of 1  point or greater is considered a positive change.   Pulmonary Function Assessment:  Pulmonary Function Assessment - 11/14/21 1229       Breath   Bilateral Breath Sounds Decreased    Shortness of Breath Yes;Limiting activity             Exercise Target Goals: Exercise Program Goal: Individual exercise prescription set using results from initial 6 min walk test and THRR while considering  patient's activity barriers and safety.   Exercise Prescription Goal: Initial exercise prescription builds to 30-45 minutes a day of aerobic activity, 2-3 days per week.  Home exercise guidelines will be given to patient during program as part of exercise prescription that the participant will acknowledge.  Activity Barriers & Risk Stratification:  Activity Barriers & Cardiac Risk Stratification - 11/14/21 1224       Activity Barriers & Cardiac Risk Stratification   Activity Barriers Arthritis;Back Problems;Joint Problems;Deconditioning;Muscular Weakness;Shortness of Breath    Cardiac Risk Stratification Moderate             6 Minute Walk:  6 Minute Walk     Row Name 11/14/21 1216         6 Minute Walk   Phase Initial     Distance 800 feet     Walk Time 6 minutes     # of Rest Breaks 1  2:45-3:10     MPH 1.52     METS 4.37     RPE 11     Perceived Dyspnea  2     VO2 Peak 15.31     Symptoms No     Resting HR 110 bpm     Resting BP 110/70     Resting Oxygen Saturation  93 %     Exercise Oxygen Saturation  during 6 min walk 88 %     Max Ex. HR 145 bpm     Max Ex. BP 148/70     2 Minute Post BP 128/70       Interval HR   1 Minute HR 124     2 Minute HR 130     3 Minute HR 128     4 Minute HR 132     5 Minute HR 141     6 Minute HR 145     2 Minute Post HR 109     Interval Heart Rate? Yes       Interval Oxygen   Interval Oxygen? Yes     Baseline Oxygen Saturation % 93 %     1 Minute Oxygen Saturation % 93 %     1 Minute Liters of Oxygen 3 L     2 Minute Oxygen  Saturation % 89 %     2 Minute Liters of Oxygen 3 L     3 Minute Oxygen Saturation % 88 %     3 Minute Liters of Oxygen 3 L     4 Minute Oxygen Saturation % 90 %     4 Minute Liters of Oxygen 3 L     5 Minute Oxygen Saturation % 88 %     5 Minute Liters of Oxygen 3 L     6 Minute Oxygen Saturation % 88 %     6 Minute Liters of Oxygen 3 L     2 Minute Post Oxygen Saturation % 98 %     2 Minute Post Liters of Oxygen 3 L  Oxygen Initial Assessment:  Oxygen Initial Assessment - 11/14/21 1228       Initial 6 min Walk   Oxygen Used Continuous    Liters per minute 3      Program Oxygen Prescription   Program Oxygen Prescription Continuous      Intervention   Short Term Goals To learn and exhibit compliance with exercise, home and travel O2 prescription;To learn and understand importance of monitoring SPO2 with pulse oximeter and demonstrate accurate use of the pulse oximeter.;To learn and understand importance of maintaining oxygen saturations>88%;To learn and demonstrate proper pursed lip breathing techniques or other breathing techniques. ;To learn and demonstrate proper use of respiratory medications    Long  Term Goals Exhibits compliance with exercise, home  and travel O2 prescription;Verbalizes importance of monitoring SPO2 with pulse oximeter and return demonstration;Maintenance of O2 saturations>88%;Exhibits proper breathing techniques, such as pursed lip breathing or other method taught during program session;Compliance with respiratory medication;Demonstrates proper use of MDI's             Oxygen Re-Evaluation:  Oxygen Re-Evaluation     Row Name 11/20/21 0808 12/13/21 1549           Program Oxygen Prescription   Program Oxygen Prescription Continuous Continuous      Liters per minute 3 3        Home Oxygen   Home Oxygen Device Home Concentrator;E-Tanks Home Concentrator;E-Tanks      Sleep Oxygen Prescription BiPAP BiPAP      Liters per minute 2 2       Home Exercise Oxygen Prescription Continuous Continuous      Liters per minute 3 3      Home Resting Oxygen Prescription Continuous Continuous      Liters per minute 2 2      Compliance with Home Oxygen Use Yes Yes        Goals/Expected Outcomes   Short Term Goals To learn and exhibit compliance with exercise, home and travel O2 prescription;To learn and understand importance of monitoring SPO2 with pulse oximeter and demonstrate accurate use of the pulse oximeter.;To learn and understand importance of maintaining oxygen saturations>88%;To learn and demonstrate proper pursed lip breathing techniques or other breathing techniques. ;To learn and demonstrate proper use of respiratory medications To learn and exhibit compliance with exercise, home and travel O2 prescription;To learn and understand importance of monitoring SPO2 with pulse oximeter and demonstrate accurate use of the pulse oximeter.;To learn and understand importance of maintaining oxygen saturations>88%;To learn and demonstrate proper pursed lip breathing techniques or other breathing techniques. ;To learn and demonstrate proper use of respiratory medications      Long  Term Goals Exhibits compliance with exercise, home  and travel O2 prescription;Verbalizes importance of monitoring SPO2 with pulse oximeter and return demonstration;Maintenance of O2 saturations>88%;Exhibits proper breathing techniques, such as pursed lip breathing or other method taught during program session;Compliance with respiratory medication;Demonstrates proper use of MDI's Exhibits compliance with exercise, home  and travel O2 prescription;Verbalizes importance of monitoring SPO2 with pulse oximeter and return demonstration;Maintenance of O2 saturations>88%;Exhibits proper breathing techniques, such as pursed lip breathing or other method taught during program session;Compliance with respiratory medication;Demonstrates proper use of MDI's      Goals/Expected  Outcomes Compliance and understanding of oxygen saturation monitoring and breathing techniques to decrease shortness of breath. Compliance and understanding of oxygen saturation monitoring and breathing techniques to decrease shortness of breath.  Oxygen Discharge (Final Oxygen Re-Evaluation):  Oxygen Re-Evaluation - 12/13/21 1549       Program Oxygen Prescription   Program Oxygen Prescription Continuous    Liters per minute 3      Home Oxygen   Home Oxygen Device Home Concentrator;E-Tanks    Sleep Oxygen Prescription BiPAP    Liters per minute 2    Home Exercise Oxygen Prescription Continuous    Liters per minute 3    Home Resting Oxygen Prescription Continuous    Liters per minute 2    Compliance with Home Oxygen Use Yes      Goals/Expected Outcomes   Short Term Goals To learn and exhibit compliance with exercise, home and travel O2 prescription;To learn and understand importance of monitoring SPO2 with pulse oximeter and demonstrate accurate use of the pulse oximeter.;To learn and understand importance of maintaining oxygen saturations>88%;To learn and demonstrate proper pursed lip breathing techniques or other breathing techniques. ;To learn and demonstrate proper use of respiratory medications    Long  Term Goals Exhibits compliance with exercise, home  and travel O2 prescription;Verbalizes importance of monitoring SPO2 with pulse oximeter and return demonstration;Maintenance of O2 saturations>88%;Exhibits proper breathing techniques, such as pursed lip breathing or other method taught during program session;Compliance with respiratory medication;Demonstrates proper use of MDI's    Goals/Expected Outcomes Compliance and understanding of oxygen saturation monitoring and breathing techniques to decrease shortness of breath.             Initial Exercise Prescription:   Perform Capillary Blood Glucose checks as needed.  Exercise Prescription Changes:    Exercise Prescription Changes     Row Name 11/27/21 1500 12/11/21 1500           Response to Exercise   Blood Pressure (Admit) 130/80 130/76      Blood Pressure (Exercise) 144/80 --      Blood Pressure (Exit) 114/72 116/64      Heart Rate (Admit) 102 bpm 102 bpm      Heart Rate (Exercise) 120 bpm 118 bpm      Heart Rate (Exit) 93 bpm 101 bpm      Oxygen Saturation (Admit) 95 % 90 %      Oxygen Saturation (Exercise) 94 % 92 %      Oxygen Saturation (Exit) 97 % 92 %      Rating of Perceived Exertion (Exercise) 12 10      Perceived Dyspnea (Exercise) 3 2      Duration Progress to 30 minutes of  aerobic without signs/symptoms of physical distress Progress to 30 minutes of  aerobic without signs/symptoms of physical distress      Intensity THRR unchanged THRR unchanged        Progression   Progression Continue to progress workloads to maintain intensity without signs/symptoms of physical distress. Continue to progress workloads to maintain intensity without signs/symptoms of physical distress.        Resistance Training   Weight red bands Red bands      Reps 10-15 10-15      Time 10 Minutes 10 Minutes        Oxygen   Oxygen Continuous Continuous      Liters 3 3        Recumbant Bike   Level 1 --      Minutes 15 --        NuStep   Level 1 2      SPM 70 70      Minutes  15 15      METs -- 1.9        Arm/Foot Ergometer   Level -- 1      Minutes -- 15      METs -- 1.6        Oxygen   Maintain Oxygen Saturation 88% or higher --               Exercise Comments:   Exercise Comments     Row Name 11/27/21 1526           Exercise Comments Pt completed first day of exercise. She exercised for 15 min on the recumbent bike and Nustep. METs and/or watts were not seen. She performed the warmup and cooldown mostly standing and sat when she was short of breath.                Exercise Goals and Review:   Exercise Goals     Row Name 11/14/21 1226 11/20/21 0806  12/13/21 1549         Exercise Goals   Increase Physical Activity Yes Yes Yes     Intervention Provide advice, education, support and counseling about physical activity/exercise needs.;Develop an individualized exercise prescription for aerobic and resistive training based on initial evaluation findings, risk stratification, comorbidities and participant's personal goals. Provide advice, education, support and counseling about physical activity/exercise needs.;Develop an individualized exercise prescription for aerobic and resistive training based on initial evaluation findings, risk stratification, comorbidities and participant's personal goals. Provide advice, education, support and counseling about physical activity/exercise needs.;Develop an individualized exercise prescription for aerobic and resistive training based on initial evaluation findings, risk stratification, comorbidities and participant's personal goals.     Expected Outcomes Short Term: Attend rehab on a regular basis to increase amount of physical activity.;Long Term: Add in home exercise to make exercise part of routine and to increase amount of physical activity.;Long Term: Exercising regularly at least 3-5 days a week. Short Term: Attend rehab on a regular basis to increase amount of physical activity.;Long Term: Add in home exercise to make exercise part of routine and to increase amount of physical activity.;Long Term: Exercising regularly at least 3-5 days a week. Short Term: Attend rehab on a regular basis to increase amount of physical activity.;Long Term: Add in home exercise to make exercise part of routine and to increase amount of physical activity.;Long Term: Exercising regularly at least 3-5 days a week.     Increase Strength and Stamina Yes Yes Yes     Intervention Provide advice, education, support and counseling about physical activity/exercise needs.;Develop an individualized exercise prescription for aerobic and  resistive training based on initial evaluation findings, risk stratification, comorbidities and participant's personal goals. Provide advice, education, support and counseling about physical activity/exercise needs.;Develop an individualized exercise prescription for aerobic and resistive training based on initial evaluation findings, risk stratification, comorbidities and participant's personal goals. Provide advice, education, support and counseling about physical activity/exercise needs.;Develop an individualized exercise prescription for aerobic and resistive training based on initial evaluation findings, risk stratification, comorbidities and participant's personal goals.     Expected Outcomes Short Term: Increase workloads from initial exercise prescription for resistance, speed, and METs.;Short Term: Perform resistance training exercises routinely during rehab and add in resistance training at home;Long Term: Improve cardiorespiratory fitness, muscular endurance and strength as measured by increased METs and functional capacity (6MWT) Short Term: Increase workloads from initial exercise prescription for resistance, speed, and METs.;Short Term: Perform resistance training exercises routinely during rehab and  add in resistance training at home;Long Term: Improve cardiorespiratory fitness, muscular endurance and strength as measured by increased METs and functional capacity (6MWT) Short Term: Increase workloads from initial exercise prescription for resistance, speed, and METs.;Short Term: Perform resistance training exercises routinely during rehab and add in resistance training at home;Long Term: Improve cardiorespiratory fitness, muscular endurance and strength as measured by increased METs and functional capacity (6MWT)     Able to understand and use rate of perceived exertion (RPE) scale Yes Yes Yes     Intervention Provide education and explanation on how to use RPE scale Provide education and  explanation on how to use RPE scale Provide education and explanation on how to use RPE scale     Expected Outcomes Short Term: Able to use RPE daily in rehab to express subjective intensity level;Long Term:  Able to use RPE to guide intensity level when exercising independently Short Term: Able to use RPE daily in rehab to express subjective intensity level;Long Term:  Able to use RPE to guide intensity level when exercising independently Short Term: Able to use RPE daily in rehab to express subjective intensity level;Long Term:  Able to use RPE to guide intensity level when exercising independently     Able to understand and use Dyspnea scale Yes Yes Yes     Intervention Provide education and explanation on how to use Dyspnea scale Provide education and explanation on how to use Dyspnea scale Provide education and explanation on how to use Dyspnea scale     Expected Outcomes Short Term: Able to use Dyspnea scale daily in rehab to express subjective sense of shortness of breath during exertion;Long Term: Able to use Dyspnea scale to guide intensity level when exercising independently Short Term: Able to use Dyspnea scale daily in rehab to express subjective sense of shortness of breath during exertion;Long Term: Able to use Dyspnea scale to guide intensity level when exercising independently Short Term: Able to use Dyspnea scale daily in rehab to express subjective sense of shortness of breath during exertion;Long Term: Able to use Dyspnea scale to guide intensity level when exercising independently     Knowledge and understanding of Target Heart Rate Range (THRR) Yes Yes Yes     Intervention Provide education and explanation of THRR including how the numbers were predicted and where they are located for reference Provide education and explanation of THRR including how the numbers were predicted and where they are located for reference Provide education and explanation of THRR including how the numbers were  predicted and where they are located for reference     Expected Outcomes Short Term: Able to state/look up THRR;Long Term: Able to use THRR to govern intensity when exercising independently;Short Term: Able to use daily as guideline for intensity in rehab Short Term: Able to state/look up THRR;Long Term: Able to use THRR to govern intensity when exercising independently;Short Term: Able to use daily as guideline for intensity in rehab Short Term: Able to state/look up THRR;Long Term: Able to use THRR to govern intensity when exercising independently;Short Term: Able to use daily as guideline for intensity in rehab     Understanding of Exercise Prescription Yes Yes Yes     Intervention Provide education, explanation, and written materials on patient's individual exercise prescription Provide education, explanation, and written materials on patient's individual exercise prescription Provide education, explanation, and written materials on patient's individual exercise prescription     Expected Outcomes Short Term: Able to explain program exercise prescription;Long Term: Able  to explain home exercise prescription to exercise independently Short Term: Able to explain program exercise prescription;Long Term: Able to explain home exercise prescription to exercise independently Short Term: Able to explain program exercise prescription;Long Term: Able to explain home exercise prescription to exercise independently              Exercise Goals Re-Evaluation :  Exercise Goals Re-Evaluation     Row Name 11/20/21 0806 12/13/21 1549           Exercise Goal Re-Evaluation   Exercise Goals Review Increase Physical Activity;Increase Strength and Stamina;Able to understand and use rate of perceived exertion (RPE) scale;Able to understand and use Dyspnea scale;Knowledge and understanding of Target Heart Rate Range (THRR);Understanding of Exercise Prescription Increase Physical Activity;Increase Strength and  Stamina;Able to understand and use rate of perceived exertion (RPE) scale;Able to understand and use Dyspnea scale;Knowledge and understanding of Target Heart Rate Range (THRR);Understanding of Exercise Prescription      Comments Denetra is scheduled to begin exercise this week. Will continue to monitor and progress as able. Elanda has completed 4 exercise sessions. She exercises for 15 min on the Nustep and arm ergometer. Alexza averages 1.8 METs at level 2 on the Nustep and 1.8 METs at level 1 on the arm ergometer. Aveline seem motivated to exercise. is limited by her chronic pain. Will continue to monitor and progress as able.      Expected Outcomes Through exercise at rehab and home, the patient will decrease shortness of breath with daily activities and feel confident in carrying out an exercise regimen at home. Through exercise at rehab and home, the patient will decrease shortness of breath with daily activities and feel confident in carrying out an exercise regimen at home.               Discharge Exercise Prescription (Final Exercise Prescription Changes):  Exercise Prescription Changes - 12/11/21 1500       Response to Exercise   Blood Pressure (Admit) 130/76    Blood Pressure (Exit) 116/64    Heart Rate (Admit) 102 bpm    Heart Rate (Exercise) 118 bpm    Heart Rate (Exit) 101 bpm    Oxygen Saturation (Admit) 90 %    Oxygen Saturation (Exercise) 92 %    Oxygen Saturation (Exit) 92 %    Rating of Perceived Exertion (Exercise) 10    Perceived Dyspnea (Exercise) 2    Duration Progress to 30 minutes of  aerobic without signs/symptoms of physical distress    Intensity THRR unchanged      Progression   Progression Continue to progress workloads to maintain intensity without signs/symptoms of physical distress.      Resistance Training   Weight Red bands    Reps 10-15    Time 10 Minutes      Oxygen   Oxygen Continuous    Liters 3      NuStep   Level 2    SPM 70     Minutes 15    METs 1.9      Arm/Foot Ergometer   Level 1    Minutes 15    METs 1.6             Nutrition:  Target Goals: Understanding of nutrition guidelines, daily intake of sodium '1500mg'$ , cholesterol '200mg'$ , calories 30% from fat and 7% or less from saturated fats, daily to have 5 or more servings of fruits and vegetables.  Biometrics:  Pre Biometrics - 11/14/21 1225  Pre Biometrics   Grip Strength 28 kg              Nutrition Therapy Plan and Nutrition Goals:  Nutrition Therapy & Goals - 11/29/21 1519       Nutrition Therapy   Diet Heart Healthy/Carbohydrate Consistent    Drug/Food Interactions Statins/Certain Fruits      Personal Nutrition Goals   Nutrition Goal Patient to identify and limit food sources of refined carbohydrates, simple sugars, and sugary beverages.    Personal Goal #2 Patient to build a healthy plate including vegetables, fruit, whole grains, low fat dairy, lean protein/plant protein as part of a heart healthy meal plan.      Intervention Plan   Intervention Prescribe, educate and counsel regarding individualized specific dietary modifications aiming towards targeted core components such as weight, hypertension, lipid management, diabetes, heart failure and other comorbidities.;Nutrition handout(s) given to patient.    Expected Outcomes Short Term Goal: Understand basic principles of dietary content, such as calories, fat, sodium, cholesterol and nutrients.;Short Term Goal: A plan has been developed with personal nutrition goals set during dietitian appointment.;Long Term Goal: Adherence to prescribed nutrition plan.             Nutrition Assessments:  MEDIFICTS Score Key: ?70 Need to make dietary changes  40-70 Heart Healthy Diet ? 40 Therapeutic Level Cholesterol Diet   Picture Your Plate Scores: <61 Unhealthy dietary pattern with much room for improvement. 41-50 Dietary pattern unlikely to meet recommendations for good  health and room for improvement. 51-60 More healthful dietary pattern, with some room for improvement.  >60 Healthy dietary pattern, although there may be some specific behaviors that could be improved.    Nutrition Goals Re-Evaluation:   Nutrition Goals Discharge (Final Nutrition Goals Re-Evaluation):   Psychosocial: Target Goals: Acknowledge presence or absence of significant depression and/or stress, maximize coping skills, provide positive support system. Participant is able to verbalize types and ability to use techniques and skills needed for reducing stress and depression.  Initial Review & Psychosocial Screening:  Initial Psych Review & Screening - 11/14/21 1404       Initial Review   Current issues with Current Depression   Gisella states that her depression come from having to be on oxygen. 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 She does not want to have to depend on others.     Family Dynamics   Good Support System? Yes   Her son and daughter     Barriers   Psychosocial barriers to participate in program Psychosocial barriers identified (see note)   We will assist pt with obtaining a POC as she request. She has been working with [employer] and she has also called [employer].     Screening Interventions   Interventions Encouraged to exercise;Provide feedback about the scores to participant;To provide support and resources with identified psychosocial needs    Expected Outcomes Short Term goal: Utilizing psychosocial counselor, staff and physician to assist with identification of specific Stressors or current issues interfering with healing process. Setting desired goal for each stressor or current issue identified.;Long Term Goal: Stressors or current issues are controlled or eliminated.;Short Term goal: Identification and review with participant of any Quality of Life or Depression concerns found by scoring the  questionnaire.;Long Term goal: The participant improves quality of Life and PHQ9 Scores as seen by post scores and/or verbalization of changes  Quality of Life Scores:  Scores of 19 and below usually indicate a poorer quality of life in these areas.  A difference of  2-3 points is a clinically meaningful difference.  A difference of 2-3 points in the total score of the Quality of Life Index has been associated with significant improvement in overall quality of life, self-image, physical symptoms, and general health in studies assessing change in quality of life.  PHQ-9: Review Flowsheet        11/14/2021 08/27/2021  Depression screen PHQ 2/9  Decreased Interest 1 0  Down, Depressed, Hopeless 1 0  PHQ - 2 Score 2 0  Altered sleeping 1   Tired, decreased energy 1   Change in appetite 1   Feeling bad or failure about yourself  2   Trouble concentrating 1   Moving slowly or fidgety/restless 0   Suicidal thoughts 1   PHQ-9 Score 9   Difficult doing work/chores Somewhat difficult          Interpretation of Total Score  Total Score Depression Severity:  1-4 = Minimal depression, 5-9 = Mild depression, 10-14 = Moderate depression, 15-19 = Moderately severe depression, 20-27 = Severe depression   Psychosocial Evaluation and Intervention:   Psychosocial Re-Evaluation:  Psychosocial Re-Evaluation     [place] Name 11/20/21 8546 12/13/21 1552           Psychosocial Re-Evaluation   Current issues with Current Depression;History of Depression Current Depression;History of Depression      Comments Is presently on Prozac for depression, she could benefit from therapy and is struggling with weight loss.  She has come to orientation, but due to right hand and arm pain she has not begun exercising in pulmonary rehab. Tacarra's depression is stable on current treatment, Prozac.  She was very excited to take a trip with her daughter to [facility] and had a great time, but was  exhausted upon her return and it took her 1 week to recover.      Expected Outcomes For Zaineb to improve her PHQ scores and be able to stabilize her depression. For [place] to continue to be free of barriers or psychosocial concerns while participating in pulmonary rehab.      Interventions Relaxation education;Encouraged to attend Pulmonary Rehabilitation for the exercise;Stress management education;Therapist referral Encouraged to attend Pulmonary Rehabilitation for the exercise;Stress management education;Relaxation education      Continue Psychosocial Services  Follow up required by staff Follow up required by staff        Initial Review   Source of Stress Concerns -- Chronic Illness;Unable to participate in former interests or hobbies;Unable to perform yard/household activities      Comments -- She is learning to live with supplemental oxygen and becoming more accepting of it.               Psychosocial Discharge (Final Psychosocial Re-Evaluation):  Psychosocial Re-Evaluation - 12/13/21 1552       Psychosocial Re-Evaluation   Current issues with Current Depression;History of Depression    Comments Jarita's depression is stable on current treatment, Prozac.  She was very excited to take a trip with her daughter to [facility] and had a great time, but was exhausted upon her return and it took her 1 week to recover.    Expected Outcomes For [place] to continue to be free of barriers or psychosocial concerns while participating in pulmonary rehab.    Interventions Encouraged to attend Pulmonary Rehabilitation for the exercise;Stress management education;Relaxation  education    Continue Psychosocial Services  Follow up required by staff      Initial Review   Source of Stress Concerns Chronic Illness;Unable to participate in former interests or hobbies;Unable to perform yard/household activities    Comments She is learning to live with supplemental oxygen and becoming more accepting  of it.             Education: Education Goals: Education classes will be provided on a weekly basis, covering required topics. Participant will state understanding/return demonstration of topics presented.  Learning Barriers/Preferences:   Education Topics: Risk Factor Reduction:  -Group instruction that is supported by a PowerPoint presentation. Instructor discusses the definition of a risk factor, different risk factors for pulmonary disease, and how the heart and lungs work together.     Nutrition for Pulmonary Patient:  -Group instruction provided by PowerPoint slides, verbal discussion, and written materials to support subject matter. The instructor gives an explanation and review of healthy diet recommendations, which includes a discussion on weight management, recommendations for fruit and vegetable consumption, as well as protein, fluid, caffeine, fiber, sodium, sugar, and alcohol. Tips for eating when patients are short of breath are discussed.   Pursed Lip Breathing:  -Group instruction that is supported by demonstration and informational handouts. Instructor discusses the benefits of pursed lip and diaphragmatic breathing and detailed demonstration on how to preform both.     Oxygen Safety:  -Group instruction provided by PowerPoint, verbal discussion, and written material to support subject matter. There is an overview of "What is Oxygen" and "Why do we need it".  Instructor also reviews how to create a safe environment for oxygen use, the importance of using oxygen as prescribed, and the risks of noncompliance. There is a brief discussion on traveling with oxygen and resources the patient may utilize.   Oxygen Equipment:  -Group instruction provided by [facility] Staff utilizing handouts, written materials, and equipment demonstrations.   Signs and Symptoms:  -Group instruction provided by written material and verbal discussion to support subject matter. Warning  signs and symptoms of infection, stroke, and heart attack are reviewed and when to call the physician/911 reinforced. Tips for preventing the spread of infection discussed.   Advanced Directives:  -Group instruction provided by verbal instruction and written material to support subject matter. Instructor reviews Advanced Directive laws and proper instruction for filling out document.   Pulmonary Video:  -Group video education that reviews the importance of medication and oxygen compliance, exercise, good nutrition, pulmonary hygiene, and pursed lip and diaphragmatic breathing for the pulmonary patient.   Exercise for the Pulmonary Patient:  -Group instruction that is supported by a PowerPoint presentation. Instructor discusses benefits of exercise, core components of exercise, frequency, duration, and intensity of an exercise routine, importance of utilizing pulse oximetry during exercise, safety while exercising, and options of places to exercise outside of rehab.   Flowsheet Row PULMONARY REHAB OTHER RESPIRATORY from 12/13/2021 in [place]  Date 12/13/21  [facility] Levels]  Educator Donnetta Simpers  Instruction Review Code 1- Verbalizes Understanding       Pulmonary Medications:  -Verbally interactive group education provided by instructor with focus on inhaled medications and proper administration.   Anatomy and Physiology of the Respiratory System and Intimacy:  -Group instruction provided by PowerPoint, verbal discussion, and written material to support subject matter. Instructor reviews respiratory cycle and anatomical components of the respiratory system and their functions. Instructor also reviews differences in obstructive and restrictive  respiratory diseases with examples of each. Intimacy, Sex, and Sexuality differences are reviewed with a discussion on how relationships can change when diagnosed with pulmonary disease. Common sexual concerns are  reviewed.   MD DAY -A group question and answer session with a medical doctor that allows participants to ask questions that relate to their pulmonary disease state.   OTHER EDUCATION -Group or individual verbal, written, or video instructions that support the educational goals of the pulmonary rehab program.   Holiday Eating Survival Tips:  -Group instruction provided by PowerPoint slides, verbal discussion, and written materials to support subject matter. The instructor gives patients tips, tricks, and techniques to help them not only survive but enjoy the holidays despite the onslaught of food that accompanies the holidays.   Knowledge Questionnaire Score:  Knowledge Questionnaire Score - 11/14/21 1441       Knowledge Questionnaire Score   Pre Score 14/18             Core Components/Risk Factors/Patient Goals at Admission:  Personal Goals and Risk Factors at Admission - 11/14/21 1414       Core Components/Risk Factors/Patient Goals on Admission    Weight Management Yes;Obesity;Weight Loss    Intervention Weight Management: Develop a combined nutrition and exercise program designed to reach desired caloric intake, while maintaining appropriate intake of nutrient and fiber, sodium and fats, and appropriate energy expenditure required for the weight goal.;Weight Management: Provide education and appropriate resources to help participant work on and attain dietary goals.;Weight Management/Obesity: Establish reasonable short term and long term weight goals.;Obesity: Provide education and appropriate resources to help participant work on and attain dietary goals.    Admit Weight 228 lb 2.8 oz (103.5 kg)    Improve shortness of breath with ADL's Yes    Intervention Provide education, individualized exercise plan and daily activity instruction to help decrease symptoms of SOB with activities of daily living.    Expected Outcomes Short Term: Improve cardiorespiratory fitness to  achieve a reduction of symptoms when performing ADLs;Long Term: Be able to perform more ADLs without symptoms or delay the onset of symptoms    Increase knowledge of respiratory medications and ability to use respiratory devices properly  Yes    Intervention Provide education and demonstration as needed of appropriate use of medications, inhalers, and oxygen therapy.    Expected Outcomes Short Term: Achieves understanding of medications use. Understands that oxygen is a medication prescribed by physician. Demonstrates appropriate use of inhaler and oxygen therapy.;Long Term: Maintain appropriate use of medications, inhalers, and oxygen therapy.             Core Components/Risk Factors/Patient Goals Review:   Goals and Risk Factor Review     Row Name 11/20/21 0944 12/13/21 1555 12/13/21 1608         Core Components/Risk Factors/Patient Goals Review   Personal Goals Review Weight Management/Obesity;Develop more efficient breathing techniques such as purse lipped breathing and diaphragmatic breathing and practicing self-pacing with activity.;Increase knowledge of respiratory medications and ability to use respiratory devices properly.;Improve shortness of breath with ADL's Weight Management/Obesity;Develop more efficient breathing techniques such as purse lipped breathing and diaphragmatic breathing and practicing self-pacing with activity.;Increase knowledge of respiratory medications and ability to use respiratory devices properly.;Improve shortness of breath with ADL's --     Review Andriana has not started exercising in pulmonary rahab due to right hand and arm pain Sahasra has attended 4 exercise sessions, her CBG's have been WNL pre and post exercise.  She seems  to enjoy exercising and is slow to progress due to her 2 week absence.  She essentially is restarting her exercise regime.  She is exercising at 1.8 mets on the arm ergomenter and 1.8 mets on the nustep. Also Othello has not lost any  weight since starting the program.     Expected Outcomes See admission goals. See admission goals. --              Core Components/Risk Factors/Patient Goals at Discharge (Final Review):   Goals and Risk Factor Review - 12/13/21 1608       Core Components/Risk Factors/Patient Goals Review   Review Also Delories has not lost any weight since starting the program.             ITP Comments: Pt is making expected progress toward Pulmonary Rehab goals after completing 5 sessions. Recommend continued exercise, life style modification, education, and utilization of breathing techniques to increase stamina and strength, while also decreasing shortness of breath with exertion.  Dr. Rodman Pickle is Medical Director for Pulmonary Rehab at [facility].

## 2021-12-20 ENCOUNTER — Encounter (HOSPITAL_COMMUNITY)
Admission: RE | Admit: 2021-12-20 | Discharge: 2021-12-20 | Disposition: A | Discharge status: Home / Self Care | Payer: Medicare HMO | Source: Ambulatory Visit | Attending: Pulmonary Disease | Admitting: Pulmonary Disease

## 2021-12-20 ENCOUNTER — Encounter (HOSPITAL_COMMUNITY): Payer: Medicare HMO

## 2021-12-20 DIAGNOSIS — D860 Sarcoidosis of lung: Secondary | ICD-10-CM | POA: Diagnosis not present

## 2021-12-20 NOTE — Progress Notes (Signed)
Daily Session Note  Patient Details  Name: Cassandra Burns MRN: [id number] Date of Birth: September 23, 1965 Referring Provider:    Encounter Date: 12/20/2021  Check In:  Session Check In - 12/20/21 1413       Check-In   Supervising physician immediately available to respond to emergencies [facility] immediately available    Physician(s) Dr. Manuella Ghazi    Location [facility]    Staff Present Rosebud Poles, RN, BSN;Lisa Ysidro Evert, Cathleen Fears, MS, ACSM-CEP, Exercise Physiologist    Virtual Visit No    Medication changes reported     No    Fall or balance concerns reported    No    Tobacco Cessation No Change    Warm-up and Cool-down Performed as group-led instruction    Resistance Training Performed Yes    VAD Patient? No    PAD/SET Patient? No      Pain Assessment   Currently in Pain? No/denies    Multiple Pain Sites No             Capillary Blood Glucose: No results found for this or any previous visit (from the past 24 hour(s)).    Social History   Tobacco Use  Smoking Status Former   Types: Cigars   Quit date: 06/30/2017   Years since quitting: 4.4  Smokeless Tobacco Never  Tobacco Comments   Smokes Marijuana ("here and there") (as needed)    Goals Met:  Proper associated with RPD/PD & O2 Sat Exercise tolerated well No report of concerns or symptoms today Strength training completed today  Goals Unmet:  Not Applicable  Comments: Service time is from 1320 to 1440    Dr. Rodman Pickle is Medical Director for Pulmonary Rehab at [facility].

## 2021-12-25 ENCOUNTER — Telehealth: Payer: Self-pay | Admitting: Nurse Practitioner

## 2021-12-25 ENCOUNTER — Encounter (HOSPITAL_COMMUNITY): Payer: Medicare HMO

## 2021-12-25 ENCOUNTER — Encounter (HOSPITAL_COMMUNITY)
Admission: RE | Admit: 2021-12-25 | Discharge: 2021-12-25 | Disposition: A | Discharge status: Home / Self Care | Payer: Medicare HMO | Source: Ambulatory Visit | Attending: Pulmonary Disease | Admitting: Pulmonary Disease

## 2021-12-25 VITALS — Wt 231.3 lb

## 2021-12-25 DIAGNOSIS — D860 Sarcoidosis of lung: Secondary | ICD-10-CM | POA: Diagnosis not present

## 2021-12-25 NOTE — Progress Notes (Signed)
Daily Session Note  Patient Details  Name: Cassandra Burns MRN: [id number] Date of Birth: [birth year] Referring Provider:    Encounter Date: 12/25/2021  Check In:  Session Check In - 12/25/21 1331       Check-In   Supervising physician immediately available to respond to emergencies [facility] immediately available    Physician(s) Dr. Tawanna Solo    Location [facility]    Staff Present Rosebud Poles, RN, BSN;Travis Mastel Ysidro Evert, RN;Olinty Celesta Aver, MS, ACSM CEP, Exercise Physiologist;Kaylee Rosana Hoes, MS, ACSM-CEP, Exercise Physiologist    Virtual Visit No    Medication changes reported     No    Fall or balance concerns reported    No    Tobacco Cessation No Change    Warm-up and Cool-down Performed as group-led instruction    Resistance Training Performed Yes    VAD Patient? No    PAD/SET Patient? No      Pain Assessment   Currently in Pain? No/denies    Multiple Pain Sites No             Capillary Blood Glucose: No results found for this or any previous visit (from the past 24 hour(s)).  POCT Glucose - 12/25/21 1507       POCT Blood Glucose   Pre-Exercise 182 mg/dL    Post-Exercise 196 mg/dL             Exercise Prescription Changes - 12/25/21 1500       Response to Exercise   Blood Pressure (Admit) 138/82    Blood Pressure (Exercise) 138/70    Blood Pressure (Exit) 138/80    Heart Rate (Admit) 101 bpm    Heart Rate (Exercise) 121 bpm    Heart Rate (Exit) 100 bpm    Oxygen Saturation (Admit) 92 %    Oxygen Saturation (Exercise) 92 %    Oxygen Saturation (Exit) 95 %    Rating of Perceived Exertion (Exercise) 11    Perceived Dyspnea (Exercise) 2    Duration Continue with 30 min of aerobic exercise without signs/symptoms of physical distress.    Intensity THRR unchanged      Progression   Progression Continue to progress workloads to maintain intensity without signs/symptoms of physical distress.      Resistance Training   Weight  Red bands    Reps 10-15    Time 10 Minutes      Oxygen   Oxygen Continuous    Liters 3      NuStep   Level 4    SPM 70    Minutes 15    METs 2.1      Arm/Foot Ergometer   Level 1.5    Minutes 15    METs 1.7             Social History   Tobacco Use  Smoking Status Former   Types: Cigars   Quit date: 06/30/2017   Years since quitting: 4.4  Smokeless Tobacco Never  Tobacco Comments   Smokes Marijuana ("here and there") (as needed)    Goals Met:  Exercise tolerated well No report of concerns or symptoms today Strength training completed today  Goals Unmet:  Not Applicable  Comments: Service time is from 1316 to [place]    Dr. Rodman Pickle is Medical Director for Pulmonary Rehab at [facility].

## 2021-12-25 NOTE — Telephone Encounter (Signed)
Fax received from Dr.? with [facility] to perform a Endoscopy/Colonoscopy on patient.  Patient needs surgery clearance. Patient was seen on 12/03/2021. Office protocol is a risk assessment can be sent to surgeon if patient has been seen in 60 days or less.   Sending to [employer] for risk assessment or recommendations if patient needs to be seen in office prior to surgical procedure.

## 2021-12-26 ENCOUNTER — Other Ambulatory Visit: Payer: Self-pay | Admitting: Gastroenterology

## 2021-12-27 ENCOUNTER — Encounter (HOSPITAL_COMMUNITY)
Admission: RE | Admit: 2021-12-27 | Discharge: 2021-12-27 | Disposition: A | Discharge status: Home / Self Care | Payer: Medicare HMO | Source: Ambulatory Visit | Attending: Pulmonary Disease | Admitting: Pulmonary Disease

## 2021-12-27 ENCOUNTER — Encounter (HOSPITAL_COMMUNITY): Payer: Medicare HMO

## 2021-12-27 DIAGNOSIS — D860 Sarcoidosis of lung: Secondary | ICD-10-CM | POA: Diagnosis not present

## 2021-12-27 DIAGNOSIS — Z794 Long term (current) use of insulin: Secondary | ICD-10-CM | POA: Insufficient documentation

## 2021-12-27 DIAGNOSIS — E119 Type 2 diabetes mellitus without complications: Secondary | ICD-10-CM | POA: Diagnosis not present

## 2021-12-27 DIAGNOSIS — Z20822 Contact with and (suspected) exposure to covid-19: Secondary | ICD-10-CM | POA: Diagnosis not present

## 2021-12-27 NOTE — Progress Notes (Signed)
Daily Session Note  Patient Details  Name: Cassandra Burns MRN: [id number] Date of Birth: 05/03/1966 Referring Provider:    Encounter Date: 12/27/2021  Check In:  Session Check In - 12/27/21 1420       Check-In   Supervising physician immediately available to respond to emergencies [facility] immediately available    Physician(s) Dr. Akula    Location [facility]    Staff Present Joan Behrens, RN, BSN;Lisa Hughes, RN;Kaylee Davis, MS, ACSM-CEP, Exercise Physiologist    Virtual Visit No    Medication changes reported     No    Fall or balance concerns reported    No    Tobacco Cessation No Change    Warm-up and Cool-down Performed as group-led instruction    Resistance Training Performed Yes    VAD Patient? No    PAD/SET Patient? No      Pain Assessment   Currently in Pain? No/denies    Multiple Pain Sites No             Capillary Blood Glucose: No results found for this or any previous visit (from the past 24 hour(s)).    Social History   Tobacco Use  Smoking Status Former   Types: Cigars   Quit date: 06/30/2017   Years since quitting: 4.4  Smokeless Tobacco Never  Tobacco Comments   Smokes Marijuana ("here and there") (as needed)    Goals Met:  Proper associated with RPD/PD & O2 Sat Exercise tolerated well No report of concerns or symptoms today Strength training completed today  Goals Unmet:  Not Applicable  Comments: Service time is from 1322 to 1445    Dr. Jane Ellison is Medical Director for Pulmonary Rehab at  Hospital.  

## 2022-01-01 ENCOUNTER — Encounter (HOSPITAL_COMMUNITY)
Admission: RE | Admit: 2022-01-01 | Discharge: 2022-01-01 | Disposition: A | Discharge status: Home / Self Care | Payer: Medicare HMO | Source: Ambulatory Visit | Attending: Pulmonary Disease | Admitting: Pulmonary Disease

## 2022-01-01 ENCOUNTER — Encounter (HOSPITAL_COMMUNITY): Payer: Medicare HMO

## 2022-01-01 DIAGNOSIS — E119 Type 2 diabetes mellitus without complications: Secondary | ICD-10-CM | POA: Diagnosis not present

## 2022-01-01 DIAGNOSIS — Z794 Long term (current) use of insulin: Secondary | ICD-10-CM | POA: Diagnosis not present

## 2022-01-01 DIAGNOSIS — D860 Sarcoidosis of lung: Secondary | ICD-10-CM

## 2022-01-01 NOTE — Progress Notes (Signed)
Daily Session Note  Patient Details  Name: Cassandra Burns MRN: [id number] Date of Birth: 24-Feb-1966 Referring Provider:    Encounter Date: 01/01/2022  Check In:  Session Check In - 01/01/22 1331       Check-In   Supervising physician immediately available to respond to emergencies [facility] immediately available    Physician(s) Dr. Karleen Hampshire    Location [facility]    Staff Present Rosebud Poles, RN, BSN;Chimamanda Siegfried Ysidro Evert, Cathleen Fears, MS, ACSM-CEP, Exercise Physiologist    Virtual Visit No    Medication changes reported     No    Fall or balance concerns reported    No    Tobacco Cessation No Change    Warm-up and Cool-down Performed as group-led instruction    Resistance Training Performed Yes    VAD Patient? No    PAD/SET Patient? No      Pain Assessment   Currently in Pain? No/denies    Multiple Pain Sites No             Capillary Blood Glucose: No results found for this or any previous visit (from the past 24 hour(s)).    Social History   Tobacco Use  Smoking Status Former   Types: Cigars   Quit date: 06/30/2017   Years since quitting: 4.5  Smokeless Tobacco Never  Tobacco Comments   Smokes Marijuana ("here and there") (as needed)    Goals Met:  Exercise tolerated well No report of concerns or symptoms today Strength training completed today  Goals Unmet:  Not Applicable  Comments: Service time is from 1317 to [place]    Dr. Rodman Pickle is Medical Director for Pulmonary Rehab at [facility].

## 2022-01-03 ENCOUNTER — Encounter (HOSPITAL_COMMUNITY): Payer: Medicare HMO

## 2022-01-03 ENCOUNTER — Ambulatory Visit: Payer: Medicare HMO | Admitting: Nurse Practitioner

## 2022-01-03 ENCOUNTER — Encounter (HOSPITAL_COMMUNITY)
Admission: RE | Admit: 2022-01-03 | Discharge: 2022-01-03 | Disposition: A | Discharge status: Home / Self Care | Payer: Medicare HMO | Source: Ambulatory Visit | Attending: Pulmonary Disease | Admitting: Pulmonary Disease

## 2022-01-03 ENCOUNTER — Ambulatory Visit: Payer: Medicare HMO

## 2022-01-03 VITALS — Wt 233.7 lb

## 2022-01-03 DIAGNOSIS — D860 Sarcoidosis of lung: Secondary | ICD-10-CM | POA: Diagnosis not present

## 2022-01-03 DIAGNOSIS — Z794 Long term (current) use of insulin: Secondary | ICD-10-CM | POA: Diagnosis not present

## 2022-01-03 DIAGNOSIS — E119 Type 2 diabetes mellitus without complications: Secondary | ICD-10-CM | POA: Diagnosis not present

## 2022-01-03 NOTE — Progress Notes (Signed)
Daily Session Note  Patient Details  Name: AMERIS AKAMINE MRN: [id number] Date of Birth: 1965-10-27 Referring Provider:    Encounter Date: 01/03/2022  Check In:  Session Check In - 01/03/22 1421       Check-In   Supervising physician immediately available to respond to emergencies [facility] immediately available    Physician(s) Dr. Cyndia Skeeters    Location [facility]    Staff Present Rosebud Poles, RN, BSN;Alder Murri Ysidro Evert, Cathleen Fears, MS, ACSM-CEP, Exercise Physiologist    Virtual Visit No    Medication changes reported     No    Fall or balance concerns reported    No    Tobacco Cessation No Change    Warm-up and Cool-down Performed as group-led instruction    Resistance Training Performed Yes    VAD Patient? No    PAD/SET Patient? No      Pain Assessment   Currently in Pain? No/denies    Multiple Pain Sites No             Capillary Blood Glucose: No results found for this or any previous visit (from the past 24 hour(s)).    Social History   Tobacco Use  Smoking Status Former   Types: Cigars   Quit date: 06/30/2017   Years since quitting: 4.5  Smokeless Tobacco Never  Tobacco Comments   Smokes Marijuana ("here and there") (as needed)    Goals Met:  Exercise tolerated well No report of concerns or symptoms today Strength training completed today  Goals Unmet:  Not Applicable  Comments: Service time is from 1320 to [place]    Dr. Rodman Pickle is Medical Director for Pulmonary Rehab at [facility].

## 2022-01-07 NOTE — Telephone Encounter (Signed)
She was treated for asthma exacerbation at last OV. Please verify with patient that her breathing is back to her baseline after prednisone course. If so, route back to me and I will add addendum for surgical clearance. If not, needs OV. Thanks!

## 2022-01-08 ENCOUNTER — Telehealth (HOSPITAL_COMMUNITY): Payer: Self-pay | Admitting: Internal Medicine

## 2022-01-08 ENCOUNTER — Encounter (HOSPITAL_COMMUNITY): Payer: Medicare HMO

## 2022-01-08 ENCOUNTER — Encounter (HOSPITAL_COMMUNITY)
Admission: RE | Admit: 2022-01-08 | Discharge: 2022-01-08 | Disposition: A | Discharge status: Home / Self Care | Payer: Medicare HMO | Source: Ambulatory Visit

## 2022-01-10 ENCOUNTER — Encounter (HOSPITAL_COMMUNITY)
Admission: RE | Admit: 2022-01-10 | Discharge: 2022-01-10 | Disposition: A | Discharge status: Home / Self Care | Payer: Medicare HMO | Source: Ambulatory Visit | Attending: Pulmonary Disease | Admitting: Pulmonary Disease

## 2022-01-10 ENCOUNTER — Encounter (HOSPITAL_COMMUNITY): Payer: Medicare HMO

## 2022-01-10 DIAGNOSIS — E119 Type 2 diabetes mellitus without complications: Secondary | ICD-10-CM | POA: Diagnosis not present

## 2022-01-10 DIAGNOSIS — D860 Sarcoidosis of lung: Secondary | ICD-10-CM

## 2022-01-10 DIAGNOSIS — Z794 Long term (current) use of insulin: Secondary | ICD-10-CM | POA: Diagnosis not present

## 2022-01-10 NOTE — Progress Notes (Signed)
Daily Session Note  Patient Details  Name: Cassandra Burns MRN: [id number] Date of Birth: 10/27/1965 Referring Provider:    Encounter Date: 01/10/2022  Check In:  Session Check In - 01/10/22 1425       Check-In   Supervising physician immediately available to respond to emergencies [facility] immediately available    Physician(s) Dr. Cyndia Skeeters    Staff Present Rodney Langton, Cathleen Fears, MS, ACSM-CEP, Exercise Physiologist;Jetta Gilford Rile BS, ACSM EP-C, Exercise Physiologist    Virtual Visit No    Medication changes reported     No    Fall or balance concerns reported    No    Tobacco Cessation No Change    Warm-up and Cool-down Performed as group-led instruction    Resistance Training Performed Yes    VAD Patient? No    PAD/SET Patient? No      Pain Assessment   Currently in Pain? No/denies    Multiple Pain Sites No             Capillary Blood Glucose: No results found for this or any previous visit (from the past 24 hour(s)).    Social History   Tobacco Use  Smoking Status Former   Types: Cigars   Quit date: 06/30/2017   Years since quitting: 4.5  Smokeless Tobacco Never  Tobacco Comments   Smokes Marijuana ("here and there") (as needed)    Goals Met:  Independence with exercise equipment No report of concerns or symptoms today Strength training completed today  Goals Unmet:   O2 Sat Pt walked on the track today on 3 liters of oxygen she dropped to 77% increased to 4 liters saturation improved to 94%. Checked again saturations dropped to 83%, oxygen increased to 6 liters. Saturation then 94%. Comments: Service time is from 1323 to [place]   Dr. Rodman Pickle is Medical Director for Pulmonary Rehab at [facility].

## 2022-01-10 NOTE — Telephone Encounter (Signed)
Called and spoke with patient to see how she is feeling prior to upcoming procedures. She states that she is feeling fine now but has pushed back her procedure till October timeframe because she is not ready to be put under and the procedure done. Advised her to have them send over another form when she is rescheduled and ready. She expressed understanding. Nothing further needed at this time.

## 2022-01-14 ENCOUNTER — Other Ambulatory Visit: Payer: Self-pay | Admitting: Internal Medicine

## 2022-01-14 DIAGNOSIS — Z1231 Encounter for screening mammogram for malignant neoplasm of breast: Secondary | ICD-10-CM

## 2022-01-14 DIAGNOSIS — J9611 Chronic respiratory failure with hypoxia: Secondary | ICD-10-CM | POA: Diagnosis not present

## 2022-01-14 DIAGNOSIS — D860 Sarcoidosis of lung: Secondary | ICD-10-CM | POA: Diagnosis not present

## 2022-01-15 ENCOUNTER — Encounter (HOSPITAL_COMMUNITY)
Admission: RE | Admit: 2022-01-15 | Discharge: 2022-01-15 | Disposition: A | Discharge status: Home / Self Care | Payer: Medicare HMO | Source: Ambulatory Visit | Attending: Pulmonary Disease | Admitting: Pulmonary Disease

## 2022-01-15 DIAGNOSIS — D860 Sarcoidosis of lung: Secondary | ICD-10-CM

## 2022-01-15 DIAGNOSIS — E119 Type 2 diabetes mellitus without complications: Secondary | ICD-10-CM | POA: Diagnosis not present

## 2022-01-15 DIAGNOSIS — Z794 Long term (current) use of insulin: Secondary | ICD-10-CM | POA: Diagnosis not present

## 2022-01-15 NOTE — Progress Notes (Signed)
Daily Session Note  Patient Details  Name: Cassandra Burns MRN: [id number] Date of Birth: Aug 08, 1965 Referring Provider:    Encounter Date: 01/15/2022  Check In:  Session Check In - 01/15/22 1430       Check-In   Supervising physician immediately available to respond to emergencies [facility] immediately available    Physician(s) Dr. Karleen Hampshire    Location [facility]    Staff Present Rosebud Poles, RN, BSN;Lisa Ysidro Evert, Cathleen Fears, MS, ACSM-CEP, Exercise Physiologist    Virtual Visit No    Medication changes reported     No    Fall or balance concerns reported    No    Tobacco Cessation No Change    Warm-up and Cool-down Performed as group-led instruction    Resistance Training Performed Yes    VAD Patient? No    PAD/SET Patient? No      Pain Assessment   Currently in Pain? No/denies    Multiple Pain Sites No             Capillary Blood Glucose: No results found for this or any previous visit (from the past 24 hour(s)).    Social History   Tobacco Use  Smoking Status Former   Types: Cigars   Quit date: 06/30/2017   Years since quitting: 4.5  Smokeless Tobacco Never  Tobacco Comments   Smokes Marijuana ("here and there") (as needed)    Goals Met:  Proper associated with RPD/PD & O2 Sat Exercise tolerated well No report of concerns or symptoms today Strength training completed today  Goals Unmet:  Not Applicable  Comments: Service time is from 1325 to 1450.    Dr. Rodman Pickle is Medical Director for Pulmonary Rehab at [facility].

## 2022-01-16 NOTE — Progress Notes (Addendum)
Pulmonary Individual Treatment Plan  Patient Details  Name: Cassandra Burns MRN: [id number] Date of Birth: [birth year] Referring Provider:  Dr. 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[place])  Patient's Home Medications on Admission:   Current Outpatient Medications:    acetaminophen (TYLENOL) 500 MG tablet, Take 1 tablet (500 mg total) by mouth every 6 (six) hours as needed., Disp: 30 tablet, Rfl: 0   albuterol (PROVENTIL) (2.5 MG/3ML) 0.083% nebulizer solution, Take 3 mLs (2.5 mg total) by nebulization every 6 (six) hours as needed for wheezing or shortness of breath., Disp: 75 mL, Rfl: 12   albuterol (VENTOLIN HFA) 108 (90 Base) MCG/ACT inhaler, Inhale 1-2 puffs into the lungs every 6 (six) hours as needed for wheezing or shortness of breath., Disp: 18 g, Rfl: 6   allopurinol (ZYLOPRIM) 100 MG tablet, Take 100 mg by mouth 2 (two) times daily., Disp: , Rfl:    amLODipine (NORVASC) 10 MG tablet, Take 1 tablet (10 mg total) by mouth daily after lunch., Disp: 30 tablet, Rfl: 3   FLUoxetine (PROZAC) 40 MG capsule, Take 40 mg by mouth daily., Disp: , Rfl:    fluticasone (FLONASE) 50 MCG/ACT nasal spray, Place 1 spray into both nostrils daily as needed for allergies or rhinitis., Disp: , Rfl:    Fluticasone-Umeclidin-Vilant (TRELEGY ELLIPTA) 200-62.5-25 MCG/ACT AEPB, Inhale 1 puff into the lungs daily., Disp: , Rfl:    furosemide (LASIX) 20 MG tablet, Take 1 tablet (20 mg total) by mouth daily. (Patient taking differently: Take 20 mg by mouth daily as needed for edema.), Disp: 90 tablet, Rfl: 0   gabapentin (NEURONTIN) 800 MG tablet, Take 800 mg by mouth 3 (three) times daily., Disp: , Rfl:    labetalol (NORMODYNE) 100 MG tablet, Take 100 mg by mouth daily., Disp: , Rfl:    losartan-hydrochlorothiazide (HYZAAR) 100-25 MG tablet, Take 1 tablet by mouth daily after lunch., Disp: , Rfl:    MOUNJARO 2.5 MG/0.5ML Pen, Inject into the skin., Disp: , Rfl:     OXYGEN, Inhale 2-3 L into the lungs continuous., Disp: , Rfl:    predniSONE (DELTASONE) 10 MG tablet, 4 tabs for 2 days, then 3 tabs for 2 days, 2 tabs for 2 days, then 1 tab for 2 days, then stop, Disp: 20 tablet, Rfl: 0   rosuvastatin (CRESTOR) 40 MG tablet, Take 1 tablet (40 mg total) by mouth daily., Disp: 90 tablet, Rfl: 3   SYNJARDY XR 25-1000 MG TB24, Take 1 tablet by mouth daily., Disp: , Rfl:    TOUJEO SOLOSTAR 300 UNIT/ML Solostar Pen, Inject 40 Units into the skin 2 (two) times daily after a meal. (Patient taking differently: Inject 60 Units into the skin 2 (two) times daily after a meal.), Disp: , Rfl:    Vitamin D, Ergocalciferol, (DRISDOL) 1.25 MG (50000 UNIT) CAPS capsule, Take 50,000 Units by mouth every Monday., Disp: , Rfl:   Past Medical History: Past Medical History:  Diagnosis Date   Anxiety    Asthma    Bone pain 06/07/2013   Depression    Diabetes mellitus 12/18/2012   NIDDM   Hernia of abdominal cavity 10/25/2013   History of anemia 07/26/2013   Hypertension    Iron deficiency anemia due to chronic blood loss    Lymphadenopathy 06/07/2013   Obesity 12/18/2012   Psoriasis    Sarcoidosis 07/26/2013   Sleep apnea    uses bipap    Tobacco Use: Social History   Tobacco Use  Smoking Status Former   Types: Cigars   Quit date: 06/30/2017   Years since quitting: 4.5  Smokeless Tobacco Never  Tobacco Comments   Smokes Marijuana ("here and there") (as needed)    Labs: Review Flowsheet  More data exists      Latest Ref Rng & Units 07/12/2014 03/23/2021 04/23/2021 09/27/2021  Labs for ITP Cardiac and Pulmonary Rehab  Cholestrol <200 mg/dL - 258  - -  LDL (calc) mg/dL (calc) - 182  - -  HDL-C > OR = 50 mg/dL - 39  - -  Trlycerides <150 mg/dL - 206  - -  Hemoglobin A1c 4.8 - 5.6 % 10.0  - 8.1  10.2   PH, Arterial 7.35 - 7.45 - - - -  PCO2 arterial 32 - 48 mmHg - - - -  Bicarbonate 20.0 - 28.0 mmol/L - - - 24.2   Acid-base deficit 0.0 - 2.0 mmol/L - - - 0.5    O2 Saturation % - - - 88       09/29/2021  Labs for ITP Cardiac and Pulmonary Rehab  Cholestrol -  LDL (calc) -  HDL-C -  Trlycerides -  Hemoglobin A1c -  PH, Arterial 7.3   PCO2 arterial 55   Bicarbonate 27.1   Acid-base deficit 0.1   O2 Saturation 94.6     Capillary Blood Glucose: Lab Results  Component Value Date   GLUCAP 116 (H) 11/29/2021   GLUCAP 159 (H) 11/29/2021   GLUCAP 110 (H) 11/27/2021   GLUCAP 132 (H) 11/27/2021   GLUCAP 256 (H) 09/30/2021    POCT Glucose     Row Name 12/11/21 1554 12/25/21 1507           POCT Blood Glucose   Pre-Exercise 192 mg/dL 182 mg/dL      Post-Exercise 157 mg/dL 196 mg/dL               Pulmonary Assessment Scores:  Pulmonary Assessment Scores     Row Name 11/14/21 1440 01/15/22 1623       ADL UCSD   ADL Phase Entry Exit    SOB Score total 64 76      CAT Score   CAT Score 19 19      mMRC Score   mMRC Score 4 --            UCSD: Self-administered rating of dyspnea associated with activities of daily living (ADLs) 6-point scale (0 = "not at all" to 5 = "maximal or unable to do because of breathlessness")  Scoring Scores range from 0 to 120.  Minimally important difference is 5 units  CAT: CAT can identify the health impairment of COPD patients and is better correlated with disease progression.  CAT has a scoring range of zero to 40. The CAT score is classified into four groups of low (less than 10), medium (10 - 20), high (21-30) and very high (31-40) based on the impact level of disease on health status. A CAT score over 10 suggests significant symptoms.  A worsening CAT score could be explained by an exacerbation, poor medication adherence, poor inhaler technique, or progression of COPD or comorbid conditions.  CAT MCID is 2 points  mMRC: mMRC (Modified Medical Research Council) Dyspnea Scale is used to assess the degree of baseline functional disability in patients of respiratory disease due to  dyspnea. No minimal important difference is established. A decrease in score of 1 point or greater is considered a positive change.  Pulmonary Function Assessment:  Pulmonary Function Assessment - 11/14/21 1229       Breath   Bilateral Breath Sounds Decreased    Shortness of Breath Yes;Limiting activity             Exercise Target Goals: Exercise Program Goal: Individual exercise prescription set using results from initial 6 min walk test and THRR while considering  patient's activity barriers and safety.   Exercise Prescription Goal: Initial exercise prescription builds to 30-45 minutes a day of aerobic activity, 2-3 days per week.  Home exercise guidelines will be given to patient during program as part of exercise prescription that the participant will acknowledge.  Activity Barriers & Risk Stratification:  Activity Barriers & Cardiac Risk Stratification - 11/14/21 1224       Activity Barriers & Cardiac Risk Stratification   Activity Barriers Arthritis;Back Problems;Joint Problems;Deconditioning;Muscular Weakness;Shortness of Breath    Cardiac Risk Stratification Moderate             6 Minute Walk:  6 Minute Walk     Row Name 11/14/21 1216         6 Minute Walk   Phase Initial     Distance 800 feet     Walk Time 6 minutes     # of Rest Breaks 1  2:45-3:10     MPH 1.52     METS 4.37     RPE 11     Perceived Dyspnea  2     VO2 Peak 15.31     Symptoms No     Resting HR 110 bpm     Resting BP 110/70     Resting Oxygen Saturation  93 %     Exercise Oxygen Saturation  during 6 min walk 88 %     Max Ex. HR 145 bpm     Max Ex. BP 148/70     2 Minute Post BP 128/70       Interval HR   1 Minute HR 124     2 Minute HR 130     3 Minute HR 128     4 Minute HR 132     5 Minute HR 141     6 Minute HR 145     2 Minute Post HR 109     Interval Heart Rate? Yes       Interval Oxygen   Interval Oxygen? Yes     Baseline Oxygen Saturation % 93 %     1 Minute  Oxygen Saturation % 93 %     1 Minute Liters of Oxygen 3 L     2 Minute Oxygen Saturation % 89 %     2 Minute Liters of Oxygen 3 L     3 Minute Oxygen Saturation % 88 %     3 Minute Liters of Oxygen 3 L     4 Minute Oxygen Saturation % 90 %     4 Minute Liters of Oxygen 3 L     5 Minute Oxygen Saturation % 88 %     5 Minute Liters of Oxygen 3 L     6 Minute Oxygen Saturation % 88 %     6 Minute Liters of Oxygen 3 L     2 Minute Post Oxygen Saturation % 98 %     2 Minute Post Liters of Oxygen 3 L              Oxygen Initial Assessment:  Oxygen Initial  Assessment - 11/14/21 1228       Initial 6 min Walk   Oxygen Used Continuous    Liters per minute 3      Program Oxygen Prescription   Program Oxygen Prescription Continuous      Intervention   Short Term Goals To learn and exhibit compliance with exercise, home and travel O2 prescription;To learn and understand importance of monitoring SPO2 with pulse oximeter and demonstrate accurate use of the pulse oximeter.;To learn and understand importance of maintaining oxygen saturations>88%;To learn and demonstrate proper pursed lip breathing techniques or other breathing techniques. ;To learn and demonstrate proper use of respiratory medications    Long  Term Goals Exhibits compliance with exercise, home  and travel O2 prescription;Verbalizes importance of monitoring SPO2 with pulse oximeter and return demonstration;Maintenance of O2 saturations>88%;Exhibits proper breathing techniques, such as pursed lip breathing or other method taught during program session;Compliance with respiratory medication;Demonstrates proper use of MDI's             Oxygen Re-Evaluation:  Oxygen Re-Evaluation     Row Name 11/20/21 8185 12/13/21 1549 01/08/22 0742         Program Oxygen Prescription   Program Oxygen Prescription Continuous Continuous Continuous     Liters per minute _0 Home Oxygen   Home Oxygen Device Home  Concentrator;E-Tanks Home Concentrator;E-Tanks Home Concentrator;E-Tanks     Sleep Oxygen Prescription BiPAP BiPAP BiPAP     Liters per minute _1 Home Exercise Oxygen Prescription Continuous Continuous Continuous     Liters per minute _2 Home Resting Oxygen Prescription Continuous Continuous Continuous     Liters per minute _3 Compliance with Home Oxygen Use Yes Yes Yes       Goals/Expected Outcomes   Short Term Goals To learn and exhibit compliance with exercise, home and travel O2 prescription;To learn and understand importance of monitoring SPO2 with pulse oximeter and demonstrate accurate use of the pulse oximeter.;To learn and understand importance of maintaining oxygen saturations>88%;To learn and demonstrate proper pursed lip breathing techniques or other breathing techniques. ;To learn and demonstrate proper use of respiratory medications To learn and exhibit compliance with exercise, home and travel O2 prescription;To learn and understand importance of monitoring SPO2 with pulse oximeter and demonstrate accurate use of the pulse oximeter.;To learn and understand importance of maintaining oxygen saturations>88%;To learn and demonstrate proper pursed lip breathing techniques or other breathing techniques. ;To learn and demonstrate proper use of respiratory medications To learn and exhibit compliance with exercise, home and travel O2 prescription;To learn and understand importance of monitoring SPO2 with pulse oximeter and demonstrate accurate use of the pulse oximeter.;To learn and understand importance of maintaining oxygen saturations>88%;To learn and demonstrate proper pursed lip breathing techniques or other breathing techniques. ;To learn and demonstrate proper use of respiratory medications     Long  Term Goals Exhibits compliance with exercise, home  and travel O2 prescription;Verbalizes importance of monitoring SPO2 with pulse oximeter and return  demonstration;Maintenance of O2 saturations>88%;Exhibits proper breathing techniques, such as pursed lip breathing or other method taught during program session;Compliance with respiratory medication;Demonstrates proper use of MDI's Exhibits compliance with exercise, home  and travel O2 prescription;Verbalizes importance of monitoring SPO2 with pulse oximeter and return demonstration;Maintenance of O2 saturations>88%;Exhibits proper breathing techniques, such as pursed lip breathing or other method taught during program session;Compliance with respiratory medication;Demonstrates proper  use of MDI's Exhibits compliance with exercise, home  and travel O2 prescription;Verbalizes importance of monitoring SPO2 with pulse oximeter and return demonstration;Maintenance of O2 saturations>88%;Exhibits proper breathing techniques, such as pursed lip breathing or other method taught during program session;Compliance with respiratory medication;Demonstrates proper use of MDI's     Goals/Expected Outcomes Compliance and understanding of oxygen saturation monitoring and breathing techniques to decrease shortness of breath. Compliance and understanding of oxygen saturation monitoring and breathing techniques to decrease shortness of breath. Compliance and understanding of oxygen saturation monitoring and breathing techniques to decrease shortness of breath.              Oxygen Discharge (Final Oxygen Re-Evaluation):  Oxygen Re-Evaluation - 01/08/22 0742       Program Oxygen Prescription   Program Oxygen Prescription Continuous    Liters per minute 3      Home Oxygen   Home Oxygen Device Home Concentrator;E-Tanks    Sleep Oxygen Prescription BiPAP    Liters per minute 2    Home Exercise Oxygen Prescription Continuous    Liters per minute 3    Home Resting Oxygen Prescription Continuous    Liters per minute 2    Compliance with Home Oxygen Use Yes      Goals/Expected Outcomes   Short Term Goals To learn  and exhibit compliance with exercise, home and travel O2 prescription;To learn and understand importance of monitoring SPO2 with pulse oximeter and demonstrate accurate use of the pulse oximeter.;To learn and understand importance of maintaining oxygen saturations>88%;To learn and demonstrate proper pursed lip breathing techniques or other breathing techniques. ;To learn and demonstrate proper use of respiratory medications    Long  Term Goals Exhibits compliance with exercise, home  and travel O2 prescription;Verbalizes importance of monitoring SPO2 with pulse oximeter and return demonstration;Maintenance of O2 saturations>88%;Exhibits proper breathing techniques, such as pursed lip breathing or other method taught during program session;Compliance with respiratory medication;Demonstrates proper use of MDI's    Goals/Expected Outcomes Compliance and understanding of oxygen saturation monitoring and breathing techniques to decrease shortness of breath.             Initial Exercise Prescription:   Perform Capillary Blood Glucose checks as needed.  Exercise Prescription Changes:   Exercise Prescription Changes     Row Name 11/27/21 1500 12/11/21 1500 12/25/21 1500 01/03/22 1521       Response to Exercise   Blood Pressure (Admit) 130/80 130/76 138/82 148/84    Blood Pressure (Exercise) 144/80 -- 138/70 --    Blood Pressure (Exit) 114/72 116/64 138/80 128/84    Heart Rate (Admit) 102 bpm 102 bpm 101 bpm 96 bpm    Heart Rate (Exercise) 120 bpm 118 bpm 121 bpm 122 bpm    Heart Rate (Exit) 93 bpm 101 bpm 100 bpm 110 bpm    Oxygen Saturation (Admit) 95 % 90 % 92 % 93 %    Oxygen Saturation (Exercise) 94 % 92 % 92 % 92 %    Oxygen Saturation (Exit) 97 % 92 % 95 % 94 %    Rating of Perceived Exertion (Exercise) _0 Perceived Dyspnea (Exercise) _1 Duration Progress to 30 minutes of  aerobic without signs/symptoms of physical distress Progress to 30 minutes of  aerobic without  signs/symptoms of physical distress Continue with 30 min of aerobic exercise without signs/symptoms of physical distress. Continue with 30 min of aerobic exercise without signs/symptoms of physical distress.  Intensity THRR unchanged THRR unchanged THRR unchanged THRR unchanged      Progression   Progression Continue to progress workloads to maintain intensity without signs/symptoms of physical distress. Continue to progress workloads to maintain intensity without signs/symptoms of physical distress. Continue to progress workloads to maintain intensity without signs/symptoms of physical distress. Continue to progress workloads to maintain intensity without signs/symptoms of physical distress.      Resistance Training   Weight red bands Red bands Red bands red bands    Reps 10-15 10-15 10-15 10-15    Time 10 Minutes 10 Minutes 10 Minutes 10 Minutes      Oxygen   Oxygen Continuous Continuous Continuous Continuous    Liters _0 Recumbant Bike   Level 1 -- -- --    Minutes 15 -- -- --      NuStep   Level _1 SPM 70 70 70 70    Minutes _2 METs -- 1.9 2.1 2.5      Arm/Foot Ergometer   Level -- 1 1.5 1.5    Minutes -- _3 METs -- 1.6 1.7 1.9      Oxygen   Maintain Oxygen Saturation 88% or higher -- -- --             Exercise Comments:   Exercise Comments     Row Name 11/27/21 1526           Exercise Comments Pt completed first day of exercise. She exercised for 15 min on the recumbent bike and Nustep. METs and/or watts were not seen. She performed the warmup and cooldown mostly standing and sat when she was short of breath.                Exercise Goals and Review:   Exercise Goals     Row Name 11/14/21 1226 11/20/21 0806 12/13/21 1549 01/08/22 0742       Exercise Goals   Increase Physical Activity Yes Yes Yes Yes    Intervention Provide advice, education, support and counseling about physical activity/exercise  needs.;Develop an individualized exercise prescription for aerobic and resistive training based on initial evaluation findings, risk stratification, comorbidities and participant's personal goals. Provide advice, education, support and counseling about physical activity/exercise needs.;Develop an individualized exercise prescription for aerobic and resistive training based on initial evaluation findings, risk stratification, comorbidities and participant's personal goals. Provide advice, education, support and counseling about physical activity/exercise needs.;Develop an individualized exercise prescription for aerobic and resistive training based on initial evaluation findings, risk stratification, comorbidities and participant's personal goals. Provide advice, education, support and counseling about physical activity/exercise needs.;Develop an individualized exercise prescription for aerobic and resistive training based on initial evaluation findings, risk stratification, comorbidities and participant's personal goals.    Expected Outcomes Short Term: Attend rehab on a regular basis to increase amount of physical activity.;Long Term: Add in home exercise to make exercise part of routine and to increase amount of physical activity.;Long Term: Exercising regularly at least 3-5 days a week. Short Term: Attend rehab on a regular basis to increase amount of physical activity.;Long Term: Add in home exercise to make exercise part of routine and to increase amount of physical activity.;Long Term: Exercising regularly at least 3-5 days a week. Short Term: Attend rehab on a regular basis to increase amount of physical activity.;Long Term: Add in home exercise to make exercise  part of routine and to increase amount of physical activity.;Long Term: Exercising regularly at least 3-5 days a week. Short Term: Attend rehab on a regular basis to increase amount of physical activity.;Long Term: Add in home exercise to make  exercise part of routine and to increase amount of physical activity.;Long Term: Exercising regularly at least 3-5 days a week.    Increase Strength and Stamina Yes Yes Yes Yes    Intervention Provide advice, education, support and counseling about physical activity/exercise needs.;Develop an individualized exercise prescription for aerobic and resistive training based on initial evaluation findings, risk stratification, comorbidities and participant's personal goals. Provide advice, education, support and counseling about physical activity/exercise needs.;Develop an individualized exercise prescription for aerobic and resistive training based on initial evaluation findings, risk stratification, comorbidities and participant's personal goals. Provide advice, education, support and counseling about physical activity/exercise needs.;Develop an individualized exercise prescription for aerobic and resistive training based on initial evaluation findings, risk stratification, comorbidities and participant's personal goals. Provide advice, education, support and counseling about physical activity/exercise needs.;Develop an individualized exercise prescription for aerobic and resistive training based on initial evaluation findings, risk stratification, comorbidities and participant's personal goals.    Expected Outcomes Short Term: Increase workloads from initial exercise prescription for resistance, speed, and METs.;Short Term: Perform resistance training exercises routinely during rehab and add in resistance training at home;Long Term: Improve cardiorespiratory fitness, muscular endurance and strength as measured by increased METs and functional capacity (6MWT) Short Term: Increase workloads from initial exercise prescription for resistance, speed, and METs.;Short Term: Perform resistance training exercises routinely during rehab and add in resistance training at home;Long Term: Improve cardiorespiratory fitness,  muscular endurance and strength as measured by increased METs and functional capacity (6MWT) Short Term: Increase workloads from initial exercise prescription for resistance, speed, and METs.;Short Term: Perform resistance training exercises routinely during rehab and add in resistance training at home;Long Term: Improve cardiorespiratory fitness, muscular endurance and strength as measured by increased METs and functional capacity (6MWT) Short Term: Increase workloads from initial exercise prescription for resistance, speed, and METs.;Short Term: Perform resistance training exercises routinely during rehab and add in resistance training at home;Long Term: Improve cardiorespiratory fitness, muscular endurance and strength as measured by increased METs and functional capacity (6MWT)    Able to understand and use rate of perceived exertion (RPE) scale Yes Yes Yes Yes    Intervention Provide education and explanation on how to use RPE scale Provide education and explanation on how to use RPE scale Provide education and explanation on how to use RPE scale Provide education and explanation on how to use RPE scale    Expected Outcomes Short Term: Able to use RPE daily in rehab to express subjective intensity level;Long Term:  Able to use RPE to guide intensity level when exercising independently Short Term: Able to use RPE daily in rehab to express subjective intensity level;Long Term:  Able to use RPE to guide intensity level when exercising independently Short Term: Able to use RPE daily in rehab to express subjective intensity level;Long Term:  Able to use RPE to guide intensity level when exercising independently Short Term: Able to use RPE daily in rehab to express subjective intensity level;Long Term:  Able to use RPE to guide intensity level when exercising independently    Able to understand and use Dyspnea scale Yes Yes Yes Yes    Intervention Provide education and explanation on how to use Dyspnea scale  Provide education and explanation on how to use Dyspnea scale  Provide education and explanation on how to use Dyspnea scale Provide education and explanation on how to use Dyspnea scale    Expected Outcomes Short Term: Able to use Dyspnea scale daily in rehab to express subjective sense of shortness of breath during exertion;Long Term: Able to use Dyspnea scale to guide intensity level when exercising independently Short Term: Able to use Dyspnea scale daily in rehab to express subjective sense of shortness of breath during exertion;Long Term: Able to use Dyspnea scale to guide intensity level when exercising independently Short Term: Able to use Dyspnea scale daily in rehab to express subjective sense of shortness of breath during exertion;Long Term: Able to use Dyspnea scale to guide intensity level when exercising independently Short Term: Able to use Dyspnea scale daily in rehab to express subjective sense of shortness of breath during exertion;Long Term: Able to use Dyspnea scale to guide intensity level when exercising independently    Knowledge and understanding of Target Heart Rate Range (THRR) Yes Yes Yes Yes    Intervention Provide education and explanation of THRR including how the numbers were predicted and where they are located for reference Provide education and explanation of THRR including how the numbers were predicted and where they are located for reference Provide education and explanation of THRR including how the numbers were predicted and where they are located for reference Provide education and explanation of THRR including how the numbers were predicted and where they are located for reference    Expected Outcomes Short Term: Able to state/look up THRR;Long Term: Able to use THRR to govern intensity when exercising independently;Short Term: Able to use daily as guideline for intensity in rehab Short Term: Able to state/look up THRR;Long Term: Able to use THRR to govern intensity when  exercising independently;Short Term: Able to use daily as guideline for intensity in rehab Short Term: Able to state/look up THRR;Long Term: Able to use THRR to govern intensity when exercising independently;Short Term: Able to use daily as guideline for intensity in rehab Short Term: Able to state/look up THRR;Long Term: Able to use THRR to govern intensity when exercising independently;Short Term: Able to use daily as guideline for intensity in rehab    Understanding of Exercise Prescription Yes Yes Yes Yes    Intervention Provide education, explanation, and written materials on patient's individual exercise prescription Provide education, explanation, and written materials on patient's individual exercise prescription Provide education, explanation, and written materials on patient's individual exercise prescription Provide education, explanation, and written materials on patient's individual exercise prescription    Expected Outcomes Short Term: Able to explain program exercise prescription;Long Term: Able to explain home exercise prescription to exercise independently Short Term: Able to explain program exercise prescription;Long Term: Able to explain home exercise prescription to exercise independently Short Term: Able to explain program exercise prescription;Long Term: Able to explain home exercise prescription to exercise independently Short Term: Able to explain program exercise prescription;Long Term: Able to explain home exercise prescription to exercise independently             Exercise Goals Re-Evaluation :  Exercise Goals Re-Evaluation     Row Name 11/20/21 0806 12/13/21 1549 01/08/22 0742         Exercise Goal Re-Evaluation   Exercise Goals Review Increase Physical Activity;Increase Strength and Stamina;Able to understand and use rate of perceived exertion (RPE) scale;Able to understand and use Dyspnea scale;Knowledge and understanding of Target Heart Rate Range  (THRR);Understanding of Exercise Prescription Increase Physical Activity;Increase Strength and Stamina;Able to  understand and use rate of perceived exertion (RPE) scale;Able to understand and use Dyspnea scale;Knowledge and understanding of Target Heart Rate Range (THRR);Understanding of Exercise Prescription Increase Physical Activity;Increase Strength and Stamina;Able to understand and use rate of perceived exertion (RPE) scale;Able to understand and use Dyspnea scale;Knowledge and understanding of Target Heart Rate Range (THRR);Understanding of Exercise Prescription     Comments Shannon is scheduled to begin exercise this week. Will continue to monitor and progress as able. Adlee has completed 4 exercise sessions. She exercises for 15 min on the Nustep and arm ergometer. Valory averages 1.8 METs at level 2 on the Nustep and 1.8 METs at level 1 on the arm ergometer. Netanya seem motivated to exercise. is limited by her chronic pain. Will continue to monitor and progress as able. Tashi has completed 10 exercise sessions. She exercises for 15 min on the Nustep and arm ergometer. Breawna averages 2.5 METs at level 4 on the Nustep and 1.9 METs at level 1.5 on the arm ergometer. Channel has increased her workload for both exercise modes and has tolerated the progressions well. She is very motivated to exercicse. I have placed a PREP referral as she is excited to start that program. Will continue to monitor and progress as able.     Expected Outcomes Through exercise at rehab and home, the patient will decrease shortness of breath with daily activities and feel confident in carrying out an exercise regimen at home. Through exercise at rehab and home, the patient will decrease shortness of breath with daily activities and feel confident in carrying out an exercise regimen at home. Through exercise at rehab and home, the patient will decrease shortness of breath with daily activities and feel confident in  carrying out an exercise regimen at home.              Discharge Exercise Prescription (Final Exercise Prescription Changes):  Exercise Prescription Changes - 01/03/22 1521       Response to Exercise   Blood Pressure (Admit) 148/84    Blood Pressure (Exit) 128/84    Heart Rate (Admit) 96 bpm    Heart Rate (Exercise) 122 bpm    Heart Rate (Exit) 110 bpm    Oxygen Saturation (Admit) 93 %    Oxygen Saturation (Exercise) 92 %    Oxygen Saturation (Exit) 94 %    Rating of Perceived Exertion (Exercise) 9    Perceived Dyspnea (Exercise) 1    Duration Continue with 30 min of aerobic exercise without signs/symptoms of physical distress.    Intensity THRR unchanged      Progression   Progression Continue to progress workloads to maintain intensity without signs/symptoms of physical distress.      Resistance Training   Weight red bands    Reps 10-15    Time 10 Minutes      Oxygen   Oxygen Continuous    Liters 3      NuStep   Level 4    SPM 70    Minutes 15    METs 2.5      Arm/Foot Ergometer   Level 1.5    Minutes 15    METs 1.9             Nutrition:  Target Goals: Understanding of nutrition guidelines, daily intake of sodium <1537m, cholesterol <2012m calories 30% from fat and 7% or less from saturated fats, daily to have 5 or more servings of fruits and vegetables.  Biometrics:  Pre Biometrics - 11/14/21  1225       Pre Biometrics   Grip Strength 28 kg              Nutrition Therapy Plan and Nutrition Goals:  Nutrition Therapy & Goals - 01/15/22 1457       Nutrition Therapy   Diet Heart Healthy/Carbohydrate Consistent    Drug/Food Interactions Statins/Certain Fruits      Personal Nutrition Goals   Nutrition Goal Patient to identify and limit food sources of refined carbohydrates, simple sugars, and sugary beverages.    Personal Goal #2 Patient to build a healthy plate including vegetables, fruit, whole grains, low fat dairy, lean  protein/plant protein as part of a heart healthy meal plan.    Comments Patient admits inconsistencies in diet due to life stress; she remains motivated to work toward weight loss and dietary changes even upon graduation of pulmonary rehab.      Intervention Plan   Intervention Prescribe, educate and counsel regarding individualized specific dietary modifications aiming towards targeted core components such as weight, hypertension, lipid management, diabetes, heart failure and other comorbidities.;Nutrition handout(s) given to patient.    Expected Outcomes Short Term Goal: Understand basic principles of dietary content, such as calories, fat, sodium, cholesterol and nutrients.;Short Term Goal: A plan has been developed with personal nutrition goals set during dietitian appointment.;Long Term Goal: Adherence to prescribed nutrition plan.             Nutrition Assessments:  Nutrition Assessments - 01/15/22 1500       Rate Your Plate Scores   Post Score 57            MEDIFICTS Score Key: ?70 Need to make dietary changes  40-70 Heart Healthy Diet ? 40 Therapeutic Level Cholesterol Diet  Flowsheet Row PULMONARY REHAB OTHER RESPIRATORY from 01/15/2022 in [facility]  Picture Your Plate Total Score on Discharge 57      Picture Your Plate Scores: <36 Unhealthy dietary pattern with much room for improvement. 41-50 Dietary pattern unlikely to meet recommendations for good health and room for improvement. 51-60 More healthful dietary pattern, with some room for improvement.  >60 Healthy dietary pattern, although there may be some specific behaviors that could be improved.    Nutrition Goals Re-Evaluation:  Nutrition Goals Re-Evaluation     [place] Name 01/15/22 1457             Goals   Current Weight 232 lb 2.3 oz (105.3 kg)       Expected Outcome Patient continues contemplative stage of change. Patient admits inconsistencies in diet due to life  stress; she remains motivated to work toward weight loss and dietary changes even upon graduation of pulmonary rehab.                Nutrition Goals Discharge (Final Nutrition Goals Re-Evaluation):  Nutrition Goals Re-Evaluation - 01/15/22 1457       Goals   Current Weight 232 lb 2.3 oz (105.3 kg)    Expected Outcome Patient continues contemplative stage of change. Patient admits inconsistencies in diet due to life stress; she remains motivated to work toward weight loss and dietary changes even upon graduation of pulmonary rehab.             Psychosocial: Target Goals: Acknowledge presence or absence of significant depression and/or stress, maximize coping skills, provide positive support system. Participant is able to verbalize types and ability to use techniques and skills needed for reducing stress and depression.  Initial Review & Psychosocial Screening:  Initial Psych Review & Screening - 11/14/21 1404       Initial Review   Current issues with Current Depression   Media states that her depression come from having to be on oxygen. 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 She does not want to have to depend on others.     Family Dynamics   Good Support System? Yes   Her son and daughter     Barriers   Psychosocial barriers to participate in program Psychosocial barriers identified (see note)   We will assist pt with obtaining a POC as she request. She has been working with [employer] and she has also called [employer].     Screening Interventions   Interventions Encouraged to exercise;Provide feedback about the scores to participant;To provide support and resources with identified psychosocial needs    Expected Outcomes Short Term goal: Utilizing psychosocial counselor, staff and physician to assist with identification of specific Stressors or current issues interfering with healing process. Setting desired goal for  each stressor or current issue identified.;Long Term Goal: Stressors or current issues are controlled or eliminated.;Short Term goal: Identification and review with participant of any Quality of Life or Depression concerns found by scoring the questionnaire.;Long Term goal: The participant improves quality of Life and PHQ9 Scores as seen by post scores and/or verbalization of changes             Quality of Life Scores:  Scores of 19 and below usually indicate a poorer quality of life in these areas.  A difference of  2-3 points is a clinically meaningful difference.  A difference of 2-3 points in the total score of the Quality of Life Index has been associated with significant improvement in overall quality of life, self-image, physical symptoms, and general health in studies assessing change in quality of life.  PHQ-9: Review Flowsheet       11/14/2021 08/27/2021  Depression screen PHQ 2/9  Decreased Interest 1 0  Down, Depressed, Hopeless 1 0  PHQ - 2 Score 2 0  Altered sleeping 1 -  Tired, decreased energy 1 -  Change in appetite 1 -  Feeling bad or failure about yourself  2 -  Trouble concentrating 1 -  Moving slowly or fidgety/restless 0 -  Suicidal thoughts 1 -  PHQ-9 Score 9 -  Difficult doing work/chores Somewhat difficult -   Interpretation of Total Score  Total Score Depression Severity:  1-4 = Minimal depression, 5-9 = Mild depression, 10-14 = Moderate depression, 15-19 = Moderately severe depression, 20-27 = Severe depression   Psychosocial Evaluation and Intervention:   Psychosocial Re-Evaluation:  Psychosocial Re-Evaluation     [place] Name 11/20/21 4944 12/13/21 1552 01/08/22 0912         Psychosocial Re-Evaluation   Current issues with Current Depression;History of Depression Current Depression;History of Depression Current Depression     Comments Is presently on Prozac for depression, she could benefit from therapy and is struggling with weight loss.  She has  come to orientation, but due to right hand and arm pain she has not begun exercising in pulmonary rehab. Valynn's depression is stable on current treatment, Prozac.  She was very excited to take a trip with her daughter to [facility] and had a great time, but was exhausted upon her return and it took her 1 week to recover. Glenola remains very positive, works  hard when she is exercising and is proud of her progress.  No signs of depression at this time.     Expected Outcomes For Latisia to improve her PHQ scores and be able to stabilize her depression. For Chela to continue to be free of barriers or psychosocial concerns while participating in pulmonary rehab. --     Interventions Relaxation education;Encouraged to attend Pulmonary Rehabilitation for the exercise;Stress management education;Therapist referral Encouraged to attend Pulmonary Rehabilitation for the exercise;Stress management education;Relaxation education --     Continue Psychosocial Services  Follow up required by staff Follow up required by staff --       Initial Review   Source of Stress Concerns -- Chronic Illness;Unable to participate in former interests or hobbies;Unable to perform yard/household activities --     Comments -- She is learning to live with supplemental oxygen and becoming more accepting of it. --              Psychosocial Discharge (Final Psychosocial Re-Evaluation):  Psychosocial Re-Evaluation - 01/08/22 0912       Psychosocial Re-Evaluation   Current issues with Current Depression    Comments Deb remains very positive, works hard when she is exercising and is proud of her progress.  No signs of depression at this time.             Education: Education Goals: Education classes will be provided on a weekly basis, covering required topics. Participant will state understanding/return demonstration of topics presented.  Learning Barriers/Preferences:   Education Topics: Risk Factor  Reduction:  -Group instruction that is supported by a PowerPoint presentation. Instructor discusses the definition of a risk factor, different risk factors for pulmonary disease, and how the heart and lungs work together.     Nutrition for Pulmonary Patient:  -Group instruction provided by PowerPoint slides, verbal discussion, and written materials to support subject matter. The instructor gives an explanation and review of healthy diet recommendations, which includes a discussion on weight management, recommendations for fruit and vegetable consumption, as well as protein, fluid, caffeine, fiber, sodium, sugar, and alcohol. Tips for eating when patients are short of breath are discussed.   Pursed Lip Breathing:  -Group instruction that is supported by demonstration and informational handouts. Instructor discusses the benefits of pursed lip and diaphragmatic breathing and detailed demonstration on how to preform both.     Oxygen Safety:  -Group instruction provided by PowerPoint, verbal discussion, and written material to support subject matter. There is an overview of "What is Oxygen" and "Why do we need it".  Instructor also reviews how to create a safe environment for oxygen use, the importance of using oxygen as prescribed, and the risks of noncompliance. There is a brief discussion on traveling with oxygen and resources the patient may utilize. Flowsheet Row PULMONARY REHAB OTHER RESPIRATORY from 01/10/2022 in [facility]  Date 01/03/22  Educator Sam  [Handout]  Instruction Review Code 1- Verbalizes Understanding       Oxygen Equipment:  -Group instruction provided by [employer] Staff utilizing handouts, written materials, and equipment demonstrations. Flowsheet Row PULMONARY REHAB OTHER RESPIRATORY from 01/10/2022 in [facility]  Date 12/27/21  Educator Donnetta Simpers  Instruction Review Code 1- Verbalizes Understanding        Signs and Symptoms:  -Group instruction provided by written material and verbal discussion to support subject matter. Warning signs and symptoms of infection, stroke, and heart attack are reviewed and when to call  the physician/911 reinforced. Tips for preventing the spread of infection discussed.   Advanced Directives:  -Group instruction provided by verbal instruction and written material to support subject matter. Instructor reviews Advanced Directive laws and proper instruction for filling out document.   Pulmonary Video:  -Group video education that reviews the importance of medication and oxygen compliance, exercise, good nutrition, pulmonary hygiene, and pursed lip and diaphragmatic breathing for the pulmonary patient.   Exercise for the Pulmonary Patient:  -Group instruction that is supported by a PowerPoint presentation. Instructor discusses benefits of exercise, core components of exercise, frequency, duration, and intensity of an exercise routine, importance of utilizing pulse oximetry during exercise, safety while exercising, and options of places to exercise outside of rehab.   Flowsheet Row PULMONARY REHAB OTHER RESPIRATORY from 01/10/2022 in [facility]  Date 12/13/21  [facility] Levels]  Educator Donnetta Simpers  Instruction Review Code 1- Verbalizes Understanding       Pulmonary Medications:  -Verbally interactive group education provided by instructor with focus on inhaled medications and proper administration.   Anatomy and Physiology of the Respiratory System and Intimacy:  -Group instruction provided by PowerPoint, verbal discussion, and written material to support subject matter. Instructor reviews respiratory cycle and anatomical components of the respiratory system and their functions. Instructor also reviews differences in obstructive and restrictive respiratory diseases with examples of each. Intimacy, Sex, and Sexuality differences are  reviewed with a discussion on how relationships can change when diagnosed with pulmonary disease. Common sexual concerns are reviewed. Flowsheet Row PULMONARY REHAB OTHER RESPIRATORY from 01/10/2022 in [place]  Date 12/20/21  Educator Donnetta Simpers  Instruction Review Code 1- Rosezena Sensor Understanding       MD DAY -A group question and answer session with a medical doctor that allows participants to ask questions that relate to their pulmonary disease state.   OTHER EDUCATION -Group or individual verbal, written, or video instructions that support the educational goals of the pulmonary rehab program.   Holiday Eating Survival Tips:  -Group instruction provided by PowerPoint slides, verbal discussion, and written materials to support subject matter. The instructor gives patients tips, tricks, and techniques to help them not only survive but enjoy the holidays despite the onslaught of food that accompanies the holidays.   Knowledge Questionnaire Score:  Knowledge Questionnaire Score - 01/15/22 1622       Knowledge Questionnaire Score   Post Score 17/18             Core Components/Risk Factors/Patient Goals at Admission:  Personal Goals and Risk Factors at Admission - 11/14/21 1414       Core Components/Risk Factors/Patient Goals on Admission    Weight Management Yes;Obesity;Weight Loss    Intervention Weight Management: Develop a combined nutrition and exercise program designed to reach desired caloric intake, while maintaining appropriate intake of nutrient and fiber, sodium and fats, and appropriate energy expenditure required for the weight goal.;Weight Management: Provide education and appropriate resources to help participant work on and attain dietary goals.;Weight Management/Obesity: Establish reasonable short term and long term weight goals.;Obesity: Provide education and appropriate resources to help participant work on and attain dietary  goals.    Admit Weight 228 lb 2.8 oz (103.5 kg)    Improve shortness of breath with ADL's Yes    Intervention Provide education, individualized exercise plan and daily activity instruction to help decrease symptoms of SOB with activities of daily living.    Expected Outcomes Short Term: Improve cardiorespiratory  fitness to achieve a reduction of symptoms when performing ADLs;Long Term: Be able to perform more ADLs without symptoms or delay the onset of symptoms    Increase knowledge of respiratory medications and ability to use respiratory devices properly  Yes    Intervention Provide education and demonstration as needed of appropriate use of medications, inhalers, and oxygen therapy.    Expected Outcomes Short Term: Achieves understanding of medications use. Understands that oxygen is a medication prescribed by physician. Demonstrates appropriate use of inhaler and oxygen therapy.;Long Term: Maintain appropriate use of medications, inhalers, and oxygen therapy.             Core Components/Risk Factors/Patient Goals Review:   Goals and Risk Factor Review     Row Name 11/20/21 0944 12/13/21 1555 12/13/21 1608 01/08/22 0916       Core Components/Risk Factors/Patient Goals Review   Personal Goals Review Weight Management/Obesity;Develop more efficient breathing techniques such as purse lipped breathing and diaphragmatic breathing and practicing self-pacing with activity.;Increase knowledge of respiratory medications and ability to use respiratory devices properly.;Improve shortness of breath with ADL's Weight Management/Obesity;Develop more efficient breathing techniques such as purse lipped breathing and diaphragmatic breathing and practicing self-pacing with activity.;Increase knowledge of respiratory medications and ability to use respiratory devices properly.;Improve shortness of breath with ADL's -- Weight Management/Obesity;Improve shortness of breath with ADL's;Increase knowledge of  respiratory medications and ability to use respiratory devices properly.;Develop more efficient breathing techniques such as purse lipped breathing and diaphragmatic breathing and practicing self-pacing with activity.    Review Marina has not started exercising in pulmonary rahab due to right hand and arm pain Kymberly has attended 4 exercise sessions, her CBG's have been WNL pre and post exercise.  She seems to enjoy exercising and is slow to progress due to her 2 week absence.  She essentially is restarting her exercise regime.  She is exercising at 1.8 mets on the arm ergomenter and 1.8 mets on the nustep. Also Meyer has not lost any weight since starting the program. Christain has not been successful losing weight, she has progressed to 2.5 mets on the nustep and 1.9 mets on the arm ergomenter.  She will continue exercising at [facility] after graduating from pulmonary rehab.    Expected Outcomes See admission goals. See admission goals. -- See admission goals.             Core Components/Risk Factors/Patient Goals at Discharge (Final Review):   Goals and Risk Factor Review - 01/08/22 0916       Core Components/Risk Factors/Patient Goals Review   Personal Goals Review Weight Management/Obesity;Improve shortness of breath with ADL's;Increase knowledge of respiratory medications and ability to use respiratory devices properly.;Develop more efficient breathing techniques such as purse lipped breathing and diaphragmatic breathing and practicing self-pacing with activity.    Review Vikkie has not been successful losing weight, she has progressed to 2.5 mets on the nustep and 1.9 mets on the arm ergomenter.  She will continue [facility] at [facility] after graduating from pulmonary rehab.    Expected Outcomes See admission goals.             ITP Comments: Pt is making expected progress toward Pulmonary Rehab goals after completing 12 sessions. Recommend continued exercise,  life style modification, education, and utilization of breathing techniques to increase stamina and strength, while also decreasing shortness of breath with exertion.  Dr. Rodman Pickle is Medical Director for Pulmonary Rehab at [facility].

## 2022-01-17 ENCOUNTER — Encounter (HOSPITAL_COMMUNITY)
Admission: RE | Admit: 2022-01-17 | Discharge: 2022-01-17 | Disposition: A | Discharge status: Home / Self Care | Payer: Medicare HMO | Source: Ambulatory Visit | Attending: Pulmonary Disease | Admitting: Pulmonary Disease

## 2022-01-17 DIAGNOSIS — E119 Type 2 diabetes mellitus without complications: Secondary | ICD-10-CM

## 2022-01-17 DIAGNOSIS — E1142 Type 2 diabetes mellitus with diabetic polyneuropathy: Secondary | ICD-10-CM | POA: Diagnosis not present

## 2022-01-17 DIAGNOSIS — D860 Sarcoidosis of lung: Secondary | ICD-10-CM

## 2022-01-17 DIAGNOSIS — I10 Essential (primary) hypertension: Secondary | ICD-10-CM | POA: Diagnosis not present

## 2022-01-17 DIAGNOSIS — J302 Other seasonal allergic rhinitis: Secondary | ICD-10-CM | POA: Diagnosis not present

## 2022-01-17 DIAGNOSIS — J9611 Chronic respiratory failure with hypoxia: Secondary | ICD-10-CM | POA: Diagnosis not present

## 2022-01-17 DIAGNOSIS — Z794 Long term (current) use of insulin: Secondary | ICD-10-CM | POA: Diagnosis not present

## 2022-01-17 DIAGNOSIS — J4520 Mild intermittent asthma, uncomplicated: Secondary | ICD-10-CM | POA: Diagnosis not present

## 2022-01-17 NOTE — Progress Notes (Signed)
Discharge Progress Report  Patient Details  Name: Cassandra Burns MRN: [id number] Date of Birth: 30-Oct-1965 Referring Provider:     Number of Visits: 13  Reason for Discharge:  Patient has met program and personal goals.  Smoking History:  Social History   Tobacco Use  Smoking Status Former   Types: Cigars   Quit date: 06/30/2017   Years since quitting: 4.5  Smokeless Tobacco Never  Tobacco Comments   Smokes Marijuana ("here and there") (as needed)    Diagnosis:  Pulmonary sarcoidosis ([place])  Type 2 diabetes mellitus without complication, with long-term current use of insulin ([place])  ADL [facility]:  Pulmonary Assessment Scores     Row Name 11/14/21 1440 01/15/22 1623       ADL [facility]   ADL Phase Entry Exit    SOB Score total 64 76      CAT Score   CAT Score 19 19      mMRC Score   mMRC Score 4 --             Initial Exercise Prescription:   Discharge Exercise Prescription (Final Exercise Prescription Changes):  Exercise Prescription Changes - 01/03/22 1521       Response to Exercise   Blood Pressure (Admit) 148/84    Blood Pressure (Exit) 128/84    Heart Rate (Admit) 96 bpm    Heart Rate (Exercise) 122 bpm    Heart Rate (Exit) 110 bpm    Oxygen Saturation (Admit) 93 %    Oxygen Saturation (Exercise) 92 %    Oxygen Saturation (Exit) 94 %    Rating of Perceived Exertion (Exercise) 9    Perceived Dyspnea (Exercise) 1    Duration Continue with 30 min of aerobic exercise without signs/symptoms of physical distress.    Intensity THRR unchanged      Progression   Progression Continue to progress workloads to maintain intensity without signs/symptoms of physical distress.      Resistance Training   Weight red bands    Reps 10-15    Time 10 Minutes      Oxygen   Oxygen Continuous    Liters 3      NuStep   Level 4    SPM 70    Minutes 15    METs 2.5      Arm/Foot Ergometer   Level 1.5    Minutes 15    METs 1.9              Functional Capacity:  6 Minute Walk     Row Name 11/14/21 1216 01/17/22 1508       6 Minute Walk   Phase Initial Discharge    Distance 800 feet 760 feet    Distance % Change -- -5 %    Distance Feet Change -- -40 ft    Walk Time 6 minutes 6 minutes    # of Rest Breaks 1  2:45-3:10 1    MPH 1.52 1.44    METS 4.37 2.66    RPE 11 11    Perceived Dyspnea  2 2    VO2 Peak 15.31 9.31    Symptoms No Yes (comment)    Comments -- rested due to back pain    Resting HR 110 bpm 106 bpm    Resting BP 110/70 150/84    Resting Oxygen Saturation  93 % 93 %    Exercise Oxygen Saturation  during 6 min walk 88 % 89 %    Max  Ex. HR 145 bpm 155 bpm    Max Ex. BP 148/70 172/84    2 Minute Post BP 128/70 166/76      Interval HR   1 Minute HR 124 128    2 Minute HR 130 141    3 Minute HR 128 150    4 Minute HR 132 137    5 Minute HR 141 138    6 Minute HR 145 155    2 Minute Post HR 109 124    Interval Heart Rate? Yes Yes      Interval Oxygen   Interval Oxygen? Yes Yes    Baseline Oxygen Saturation % 93 % 93 %    1 Minute Oxygen Saturation % 93 % 96 %    1 Minute Liters of Oxygen 3 L 6 L    2 Minute Oxygen Saturation % 89 % 91 %    2 Minute Liters of Oxygen 3 L 6 L    3 Minute Oxygen Saturation % 88 % 90 %    3 Minute Liters of Oxygen 3 L 6 L    4 Minute Oxygen Saturation % 90 % 92 %    4 Minute Liters of Oxygen 3 L 6 L    5 Minute Oxygen Saturation % 88 % 93 %    5 Minute Liters of Oxygen 3 L 6 L    6 Minute Oxygen Saturation % 88 % 89 %    6 Minute Liters of Oxygen 3 L 6 L    2 Minute Post Oxygen Saturation % 98 % 98 %    2 Minute Post Liters of Oxygen 3 L 6 L             Psychological, QOL, Others - Outcomes: PHQ 2/9:    01/17/2022    2:52 PM 11/14/2021   12:06 PM 08/27/2021    9:41 AM  Depression screen PHQ 2/9  Decreased Interest 0 1 0  Down, Depressed, Hopeless 0 1 0  PHQ - 2 Score 0 2 0  Altered sleeping 0 1   Tired, decreased energy 0 1   Change in  appetite 0 1   Feeling bad or failure about yourself  0 2   Trouble concentrating 0 1   Moving slowly or fidgety/restless 0 0   Suicidal thoughts 0 1   PHQ-9 Score 0 9   Difficult doing work/chores  Somewhat difficult     Quality of Life:   Personal Goals: Goals established at orientation with interventions provided to work toward goal.  Personal Goals and Risk Factors at Admission - 11/14/21 1414       Core Components/Risk Factors/Patient Goals on Admission    Weight Management Yes;Obesity;Weight Loss    Intervention Weight Management: Develop a combined nutrition and exercise program designed to reach desired caloric intake, while maintaining appropriate intake of nutrient and fiber, sodium and fats, and appropriate energy expenditure required for the weight goal.;Weight Management: Provide education and appropriate resources to help participant work on and attain dietary goals.;Weight Management/Obesity: Establish reasonable short term and long term weight goals.;Obesity: Provide education and appropriate resources to help participant work on and attain dietary goals.    Admit Weight 228 lb 2.8 oz (103.5 kg)    Improve shortness of breath with ADL's Yes    Intervention Provide education, individualized exercise plan and daily activity instruction to help decrease symptoms of SOB with activities of daily living.    Expected Outcomes Short Term:  Improve cardiorespiratory fitness to achieve a reduction of symptoms when performing ADLs;Long Term: Be able to perform more ADLs without symptoms or delay the onset of symptoms    Increase knowledge of respiratory medications and ability to use respiratory devices properly  Yes    Intervention Provide education and demonstration as needed of appropriate use of medications, inhalers, and oxygen therapy.    Expected Outcomes Short Term: Achieves understanding of medications use. Understands that oxygen is a medication prescribed by physician.  Demonstrates appropriate use of inhaler and oxygen therapy.;Long Term: Maintain appropriate use of medications, inhalers, and oxygen therapy.              Personal Goals Discharge:  Goals and Risk Factor Review     Row Name 11/20/21 0944 12/13/21 1555 12/13/21 1608 01/08/22 0916       Core Components/Risk Factors/Patient Goals Review   Personal Goals Review Weight Management/Obesity;Develop more efficient breathing techniques such as purse lipped breathing and diaphragmatic breathing and practicing self-pacing with activity.;Increase knowledge of respiratory medications and ability to use respiratory devices properly.;Improve shortness of breath with ADL's Weight Management/Obesity;Develop more efficient breathing techniques such as purse lipped breathing and diaphragmatic breathing and practicing self-pacing with activity.;Increase knowledge of respiratory medications and ability to use respiratory devices properly.;Improve shortness of breath with ADL's -- Weight Management/Obesity;Improve shortness of breath with ADL's;Increase knowledge of respiratory medications and ability to use respiratory devices properly.;Develop more efficient breathing techniques such as purse lipped breathing and diaphragmatic breathing and practicing self-pacing with activity.    Review Takeela has not started exercising in pulmonary rahab due to right hand and arm pain Camilla has attended 4 exercise sessions, her CBG's have been WNL pre and post exercise.  She seems to enjoy exercising and is slow to progress due to her 2 week absence.  She essentially is restarting her exercise regime.  She is exercising at 1.8 mets on the arm ergomenter and 1.8 mets on the nustep. Also Daniah has not lost any weight since starting the program. Jalee has not been successful losing weight, she has progressed to 2.5 mets on the nustep and 1.9 mets on the arm ergomenter.  She will [facility] after  graduating from pulmonary rehab.    Expected Outcomes See admission goals. See admission goals. -- See admission goals.             Exercise Goals and Review:  Exercise Goals     Row Name 11/14/21 1226 11/20/21 0806 12/13/21 1549 01/08/22 0742       Exercise Goals   Increase Physical Activity Yes Yes Yes Yes    Intervention Provide advice, education, support and counseling about physical activity/exercise needs.;Develop an individualized exercise prescription for aerobic and resistive training based on initial evaluation findings, risk stratification, comorbidities and participant's personal goals. Provide advice, education, support and counseling about physical activity/exercise needs.;Develop an individualized exercise prescription for aerobic and resistive training based on initial evaluation findings, risk stratification, comorbidities and participant's personal goals. Provide advice, education, support and counseling about physical activity/exercise needs.;Develop an individualized exercise prescription for aerobic and resistive training based on initial evaluation findings, risk stratification, comorbidities and participant's personal goals. Provide advice, education, support and counseling about physical activity/exercise needs.;Develop an individualized exercise prescription for aerobic and resistive training based on initial evaluation findings, risk stratification, comorbidities and participant's personal goals.    Expected Outcomes Short Term: Attend rehab on a regular basis to increase amount of physical activity.;Long Term: Add  in home exercise to make exercise part of routine and to increase amount of physical activity.;Long Term: Exercising regularly at least 3-5 days a week. Short Term: Attend rehab on a regular basis to increase amount of physical activity.;Long Term: Add in home exercise to make exercise part of routine and to increase amount of physical activity.;Long Term:  Exercising regularly at least 3-5 days a week. Short Term: Attend rehab on a regular basis to increase amount of physical activity.;Long Term: Add in home exercise to make exercise part of routine and to increase amount of physical activity.;Long Term: Exercising regularly at least 3-5 days a week. Short Term: Attend rehab on a regular basis to increase amount of physical activity.;Long Term: Add in home exercise to make exercise part of routine and to increase amount of physical activity.;Long Term: Exercising regularly at least 3-5 days a week.    Increase Strength and Stamina Yes Yes Yes Yes    Intervention Provide advice, education, support and counseling about physical activity/exercise needs.;Develop an individualized exercise prescription for aerobic and resistive training based on initial evaluation findings, risk stratification, comorbidities and participant's personal goals. Provide advice, education, support and counseling about physical activity/exercise needs.;Develop an individualized exercise prescription for aerobic and resistive training based on initial evaluation findings, risk stratification, comorbidities and participant's personal goals. Provide advice, education, support and counseling about physical activity/exercise needs.;Develop an individualized exercise prescription for aerobic and resistive training based on initial evaluation findings, risk stratification, comorbidities and participant's personal goals. Provide advice, education, support and counseling about physical activity/exercise needs.;Develop an individualized exercise prescription for aerobic and resistive training based on initial evaluation findings, risk stratification, comorbidities and participant's personal goals.    Expected Outcomes Short Term: Increase workloads from initial exercise prescription for resistance, speed, and METs.;Short Term: Perform resistance training exercises routinely during rehab and add in  resistance training at home;Long Term: Improve cardiorespiratory fitness, muscular endurance and strength as measured by increased METs and functional capacity (6MWT) Short Term: Increase workloads from initial exercise prescription for resistance, speed, and METs.;Short Term: Perform resistance training exercises routinely during rehab and add in resistance training at home;Long Term: Improve cardiorespiratory fitness, muscular endurance and strength as measured by increased METs and functional capacity (6MWT) Short Term: Increase workloads from initial exercise prescription for resistance, speed, and METs.;Short Term: Perform resistance training exercises routinely during rehab and add in resistance training at home;Long Term: Improve cardiorespiratory fitness, muscular endurance and strength as measured by increased METs and functional capacity (6MWT) Short Term: Increase workloads from initial exercise prescription for resistance, speed, and METs.;Short Term: Perform resistance training exercises routinely during rehab and add in resistance training at home;Long Term: Improve cardiorespiratory fitness, muscular endurance and strength as measured by increased METs and functional capacity (6MWT)    Able to understand and use rate of perceived exertion (RPE) scale Yes Yes Yes Yes    Intervention Provide education and explanation on how to use RPE scale Provide education and explanation on how to use RPE scale Provide education and explanation on how to use RPE scale Provide education and explanation on how to use RPE scale    Expected Outcomes Short Term: Able to use RPE daily in rehab to express subjective intensity level;Long Term:  Able to use RPE to guide intensity level when exercising independently Short Term: Able to use RPE daily in rehab to express subjective intensity level;Long Term:  Able to use RPE to guide intensity level when exercising independently Short Term: Able to use  RPE daily in rehab to  express subjective intensity level;Long Term:  Able to use RPE to guide intensity level when exercising independently Short Term: Able to use RPE daily in rehab to express subjective intensity level;Long Term:  Able to use RPE to guide intensity level when exercising independently    Able to understand and use Dyspnea scale Yes Yes Yes Yes    Intervention Provide education and explanation on how to use Dyspnea scale Provide education and explanation on how to use Dyspnea scale Provide education and explanation on how to use Dyspnea scale Provide education and explanation on how to use Dyspnea scale    Expected Outcomes Short Term: Able to use Dyspnea scale daily in rehab to express subjective sense of shortness of breath during exertion;Long Term: Able to use Dyspnea scale to guide intensity level when exercising independently Short Term: Able to use Dyspnea scale daily in rehab to express subjective sense of shortness of breath during exertion;Long Term: Able to use Dyspnea scale to guide intensity level when exercising independently Short Term: Able to use Dyspnea scale daily in rehab to express subjective sense of shortness of breath during exertion;Long Term: Able to use Dyspnea scale to guide intensity level when exercising independently Short Term: Able to use Dyspnea scale daily in rehab to express subjective sense of shortness of breath during exertion;Long Term: Able to use Dyspnea scale to guide intensity level when exercising independently    Knowledge and understanding of Target Heart Rate Range (THRR) Yes Yes Yes Yes    Intervention Provide education and explanation of THRR including how the numbers were predicted and where they are located for reference Provide education and explanation of THRR including how the numbers were predicted and where they are located for reference Provide education and explanation of THRR including how the numbers were predicted and where they are located for reference  Provide education and explanation of THRR including how the numbers were predicted and where they are located for reference    Expected Outcomes Short Term: Able to state/look up THRR;Long Term: Able to use THRR to govern intensity when exercising independently;Short Term: Able to use daily as guideline for intensity in rehab Short Term: Able to state/look up THRR;Long Term: Able to use THRR to govern intensity when exercising independently;Short Term: Able to use daily as guideline for intensity in rehab Short Term: Able to state/look up THRR;Long Term: Able to use THRR to govern intensity when exercising independently;Short Term: Able to use daily as guideline for intensity in rehab Short Term: Able to state/look up THRR;Long Term: Able to use THRR to govern intensity when exercising independently;Short Term: Able to use daily as guideline for intensity in rehab    Understanding of Exercise Prescription Yes Yes Yes Yes    Intervention Provide education, explanation, and written materials on patient's individual exercise prescription Provide education, explanation, and written materials on patient's individual exercise prescription Provide education, explanation, and written materials on patient's individual exercise prescription Provide education, explanation, and written materials on patient's individual exercise prescription    Expected Outcomes Short Term: Able to explain program exercise prescription;Long Term: Able to explain home exercise prescription to exercise independently Short Term: Able to explain program exercise prescription;Long Term: Able to explain home exercise prescription to exercise independently Short Term: Able to explain program exercise prescription;Long Term: Able to explain home exercise prescription to exercise independently Short Term: Able to explain program exercise prescription;Long Term: Able to explain home exercise prescription to exercise independently  Exercise Goals Re-Evaluation:  Exercise Goals Re-Evaluation     Row Name 11/20/21 0806 12/13/21 1549 01/08/22 0742         Exercise Goal Re-Evaluation   Exercise Goals Review Increase Physical Activity;Increase Strength and Stamina;Able to understand and use rate of perceived exertion (RPE) scale;Able to understand and use Dyspnea scale;Knowledge and understanding of Target Heart Rate Range (THRR);Understanding of Exercise Prescription Increase Physical Activity;Increase Strength and Stamina;Able to understand and use rate of perceived exertion (RPE) scale;Able to understand and use Dyspnea scale;Knowledge and understanding of Target Heart Rate Range (THRR);Understanding of Exercise Prescription Increase Physical Activity;Increase Strength and Stamina;Able to understand and use rate of perceived exertion (RPE) scale;Able to understand and use Dyspnea scale;Knowledge and understanding of Target Heart Rate Range (THRR);Understanding of Exercise Prescription     Comments Antanette is scheduled to begin exercise this week. Will continue to monitor and progress as able. Reverie has completed 4 exercise sessions. She exercises for 15 min on the Nustep and arm ergometer. Silvanna averages 1.8 METs at level 2 on the Nustep and 1.8 METs at level 1 on the arm ergometer. Montine seem motivated to exercise. is limited by her chronic pain. Will continue to monitor and progress as able. Tessica has completed 10 exercise sessions. She exercises for 15 min on the Nustep and arm ergometer. [place] averages 2.5 METs at level 4 on the Nustep and 1.9 METs at level 1.5 on the arm ergometer. Jerry has increased her workload for both exercise modes and has tolerated the progressions well. She is very motivated to exercicse. I have placed a PRE[facility] referral as she is excited to start that program. Will continue to monitor and progress as able.     Expected Outcomes Through exercise at rehab and home, the patient will  decrease shortness of breath with daily activities and feel confident in carrying out an exercise regimen at home. Through exercise at rehab and home, the patient will decrease shortness of breath with daily activities and feel confident in carrying out an exercise regimen at home. Through exercise at rehab and home, the patient will decrease shortness of breath with daily activities and feel confident in carrying out an exercise regimen at home.              Nutrition & Weight - Outcomes:  Pre Biometrics - 11/14/21 1225       Pre Biometrics   Grip Strength 28 kg             Post Biometrics - 01/17/22 1520        Post  Biometrics   Grip Strength 25 kg             Nutrition:  Nutrition Therapy & Goals - 01/15/22 1457       Nutrition Therapy   Diet Heart Healthy/Carbohydrate Consistent    Drug/Food Interactions Statins/Certain Fruits      Personal Nutrition Goals   Nutrition Goal Patient to identify and limit food sources of refined carbohydrates, simple sugars, and sugary beverages.    Personal Goal #2 Patient to build a healthy plate including vegetables, fruit, whole grains, low fat dairy, lean protein/plant protein as part of a heart healthy meal plan.    Comments Patient admits inconsistencies in diet due to life stress; she remains motivated to work toward weight loss and dietary changes even upon graduation of pulmonary rehab.      Intervention Plan   Intervention Prescribe, educate and counsel regarding individualized specific dietary modifications aiming  towards targeted core components such as weight, hypertension, lipid management, diabetes, heart failure and other comorbidities.;Nutrition handout(s) given to patient.    Expected Outcomes Short Term Goal: Understand basic principles of dietary content, such as calories, fat, sodium, cholesterol and nutrients.;Short Term Goal: A plan has been developed with personal nutrition goals set during dietitian  appointment.;Long Term Goal: Adherence to prescribed nutrition plan.             Nutrition Discharge:  Nutrition Assessments - 01/15/22 1500       Rate Your Plate Scores   Post Score 57             Education Questionnaire Score:  Knowledge Questionnaire Score - 01/15/22 1622       Knowledge Questionnaire Score   Post Score 17/18             Goals reviewed with patient; copy given to patient.

## 2022-01-18 ENCOUNTER — Telehealth: Payer: Self-pay

## 2022-01-22 ENCOUNTER — Encounter (HOSPITAL_COMMUNITY): Payer: Medicare HMO

## 2022-01-22 ENCOUNTER — Ambulatory Visit (HOSPITAL_COMMUNITY): Admission: RE | Admit: 2022-01-22 | Payer: Medicare HMO | Source: Home / Self Care | Admitting: Gastroenterology

## 2022-01-22 ENCOUNTER — Encounter (HOSPITAL_COMMUNITY): Admission: RE | Payer: Self-pay | Source: Home / Self Care

## 2022-01-22 SURGERY — COLONOSCOPY WITH PROPOFOL
Anesthesia: Monitor Anesthesia Care

## 2022-01-24 ENCOUNTER — Encounter (HOSPITAL_COMMUNITY): Payer: Medicare HMO

## 2022-01-28 ENCOUNTER — Ambulatory Visit
Admission: RE | Admit: 2022-01-28 | Discharge: 2022-01-28 | Disposition: A | Discharge status: Home / Self Care | Payer: Medicare HMO | Source: Ambulatory Visit | Attending: Internal Medicine | Admitting: Internal Medicine

## 2022-01-28 DIAGNOSIS — Z1231 Encounter for screening mammogram for malignant neoplasm of breast: Secondary | ICD-10-CM | POA: Diagnosis not present

## 2022-01-31 ENCOUNTER — Encounter (HOSPITAL_COMMUNITY): Payer: Medicare HMO

## 2022-02-05 ENCOUNTER — Encounter (HOSPITAL_COMMUNITY): Payer: Medicare HMO

## 2022-03-15 DIAGNOSIS — M545 Low back pain, unspecified: Secondary | ICD-10-CM | POA: Diagnosis not present

## 2022-03-15 DIAGNOSIS — J9611 Chronic respiratory failure with hypoxia: Secondary | ICD-10-CM | POA: Diagnosis not present

## 2022-03-15 DIAGNOSIS — I5031 Acute diastolic (congestive) heart failure: Secondary | ICD-10-CM | POA: Diagnosis not present

## 2022-03-15 DIAGNOSIS — E1142 Type 2 diabetes mellitus with diabetic polyneuropathy: Secondary | ICD-10-CM | POA: Diagnosis not present

## 2022-03-15 DIAGNOSIS — I10 Essential (primary) hypertension: Secondary | ICD-10-CM | POA: Diagnosis not present

## 2022-03-19 DIAGNOSIS — E7800 Pure hypercholesterolemia, unspecified: Secondary | ICD-10-CM | POA: Diagnosis not present

## 2022-03-19 DIAGNOSIS — E1165 Type 2 diabetes mellitus with hyperglycemia: Secondary | ICD-10-CM | POA: Diagnosis not present

## 2022-03-21 DIAGNOSIS — E7800 Pure hypercholesterolemia, unspecified: Secondary | ICD-10-CM | POA: Diagnosis not present

## 2022-03-21 DIAGNOSIS — D869 Sarcoidosis, unspecified: Secondary | ICD-10-CM | POA: Diagnosis not present

## 2022-03-21 DIAGNOSIS — I10 Essential (primary) hypertension: Secondary | ICD-10-CM | POA: Diagnosis not present

## 2022-03-21 DIAGNOSIS — E1165 Type 2 diabetes mellitus with hyperglycemia: Secondary | ICD-10-CM | POA: Diagnosis not present

## 2022-03-27 ENCOUNTER — Telehealth: Payer: Self-pay | Admitting: Cardiology

## 2022-03-27 NOTE — Telephone Encounter (Signed)
Returned call to patient left message on personal voice mail to call back. 

## 2022-03-27 NOTE — Telephone Encounter (Signed)
Pt says she was asked when her last labs were drawn and she states that it was last done at [facility] 08/24, but would like to know if she still needs to come have labs drawn. Please advise.

## 2022-04-03 NOTE — Telephone Encounter (Signed)
Spoke with pt and had labs done at PCP not sure what labs were drawn . Per pt will find out and have copies sent to our office if lipid and liver not done will have done prior to over due appt with Dr Stanford Breed Appt made for 06/28/22 at 10:20 am .Adonis Housekeeper

## 2022-04-18 ENCOUNTER — Other Ambulatory Visit: Payer: Self-pay | Admitting: Internal Medicine

## 2022-04-18 DIAGNOSIS — E559 Vitamin D deficiency, unspecified: Secondary | ICD-10-CM | POA: Diagnosis not present

## 2022-04-18 DIAGNOSIS — Z0000 Encounter for general adult medical examination without abnormal findings: Secondary | ICD-10-CM | POA: Diagnosis not present

## 2022-04-18 DIAGNOSIS — Z1239 Encounter for other screening for malignant neoplasm of breast: Secondary | ICD-10-CM | POA: Diagnosis not present

## 2022-04-18 DIAGNOSIS — E1142 Type 2 diabetes mellitus with diabetic polyneuropathy: Secondary | ICD-10-CM | POA: Diagnosis not present

## 2022-04-18 DIAGNOSIS — I10 Essential (primary) hypertension: Secondary | ICD-10-CM | POA: Diagnosis not present

## 2022-04-18 DIAGNOSIS — I5031 Acute diastolic (congestive) heart failure: Secondary | ICD-10-CM | POA: Diagnosis not present

## 2022-04-18 DIAGNOSIS — J9611 Chronic respiratory failure with hypoxia: Secondary | ICD-10-CM | POA: Diagnosis not present

## 2022-04-19 ENCOUNTER — Other Ambulatory Visit: Payer: Self-pay | Admitting: Internal Medicine

## 2022-04-19 DIAGNOSIS — Z1239 Encounter for other screening for malignant neoplasm of breast: Secondary | ICD-10-CM

## 2022-04-19 LAB — COMPLETE METABOLIC PANEL WITH GFR
AG Ratio: 1.4 (calc) (ref 1.0–2.5)
ALT: 19 U/L (ref 6–29)
AST: 18 U/L (ref 10–35)
Albumin: 4.3 g/dL (ref 3.6–5.1)
Alkaline phosphatase (APISO): 104 U/L (ref 37–153)
BUN/Creatinine Ratio: 20 (calc) (ref 6–22)
BUN: 30 mg/dL — ABNORMAL HIGH (ref 7–25)
CO2: 23 mmol/L (ref 20–32)
Calcium: 10.1 mg/dL (ref 8.6–10.4)
Chloride: 102 mmol/L (ref 98–110)
Creat: 1.53 mg/dL — ABNORMAL HIGH (ref 0.50–1.03)
Globulin: 3.1 g/dL (calc) (ref 1.9–3.7)
Glucose, Bld: 118 mg/dL — ABNORMAL HIGH (ref 65–99)
Potassium: 4.2 mmol/L (ref 3.5–5.3)
Sodium: 141 mmol/L (ref 135–146)
Total Bilirubin: 0.3 mg/dL (ref 0.2–1.2)
Total Protein: 7.4 g/dL (ref 6.1–8.1)
eGFR: 40 mL/min/{1.73_m2} — ABNORMAL LOW (ref >=60)

## 2022-04-19 LAB — CBC
HCT: 34 % — ABNORMAL LOW (ref 35.0–45.0)
Hemoglobin: 10.2 g/dL — ABNORMAL LOW (ref 11.7–15.5)
MCH: 20.5 pg — ABNORMAL LOW (ref 27.0–33.0)
MCHC: 30 g/dL — ABNORMAL LOW (ref 32.0–36.0)
MCV: 68.4 fL — ABNORMAL LOW (ref 80.0–100.0)
MPV: 10.8 fL (ref 7.5–12.5)
Platelets: 301 10*3/uL (ref 140–400)
RBC: 4.97 10*6/uL (ref 3.80–5.10)
RDW: 18.7 % — ABNORMAL HIGH (ref 11.0–15.0)
WBC: 7.1 10*3/uL (ref 3.8–10.8)

## 2022-04-19 LAB — LIPID PANEL
Cholesterol: 118 mg/dL (ref <200)
HDL: 31 mg/dL — ABNORMAL LOW (ref >=50)
LDL Cholesterol (Calc): 58 mg/dL (calc)
Non-HDL Cholesterol (Calc): 87 mg/dL (calc) (ref <130)
Total CHOL/HDL Ratio: 3.8 (calc) (ref <5.0)
Triglycerides: 231 mg/dL — ABNORMAL HIGH (ref <150)

## 2022-04-19 LAB — TSH: TSH: 2.64 mIU/L (ref 0.40–4.50)

## 2022-04-19 LAB — VITAMIN D 25 HYDROXY (VIT D DEFICIENCY, FRACTURES): Vit D, 25-Hydroxy: 25 ng/mL — ABNORMAL LOW (ref 30–100)

## 2022-06-10 ENCOUNTER — Other Ambulatory Visit: Payer: Self-pay | Admitting: Internal Medicine

## 2022-06-13 ENCOUNTER — Telehealth: Payer: Self-pay | Admitting: Nurse Practitioner

## 2022-06-13 NOTE — Telephone Encounter (Signed)
Fax received from Dr. Almyra Deforest with [facility] to perform a Endoscopy/ Colonoscopy on patient.  Patient needs surgery clearance. Surgery is 07/30/2022. Patient was seen on 12/05/2021. Office protocol is a risk assessment can be sent to surgeon if patient has been seen in 60 days or less.   Sending to Roxan Diesel NP for risk assessment or recommendations if patient needs to be seen in office prior to surgical procedure.    Called and spoke to patient and got her scheduled for Roxan Diesel NP on 06/27/2022 for surgical clearance

## 2022-06-14 NOTE — Progress Notes (Signed)
Virtual Visit via Video Note   Because of Cassandra Burns's co-morbid illnesses, she is at least at moderate risk for complications without adequate follow up.  This format is felt to be most appropriate for this patient at this time.  All issues noted in this document were discussed and addressed.  A limited physical exam was performed with this format.  Please refer to the patient's chart for her consent to telehealth for [facility].      Date:  06/28/2022   ID:  Cassandra Burns, DOB 02-05-1966, MRN [id number]  Patient Location:Home Provider Location: Home  PCP:  [name], MD  Cardiologist:  Dr Stanford Breed  Evaluation Performed:  Follow-Up Visit  Chief Complaint:  FU hypertension  History of Present Illness:    FU hypertension and dyspnea. High resolution chest CT June 2021 showed findings consistent with sarcoid, coronary artery calcification and aortic atherosclerosis.  CPX August 2021 showed severe functional limitation primarily pulmonary with severe hypertensive response.  Patient body habitus felt to be contributing significantly to dyspnea.  Patient admitted October 2022 with CHF, hypertension and respiratory failure.  She was diuresed, treated with antibiotics and blood pressure medications adjusted.  September 2022 showed cardiomegaly with mild vascular congestion.  Echocardiogram September 2022 showed normal LV function, severe left ventricular hypertrophy, grade 1 diastolic dysfunction.  Since last seen, her dyspnea on exertion has improved with the addition of Trelegy.  She denies orthopnea, PND, pedal edema, chest pain or syncope.  She states her blood pressure is well-controlled.  The patient does not have symptoms concerning for COVID-19 infection (fever, chills, cough, or new shortness of breath).    Past Medical History:  Diagnosis Date   Anxiety    Asthma    Bone pain 06/07/2013   Depression    Diabetes mellitus 12/18/2012   NIDDM    Hernia of abdominal cavity 10/25/2013   History of anemia 07/26/2013   Hypertension    Iron deficiency anemia due to chronic blood loss    Lymphadenopathy 06/07/2013   Obesity 12/18/2012   Psoriasis    Sarcoidosis 07/26/2013   Sleep apnea    uses bipap   Past Surgical History:  Procedure Laterality Date   TUBAL LIGATION       Current Meds  Medication Sig   acetaminophen (TYLENOL) 500 MG tablet Take 1 tablet (500 mg total) by mouth every 6 (six) hours as needed.   albuterol (PROVENTIL) (2.5 MG/3ML) 0.083% nebulizer solution Take 3 mLs (2.5 mg total) by nebulization every 6 (six) hours as needed for wheezing or shortness of breath.   albuterol (VENTOLIN HFA) 108 (90 Base) MCG/ACT inhaler Inhale 1-2 puffs into the lungs every 6 (six) hours as needed for wheezing or shortness of breath.   allopurinol (ZYLOPRIM) 100 MG tablet Take 100 mg by mouth 2 (two) times daily.   amLODipine (NORVASC) 10 MG tablet Take 1 tablet (10 mg total) by mouth daily after lunch.   FLUoxetine (PROZAC) 40 MG capsule Take 40 mg by mouth daily.   fluticasone (FLONASE) 50 MCG/ACT nasal spray Place 1 spray into both nostrils daily as needed for allergies or rhinitis.   Fluticasone-Umeclidin-Vilant (TRELEGY ELLIPTA) 200-62.5-25 MCG/ACT AEPB Inhale 1 puff into the lungs daily.   furosemide (LASIX) 20 MG tablet Take 1 tablet (20 mg total) by mouth daily. (Patient taking differently: Take 20 mg by mouth daily as needed for edema.)   gabapentin (NEURONTIN) 800 MG tablet Take 800 mg by mouth 3 (three) times  daily.   labetalol (NORMODYNE) 100 MG tablet Take 100 mg by mouth daily.   losartan-hydrochlorothiazide (HYZAAR) 100-25 MG tablet Take 1 tablet by mouth daily after lunch.   MOUNJARO 2.5 MG/0.5ML Pen Inject into the skin.   OXYGEN Inhale 2-3 L into the lungs continuous.   predniSONE (DELTASONE) 10 MG tablet 4 tabs for 2 days, then 3 tabs for 2 days, 2 tabs for 2 days, then 1 tab for 2 days, then stop   rosuvastatin  (CRESTOR) 40 MG tablet Take 1 tablet (40 mg total) by mouth daily.   SYNJARDY XR 25-1000 MG TB24 Take 1 tablet by mouth daily.   TOUJEO SOLOSTAR 300 UNIT/ML Solostar Pen Inject 40 Units into the skin 2 (two) times daily after a meal. (Patient taking differently: Inject 60 Units into the skin 2 (two) times daily after a meal.)   Vitamin D, Ergocalciferol, (DRISDOL) 1.25 MG (50000 UNIT) CAPS capsule Take 50,000 Units by mouth every Monday.     Allergies:   Oxycontin [oxycodone]   Social History   Tobacco Use   Smoking status: Former    Types: Cigars    Quit date: 06/30/2017    Years since quitting: 4.9   Smokeless tobacco: Never   Tobacco comments:    Smokes Marijuana ("here and there") (as needed)  Vaping Use   Vaping Use: Never used  Substance Use Topics   Alcohol use: Yes    Comment: occasionally   Drug use: Not Currently    Types: Marijuana     Family Hx: The patient's family history includes Alcohol abuse in her father and mother; Asthma in her father; Diabetes type II in her father; Drug abuse in her father and mother; Heart murmur in her father; Hypertension in her father and mother.  ROS:   Please see the history of present illness.    No Fever, chills  or productive cough All other systems reviewed and are negative.  Recent Labs: 09/27/2021: B Natriuretic Peptide 80.3 04/18/2022: ALT 19; BUN 30; Creat 1.53; Hemoglobin 10.2; Platelets 301; Potassium 4.2; Sodium 141; TSH 2.64   Recent Lipid Panel Lab Results  Component Value Date/Time   CHOL 118 04/18/2022 02:40 PM   TRIG 231 (H) 04/18/2022 02:40 PM   HDL 31 (L) 04/18/2022 02:40 PM   CHOLHDL 3.8 04/18/2022 02:40 PM   LDLCALC 58 04/18/2022 02:40 PM    Wt Readings from Last 3 Encounters:  06/28/22 231 lb (104.8 kg)  06/27/22 231 lb 6.4 oz (105 kg)  01/03/22 233 lb 11 oz (106 kg)     Objective:    Vital Signs:  BP 133/73   Pulse 90   Ht '4\' 11"'$  (1.499 m)   Wt 231 lb (104.8 kg)   LMP 06/13/2012   SpO2 90%    BMI 46.66 kg/m    VITAL SIGNS:  reviewed NAD Answers questions appropriately Normal affect Remainder of physical examination not performed (telehealth visit; coronavirus pandemic)  ASSESSMENT & PLAN:    1 chronic diastolic congestive heart failure-patient appears to be euvolemic on examination.  Continue diuretic at present dose.    2 coronary calcification-noted on prior CT.  Continue statin.  Patient is not having chest pain.  3 hypertension-blood pressure controlled.  Continue present medical regimen.  4 chronic respiratory failure-this is felt to be multifactorial including obstructive sleep apnea, obesity hypoventilation syndrome, pulmonary venous hypertension and asthma.  5 sarcoid-followed by pulmonary.  6 obstructive sleep apnea-continue BiPAP.  7 morbid obesity-we discussed the importance of  diet, exercise and weight loss.  8 hyperlipidemia-continue statin.  COVID-19 Education: The importance of social distancing was discussed today.  Time:   Today, I have spent 16 minutes with the patient with telehealth technology discussing the above problems.     Medication Adjustments/Labs and Tests Ordered: Current medicines are reviewed at length with the patient today.  Concerns regarding medicines are outlined above.   Tests Ordered: No orders of the defined types were placed in this encounter.   Medication Changes: No orders of the defined types were placed in this encounter.   Follow Up:  In Person in 1 year(s)  Signed, Kirk Ruths, MD  06/28/2022 10:17 AM    [place]

## 2022-06-27 ENCOUNTER — Encounter: Payer: Self-pay | Admitting: Nurse Practitioner

## 2022-06-27 ENCOUNTER — Ambulatory Visit (INDEPENDENT_AMBULATORY_CARE_PROVIDER_SITE_OTHER): Payer: Medicare PPO | Admitting: Nurse Practitioner

## 2022-06-27 ENCOUNTER — Ambulatory Visit (INDEPENDENT_AMBULATORY_CARE_PROVIDER_SITE_OTHER): Payer: Medicare PPO

## 2022-06-27 VITALS — BP 128/80 | HR 98 | Ht 59.0 in | Wt 231.4 lb

## 2022-06-27 DIAGNOSIS — Z01811 Encounter for preprocedural respiratory examination: Secondary | ICD-10-CM | POA: Insufficient documentation

## 2022-06-27 DIAGNOSIS — D860 Sarcoidosis of lung: Secondary | ICD-10-CM

## 2022-06-27 DIAGNOSIS — G4733 Obstructive sleep apnea (adult) (pediatric): Secondary | ICD-10-CM

## 2022-06-27 DIAGNOSIS — Z01818 Encounter for other preprocedural examination: Secondary | ICD-10-CM

## 2022-06-27 DIAGNOSIS — J9611 Chronic respiratory failure with hypoxia: Secondary | ICD-10-CM

## 2022-06-27 DIAGNOSIS — J45909 Unspecified asthma, uncomplicated: Secondary | ICD-10-CM | POA: Diagnosis not present

## 2022-06-27 NOTE — Assessment & Plan Note (Signed)
Stable without increased O2 requirement. Goal >88-90% 

## 2022-06-27 NOTE — Progress Notes (Signed)
$'@Patient'n$  ID: Cassandra Burns, female    DOB: 09-05-1965, 56 y.o.   MRN: [id number]  Chief Complaint  Patient presents with   Follow-up    Pt f/u for surgical clearance for routine colonoscopy and endoscopy, she currently uses 4L POC w/ activity and 2L continuous w/ rest.     Referring provider: [name], MD  HPI: 56 year old female, former smoker followed for pulmonary sarcoidosis, OSA/OHS on BiPAP, asthma, chronic respiratory failure.  She is a patient of Dr. Juanetta Gosling and last seen 12/03/2021 by [facility] NP.  Past medical history significant for hypertension, DM, depression, anxiety, morbid obesity, HLD.  She is followed by Dr. Dossie Der with rheumatology for systemic sarcoidosis and was previously treated with chronic prednisone.  TEST/EVENTS:  May 2015 EBUS: Granulomas 08/2013 PFT: DLCO 60% 01/19/2020 HRCT chest: Calcified mediastinal and hilar nodes, patchy retraction BTX with interstitial coarsening and upper/mid lung predominance progress since 2018, air trapping 11/08/2019 PFT: FEV1 46, FVC 42, ratio 91, TLC 56, DLCOunc 55.  Severe restrictive lung disease with moderately severe diffusion defect.  No significant BD; did have mid flow reversibility 04/24/2021 echocardiogram: EF 65 to 70%, G1 DD, severe LVH  08/29/2021: OV with Dr. Halford Chessman.  Recently started on prednisone by rheumatology for joint pain/swelling.  Reported that she had stopped smoking marijuana and breathing improved after that.  No acute symptoms are reported.  Does not want to enroll in pulmonary rehab again.  Sleeping okay and using BiPAP 16/12 cmH2O nightly.  Uses 2 L supplemental O2 24/7  12/03/2021: OV with Tonya Wantz NP for increased shortness of breath over the last few days.  Feels like her activity tolerance is not as great as it normally is.  She has had a minimal cough, which is dry and has not changed much from her baseline.  She did have some swelling in both of her lower extremities which has resolved after taking her as  needed Lasix.  Denies any wheezing, hemoptysis, recent weight loss, PND, orthopnea.  She has not had any increased O2 demand.  Remains on 2 L/min at rest and 3-4 L with activity, which she states is her normal for her.  She continues on Trelegy daily.  Has not been using albuterol very much.  She continues on BiPAP nightly. She does need a 6 min walk per [employer] to replace her POC. Possible sarcoid flare vs asthma flare - treated with prednisone taper.   06/27/2022: Today - surgical clearance Patient presents today for surgical clearance prior to colonoscopy and endoscopy, scheduled for 07/30/2022. She's nervous about her procedure. She's had to be admitted in the past after colonoscopy due to difficulties waking her up afterwards. She doesn't think they put her on BiPAP during her procedures before but she's not entirely sure.  She has been doing well since we saw her last. Feels like after her last flare in May, her breathing has been pretty stable. She has not required any antibiotics or steroids. She still gets short winded with longer distances, but otherwise, can do things around the house without much trouble. She denies any cough, chest congestion, skin lesions, vision changes. She sleeps with her BiPAP every night. Puts it on the second she gets in the bed and falls asleep within 20 minutes. No increased oxygen requirement; usually on 2 lpm at rest and mild activity. Will increase to 3-4 with strenuous exertion. She is participating in pulmonary rehab.   Allergies  Allergen Reactions   Oxycontin [Oxycodone]     "  Too sleepy; had to give me something to knock it out last time I was in hospital"    Immunization History  Administered Date(s) Administered   Influenza Split 08/01/2008, 04/03/2011, 07/07/2012, 08/29/2012, 07/20/2014   Influenza,inj,Quad PF,6+ Mos 08/05/2018   Influenza-Unspecified 05/29/2017   PFIZER(Purple Top)SARS-COV-2 Vaccination 04/29/2020, 05/30/2020   Pneumococcal  Conjugate-13 12/06/2010   Tdap 12/06/2010    Past Medical History:  Diagnosis Date   Anxiety    Asthma    Bone pain 06/07/2013   Depression    Diabetes mellitus 12/18/2012   NIDDM   Hernia of abdominal cavity 10/25/2013   History of anemia 07/26/2013   Hypertension    Iron deficiency anemia due to chronic blood loss    Lymphadenopathy 06/07/2013   Obesity 12/18/2012   Psoriasis    Sarcoidosis 07/26/2013   Sleep apnea    uses bipap    Tobacco History: Social History   Tobacco Use  Smoking Status Former   Types: Cigars   Quit date: 06/30/2017   Years since quitting: 4.9  Smokeless Tobacco Never  Tobacco Comments   Smokes Marijuana ("here and there") (as needed)   Counseling given: Not Answered Tobacco comments: Smokes Marijuana ("here and there") (as needed)   Outpatient Medications Prior to Visit  Medication Sig Dispense Refill   acetaminophen (TYLENOL) 500 MG tablet Take 1 tablet (500 mg total) by mouth every 6 (six) hours as needed. 30 tablet 0   albuterol (PROVENTIL) (2.5 MG/3ML) 0.083% nebulizer solution Take 3 mLs (2.5 mg total) by nebulization every 6 (six) hours as needed for wheezing or shortness of breath. 75 mL 12   albuterol (VENTOLIN HFA) 108 (90 Base) MCG/ACT inhaler Inhale 1-2 puffs into the lungs every 6 (six) hours as needed for wheezing or shortness of breath. 18 g 6   allopurinol (ZYLOPRIM) 100 MG tablet Take 100 mg by mouth 2 (two) times daily.     amLODipine (NORVASC) 10 MG tablet Take 1 tablet (10 mg total) by mouth daily after lunch. 30 tablet 3   FLUoxetine (PROZAC) 40 MG capsule Take 40 mg by mouth daily.     fluticasone (FLONASE) 50 MCG/ACT nasal spray Place 1 spray into both nostrils daily as needed for allergies or rhinitis.     Fluticasone-Umeclidin-Vilant (TRELEGY ELLIPTA) 200-62.5-25 MCG/ACT AEPB Inhale 1 puff into the lungs daily.     furosemide (LASIX) 20 MG tablet Take 1 tablet (20 mg total) by mouth daily. (Patient taking differently:  Take 20 mg by mouth daily as needed for edema.) 90 tablet 0   gabapentin (NEURONTIN) 800 MG tablet Take 800 mg by mouth 3 (three) times daily.     labetalol (NORMODYNE) 100 MG tablet Take 100 mg by mouth daily.     losartan-hydrochlorothiazide (HYZAAR) 100-25 MG tablet Take 1 tablet by mouth daily after lunch.     MOUNJARO 2.5 MG/0.5ML Pen Inject into the skin.     OXYGEN Inhale 2-3 L into the lungs continuous.     predniSONE (DELTASONE) 10 MG tablet 4 tabs for 2 days, then 3 tabs for 2 days, 2 tabs for 2 days, then 1 tab for 2 days, then stop 20 tablet 0   rosuvastatin (CRESTOR) 40 MG tablet Take 1 tablet (40 mg total) by mouth daily. 90 tablet 3   SYNJARDY XR 25-1000 MG TB24 Take 1 tablet by mouth daily.     TOUJEO SOLOSTAR 300 UNIT/ML Solostar Pen Inject 40 Units into the skin 2 (two) times daily after a meal. (Patient  taking differently: Inject 60 Units into the skin 2 (two) times daily after a meal.)     Vitamin D, Ergocalciferol, (DRISDOL) 1.25 MG (50000 UNIT) CAPS capsule Take 50,000 Units by mouth every Monday.     No facility-administered medications prior to visit.     Review of Systems:   Constitutional: No weight loss or gain, night sweats, fevers, chills, fatigue, or lassitude. HEENT: No headaches, difficulty swallowing, tooth/dental problems, or sore throat. No sneezing, itching, ear ache, nasal congestion, or post nasal drip CV:  No chest pain, orthopnea, PND, swelling in lower extremities, anasarca, dizziness, palpitations, syncope Resp: +shortness of breath with exertion (baseline). No excess mucus or change in color of mucus. No productive or non-productive. No hemoptysis. No wheezing.  No chest wall deformity GI:  No heartburn, indigestion, abdominal pain, nausea, vomiting, diarrhea, change in bowel habits, loss of appetite, bloody stools.  Skin: No rash, lesions, ulcerations MSK:  No joint pain or swelling.  No decreased range of motion.  No back pain. Neuro: No dizziness  or lightheadedness.  Psych: No depression or anxiety. Mood stable.     Physical Exam:  BP 128/80   Pulse 98   Ht '4\' 11"'$  (1.499 m)   Wt 231 lb 6.4 oz (105 kg)   LMP 06/13/2012   SpO2 94% Comment: 2L POC  BMI 46.74 kg/m   GEN: Pleasant, interactive, chronically-ill appearing; obese; in no acute distress. HEENT:  Normocephalic and atraumatic. PERRLA. Sclera white. Nasal turbinates pink, moist and patent bilaterally. No rhinorrhea present. Oropharynx pink and moist, without exudate or edema. No lesions, ulcerations, or postnasal drip.  NECK:  Supple w/ fair ROM. No JVD present. Normal carotid impulses w/o bruits. Thyroid symmetrical with no goiter or nodules palpated. No lymphadenopathy.   CV: RRR, no m/r/g, no peripheral edema. Pulses intact, +2 bilaterally. No cyanosis, pallor or clubbing. PULMONARY:  Unlabored, regular breathing. Diminished bilaterally w/o wheezes/rales/rhonchi. No accessory muscle use. No dullness to percussion. GI: BS present and normoactive. Soft, non-tender to palpation. No organomegaly or masses detected.  MSK: No erythema, warmth or tenderness. Cap refil <2 sec all extrem. No deformities or joint swelling noted.  Neuro: A/Ox3. No focal deficits noted.   Skin: Warm, no lesions or rashe Psych: Normal affect and behavior. Judgement and thought content appropriate.     Lab Results:  CBC    Component Value Date/Time   WBC 7.1 04/18/2022 1440   RBC 4.97 04/18/2022 1440   HGB 10.2 (L) 04/18/2022 1440   HGB 11.7 08/08/2017 1001   HGB 9.7 (L) 02/05/2017 1104   HCT 34.0 (L) 04/18/2022 1440   HCT 31.7 (L) 02/05/2017 1104   PLT 301 04/18/2022 1440   PLT 189 08/08/2017 1001   PLT 236 02/05/2017 1104   MCV 68.4 (L) 04/18/2022 1440   MCV 69.2 (L) 02/05/2017 1104   MCH 20.5 (L) 04/18/2022 1440   MCHC 30.0 (L) 04/18/2022 1440   RDW 18.7 (H) 04/18/2022 1440   RDW 20.0 (H) 02/05/2017 1104   LYMPHSABS 1.3 09/27/2021 0050   LYMPHSABS 0.9 02/05/2017 1104   MONOABS  1.2 (H) 09/27/2021 0050   MONOABS 0.5 02/05/2017 1104   EOSABS 0.0 09/27/2021 0050   EOSABS 0.1 02/05/2017 1104   BASOSABS 0.0 09/27/2021 0050   BASOSABS 0.0 02/05/2017 1104    BMET    Component Value Date/Time   NA 141 04/18/2022 1440   NA 137 06/07/2013 0917   K 4.2 04/18/2022 1440   K 4.1 06/07/2013 0917  CL 102 04/18/2022 1440   CL 102 09/09/2012 1010   CO2 23 04/18/2022 1440   CO2 23 06/07/2013 0917   GLUCOSE 118 (H) 04/18/2022 1440   GLUCOSE 183 (H) 06/07/2013 0917   GLUCOSE 237 (H) 09/09/2012 1010   BUN 30 (H) 04/18/2022 1440   BUN 12.0 06/07/2013 0917   CREATININE 1.53 (H) 04/18/2022 1440   CREATININE 0.8 06/07/2013 0917   CALCIUM 10.1 04/18/2022 1440   CALCIUM 10.4 06/07/2013 0917   GFRNONAA 40 (L) 09/29/2021 1656   GFRNONAA 67 09/07/2020 0000   GFRAA 77 09/07/2020 0000    BNP    Component Value Date/Time   BNP 80.3 09/27/2021 0050     Imaging:  No results found.       Latest Ref Rng & Units 11/08/2019   10:11 AM 05/19/2017   10:35 AM 09/20/2014    1:54 PM  PFT Results  FVC-Pre L 1.00  1.33  1.52   FVC-Predicted Pre % 42  55  61   FVC-Post L 0.87  1.26  1.52   FVC-Predicted Post % 36  52  61   Pre FEV1/FVC % % 89  90  83   Post FEV1/FCV % % 91  93  85   FEV1-Pre L 0.88  1.19  1.26   FEV1-Predicted Pre % 46  61  63   FEV1-Post L 0.80  1.17  1.28   DLCO uncorrected ml/min/mmHg 9.95  11.09  11.40   DLCO UNC% % 55  58  60   DLCO corrected ml/min/mmHg  10.99    DLCO COR %Predicted %  58    DLVA Predicted % 126  113  101   TLC L 2.50  2.52  3.62   TLC % Predicted % 56  56  81   RV % Predicted % 79  72  113     No results found for: "NITRICOXIDE"      Assessment & Plan:   Pulmonary sarcoidosis ([place]) Diagnosed via EBUS 2015. Clinically stable. Not currently on long term steroids or immunosuppression therapy. No systemic symptoms.   Patient Instructions  Continue Trelegy 1 puff daily. Brush tongue and rinse mouth afterwards Continue  Albuterol inhaler 2 puffs or 3 mL neb every 6 hours as needed for shortness of breath or wheezing. Notify if symptoms persist despite rescue inhaler/neb use. Continue flonase 1 spray each nostril daily as needed for nasal congestion/drainage Continue supplemental oxygen 2 lpm at rest and 3-4 lpm POC with activity. Goal oxygen level >88-90%  Continue BiPAP every night. Use caution when driving and pull over if sleepy   Chest x ray today for surgical clearance   Use your inhaler the morning of surgery and take your albuterol with you. Recommend they utilize BiPAP during your colonoscopy and immediately following. Use incentive spirometer 10 times per hour, while awake during recovery period. Up and walking as soon as possible after your procedure.   Follow up in 4 months with Dr. Halford Chessman or Alanson Aly. If symptoms do not improve or worsen, please contact office for sooner follow up or seek emergency care.   Asthma Compensated on current regimen with Trelegy and PRN SABA. No recent exacerbations. Asthma action plan in place.  Chronic respiratory failure with hypoxia (HCC) Stable without increased O2 requirement. Goal >88-90%  OSA (obstructive sleep apnea) OSA/OHS. Excellent compliance with BiPAP. Wears nightly. Continues to received good benefit from use.   Preoperative clearance Moderately high risk. Factors that increase the risk  for postoperative pulmonary complications are pulmonary sarcoidosis, asthma, chronic respiratory failure, OSA/OHS, obesity.   Respiratory complications generally occur in 1% of ASA Class I patients, 5% of ASA Class II and 10% of ASA Class III-IV patients These complications rarely result in mortality and include postoperative pneumonia, atelectasis, pulmonary embolism, ARDS and increased time requiring postoperative mechanical ventilation.   Overall, I recommend proceeding with the procedure if the risk for respiratory complications are outweighed by the potential  benefits. This will need to be discussed between the patient and surgeon.   To reduce risks of respiratory complications, I recommend: --Pre- and post-operative incentive spirometry performed frequently while awake --Use of currently prescribed positive-pressure for OSA/OHS whenever the patient is sleeping --Short duration of surgery as much as possible and avoid paralytic if possible --OOB, encourage mobility post-op   1) RISK FOR PROLONGED MECHANICAL VENTILAION - > 48h  1A) Arozullah - Prolonged mech ventilation risk Arozullah Postperative Pulmonary Risk Score - for mech ventilation dependence >48h [employer], Ann Surg 2000, major non-cardiac surgery) Comment Score  Type of surgery - abd ao aneurysm (27), thoracic (21), neurosurgery / upper abdominal / vascular (21), neck (11) Colonoscopy/endoscopy 0  Emergency Surgery - (11)  0  ALbumin < 3 or poor nutritional state - (9) obesity 5  BUN > 30 -  (8)  0  Partial or completely dependent functional status - (7)  0  COPD -  (6) Asthma/sarcoid 8  Age - 60 to 49 (4), > 70  (6)  0  TOTAL  13  Risk Stratifcation scores  - < 10 (0.5%), 11-19 (1.8%), 20-27 (4.2%), 28-40 (10.1%), >40 (26.6%)  1.8%     I spent 32 minutes of dedicated to the care of this patient on the date of this encounter to include pre-visit review of records, face-to-face time with the patient discussing conditions above, post visit ordering of testing, clinical documentation with the electronic health record, making appropriate referrals as documented, and communicating necessary findings to members of the patients care team.  Clayton Bibles, NP 06/27/2022  Pt aware and understands NP's role.

## 2022-06-27 NOTE — Assessment & Plan Note (Signed)
Diagnosed via EBUS 2015. Clinically stable. Not currently on long term steroids or immunosuppression therapy. No systemic symptoms.   Patient Instructions  Continue Trelegy 1 puff daily. Brush tongue and rinse mouth afterwards Continue Albuterol inhaler 2 puffs or 3 mL neb every 6 hours as needed for shortness of breath or wheezing. Notify if symptoms persist despite rescue inhaler/neb use. Continue flonase 1 spray each nostril daily as needed for nasal congestion/drainage Continue supplemental oxygen 2 lpm at rest and 3-4 lpm POC with activity. Goal oxygen level >88-90%  Continue BiPAP every night. Use caution when driving and pull over if sleepy   Chest x ray today for surgical clearance   Use your inhaler the morning of surgery and take your albuterol with you. Recommend they utilize BiPAP during your colonoscopy and immediately following. Use incentive spirometer 10 times per hour, while awake during recovery period. Up and walking as soon as possible after your procedure.   Follow up in 4 months with Dr. Halford Chessman or Alanson Aly. If symptoms do not improve or worsen, please contact office for sooner follow up or seek emergency care.

## 2022-06-27 NOTE — Assessment & Plan Note (Signed)
OSA/OHS. Excellent compliance with BiPAP. Wears nightly. Continues to received good benefit from use.

## 2022-06-27 NOTE — Assessment & Plan Note (Signed)
Moderately high risk. Factors that increase the risk for postoperative pulmonary complications are pulmonary sarcoidosis, asthma, chronic respiratory failure, OSA/OHS, obesity.   Respiratory complications generally occur in 1% of ASA Class I patients, 5% of ASA Class II and 10% of ASA Class III-IV patients These complications rarely result in mortality and include postoperative pneumonia, atelectasis, pulmonary embolism, ARDS and increased time requiring postoperative mechanical ventilation.   Overall, I recommend proceeding with the procedure if the risk for respiratory complications are outweighed by the potential benefits. This will need to be discussed between the patient and surgeon.   To reduce risks of respiratory complications, I recommend: --Pre- and post-operative incentive spirometry performed frequently while awake --Use of currently prescribed positive-pressure for OSA/OHS whenever the patient is sleeping --Short duration of surgery as much as possible and avoid paralytic if possible --OOB, encourage mobility post-op   1) RISK FOR PROLONGED MECHANICAL VENTILAION - > 48h  1A) Arozullah - Prolonged mech ventilation risk Arozullah Postperative Pulmonary Risk Score - for mech ventilation dependence >48h [employer], Ann Surg 2000, major non-cardiac surgery) Comment Score  Type of surgery - abd ao aneurysm (27), thoracic (21), neurosurgery / upper abdominal / vascular (21), neck (11) Colonoscopy/endoscopy 0  Emergency Surgery - (11)  0  ALbumin < 3 or poor nutritional state - (9) obesity 5  BUN > 30 -  (8)  0  Partial or completely dependent functional status - (7)  0  COPD -  (6) Asthma/sarcoid 8  Age - 60 to 69 (4), > 70  (6)  0  TOTAL  13  Risk Stratifcation scores  - < 10 (0.5%), 11-19 (1.8%), 20-27 (4.2%), 28-40 (10.1%), >40 (26.6%)  1.8%

## 2022-06-27 NOTE — Telephone Encounter (Signed)
OV notes and clearance form have been faxed back to [facility]. Nothing further needed at this time.

## 2022-06-27 NOTE — Assessment & Plan Note (Deleted)
Compensated on current regimen with Trelegy and PRN SABA. No recent exacerbations. Asthma action plan in place.

## 2022-06-27 NOTE — Progress Notes (Signed)
Reviewed and agree with assessment/plan.   Chesley Mires, MD [facility] Pulmonary/Critical Care 06/27/2022, 2:12 PM Pager:  [phone]

## 2022-06-27 NOTE — Patient Instructions (Signed)
Continue Trelegy 1 puff daily. Brush tongue and rinse mouth afterwards Continue Albuterol inhaler 2 puffs or 3 mL neb every 6 hours as needed for shortness of breath or wheezing. Notify if symptoms persist despite rescue inhaler/neb use. Continue flonase 1 spray each nostril daily as needed for nasal congestion/drainage Continue supplemental oxygen 2 lpm at rest and 3-4 lpm POC with activity. Goal oxygen level >88-90%  Continue BiPAP every night. Use caution when driving and pull over if sleepy   Chest x ray today for surgical clearance   Use your inhaler the morning of surgery and take your albuterol with you. Recommend they utilize BiPAP during your colonoscopy and immediately following. Use incentive spirometer 10 times per hour, while awake during recovery period. Up and walking as soon as possible after your procedure.   Follow up in 4 months with Dr. Halford Chessman or Alanson Aly. If symptoms do not improve or worsen, please contact office for sooner follow up or seek emergency care.

## 2022-06-27 NOTE — Assessment & Plan Note (Signed)
Compensated on current regimen with Trelegy and PRN SABA. No recent exacerbations. Asthma action plan in place.

## 2022-06-28 ENCOUNTER — Ambulatory Visit: Payer: Medicare HMO | Attending: Cardiology | Admitting: Cardiology

## 2022-06-28 ENCOUNTER — Encounter: Payer: Self-pay | Admitting: Cardiology

## 2022-06-28 VITALS — BP 133/73 | HR 90 | Ht 59.0 in | Wt 231.0 lb

## 2022-06-28 DIAGNOSIS — E785 Hyperlipidemia, unspecified: Secondary | ICD-10-CM | POA: Diagnosis not present

## 2022-06-28 DIAGNOSIS — I5032 Chronic diastolic (congestive) heart failure: Secondary | ICD-10-CM | POA: Diagnosis not present

## 2022-06-28 DIAGNOSIS — I2510 Atherosclerotic heart disease of native coronary artery without angina pectoris: Secondary | ICD-10-CM | POA: Diagnosis not present

## 2022-06-28 DIAGNOSIS — I10 Essential (primary) hypertension: Secondary | ICD-10-CM

## 2022-06-28 NOTE — Patient Instructions (Signed)
  Follow-Up: At [facility], you and your health needs are our priority.  As part of our continuing mission to provide you with exceptional heart care, we have created designated Provider Care Teams.  These Care Teams include your primary Cardiologist (physician) and Advanced Practice Providers (APPs -  Physician Assistants and Nurse Practitioners) who all work together to provide you with the care you need, when you need it.  We recommend signing up for the patient portal called "MyChart".  Sign up information is provided on this After Visit Summary.  MyChart is used to connect with patients for Virtual Visits (Telemedicine).  Patients are able to view lab/test results, encounter notes, upcoming appointments, etc.  Non-urgent messages can be sent to your provider as well.   To learn more about what you can do with MyChart, go to [email or web address].    Your next appointment:   6 month(s)  The format for your next appointment:   In Person  Provider:  Brian Crenshaw MD     

## 2022-06-28 NOTE — Progress Notes (Signed)
Stable CXR with chronic changes. Thanks.

## 2022-07-09 ENCOUNTER — Telehealth: Payer: Self-pay | Admitting: Nurse Practitioner

## 2022-07-09 DIAGNOSIS — J9611 Chronic respiratory failure with hypoxia: Secondary | ICD-10-CM

## 2022-07-09 NOTE — Telephone Encounter (Signed)
Patient is returning phone call. Patient phone number is [phone].

## 2022-07-09 NOTE — Telephone Encounter (Signed)
ATC pt LVM for her to call office back.  

## 2022-07-09 NOTE — Telephone Encounter (Signed)
Patient called to request that the doctor send a prescription for her oxygen to [facility].  Please advise and call patient to confirm at [phone]

## 2022-07-10 NOTE — Telephone Encounter (Signed)
ATC X1 LVM for pt to give [place] a call back

## 2022-07-10 NOTE — Telephone Encounter (Signed)
Patient is calling again and still has not heard back from the nurse or doctor.  Please call patient to discuss asap.  CB# [phone]

## 2022-07-10 NOTE — Telephone Encounter (Signed)
Ret call from yesterday. Pls. Call.  

## 2022-07-10 NOTE — Telephone Encounter (Unsigned)
Calling back again on Oxy script. Please reach out again. Tnx.

## 2022-07-11 NOTE — Telephone Encounter (Signed)
Patient is returning phone call. Patient phone number is [phone].

## 2022-07-11 NOTE — Telephone Encounter (Signed)
Called and went over oxygen order with patient. Patient asked if we could sent the oxygen order to [facility]. Order created and sent to [facility]. Nothing further needed

## 2022-07-14 ENCOUNTER — Emergency Department (HOSPITAL_COMMUNITY): Payer: Medicare PPO

## 2022-07-14 ENCOUNTER — Emergency Department (HOSPITAL_COMMUNITY)
Admission: EM | Admit: 2022-07-14 | Discharge: 2022-07-14 | Disposition: A | Discharge status: Home / Self Care | Payer: Medicare PPO | Attending: Emergency Medicine | Admitting: Emergency Medicine

## 2022-07-14 ENCOUNTER — Other Ambulatory Visit: Payer: Self-pay

## 2022-07-14 ENCOUNTER — Encounter (HOSPITAL_COMMUNITY): Payer: Self-pay

## 2022-07-14 DIAGNOSIS — M25512 Pain in left shoulder: Secondary | ICD-10-CM | POA: Insufficient documentation

## 2022-07-14 DIAGNOSIS — Y92410 Unspecified street and highway as the place of occurrence of the external cause: Secondary | ICD-10-CM | POA: Insufficient documentation

## 2022-07-14 DIAGNOSIS — M546 Pain in thoracic spine: Secondary | ICD-10-CM | POA: Insufficient documentation

## 2022-07-14 DIAGNOSIS — R930 Abnormal findings on diagnostic imaging of skull and head, not elsewhere classified: Secondary | ICD-10-CM | POA: Diagnosis not present

## 2022-07-14 MED ORDER — CYCLOBENZAPRINE HCL 5 MG PO TABS: 5.0000 mg | ORAL_TABLET | Freq: Two times a day (BID) | ORAL | 0 refills | Status: DC | PRN

## 2022-07-14 MED ORDER — ACETAMINOPHEN 500 MG PO TABS
1000.0000 mg | ORAL_TABLET | Freq: Once | ORAL | Status: AC
  Administered 2022-07-14: 1000 mg via ORAL
  Filled 2022-07-14: qty 2
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
## 2022-07-14 NOTE — ED Notes (Signed)
Pt alert, NAD, calm, interactive, resting comfortably, to CT/ xray by stretcher.

## 2022-07-14 NOTE — Discharge Instructions (Signed)
Return for any problem.  ?

## 2022-07-14 NOTE — ED Notes (Signed)
Pt wheeled from ed transferred to vehicle. Family to drive home.
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
## 2022-07-16 ENCOUNTER — Telehealth: Payer: Self-pay | Admitting: Nurse Practitioner

## 2022-07-16 NOTE — Telephone Encounter (Signed)
ATC Can not leave message. Called [employer] and and a rep said they needed the pt's O2 saturations. This is the pt's Care Coordination Notes. But I faxed it to [facility] with a note stating they can access this info in the pt's C.C.N. Nothing further needed at this time.

## 2022-07-16 NOTE — Telephone Encounter (Signed)
PT calling again,. See last signed Encounter. Wants to speak to nurse about [employer] script. TY @ [phone]  They say they need to see "the stats" of her last visit and they gave her this number for you to call [phone]. TY.

## 2022-07-19 NOTE — Progress Notes (Addendum)
Called pt. To do preop for 07-30-21 colonoscopy/ EGD. Pt. Stated she was scared to do the procedure because she has had to be hospitalized in the past after a colonoscopy due to hard to wake up and BP issues. Pt would like to call Dr. Randel Pigg office to talk with them  about this issue before going through with  preop call. Pt. Asked if we could call her back another time next week.

## 2022-07-23 ENCOUNTER — Telehealth: Payer: Self-pay | Admitting: *Deleted

## 2022-07-23 NOTE — Telephone Encounter (Signed)
     Patient  visit on 07/14/2022  at [facility] long ed  was for [facility]  Have you been able to follow up with your primary care physician? Patient feeling better only occasionly headaches will follow up with PCP if she has any issues  The patient was  able to obtain any needed medicine or equipment.  Are there diet recommendations that you are having difficulty following?  Patient expresses understanding of discharge instructions and education provided has no other needs at this time.   [phone] [address] , [place] [address] Email : [email or web address]

## 2022-07-25 ENCOUNTER — Encounter (HOSPITAL_COMMUNITY): Payer: Self-pay | Admitting: Internal Medicine

## 2022-07-25 NOTE — Progress Notes (Signed)
Called patient for pre op for endo procedure 1/2. Another pre surg RN called on 12/22 and per pt was nervous about the anesthesia for procedure so she instructed pt to call the office to discuss and requested we call her back the following week. Upon speaking with her today she had spoke to office and was agreeable to proceed. When reviewing her medicines with her she reported she took her Darcel Bayley today (12/28) and I informed her that is a medicine that needs to be held a week prior. Also she reported she is not feeling well right now either (having some cold symptoms) so she felt with the mounjaro and the cold better to reschedule. I called the GI office and informed them and they said they will call her to reschedule.

## 2022-07-30 ENCOUNTER — Other Ambulatory Visit: Payer: Self-pay | Admitting: Cardiology

## 2022-07-30 DIAGNOSIS — E785 Hyperlipidemia, unspecified: Secondary | ICD-10-CM

## 2022-08-30 ENCOUNTER — Telehealth: Payer: Self-pay | Admitting: Nurse Practitioner

## 2022-08-30 NOTE — Telephone Encounter (Signed)
Called pt and was unable to reach her   [employer] needs updated sats to process DME o2 order  [place] tried to reach out to pt to come in for walk test - failed attempts to sch a new appt   [employer] order is on hold for now   [facility]

## 2022-08-30 NOTE — Telephone Encounter (Signed)
I was able to reach out to pt and get her scheduled for an OV on 2/8 @ 10am

## 2022-09-02 NOTE — Telephone Encounter (Signed)
Thanks, [place]!

## 2022-09-05 ENCOUNTER — Ambulatory Visit (INDEPENDENT_AMBULATORY_CARE_PROVIDER_SITE_OTHER): Payer: Medicare PPO

## 2022-09-05 ENCOUNTER — Encounter: Payer: Self-pay | Admitting: Nurse Practitioner

## 2022-09-05 ENCOUNTER — Ambulatory Visit (INDEPENDENT_AMBULATORY_CARE_PROVIDER_SITE_OTHER): Payer: Medicare PPO | Admitting: Nurse Practitioner

## 2022-09-05 VITALS — BP 118/70 | HR 100 | Temp 98.3°F | Ht 59.0 in | Wt 223.2 lb

## 2022-09-05 DIAGNOSIS — J9611 Chronic respiratory failure with hypoxia: Secondary | ICD-10-CM

## 2022-09-05 DIAGNOSIS — D860 Sarcoidosis of lung: Secondary | ICD-10-CM

## 2022-09-05 DIAGNOSIS — J45909 Unspecified asthma, uncomplicated: Secondary | ICD-10-CM

## 2022-09-05 DIAGNOSIS — Z01811 Encounter for preprocedural respiratory examination: Secondary | ICD-10-CM | POA: Diagnosis not present

## 2022-09-05 DIAGNOSIS — G4733 Obstructive sleep apnea (adult) (pediatric): Secondary | ICD-10-CM

## 2022-09-05 NOTE — Progress Notes (Signed)
Reviewed and agree with assessment/plan.   Chesley Mires, MD [facility] Pulmonary/Critical Care 09/05/2022, 12:55 PM Pager:  [phone]

## 2022-09-05 NOTE — Progress Notes (Signed)
$'@Patient'I$  ID: Cassandra Burns, female    DOB: [birth year], 57 y.o.   MRN: [id number]  Chief Complaint  Patient presents with   Follow-up    Pt states she has been doing okay since last visit and denies any complaints. Pt is needing to have qualifying walk performed due to switching DMEs.    Referring provider: [name], MD  HPI: 57 year old female, former smoker followed for pulmonary sarcoidosis, OSA/OHS on BiPAP, asthma, chronic respiratory failure.  She is a patient of Dr. Juanetta Gosling and last seen 06/27/2022 by Belenda Cruise NP.  Past medical history significant for hypertension, DM, depression, anxiety, morbid obesity, HLD.  She is followed by Dr. Dossie Der with rheumatology for systemic sarcoidosis and was previously treated with chronic prednisone.  TEST/EVENTS:  May 2015 EBUS: Granulomas 08/2013 PFT: DLCO 60% 01/19/2020 HRCT chest: Calcified mediastinal and hilar nodes, patchy retraction BTX with interstitial coarsening and upper/mid lung predominance progress since 2018, air trapping 11/08/2019 PFT: FEV1 46, FVC 42, ratio 91, TLC 56, DLCOunc 55.  Severe restrictive lung disease with moderately severe diffusion defect.  No significant BD; did have mid flow reversibility 04/24/2021 echocardiogram: EF 65 to 70%, G1 DD, severe LVH  08/29/2021: OV with Dr. Halford Chessman.  Recently started on prednisone by rheumatology for joint pain/swelling.  Reported that she had stopped smoking marijuana and breathing improved after that.  No acute symptoms are reported.  Does not want to enroll in pulmonary rehab again.  Sleeping okay and using BiPAP 16/12 cmH2O nightly.  Uses 2 L supplemental O2 24/7  12/03/2021: OV with Nyilah Kight NP for increased shortness of breath over the last few days.  Feels like her activity tolerance is not as great as it normally is.  She has had a minimal cough, which is dry and has not changed much from her baseline.  She did have some swelling in both of her lower extremities which has resolved after  taking her as needed Lasix.  Denies any wheezing, hemoptysis, recent weight loss, PND, orthopnea.  She has not had any increased O2 demand.  Remains on 2 L/min at rest and 3-4 L with activity, which she states is her normal for her.  She continues on Trelegy daily.  Has not been using albuterol very much.  She continues on BiPAP nightly. She does need a 6 min walk per [employer] to replace her POC. Possible sarcoid flare vs asthma flare - treated with prednisone taper.   06/27/2022: OV with Ohm Dentler NP for surgical clearance prior to colonoscopy and endoscopy, scheduled for 07/30/2022. She's nervous about her procedure. She's had to be admitted in the past after colonoscopy due to difficulties waking her up afterwards. She doesn't think they put her on BiPAP during her procedures before but she's not entirely sure.  She has been doing well since we saw her last. Feels like after her last flare in May, her breathing has been pretty stable. She has not required any antibiotics or steroids. She still gets short winded with longer distances, but otherwise, can do things around the house without much trouble. She denies any cough, chest congestion, skin lesions, vision changes. She sleeps with her BiPAP every night. Puts it on the second she gets in the bed and falls asleep within 20 minutes. No increased oxygen requirement; usually on 2 lpm at rest and mild activity. Will increase to 3-4 with strenuous exertion. She is participating in pulmonary rehab.   09/05/2022: Today - follow up Patient presents today for follow up and  to renew her oxygen qualification, per request of her DME. She has been doing well since she was here last. Her breathing has been stable. No flares since last May. No increased cough or chest congestion. She did postpone her EGD and colonoscopy so she will now be having these the end of the month. She wears her oxygen 24/7. Wears her BiPAP every night and receives good benefit from use. No concerns or  complaints today.   Allergies  Allergen Reactions   Oxycontin [Oxycodone]     "Too sleepy; had to give me something to knock it out last time I was in hospital"    Immunization History  Administered Date(s) Administered   Influenza Split 08/01/2008, 04/03/2011, 07/07/2012, 08/29/2012, 07/20/2014   Influenza,inj,Quad PF,6+ Mos 08/05/2018   Influenza-Unspecified 05/29/2017   PFIZER(Purple Top)SARS-COV-2 Vaccination 04/29/2020, 05/30/2020   Pneumococcal Conjugate-13 12/06/2010   Tdap 12/06/2010    Past Medical History:  Diagnosis Date   Anxiety    Asthma    Bone pain 06/07/2013   Depression    Diabetes mellitus 12/18/2012   NIDDM   Hernia of abdominal cavity 10/25/2013   History of anemia 07/26/2013   Hypertension    Iron deficiency anemia due to chronic blood loss    Lymphadenopathy 06/07/2013   Obesity 12/18/2012   Psoriasis    Sarcoidosis 07/26/2013   Sleep apnea    uses bipap    Tobacco History: Social History   Tobacco Use  Smoking Status Former   Types: Cigars   Quit date: 06/30/2017   Years since quitting: 5.1  Smokeless Tobacco Never  Tobacco Comments   Smokes Marijuana ("here and there") (as needed)   Counseling given: Not Answered Tobacco comments: Smokes Marijuana ("here and there") (as needed)   Outpatient Medications Prior to Visit  Medication Sig Dispense Refill   acetaminophen (TYLENOL) 500 MG tablet Take 1 tablet (500 mg total) by mouth every 6 (six) hours as needed. 30 tablet 0   albuterol (PROVENTIL) (2.5 MG/3ML) 0.083% nebulizer solution Take 3 mLs (2.5 mg total) by nebulization every 6 (six) hours as needed for wheezing or shortness of breath. 75 mL 12   albuterol (VENTOLIN HFA) 108 (90 Base) MCG/ACT inhaler Inhale 1-2 puffs into the lungs every 6 (six) hours as needed for wheezing or shortness of breath. 18 g 6   allopurinol (ZYLOPRIM) 100 MG tablet Take 100 mg by mouth 2 (two) times daily.     amLODipine (NORVASC) 10 MG tablet Take 1 tablet  (10 mg total) by mouth daily after lunch. 30 tablet 3   cyclobenzaprine (FLEXERIL) 5 MG tablet Take 1 tablet (5 mg total) by mouth 2 (two) times daily as needed for muscle spasms. 15 tablet 0   FLUoxetine (PROZAC) 40 MG capsule Take 40 mg by mouth daily.     fluticasone (FLONASE) 50 MCG/ACT nasal spray Place 1 spray into both nostrils daily as needed for allergies or rhinitis.     Fluticasone-Umeclidin-Vilant (TRELEGY ELLIPTA) 200-62.5-25 MCG/ACT AEPB Inhale 1 puff into the lungs daily.     furosemide (LASIX) 20 MG tablet Take 1 tablet (20 mg total) by mouth daily. (Patient taking differently: Take 20 mg by mouth daily as needed for edema.) 90 tablet 0   gabapentin (NEURONTIN) 800 MG tablet Take 800 mg by mouth 3 (three) times daily.     labetalol (NORMODYNE) 100 MG tablet Take 100 mg by mouth daily.     losartan-hydrochlorothiazide (HYZAAR) 100-25 MG tablet Take 1 tablet by mouth daily after  lunch.     MOUNJARO 10 MG/0.5ML Pen Inject 10 mg into the skin once a week.     OXYGEN Inhale 2-3 L into the lungs continuous.     rosuvastatin (CRESTOR) 40 MG tablet Take 1 tablet by mouth once daily 90 tablet 2   SYNJARDY XR 25-1000 MG TB24 Take 1 tablet by mouth daily.     TOUJEO SOLOSTAR 300 UNIT/ML Solostar Pen Inject 40 Units into the skin 2 (two) times daily after a meal. (Patient taking differently: Inject 60 Units into the skin 2 (two) times daily after a meal.)     Vitamin D, Ergocalciferol, (DRISDOL) 1.25 MG (50000 UNIT) CAPS capsule Take 50,000 Units by mouth every Monday.     predniSONE (DELTASONE) 10 MG tablet 4 tabs for 2 days, then 3 tabs for 2 days, 2 tabs for 2 days, then 1 tab for 2 days, then stop 20 tablet 0   No facility-administered medications prior to visit.     Review of Systems:   Constitutional: No weight loss or gain, night sweats, fevers, chills, fatigue, or lassitude. HEENT: No headaches, difficulty swallowing, tooth/dental problems, or sore throat. No sneezing, itching,  ear ache, nasal congestion, or post nasal drip CV:  No chest pain, orthopnea, PND, swelling in lower extremities, anasarca, dizziness, palpitations, syncope Resp: +shortness of breath with exertion (baseline). No excess mucus or change in color of mucus. No productive or non-productive. No hemoptysis. No wheezing.  No chest wall deformity GI:  No heartburn, indigestion, abdominal pain, nausea, vomiting, diarrhea, change in bowel habits, loss of appetite, bloody stools.  Skin: No rash, lesions, ulcerations MSK:  No joint pain or swelling.  No decreased range of motion.  No back pain. Neuro: No dizziness or lightheadedness.  Psych: No depression or anxiety. Mood stable.     Physical Exam:  BP 118/70 (BP Location: Left Arm, Patient Position: Sitting, Cuff Size: Large)   Pulse 100   Temp 98.3 F (36.8 C) (Oral)   Ht '4\' 11"'$  (1.499 m)   Wt 223 lb 3.2 oz (101.2 kg)   LMP 06/13/2012   SpO2 92% Comment: 2L pulse  BMI 45.08 kg/m   GEN: Pleasant, interactive, chronically-ill appearing; obese; in no acute distress. HEENT:  Normocephalic and atraumatic. PERRLA. Sclera white. Nasal turbinates pink, moist and patent bilaterally. No rhinorrhea present. Oropharynx pink and moist, without exudate or edema. No lesions, ulcerations, or postnasal drip.  NECK:  Supple w/ fair ROM. No JVD present. Normal carotid impulses w/o bruits. Thyroid symmetrical with no goiter or nodules palpated. No lymphadenopathy.   CV: RRR, no m/r/g, no peripheral edema. Pulses intact, +2 bilaterally. No cyanosis, pallor or clubbing. PULMONARY:  Unlabored, regular breathing. Diminished bilaterally w/o wheezes/rales/rhonchi. No accessory muscle use. No dullness to percussion. GI: BS present and normoactive. Soft, non-tender to palpation. No organomegaly or masses detected.  MSK: No erythema, warmth or tenderness. Cap refil <2 sec all extrem. No deformities or joint swelling noted.  Neuro: A/Ox3. No focal deficits noted.   Skin:  Warm, no lesions or rashe Psych: Normal affect and behavior. Judgement and thought content appropriate.     Lab Results:  CBC    Component Value Date/Time   WBC 7.1 04/18/2022 1440   RBC 4.97 04/18/2022 1440   HGB 10.2 (L) 04/18/2022 1440   HGB 11.7 08/08/2017 1001   HGB 9.7 (L) 02/05/2017 1104   HCT 34.0 (L) 04/18/2022 1440   HCT 31.7 (L) 02/05/2017 1104   PLT 301 04/18/2022 1440  PLT 189 08/08/2017 1001   PLT 236 02/05/2017 1104   MCV 68.4 (L) 04/18/2022 1440   MCV 69.2 (L) 02/05/2017 1104   MCH 20.5 (L) 04/18/2022 1440   MCHC 30.0 (L) 04/18/2022 1440   RDW 18.7 (H) 04/18/2022 1440   RDW 20.0 (H) 02/05/2017 1104   LYMPHSABS 1.3 09/27/2021 0050   LYMPHSABS 0.9 02/05/2017 1104   MONOABS 1.2 (H) 09/27/2021 0050   MONOABS 0.5 02/05/2017 1104   EOSABS 0.0 09/27/2021 0050   EOSABS 0.1 02/05/2017 1104   BASOSABS 0.0 09/27/2021 0050   BASOSABS 0.0 02/05/2017 1104    BMET    Component Value Date/Time   NA 141 04/18/2022 1440   NA 137 06/07/2013 0917   K 4.2 04/18/2022 1440   K 4.1 06/07/2013 0917   CL 102 04/18/2022 1440   CL 102 09/09/2012 1010   CO2 23 04/18/2022 1440   CO2 23 06/07/2013 0917   GLUCOSE 118 (H) 04/18/2022 1440   GLUCOSE 183 (H) 06/07/2013 0917   GLUCOSE 237 (H) 09/09/2012 1010   BUN 30 (H) 04/18/2022 1440   BUN 12.0 06/07/2013 0917   CREATININE 1.53 (H) 04/18/2022 1440   CREATININE 0.8 06/07/2013 0917   CALCIUM 10.1 04/18/2022 1440   CALCIUM 10.4 06/07/2013 0917   GFRNONAA 40 (L) 09/29/2021 1656   GFRNONAA 67 09/07/2020 0000   GFRAA 77 09/07/2020 0000    BNP    Component Value Date/Time   BNP 80.3 09/27/2021 0050     Imaging:  No results found.       Latest Ref Rng & Units 11/08/2019   10:11 AM 05/19/2017   10:35 AM 09/20/2014    1:54 PM  PFT Results  FVC-Pre L 1.00  1.33  1.52   FVC-Predicted Pre % 42  55  61   FVC-Post L 0.87  1.26  1.52   FVC-Predicted Post % 36  52  61   Pre FEV1/FVC % % 89  90  83   Post FEV1/FCV % %  91  93  85   FEV1-Pre L 0.88  1.19  1.26   FEV1-Predicted Pre % 46  61  63   FEV1-Post L 0.80  1.17  1.28   DLCO uncorrected ml/min/mmHg 9.95  11.09  11.40   DLCO UNC% % 55  58  60   DLCO corrected ml/min/mmHg  10.99    DLCO COR %Predicted %  58    DLVA Predicted % 126  113  101   TLC L 2.50  2.52  3.62   TLC % Predicted % 56  56  81   RV % Predicted % 79  72  113     No results found for: "NITRICOXIDE"      Assessment & Plan:   Pulmonary sarcoidosis ([place]) Diagnosed via EBUS 2015. Clinically stable. Not currently on long term steroids or immunosuppression therapy. No systemic symptoms.    Asthma Compensated on current regimen with Trelegy and PRN SABA. No recent exacerbations. Asthma action plan in place.   Chronic respiratory failure with hypoxia ([place]) [employer] requested renewal for her oxygen. She desaturated to 84% on room air today; required 2 lpm on POC to maintain saturations >88-90%. She will continue her oxygen therapy 2-4 lpm to maintain saturations >88-90%. Orders placed today  Preoperative respiratory examination Moderately high risk. Factors that increase the risk for postoperative pulmonary complications are pulmonary sarcoidosis, asthma, chronic respiratory failure, OSA/OHS, obesity.    Respiratory complications generally occur in 1% of  ASA Class I patients, 5% of ASA Class II and 10% of ASA Class III-IV patients These complications rarely result in mortality and include postoperative pneumonia, atelectasis, pulmonary embolism, ARDS and increased time requiring postoperative mechanical ventilation.   Overall, I recommend proceeding with the procedure if the risk for respiratory complications are outweighed by the potential benefits. This will need to be discussed between the patient and surgeon.   To reduce risks of respiratory complications, I recommend: --Pre- and post-operative incentive spirometry performed frequently while awake --Use of currently prescribed  positive-pressure for OSA/OHS whenever the patient is sleeping --Short duration of surgery as much as possible and avoid paralytic if possible --OOB, encourage mobility post-op     1) RISK FOR PROLONGED MECHANICAL VENTILAION - > 48h   1A) Arozullah - Prolonged mech ventilation risk Arozullah Postperative Pulmonary Risk Score - for mech ventilation dependence >48h [employer], Ann Surg 2000, major non-cardiac surgery) Comment Score  Type of surgery - abd ao aneurysm (27), thoracic (21), neurosurgery / upper abdominal / vascular (21), neck (11) Colonoscopy/endoscopy 0  Emergency Surgery - (11)   0  ALbumin < 3 or poor nutritional state - (9) obesity 5  BUN > 30 -  (8)   0  Partial or completely dependent functional status - (7)   0  COPD -  (6) Asthma/sarcoid 8  Age - 60 to 69 (4), > 70  (6)   0  TOTAL   13  Risk Stratifcation scores  - < 10 (0.5%), 11-19 (1.8%), 20-27 (4.2%), 28-40 (10.1%), >40 (26.6%)   1.8%       OSA (obstructive sleep apnea) Excellent compliance and continues to receive benefit from use.      I spent 32 minutes of dedicated to the care of this patient on the date of this encounter to include pre-visit review of records, face-to-face time with the patient discussing conditions above, post visit ordering of testing, clinical documentation with the electronic health record, making appropriate referrals as documented, and communicating necessary findings to members of the patients care team.  Clayton Bibles, NP 09/05/2022  Pt aware and understands NP's role.

## 2022-09-05 NOTE — Assessment & Plan Note (Signed)
Excellent compliance and continues to receive benefit from use.

## 2022-09-05 NOTE — Patient Instructions (Addendum)
Continue Trelegy 1 puff daily. Brush tongue and rinse mouth afterwards Continue Albuterol inhaler 2 puffs or 3 mL neb every 6 hours as needed for shortness of breath or wheezing. Notify if symptoms persist despite rescue inhaler/neb use. Continue flonase 1 spray each nostril daily as needed for nasal congestion/drainage Continue supplemental oxygen 2-4 lpm for goal >88-90%  Continue BiPAP every night. Use caution when driving and pull over if sleepy   Chest x ray today for surgical clearance   Use your inhaler the morning of surgery and take your albuterol with you. Recommend they utilize BiPAP during your colonoscopy and immediately following. Use incentive spirometer 10 times per hour, while awake during recovery period. Up and walking as soon as possible after your procedure.   Follow up in 4 months with Dr. Halford Chessman or Alanson Aly. If symptoms do not improve or worsen, please contact office for sooner follow up or seek emergency care.

## 2022-09-05 NOTE — Assessment & Plan Note (Signed)
Diagnosed via EBUS 2015. Clinically stable. Not currently on long term steroids or immunosuppression therapy. No systemic symptoms.

## 2022-09-05 NOTE — Assessment & Plan Note (Signed)
Compensated on current regimen with Trelegy and PRN SABA. No recent exacerbations. Asthma action plan in place.

## 2022-09-05 NOTE — Assessment & Plan Note (Signed)
Moderately high risk. Factors that increase the risk for postoperative pulmonary complications are pulmonary sarcoidosis, asthma, chronic respiratory failure, OSA/OHS, obesity.    Respiratory complications generally occur in 1% of ASA Class I patients, 5% of ASA Class II and 10% of ASA Class III-IV patients These complications rarely result in mortality and include postoperative pneumonia, atelectasis, pulmonary embolism, ARDS and increased time requiring postoperative mechanical ventilation.   Overall, I recommend proceeding with the procedure if the risk for respiratory complications are outweighed by the potential benefits. This will need to be discussed between the patient and surgeon.   To reduce risks of respiratory complications, I recommend: --Pre- and post-operative incentive spirometry performed frequently while awake --Use of currently prescribed positive-pressure for OSA/OHS whenever the patient is sleeping --Short duration of surgery as much as possible and avoid paralytic if possible --OOB, encourage mobility post-op     1) RISK FOR PROLONGED MECHANICAL VENTILAION - > 48h   1A) Arozullah - Prolonged mech ventilation risk Arozullah Postperative Pulmonary Risk Score - for mech ventilation dependence >48h [employer], Ann Surg 2000, major non-cardiac surgery) Comment Score  Type of surgery - abd ao aneurysm (27), thoracic (21), neurosurgery / upper abdominal / vascular (21), neck (11) Colonoscopy/endoscopy 0  Emergency Surgery - (11)   0  ALbumin < 3 or poor nutritional state - (9) obesity 5  BUN > 30 -  (8)   0  Partial or completely dependent functional status - (7)   0  COPD -  (6) Asthma/sarcoid 8  Age - 60 to 69 (4), > 70  (6)   0  TOTAL   13  Risk Stratifcation scores  - < 10 (0.5%), 11-19 (1.8%), 20-27 (4.2%), 28-40 (10.1%), >40 (26.6%)   1.8%

## 2022-09-05 NOTE — Assessment & Plan Note (Signed)
[employer] requested renewal for her oxygen. She desaturated to 84% on room air today; required 2 lpm on POC to maintain saturations >88-90%. She will continue her oxygen therapy 2-4 lpm to maintain saturations >88-90%. Orders placed today

## 2022-09-09 NOTE — Progress Notes (Signed)
CXR stable. Thanks.

## 2022-09-17 ENCOUNTER — Encounter (HOSPITAL_COMMUNITY): Payer: Self-pay | Admitting: Internal Medicine

## 2022-09-17 NOTE — Progress Notes (Signed)
Cassandra Burns  Prep instructions- has old instructions for different prep, called office they will send new instructions via email  PCP- Cassandra Ebbs MD Cards- Cassandra Ruths MD  EKG-09/29/21 Echo- 04/24/21 Cath- n/a Stress- 03/07/20 ICD/PM- n/a Blood thinner- n/a GLP-1- Mounjaro last dose 09/11/22  Anesthesia Review: Hx of Asthma, CHF, HTN, Chronic resp failure, on 2-4L O2, DM, OSA, Pulm sarcoidosis. Has had a previous issue with anesthesia after one colonoscopy they were having problems getting her awake and with her BP so they admitted her for a couple days, she believes this was at [facility], I am unable to find  records of this. Pulmonology last seen 09/05/22 and gave clearance to have procedure.

## 2022-09-24 ENCOUNTER — Ambulatory Visit (HOSPITAL_COMMUNITY)
Admission: RE | Admit: 2022-09-24 | Discharge: 2022-09-24 | Disposition: A | Discharge status: Home / Self Care | Payer: Medicare PPO | Attending: Internal Medicine | Admitting: Internal Medicine

## 2022-09-24 ENCOUNTER — Ambulatory Visit (HOSPITAL_BASED_OUTPATIENT_CLINIC_OR_DEPARTMENT_OTHER): Payer: Medicare PPO | Admitting: Anesthesiology

## 2022-09-24 ENCOUNTER — Encounter (HOSPITAL_COMMUNITY)
Admission: RE | Disposition: A | Discharge status: Home / Self Care | Payer: Self-pay | Source: Home / Self Care | Attending: Internal Medicine

## 2022-09-24 ENCOUNTER — Encounter (HOSPITAL_COMMUNITY): Payer: Self-pay | Admitting: Internal Medicine

## 2022-09-24 ENCOUNTER — Ambulatory Visit (HOSPITAL_COMMUNITY): Payer: Medicare PPO | Admitting: Anesthesiology

## 2022-09-24 ENCOUNTER — Other Ambulatory Visit: Payer: Self-pay

## 2022-09-24 DIAGNOSIS — I10 Essential (primary) hypertension: Secondary | ICD-10-CM | POA: Insufficient documentation

## 2022-09-24 DIAGNOSIS — D122 Benign neoplasm of ascending colon: Secondary | ICD-10-CM | POA: Diagnosis not present

## 2022-09-24 DIAGNOSIS — E119 Type 2 diabetes mellitus without complications: Secondary | ICD-10-CM | POA: Insufficient documentation

## 2022-09-24 DIAGNOSIS — K2970 Gastritis, unspecified, without bleeding: Secondary | ICD-10-CM

## 2022-09-24 DIAGNOSIS — K31A0 Gastric intestinal metaplasia, unspecified: Secondary | ICD-10-CM | POA: Insufficient documentation

## 2022-09-24 DIAGNOSIS — K3189 Other diseases of stomach and duodenum: Secondary | ICD-10-CM | POA: Diagnosis not present

## 2022-09-24 DIAGNOSIS — G4730 Sleep apnea, unspecified: Secondary | ICD-10-CM | POA: Insufficient documentation

## 2022-09-24 DIAGNOSIS — K2289 Other specified disease of esophagus: Secondary | ICD-10-CM

## 2022-09-24 DIAGNOSIS — Z87891 Personal history of nicotine dependence: Secondary | ICD-10-CM

## 2022-09-24 DIAGNOSIS — K625 Hemorrhage of anus and rectum: Secondary | ICD-10-CM | POA: Diagnosis present

## 2022-09-24 DIAGNOSIS — Z9981 Dependence on supplemental oxygen: Secondary | ICD-10-CM | POA: Insufficient documentation

## 2022-09-24 DIAGNOSIS — Z6841 Body Mass Index (BMI) 40.0 and over, adult: Secondary | ICD-10-CM | POA: Diagnosis not present

## 2022-09-24 DIAGNOSIS — K640 First degree hemorrhoids: Secondary | ICD-10-CM | POA: Diagnosis not present

## 2022-09-24 DIAGNOSIS — D860 Sarcoidosis of lung: Secondary | ICD-10-CM | POA: Insufficient documentation

## 2022-09-24 DIAGNOSIS — K648 Other hemorrhoids: Secondary | ICD-10-CM | POA: Diagnosis not present

## 2022-09-24 DIAGNOSIS — J45909 Unspecified asthma, uncomplicated: Secondary | ICD-10-CM | POA: Insufficient documentation

## 2022-09-24 DIAGNOSIS — D509 Iron deficiency anemia, unspecified: Secondary | ICD-10-CM | POA: Diagnosis not present

## 2022-09-24 HISTORY — PX: POLYPECTOMY: SHX5525

## 2022-09-24 HISTORY — PX: BIOPSY: SHX5522

## 2022-09-24 HISTORY — PX: COLONOSCOPY WITH PROPOFOL: SHX5780

## 2022-09-24 HISTORY — PX: ESOPHAGOGASTRODUODENOSCOPY (EGD) WITH PROPOFOL: SHX5813

## 2022-09-24 LAB — GLUCOSE, CAPILLARY: Glucose-Capillary: 112 mg/dL — ABNORMAL HIGH (ref 70–99)

## 2022-09-24 SURGERY — ESOPHAGOGASTRODUODENOSCOPY (EGD) WITH PROPOFOL
Anesthesia: Monitor Anesthesia Care

## 2022-09-24 MED ORDER — PHENYLEPHRINE 80 MCG/ML (10ML) SYRINGE FOR IV PUSH (FOR BLOOD PRESSURE SUPPORT)
PREFILLED_SYRINGE | INTRAVENOUS | Status: DC | PRN
  Administered 2022-09-24 (×2): 80 ug via INTRAVENOUS

## 2022-09-24 MED ORDER — PROPOFOL 10 MG/ML IV BOLUS
INTRAVENOUS | Status: DC | PRN
  Administered 2022-09-24 (×2): 20 mg via INTRAVENOUS

## 2022-09-24 MED ORDER — LIDOCAINE 2% (20 MG/ML) 5 ML SYRINGE
INTRAMUSCULAR | Status: DC | PRN
  Administered 2022-09-24: 80 mg via INTRAVENOUS

## 2022-09-24 MED ORDER — SODIUM CHLORIDE 0.9 % IV SOLN: INTRAVENOUS | Status: DC

## 2022-09-24 MED ORDER — PROPOFOL 500 MG/50ML IV EMUL
INTRAVENOUS | Status: DC | PRN
  Administered 2022-09-24: 125 ug/kg/min via INTRAVENOUS

## 2022-09-24 SURGICAL SUPPLY — 25 items

## 2022-09-24 NOTE — H&P (Signed)
Cassandra Burns is a 57 year old female presenting for EGD and colonoscopy.  EGD and colonoscopy indication iron deficiency anemia, previously attributed to heavy menstrual cycle. She has medical history significant for pulmonary sarcoidosis, on supplemental oxygen therapy chronically.  Medical history also significant for morbid obesity, sleep apnea, hypertension, diabetes mellitus, depression and asthma..Seen by pulmonology on 07/02/2022, procedural risk stratification completed, acceptable risk.  Last colonoscopy completed 02/27/2011 for rectal bleeding and anemia with findings of internal and external hemorrhoids.  No prior EGD.  No anticoagulants.  No family history colon cancer.  Patient noted that she is at baseline shortness of breath.  No chest pain.  Intermittent palpitation.  No abdominal pain.  Will intermittently see blood per rectum.  I discussed procedure EGD and colonoscopy in detail, benefits of procedure, alternatives to procedure and risks of procedure including bleeding/infection/perforation/missed lesion/anesthesia.  She verbalized understanding and elected to proceed.  Physical exam: General: Awake and alert, nontoxic in appearance Cardio: Regular rate and rhythm Pulm: Anterior lung fields clear to auscultation, supplemental oxygen nasal cannula in place Abdomen: Soft, nontender to palpation, bowel sounds appreciated  Assessment: -Iron deficiency anemia -Blood per rectum  Plan: -Proceed to EGD and colonoscopy today  Danton Clap, DO [facility]

## 2022-09-24 NOTE — Op Note (Signed)
[facility] Patient Name: Cassandra Burns Procedure Date: 09/24/2022 MRN: [id number] Attending MD: Danton Clap DO, DO, [id number] Date of Birth: [birth year] CSN: [id number] Age: 57 Admit Type: Outpatient Procedure:                Upper GI endoscopy Indications:              Iron deficiency anemia Providers:                Golden Pop, Technician , Jamison Neighbor RN, RN Referring MD:              Medicines:                See the Anesthesia note for documentation of the                            administered medications Complications:            No immediate complications. Estimated Blood Loss:     Estimated blood loss was minimal. Procedure:                Pre-Anesthesia Assessment:                           - ASA Grade Assessment: IV - A patient with severe                            systemic disease that is a constant threat to life.                           - The risks and benefits of the procedure and the                            sedation options and risks were discussed with the                            patient. All questions were answered and informed                            consent was obtained.                           After obtaining informed consent, the endoscope was                            passed under direct vision. Throughout the                            procedure, the patient's blood pressure, pulse, and                            oxygen saturations were monitored continuously. The                            [id number] Olympus endoscope was introduced  through the mouth, and advanced to the second part                            of duodenum. The upper GI endoscopy was                            accomplished without difficulty. The patient                            tolerated the procedure well. Scope In: Scope Out: Findings:      Scattered mucosal variance characterized by plaque was found in  the       middle third of the esophagus. Biopsies were taken with a cold forceps       for histology.      Esophagogastric landmarks were identified: the Z-line was found at 40 cm       from the incisors.      Localized mild inflammation characterized by congestion (edema) and       erythema was found in the gastric antrum. Biopsies were taken with a       cold forceps for Helicobacter pylori testing.      The examined duodenum was normal. Biopsies for histology were taken with       a cold forceps for evaluation of celiac disease. Impression:               - Esophageal mucosal variant. Biopsied.                           - Esophagogastric landmarks identified.                           - Gastritis. Biopsied.                           - Normal examined duodenum. Biopsied. Moderate Sedation:      See the other procedure note for documentation of moderate sedation with       intraservice time. Recommendation:           - Await pathology results.                           - Perform a colonoscopy today. Procedure Code(s):        --- Professional ---                           [phone], Esophagogastroduodenoscopy, flexible,                            transoral; with biopsy, single or multiple Diagnosis Code(s):        --- Professional ---                           K22.89, Other specified disease of esophagus                           K29.70, Gastritis, unspecified, without bleeding  D50.9, Iron deficiency anemia, unspecified CPT copyright 2022 American Medical Association. All rights reserved. The codes documented in this report are preliminary and upon coder review may  be revised to meet current compliance requirements. Dr Danton Clap, [place], DO 09/24/2022 9:16:13 AM Number of Addenda: 0

## 2022-09-24 NOTE — Anesthesia Postprocedure Evaluation (Signed)
Anesthesia Post Note  Patient: Cassandra Burns  Procedure(s) Performed: ESOPHAGOGASTRODUODENOSCOPY (EGD) WITH PROPOFOL COLONOSCOPY WITH PROPOFOL BIOPSY POLYPECTOMY     Patient location during evaluation: Endoscopy Anesthesia Type: MAC Level of consciousness: awake Pain management: pain level controlled Vital Signs Assessment: post-procedure vital signs reviewed and stable Respiratory status: spontaneous breathing, nonlabored ventilation and respiratory function stable Cardiovascular status: blood pressure returned to baseline and stable Postop Assessment: no apparent nausea or vomiting Anesthetic complications: no   No notable events documented.  Last Vitals:  Vitals:   09/24/22 0920 09/24/22 0929  BP: 120/67 120/69  Pulse: 90 91  Resp: 19 17  Temp:    SpO2: 90% (!) 89%    Last Pain:  Vitals:   09/24/22 0929  TempSrc:   PainSc: 0-No pain                 Clayden Withem P Naimah Yingst

## 2022-09-24 NOTE — Op Note (Signed)
[facility] Patient Name: Cassandra Burns Procedure Date: 09/24/2022 MRN: [id number] Attending MD: Danton Clap DO, DO, [id number] Date of Birth: [birth year] CSN: [id number] Age: 57 Admit Type: Outpatient Procedure:                Colonoscopy Indications:              Rectal bleeding, Iron deficiency anemia Providers:                Danton Clap DO, DO, Jamison Neighbor RN, RN,                            Fransico Setters Mbumina, Technician Referring MD:              Medicines:                See the Anesthesia note for documentation of the                            administered medications Complications:            No immediate complications. Estimated Blood Loss:     Estimated blood loss was minimal. Procedure:                Pre-Anesthesia Assessment:                           - ASA Grade Assessment: IV - A patient with severe                            systemic disease that is a constant threat to life.                           - The risks and benefits of the procedure and the                            sedation options and risks were discussed with the                            patient. All questions were answered and informed                            consent was obtained.                           After obtaining informed consent, the colonoscope                            was passed under direct vision. Throughout the                            procedure, the patient's blood pressure, pulse, and                            oxygen saturations were monitored continuously. The  [id number]) Olympus colonoscope was                            introduced through the anus and advanced to the the                            terminal ileum, with identification of the                            appendiceal orifice and IC valve. The colonoscopy                            was somewhat difficult due to significant looping.                             Successful completion of the procedure was aided by                            straightening and shortening the scope to obtain                            bowel loop reduction and applying abdominal                            pressure. The patient tolerated the procedure well.                            The quality of the bowel preparation was evaluated                            using the BBPS [facility] Bowel Preparation Scale)                            with scores of: Right Colon = 3, Transverse Colon =                            3 and Left Colon = 3 (entire mucosa seen well with                            no residual staining, small fragments of stool or                            opaque liquid). The total BBPS score equals 9. Scope In: 8:44:58 AM Scope Out: 9:00:21 AM Scope Withdrawal Time: 0 hours 10 minutes 6 seconds  Total Procedure Duration: 0 hours 15 minutes 23 seconds  Findings:      The digital rectal exam findings include non-thrombosed external       hemorrhoids and internal hemorrhoids (Grade I).      A 4 mm polyp was found in the ascending colon. The polyp was sessile.       The polyp was removed with a cold snare. Resection and retrieval were       complete.      The terminal ileum appeared  normal. Impression:               - No specimens collected. Moderate Sedation:      See the other procedure note for documentation of moderate sedation with       intraservice time. Recommendation:           - Discharge patient to home.                           - Resume previous diet.                           - Await pathology results.                           - To visualize the small bowel, perform video                            capsule endoscopy at appointment to be scheduled.                           - Telephone GI office for pathology results in 2                            weeks.                           - Return to GI office in 2 months. Procedure Code(s):         --- Professional ---                           [phone], Colonoscopy, flexible; with removal of                            tumor(s), polyp(s), or other lesion(s) by snare                            technique Diagnosis Code(s):        --- Professional ---                           K62.5, Hemorrhage of anus and rectum                           D50.9, Iron deficiency anemia, unspecified CPT copyright 2022 American Medical Association. All rights reserved. The codes documented in this report are preliminary and upon coder review may  be revised to meet current compliance requirements. Dr Danton Clap, [place], DO 09/24/2022 9:22:28 AM Number of Addenda: 0

## 2022-09-24 NOTE — Anesthesia Preprocedure Evaluation (Addendum)
Anesthesia Evaluation  Patient identified by MRN, date of birth, ID band Patient awake    Reviewed: Allergy & Precautions, NPO status , Patient's Chart, lab work & pertinent test results  Airway Mallampati: III  TM Distance: >3 FB Neck ROM: Full    Dental no notable dental hx.    Pulmonary asthma , sleep apnea, Continuous Positive Airway Pressure Ventilation and Oxygen sleep apnea , former smoker   Pulmonary exam normal        Cardiovascular hypertension, Pt. on medications and Pt. on home beta blockers Normal cardiovascular exam     Neuro/Psych  PSYCHIATRIC DISORDERS Anxiety Depression    negative neurological ROS     GI/Hepatic negative GI ROS, Neg liver ROS,,,  Endo/Other  diabetes  Morbid obesity  Renal/GU Renal disease     Musculoskeletal Gout   Abdominal   Peds  Hematology negative hematology ROS (+)   Anesthesia Other Findings Iron Deficiency Colon Screening  Reproductive/Obstetrics                             Anesthesia Physical Anesthesia Plan  ASA: 4  Anesthesia Plan: MAC   Post-op Pain Management:    Induction: Intravenous  PONV Risk Score and Plan: 2 and Propofol infusion and Treatment may vary due to age or medical condition  Airway Management Planned: Nasal Cannula  Additional Equipment:   Intra-op Plan:   Post-operative Plan:   Informed Consent: I have reviewed the patients History and Physical, chart, labs and discussed the procedure including the risks, benefits and alternatives for the proposed anesthesia with the patient or authorized representative who has indicated his/her understanding and acceptance.     Dental advisory given  Plan Discussed with: CRNA  Anesthesia Plan Comments:        Anesthesia Quick Evaluation

## 2022-09-24 NOTE — Transfer of Care (Signed)
Immediate Anesthesia Transfer of Care Note  Patient: Cassandra Burns  Procedure(s) Performed: ESOPHAGOGASTRODUODENOSCOPY (EGD) WITH PROPOFOL COLONOSCOPY WITH PROPOFOL BIOPSY POLYPECTOMY  Patient Location: PACU  Anesthesia Type:MAC  Level of Consciousness: awake, alert , and oriented  Airway & Oxygen Therapy: Patient Spontanous Breathing and Patient connected to nasal cannula oxygen  Post-op Assessment: Report given to RN and Post -op Vital signs reviewed and stable  Post vital signs: Reviewed and stable  Last Vitals:  Vitals Value Taken Time  BP    Temp    Pulse    Resp    SpO2      Last Pain:  Vitals:   09/24/22 0741  TempSrc: Temporal  PainSc:          Complications: No notable events documented.

## 2022-09-25 ENCOUNTER — Encounter (HOSPITAL_COMMUNITY): Payer: Self-pay | Admitting: Internal Medicine

## 2022-09-27 LAB — SURGICAL PATHOLOGY

## 2022-10-28 ENCOUNTER — Ambulatory Visit: Payer: Medicare PPO | Admitting: Nurse Practitioner

## 2022-11-11 ENCOUNTER — Encounter: Payer: Self-pay | Admitting: Family

## 2022-11-11 ENCOUNTER — Other Ambulatory Visit: Payer: Self-pay | Admitting: Family

## 2022-11-11 DIAGNOSIS — R0989 Other specified symptoms and signs involving the circulatory and respiratory systems: Secondary | ICD-10-CM

## 2022-11-11 DIAGNOSIS — M79604 Pain in right leg: Secondary | ICD-10-CM

## 2022-11-14 ENCOUNTER — Encounter (HOSPITAL_COMMUNITY): Payer: Self-pay | Admitting: Emergency Medicine

## 2022-11-14 ENCOUNTER — Emergency Department (HOSPITAL_BASED_OUTPATIENT_CLINIC_OR_DEPARTMENT_OTHER): Payer: Medicare PPO

## 2022-11-14 ENCOUNTER — Other Ambulatory Visit: Payer: Self-pay

## 2022-11-14 ENCOUNTER — Emergency Department (HOSPITAL_COMMUNITY)
Admission: EM | Admit: 2022-11-14 | Discharge: 2022-11-14 | Disposition: A | Discharge status: Home / Self Care | Payer: Medicare PPO | Attending: Emergency Medicine | Admitting: Emergency Medicine

## 2022-11-14 DIAGNOSIS — M79661 Pain in right lower leg: Secondary | ICD-10-CM

## 2022-11-14 DIAGNOSIS — Z79899 Other long term (current) drug therapy: Secondary | ICD-10-CM | POA: Insufficient documentation

## 2022-11-14 DIAGNOSIS — M79604 Pain in right leg: Secondary | ICD-10-CM | POA: Diagnosis not present

## 2022-11-14 DIAGNOSIS — R202 Paresthesia of skin: Secondary | ICD-10-CM

## 2022-11-14 DIAGNOSIS — M79605 Pain in left leg: Secondary | ICD-10-CM | POA: Diagnosis not present

## 2022-11-14 DIAGNOSIS — E119 Type 2 diabetes mellitus without complications: Secondary | ICD-10-CM | POA: Diagnosis not present

## 2022-11-14 DIAGNOSIS — Z794 Long term (current) use of insulin: Secondary | ICD-10-CM | POA: Insufficient documentation

## 2022-11-14 LAB — CBC WITH DIFFERENTIAL/PLATELET
Abs Immature Granulocytes: 0.01 10*3/uL (ref 0.00–0.07)
Basophils Absolute: 0 10*3/uL (ref 0.0–0.1)
Basophils Relative: 1 %
Eosinophils Absolute: 0.2 10*3/uL (ref 0.0–0.5)
Eosinophils Relative: 4 %
HCT: 37 % (ref 36.0–46.0)
Hemoglobin: 10.5 g/dL — ABNORMAL LOW (ref 12.0–15.0)
Immature Granulocytes: 0 %
Lymphocytes Relative: 29 %
Lymphs Abs: 1.7 10*3/uL (ref 0.7–4.0)
MCH: 20.6 pg — ABNORMAL LOW (ref 26.0–34.0)
MCHC: 28.4 g/dL — ABNORMAL LOW (ref 30.0–36.0)
MCV: 72.7 fL — ABNORMAL LOW (ref 80.0–100.0)
Monocytes Absolute: 0.5 10*3/uL (ref 0.1–1.0)
Monocytes Relative: 9 %
Neutro Abs: 3.4 10*3/uL (ref 1.7–7.7)
Neutrophils Relative %: 57 %
Platelets: 275 10*3/uL (ref 150–400)
RBC: 5.09 MIL/uL (ref 3.87–5.11)
RDW: 21.8 % — ABNORMAL HIGH (ref 11.5–15.5)
WBC: 5.9 10*3/uL (ref 4.0–10.5)
nRBC: 0 % (ref 0.0–0.2)

## 2022-11-14 LAB — COMPREHENSIVE METABOLIC PANEL
ALT: 23 U/L (ref 0–44)
AST: 21 U/L (ref 15–41)
Albumin: 3.9 g/dL (ref 3.5–5.0)
Alkaline Phosphatase: 102 U/L (ref 38–126)
Anion gap: 10 (ref 5–15)
BUN: 27 mg/dL — ABNORMAL HIGH (ref 6–20)
CO2: 23 mmol/L (ref 22–32)
Calcium: 9.6 mg/dL (ref 8.9–10.3)
Chloride: 107 mmol/L (ref 98–111)
Creatinine, Ser: 1.26 mg/dL — ABNORMAL HIGH (ref 0.44–1.00)
GFR, Estimated: 50 mL/min — ABNORMAL LOW (ref >60)
Glucose, Bld: 133 mg/dL — ABNORMAL HIGH (ref 70–99)
Potassium: 4.4 mmol/L (ref 3.5–5.1)
Sodium: 140 mmol/L (ref 135–145)
Total Bilirubin: 0.5 mg/dL (ref 0.3–1.2)
Total Protein: 8.2 g/dL — ABNORMAL HIGH (ref 6.5–8.1)

## 2022-11-14 NOTE — ED Provider Notes (Signed)
[place] EMERGENCY DEPARTMENT AT [facility] Provider Note   CSN: [id number] Arrival date & time: 11/14/22  4098     History  Chief Complaint  Patient presents with   Leg Pain   Abnormal Labs    Cassandra Burns is a 57 y.o. female.  57 year old female presents with bilateral leg discomfort.  History of diabetes as well as peripheral neuropathy.  Denies any leg pain itself.  Has not had any chest pain or shortness of breath.  Had blood work done at her doctor's office yesterday and was called today and told that she had to come to the ED for ultrasound of her leg.  Denies any prior history of DVT.  No PE symptoms.  Does take gabapentin       Home Medications Prior to Admission medications   Medication Sig Start Date End Date Taking? Authorizing Provider  albuterol (PROVENTIL) (2.5 MG/3ML) 0.083% nebulizer solution Take 3 mLs (2.5 mg total) by nebulization every 6 (six) hours as needed for wheezing or shortness of breath. 05/19/17   Bevelyn Ngo, NP  albuterol (VENTOLIN HFA) 108 (90 Base) MCG/ACT inhaler Inhale 1-2 puffs into the lungs every 6 (six) hours as needed for wheezing or shortness of breath. 11/09/19   Glenford Bayley, NP  allopurinol (ZYLOPRIM) 100 MG tablet Take 100 mg by mouth in the morning.    [provider]  amLODipine (NORVASC) 10 MG tablet Take 1 tablet (10 mg total) by mouth daily after lunch. Patient taking differently: Take 10 mg by mouth in the morning. 09/30/21   Meredeth Ide, MD  cyclobenzaprine (FLEXERIL) 5 MG tablet Take 1 tablet (5 mg total) by mouth 2 (two) times daily as needed for muscle spasms. 07/14/22   Wynetta Fines, MD  Fluticasone-Umeclidin-Vilant (TRELEGY ELLIPTA) 200-62.5-25 MCG/ACT AEPB Inhale 1 puff into the lungs daily.    [provider]  furosemide (LASIX) 20 MG tablet Take 1 tablet (20 mg total) by mouth daily. Patient taking differently: Take 20 mg by mouth daily as needed for edema. 04/27/21  09/28/22  Narda Bonds, MD  gabapentin (NEURONTIN) 800 MG tablet Take 800 mg by mouth 3 (three) times daily. 09/14/20   [provider]  labetalol (NORMODYNE) 100 MG tablet Take 100 mg by mouth in the morning. 10/27/19   [provider]  losartan-hydrochlorothiazide (HYZAAR) 100-25 MG tablet Take 1 tablet by mouth in the morning.    [provider]  MOUNJARO 10 MG/0.5ML Pen Inject 10 mg into the skin every Tuesday. 03/21/22   [provider]  OXYGEN Inhale 2-3 L into the lungs continuous.    [provider]  rosuvastatin (CRESTOR) 40 MG tablet Take 1 tablet by mouth once daily 07/30/22   Lewayne Bunting, MD  SYNJARDY XR 25-1000 MG TB24 Take 1 tablet by mouth in the morning. 09/25/21   [provider]  TOUJEO SOLOSTAR 300 UNIT/ML Solostar Pen Inject 40 Units into the skin 2 (two) times daily after a meal. Patient taking differently: Inject 65 Units into the skin 2 (two) times daily after a meal. 04/27/21   Narda Bonds, MD      Allergies    Oxycontin [oxycodone]    Review of Systems   Review of Systems  All other systems reviewed and are negative.   Physical Exam Updated Vital Signs BP (!) 163/87   Pulse 93   Temp 97.6 F (36.4 C) (Oral)   Resp 20   Ht  1.499 m ( )   Wt 99.8 kg   LMP 06/13/2012   SpO2 93%   BMI 44.43 kg/m  Physical Exam Vitals and nursing note reviewed.  Constitutional:      General: She is not in acute distress.    Appearance: Normal appearance. She is well-developed. She is not toxic-appearing.  HENT:     Head: Normocephalic and atraumatic.  Eyes:     General: Lids are normal.     Conjunctiva/sclera: Conjunctivae normal.     Pupils: Pupils are equal, round, and reactive to light.  Neck:     Thyroid: No thyroid mass.     Trachea: No tracheal deviation.  Cardiovascular:     Rate and Rhythm: Normal rate and regular rhythm.     Heart sounds: Normal heart sounds. No murmur heard.    No gallop.   Pulmonary:     Effort: Pulmonary effort is normal. No respiratory distress.     Breath sounds: Normal breath sounds. No stridor. No decreased breath sounds, wheezing, rhonchi or rales.  Abdominal:     General: There is no distension.     Palpations: Abdomen is soft.     Tenderness: There is no abdominal tenderness. There is no rebound.  Musculoskeletal:        General: No tenderness. Normal range of motion.     Cervical back: Normal range of motion and neck supple.     Comments: Neurovasc intact in both feet  Skin:    General: Skin is warm and dry.     Findings: No abrasion or rash.  Neurological:     Mental Status: She is alert and oriented to person, place, and time. Mental status is at baseline.     GCS: GCS eye subscore is 4. GCS verbal subscore is 5. GCS motor subscore is 6.     Cranial Nerves: No cranial nerve deficit.     Sensory: No sensory deficit.     Motor: Motor function is intact.  Psychiatric:        Attention and Perception: Attention normal.        Speech: Speech normal.        Behavior: Behavior normal.     ED Results / Procedures / Treatments   Labs (all labs ordered are listed, but only abnormal results are displayed) Labs Reviewed  CBC WITH DIFFERENTIAL/PLATELET  COMPREHENSIVE METABOLIC PANEL    EKG None  Radiology No results found.  Procedures Procedures    Medications Ordered in ED Medications - No data to display  ED Course/ Medical Decision Making/ A&P                             Medical Decision Making Amount and/or Complexity of Data Reviewed Labs: ordered.   Patient had Dopplers of her lower extremities which per interpretation showed no acute findings.  Patient's electrolytes show improved kidney function.  Suspect that patient symptoms of for her known neuropathy.  Will discharge home        Final Clinical Impression(s) / ED Diagnoses Final diagnoses:  None    Rx / DC Orders ED Discharge Orders     None          Lorre Nick, MD 11/14/22 1310

## 2022-11-14 NOTE — ED Triage Notes (Signed)
Patient arrives ambulatory by POV states she went to her doctor last week due to left leg "felt like it was cooking." Patient states she had labs drawn and was told to come here. Patient unsure what labs were abnormal. Patient on 2L [place] continuously. Patient states they also started her on a new medicine for restless legs and feels like that has improved her leg pain.

## 2022-11-14 NOTE — Progress Notes (Addendum)
VASCULAR LAB    Bilateral lower extremity venous duplex has been performed.  See CV proc for preliminary results.  Messaged negative results to Dr. Freida Busman via secure chat  Sherren Kerns, RVT 11/14/2022, 12:26 PM

## 2022-12-13 ENCOUNTER — Ambulatory Visit: Payer: Medicare HMO | Admitting: Podiatry

## 2022-12-13 DIAGNOSIS — M79675 Pain in left toe(s): Secondary | ICD-10-CM | POA: Diagnosis not present

## 2022-12-13 DIAGNOSIS — B351 Tinea unguium: Secondary | ICD-10-CM | POA: Diagnosis not present

## 2022-12-13 DIAGNOSIS — M79674 Pain in right toe(s): Secondary | ICD-10-CM

## 2022-12-13 DIAGNOSIS — M792 Neuralgia and neuritis, unspecified: Secondary | ICD-10-CM | POA: Diagnosis not present

## 2022-12-13 DIAGNOSIS — E1142 Type 2 diabetes mellitus with diabetic polyneuropathy: Secondary | ICD-10-CM

## 2022-12-13 MED ORDER — CICLOPIROX 8 % EX SOLN: Freq: Every day | CUTANEOUS | 0 refills | Status: DC

## 2022-12-13 NOTE — Progress Notes (Signed)
  Subjective:  Patient ID: Cassandra Burns, female    DOB: [birth year],  MRN: [id number]  Chief Complaint  Patient presents with   Peripheral Neuropathy    Diabetic neuropathy.Latest A1C was 8.0 (3 months ago). Patient is currently on Gabapentin 800mg  TID.    Nail Problem    Discoloration to bilateral feet. Patient has not tried any OTC medications for nail fungus.   Nail trim     57 y.o. female presents with concern for diabetic peripheral neuropathy pain.  She is taking gabapentin 800 mg 3 times daily.  She has tried Lyrica and other remedies as well with no success.  Past Medical History:  Diagnosis Date   Anxiety    Asthma    Bone pain 06/07/2013   Depression    Diabetes mellitus 12/18/2012   NIDDM   Hernia of abdominal cavity 10/25/2013   History of anemia 07/26/2013   Hypertension    Iron deficiency anemia due to chronic blood loss    Lymphadenopathy 06/07/2013   Obesity 12/18/2012   Psoriasis    Sarcoidosis 07/26/2013   Sleep apnea    uses bipap    Allergies  Allergen Reactions   Oxycontin [Oxycodone]     "Too sleepy; had to give me something to knock it out last time I was in hospital"    ROS: Negative except as per HPI above  Objective:  General: AAO x3, NAD  Dermatological: Onychomycosis including thickening fungal dystrophy discoloration  of the nails x 5 bilateral foot.  Pain due to onychomycosis noted.  Vascular:  Dorsalis Pedis artery and Posterior Tibial artery pedal pulses are 2/4 bilateral.  Capillary fill time < 3 sec to all digits.   Neruologic: Grossly absent via light touch and subjective sensation pins-and-needles and burning pain over the bilateral foot globally to the level of the ankle and into the leg  Musculoskeletal: No gross boney pedal deformities bilateral. No pain, crepitus, or limitation noted with foot and ankle range of motion bilateral. Muscular strength 5/5 in all groups tested bilateral.  Gait: Unassisted, Nonantalgic.    No images are attached to the encounter.  Assessment:   1. Neuropathic pain   2. Pain due to onychomycosis of toenails of both feet   3. DM type 2 with diabetic peripheral neuropathy (HCC)      Plan:  Patient was evaluated and treated and all questions answered.  # Diabetic peripheral neuropathic pain -Discussed with patient she does have evidence of neuropathic pain and given her symptoms this is her primary cause of pain in the bilateral foot -As she has failed multiple medication therapies I recommend referral to neurology for further evaluation -Discussed that they may be able to help with changing medications or other treatment options including injections or stimulators that could help with management of this severe neuropathic pain  #Onychomycosis with pain  -Nails palliatively debrided as below. -Educated on self-care -E Rx for Penlac 8% topical solution apply once daily to all nails sent the patient's pharmacy  Procedure: Nail Debridement Rationale: Pain Type of Debridement: manual, sharp debridement. Instrumentation: Nail nipper, rotary burr. Number of Nails: 10  No follow-ups on file.   Return in about 3 months (around 03/15/2023) for f/u neuropathy and nail care.          Corinna Gab, DPM [facility] / [facility]

## 2023-01-01 ENCOUNTER — Other Ambulatory Visit: Payer: Medicare PPO

## 2023-01-04 NOTE — Progress Notes (Unsigned)
   Cardiology Clinic Note   Date: 01/04/2023 ID: COREN CROWNOVER, DOB 08/01/1965, MRN [id number]  Primary Cardiologist:  None  Patient Profile    Cassandra Burns is a 57 y.o. female who presents to the clinic today for ***  Past medical history significant for: Coronary artery calcification/aortic atherosclerosis. 01/18/2020: Atherosclerotic calcification of the aorta and coronary arteries.  Heart is mildly enlarged. Diastolic heart failure. Echo 04/24/2021: EF 65 to 70%.  Severe LVH.  Grade I DD.  Normal RV function.  No significant valvular abnormalities. Hypertension. Hyperlipidemia. Panel 04/18/2022: LDL 58, HDL 31, TG 231, total 118. OSA. Pulmonary sarcoidosis. T2DM.   History of Present Illness    Cassandra Burns elevated by Dr. Jens Burns on 06/11/2021 for hypertension and dyspnea at the request of Dr. Concepcion Burns.  CPX performed August 2021 showed severe functional limitation primarily pulmonary with severe hypertensive response.  Patient underwent hospital admission September 2022 with CHF, hypertension and respiratory failure.  She was diuresed, treated with antibiotics and blood pressure medications were adjusted.  Echo showed normal LV function, severe LVH, Grade I DD.  Since she was discharged from the hospital she reported continued DOE but improved from prior.  Patient was last evaluated by Dr. Jens Burns via video visit on 06/28/2022.  She.  Euvolemic at that time and reported blood pressure was well-controlled.  No changes were made at that time.  Today, patient ***  Coronary artery calcification/aortic atherosclerosis.  Found on chest CT June 2021.  Patient*** Continue rosuvastatin. Chronic diastolic heart failure.  Echo September 2022 showed normal LV/RV function, severe LVH, Grade I DD.  Patient*** Euvolemic and well compensated on exam.  Continue Hyzaar, labetalol, Lasix. Hypertension. BP today *** Patient denies headaches, dizziness or vision changes.  Continue amlodipine, labetalol, Hyzaar. Hyperlipidemia.  LDL September 2023 58, at goal.  Continue rosuvastatin.  ROS: All other systems reviewed and are otherwise negative except as noted in History of Present Illness.  Studies Reviewed    ECG personally reviewed by me today: ***  No significant changes from ***  Risk Assessment/Calculations    {Does this patient have ATRIAL FIBRILLATION?:[phone]} No BP recorded.  {Refresh Note OR Click here to enter BP  :1}***        Physical Exam    VS:  LMP 06/13/2012  , BMI There is no height or weight on file to calculate BMI.  GEN: Well nourished, well developed, in no acute distress. Neck: No JVD or carotid bruits. Cardiac: *** RRR. No murmurs. No rubs or gallops.   Respiratory:  Respirations regular and unlabored. Clear to auscultation without rales, wheezing or rhonchi. GI: Soft, nontender, nondistended. Extremities: Radials/DP/PT 2+ and equal bilaterally. No clubbing or cyanosis. No edema ***  Skin: Warm and dry, no rash. Neuro: Strength intact.  Assessment & Plan   ***  Disposition: ***     {Are you ordering a CV Procedure (e.g. stress test, cath, DCCV, TEE, etc)?   Press F2        :[id number]}   Signed, Cassandra Burns. Jazlyne Gauger, DNP, NP-C

## 2023-01-06 ENCOUNTER — Ambulatory Visit: Payer: Medicare PPO | Admitting: Nurse Practitioner

## 2023-01-07 ENCOUNTER — Encounter: Payer: Self-pay | Admitting: Student

## 2023-01-07 ENCOUNTER — Ambulatory Visit: Payer: Medicare HMO | Attending: Student | Admitting: Student

## 2023-01-07 VITALS — BP 138/62 | HR 85 | Ht 59.0 in | Wt 224.4 lb

## 2023-01-07 DIAGNOSIS — I10 Essential (primary) hypertension: Secondary | ICD-10-CM

## 2023-01-07 DIAGNOSIS — I2510 Atherosclerotic heart disease of native coronary artery without angina pectoris: Secondary | ICD-10-CM

## 2023-01-07 DIAGNOSIS — I5032 Chronic diastolic (congestive) heart failure: Secondary | ICD-10-CM | POA: Diagnosis not present

## 2023-01-07 DIAGNOSIS — I2584 Coronary atherosclerosis due to calcified coronary lesion: Secondary | ICD-10-CM | POA: Diagnosis not present

## 2023-01-07 DIAGNOSIS — E785 Hyperlipidemia, unspecified: Secondary | ICD-10-CM | POA: Diagnosis not present

## 2023-01-07 MED ORDER — FUROSEMIDE 20 MG PO TABS: 20.0000 mg | ORAL_TABLET | Freq: Every day | ORAL | 6 refills | Status: DC | PRN

## 2023-01-07 NOTE — Patient Instructions (Addendum)
Medication Instructions:  Your physician recommends that you continue on your current medications as directed. Please refer to the Current Medication list given to you today.  *If you need a refill on your cardiac medications before your next appointment, please call your pharmacy*  Please weigh yourself daily, If you gain 3lbs overnight of 5lbs in a week please be sure to take an extra lasix tablet.    Lab Work: NONE If you have labs (blood work) drawn today and your tests are completely normal, you will receive your results only by: MyChart Message (if you have MyChart) OR A paper copy in the mail If you have any lab test that is abnormal or we need to change your treatment, we will call you to review the results.   Testing/Procedures: NONE   Follow-Up: At [facility], you and your health needs are our priority.  As part of our continuing mission to provide you with exceptional heart care, we have created designated Provider Care Teams.  These Care Teams include your primary Cardiologist (physician) and Advanced Practice Providers (APPs -  Physician Assistants and Nurse Practitioners) who all work together to provide you with the care you need, when you need it.  We recommend signing up for the patient portal called "MyChart".  Sign up information is provided on this After Visit Summary.  MyChart is used to connect with patients for Virtual Visits (Telemedicine).  Patients are able to view lab/test results, encounter notes, upcoming appointments, etc.  Non-urgent messages can be sent to your provider as well.   To learn more about what you can do with MyChart, go to [email or web address].    Your next appointment:   6 month(s)  Provider:   Olga Millers, MD

## 2023-01-24 ENCOUNTER — Ambulatory Visit
Admission: RE | Admit: 2023-01-24 | Discharge: 2023-01-24 | Disposition: A | Discharge status: Home / Self Care | Payer: Medicare HMO | Source: Ambulatory Visit | Attending: Family

## 2023-01-24 DIAGNOSIS — R0989 Other specified symptoms and signs involving the circulatory and respiratory systems: Secondary | ICD-10-CM

## 2023-01-24 DIAGNOSIS — M79604 Pain in right leg: Secondary | ICD-10-CM

## 2023-02-03 ENCOUNTER — Ambulatory Visit
Admission: RE | Admit: 2023-02-03 | Discharge: 2023-02-03 | Disposition: A | Discharge status: Home / Self Care | Payer: Medicare HMO | Source: Ambulatory Visit | Attending: Internal Medicine | Admitting: Internal Medicine

## 2023-02-03 DIAGNOSIS — Z1239 Encounter for other screening for malignant neoplasm of breast: Secondary | ICD-10-CM

## 2023-02-12 ENCOUNTER — Ambulatory Visit: Payer: Medicare PPO | Admitting: Nurse Practitioner

## 2023-03-14 ENCOUNTER — Ambulatory Visit: Payer: Medicare HMO | Admitting: Podiatry

## 2023-03-25 ENCOUNTER — Ambulatory Visit: Payer: Medicare HMO | Admitting: Neurology

## 2023-03-25 ENCOUNTER — Encounter: Payer: Self-pay | Admitting: Neurology

## 2023-03-25 VITALS — BP 142/95 | HR 89 | Resp 16 | Ht 59.0 in | Wt 226.0 lb

## 2023-03-25 DIAGNOSIS — R269 Unspecified abnormalities of gait and mobility: Secondary | ICD-10-CM | POA: Diagnosis not present

## 2023-03-25 DIAGNOSIS — G629 Polyneuropathy, unspecified: Secondary | ICD-10-CM | POA: Diagnosis not present

## 2023-03-25 DIAGNOSIS — M792 Neuralgia and neuritis, unspecified: Secondary | ICD-10-CM

## 2023-03-25 MED ORDER — LIDOCAINE-PRILOCAINE 2.5-2.5 % EX CREA: TOPICAL_CREAM | CUTANEOUS | 11 refills | Status: DC

## 2023-03-25 MED ORDER — DULOXETINE HCL 60 MG PO CPEP: 60.0000 mg | ORAL_CAPSULE | Freq: Every day | ORAL | 11 refills | Status: AC

## 2023-03-25 MED ORDER — NORTRIPTYLINE HCL 25 MG PO CAPS: 50.0000 mg | ORAL_CAPSULE | Freq: Every day | ORAL | 11 refills | Status: DC

## 2023-03-25 MED ORDER — DULOXETINE HCL 30 MG PO CPEP: 30.0000 mg | ORAL_CAPSULE | Freq: Every day | ORAL | 0 refills | Status: DC

## 2023-03-25 NOTE — Progress Notes (Signed)
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 female   Neuropathy Neuropathic pain  Only mildly length-dependent  sensory changes, brisk reflexes, suggestive small fiber involvement  Laboratory evaluation for etiology most likely related to her diabetes,  Keep gabapentin 800 mg 3 times a day, add on Cymbalta 30 mg titrating  to 60 mg daily, also nortriptyline 25 mg 2 tablets every night, Emla gel  Return To Clinic With NP In 6 Months she has brisk reflex on examination, I also concerned about possibility of cervical spondylitic myelopathy, if she complains of worsening gait abnormality bone bladder issues, will order MRI of cervical spine  DIAGNOSTIC DATA (LABS, IMAGING, TESTING) - I reviewed patient records, labs, notes, testing and imaging myself where available.   MEDICAL HISTORY:  Cassandra Burns, is a 57 year old female, seen in request by her primary care from [facility] [place], [place], for evaluation of neuropathy neuropathic pain, initial evaluation March 25, 2023  I reviewed and summarized the referring note. PMHX. DM, since 2011, insulin dependent  HTN Obesity Psoriasis Sarcoidosis Sleep apnea  She has been on disability for many years, diagnosis of diabetes and insulin-dependent for more than a decade, more than 10 years ago, she began to notice bilateral feet paresthesia, burning sensation, painful, a year later began to notice bilateral fingertip involvement, itching burning sensation, over the years, her paresthesia comes and goes, overall getting worse, especially over the past few years, to the point that it is affecting her sleep, daily activity, she describes intense itching while driving, she has to scratch her feet all the time,  She was put on  gabapentin 800 mg 3 times a day, provided partial relief, tried Lyrica unsure dosage, was not sure about the benefit  She has obesity, noticed mild gait abnormality, also carried a diagnosis of pulmonary sarcoidosis, during flareup shortness of breath, often was treated with short course of steroid, also carries a diagnosis of psoriasis, not on any treatment   PHYSICAL EXAM:   Vitals:   03/25/23 0816  BP: (!) 142/95  Pulse: 89  Resp: 16  Weight: 226 lb (102.5 kg)  Height: 4\' 11"  (1.499 m)   Body mass index is 45.65 kg/m.  PHYSICAL EXAMNIATION:  Gen: NAD, conversant, well nourised, well groomed                     Cardiovascular: Regular rate rhythm, no peripheral edema, warm, nontender. Eyes: Conjunctivae clear without exudates or hemorrhage Neck: Supple, no carotid bruits. Pulmonary: Clear to auscultation bilaterally   NEUROLOGICAL EXAM:  MENTAL STATUS: Speech/cognition: Awake, alert, oriented to history taking and casual conversation CRANIAL NERVES: CN II: Visual fields are full to confrontation. Pupils are round equal and briskly reactive to light. CN III, IV, VI: extraocular movement are normal. No ptosis. CN V: Facial sensation is intact to light touch CN VII: Face is symmetric with normal eye closure  CN VIII: Hearing is normal to causal conversation. CN IX, X: Phonation is normal. CN XI: Head turning and shoulder shrug are intact  MOTOR: There is no pronator drift of out-stretched arms. Muscle bulk and tone are normal. Muscle strength is normal.  REFLEXES: Reflexes are 2+ and symmetric at the biceps, triceps, knees, and ankles. Plantar responses are flexor.  SENSORY: Preserved  to toe vibratory sensation, proprioception, intact to light touch pinprick  COORDINATION: There is no trunk or limb dysmetria noted.  GAIT/STANCE: Need push-up to get up from seated position, limited by her big body habitus  REVIEW OF SYSTEMS:  Full 14 system review of systems  performed and notable only for as above All other review of systems were negative.   ALLERGIES: Allergies  Allergen Reactions   Oxycontin [Oxycodone]     "Too sleepy; had to give me something to knock it out last time I was in hospital"    HOME MEDICATIONS: Current Outpatient Medications  Medication Sig Dispense Refill   albuterol (PROVENTIL) (2.5 MG/3ML) 0.083% nebulizer solution Take 3 mLs (2.5 mg total) by nebulization every 6 (six) hours as needed for wheezing or shortness of breath. 75 mL 12   albuterol (VENTOLIN HFA) 108 (90 Base) MCG/ACT inhaler Inhale 1-2 puffs into the lungs every 6 (six) hours as needed for wheezing or shortness of breath. 18 g 6   allopurinol (ZYLOPRIM) 100 MG tablet Take 100 mg by mouth in the morning.     amLODipine (NORVASC) 10 MG tablet Take 1 tablet (10 mg total) by mouth daily after lunch. (Patient taking differently: Take 10 mg by mouth in the morning.) 30 tablet 3   ciclopirox (PENLAC) 8 % solution Apply topically at bedtime. Apply over nail and surrounding skin. Apply daily over previous coat. After seven (7) days, may remove with alcohol and continue cycle. 6.6 mL 0   cyclobenzaprine (FLEXERIL) 5 MG tablet Take 1 tablet (5 mg total) by mouth 2 (two) times daily as needed for muscle spasms. 15 tablet 0   Fluticasone-Umeclidin-Vilant (TRELEGY ELLIPTA) 200-62.5-25 MCG/ACT AEPB Inhale 1 puff into the lungs daily.     furosemide (LASIX) 20 MG tablet Take 1 tablet (20 mg total) by mouth daily as needed for edema. 30 tablet 6   gabapentin (NEURONTIN) 800 MG tablet Take 800 mg by mouth 3 (three) times daily.     labetalol (NORMODYNE) 100 MG tablet Take 100 mg by mouth in the morning.     losartan-hydrochlorothiazide (HYZAAR) 100-25 MG tablet Take 1 tablet by mouth in the morning.     MOUNJARO 10 MG/0.5ML Pen Inject 10 mg into the skin every Tuesday.     OXYGEN Inhale 2-3 L into the lungs continuous.     rosuvastatin (CRESTOR) 40 MG tablet Take 1 tablet by  mouth once daily 90 tablet 2   SYNJARDY XR 25-1000 MG TB24 Take 1 tablet by mouth in the morning.     TOUJEO SOLOSTAR 300 UNIT/ML Solostar Pen Inject 40 Units into the skin 2 (two) times daily after a meal. (Patient taking differently: Inject 65 Units into the skin 2 (two) times daily after a meal.)     No current facility-administered medications for this visit.    PAST MEDICAL HISTORY: Past Medical History:  Diagnosis Date   Anxiety    Asthma    Bone pain 06/07/2013   Depression    Diabetes mellitus 12/18/2012   NIDDM   Hernia of abdominal cavity 10/25/2013   History of anemia 07/26/2013   Hypertension    Iron deficiency anemia due to chronic blood loss    Lymphadenopathy 06/07/2013   Obesity 12/18/2012   Psoriasis    Sarcoidosis 07/26/2013   Sleep apnea    uses bipap    PAST SURGICAL HISTORY: Past Surgical History:  Procedure Laterality Date   BIOPSY  09/24/2022   Procedure: BIOPSY;  Surgeon: Lorenso Quarry,  Heinz Knuckles, DO;  Location: [facility];  Service: Gastroenterology;;  esophagogastroduodenoscopy   COLONOSCOPY WITH PROPOFOL N/A 09/24/2022   Procedure: COLONOSCOPY WITH PROPOFOL;  Surgeon: Lynann Bologna, DO;  Location: [facility];  Service: Gastroenterology;  Laterality: N/A;   ESOPHAGOGASTRODUODENOSCOPY (EGD) WITH PROPOFOL N/A 09/24/2022   Procedure: ESOPHAGOGASTRODUODENOSCOPY (EGD) WITH PROPOFOL;  Surgeon: Lynann Bologna, DO;  Location: [facility];  Service: Gastroenterology;  Laterality: N/A;   POLYPECTOMY  09/24/2022   Procedure: POLYPECTOMY;  Surgeon: Lynann Bologna, DO;  Location: [facility];  Service: Gastroenterology;;   TUBAL LIGATION      FAMILY HISTORY: Family History  Problem Relation Age of Onset   Diabetes type II Father    Asthma Father    Hypertension Father    Heart murmur Father    Alcohol abuse Father    Drug abuse Father    Hypertension Mother    Alcohol abuse Mother    Drug abuse Mother     SOCIAL HISTORY: Social History    Socioeconomic History   Marital status: Single    Spouse name: Not on file   Number of children: 2   Years of education: Not on file   Highest education level: Not on file  Occupational History   Occupation: homemaker  Tobacco Use   Smoking status: Former    Types: Cigars    Quit date: 06/30/2017    Years since quitting: 5.7   Smokeless tobacco: Never   Tobacco comments:    Smokes Marijuana ("here and there") (as needed)  Vaping Use   Vaping status: Never Used  Substance and Sexual Activity   Alcohol use: Yes    Comment: occasionally   Drug use: Not Currently    Types: Marijuana   Sexual activity: Not on file  Other Topics Concern   Not on file  Social History Narrative   Not on file   Social Determinants of Health   Financial Resource Strain: Not on file  Food Insecurity: Not on file  Transportation Needs: Not on file  Physical Activity: Not on file  Stress: Not on file  Social Connections: Unknown (12/07/2021)   Received [facility]   Social Network    Social Network: Not on file  Intimate Partner Violence: Unknown (10/30/2021)   Received from [facility]   HITS    Physically Hurt: Not on file    Insult or Talk Down To: Not on file    Threaten Physical Harm: Not on file    Scream or Curse: Not on file      Levert Feinstein, M.D. Ph.D.  [facility] [address] [place], [place] [address] Ph: [phone] Fax: [phone]  CC:  Pilar Plate, DPM [address] [place],  [place] [address]  Sharmon Revere, MD

## 2023-03-25 NOTE — Patient Instructions (Signed)
  Meds ordered this encounter  Medications   DULoxetine (CYMBALTA) 30 MG capsule    Sig: Take 1 capsule (30 mg total) by mouth daily.    Dispense:  30 capsule    Refill:  0   DULoxetine (CYMBALTA) 60 MG capsule    Sig: Take 1 capsule (60 mg total) by mouth daily.    Dispense:  30 capsule    Refill:  11   nortriptyline (PAMELOR) 25 MG capsule    Sig: Take 2 capsules (50 mg total) by mouth at bedtime.    Dispense:  60 capsule    Refill:  11   lidocaine-prilocaine (EMLA) cream    Sig: 1 gram Qid prn    Dispense:  30 g    Refill:  11

## 2023-03-27 ENCOUNTER — Ambulatory Visit: Payer: Medicare HMO | Admitting: Podiatry

## 2023-03-31 LAB — MULTIPLE MYELOMA PANEL, SERUM
Albumin SerPl Elph-Mcnc: 3.7 g/dL (ref 2.9–4.4)
Albumin/Glob SerPl: 1 (ref 0.7–1.7)
Alpha 1: 0.2 g/dL (ref 0.0–0.4)
Alpha2 Glob SerPl Elph-Mcnc: 1 g/dL (ref 0.4–1.0)
B-Globulin SerPl Elph-Mcnc: 1.2 g/dL (ref 0.7–1.3)
Gamma Glob SerPl Elph-Mcnc: 1.4 g/dL (ref 0.4–1.8)
Globulin, Total: 3.8 g/dL (ref 2.2–3.9)
IgA/Immunoglobulin A, Serum: 211 mg/dL (ref 87–352)
IgG (Immunoglobin G), Serum: 1332 mg/dL (ref 586–1602)
IgM (Immunoglobulin M), Srm: 323 mg/dL — ABNORMAL HIGH (ref 26–217)
Total Protein: 7.5 g/dL (ref 6.0–8.5)

## 2023-03-31 LAB — TSH: TSH: 1.26 u[IU]/mL (ref 0.450–4.500)

## 2023-03-31 LAB — ANA W/REFLEX IF POSITIVE: Anti Nuclear Antibody (ANA): NEGATIVE

## 2023-03-31 LAB — RPR: RPR Ser Ql: NONREACTIVE

## 2023-03-31 LAB — CK: Total CK: 165 U/L (ref 32–182)

## 2023-03-31 LAB — HGB A1C W/O EAG: Hgb A1c MFr Bld: 7.4 % — ABNORMAL HIGH (ref 4.8–5.6)

## 2023-03-31 LAB — SEDIMENTATION RATE: Sed Rate: 45 mm/h — ABNORMAL HIGH (ref 0–40)

## 2023-03-31 LAB — VITAMIN B12: Vitamin B-12: 310 pg/mL (ref 232–1245)

## 2023-03-31 LAB — HIV ANTIBODY (ROUTINE TESTING W REFLEX): HIV Screen 4th Generation wRfx: NONREACTIVE

## 2023-06-18 ENCOUNTER — Other Ambulatory Visit: Payer: Self-pay | Admitting: Family Medicine

## 2023-06-18 ENCOUNTER — Ambulatory Visit
Admission: RE | Admit: 2023-06-18 | Discharge: 2023-06-18 | Disposition: A | Discharge status: Home / Self Care | Payer: Medicare HMO | Source: Ambulatory Visit | Attending: Family Medicine

## 2023-06-18 DIAGNOSIS — M545 Low back pain, unspecified: Secondary | ICD-10-CM

## 2023-07-15 NOTE — Progress Notes (Signed)
HPI: FU chronic diastolic congestive heart failure, coronary calcification, hypertension and dyspnea. High resolution chest CT June 2021 showed findings consistent with sarcoid, coronary artery calcification and aortic atherosclerosis.  CPX August 2021 showed severe functional limitation primarily pulmonary with severe hypertensive response.  Patient body habitus felt to be contributing significantly to dyspnea. Echocardiogram September 2022 showed normal LV function, severe left ventricular hypertrophy, grade 1 diastolic dysfunction.  ABIs June 2024 normal resting.  Since last seen, the patient denies any dyspnea on exertion, orthopnea, PND, pedal edema, palpitations, syncope or chest pain.   Current Outpatient Medications  Medication Sig Dispense Refill   albuterol (PROVENTIL) (2.5 MG/3ML) 0.083% nebulizer solution Take 3 mLs (2.5 mg total) by nebulization every 6 (six) hours as needed for wheezing or shortness of breath. 75 mL 12   albuterol (VENTOLIN HFA) 108 (90 Base) MCG/ACT inhaler Inhale 1-2 puffs into the lungs every 6 (six) hours as needed for wheezing or shortness of breath. 18 g 6   allopurinol (ZYLOPRIM) 100 MG tablet Take 100 mg by mouth in the morning.     amLODipine (NORVASC) 10 MG tablet Take 1 tablet (10 mg total) by mouth daily after lunch. (Patient taking differently: Take 10 mg by mouth in the morning.) 30 tablet 3   ciclopirox (PENLAC) 8 % solution Apply topically at bedtime. Apply over nail and surrounding skin. Apply daily over previous coat. After seven (7) days, may remove with alcohol and continue cycle. 6.6 mL 0   cyclobenzaprine (FLEXERIL) 5 MG tablet Take 1 tablet (5 mg total) by mouth 2 (two) times daily as needed for muscle spasms. 15 tablet 0   DULoxetine (CYMBALTA) 30 MG capsule Take 1 capsule (30 mg total) by mouth daily. 30 capsule 0   DULoxetine (CYMBALTA) 60 MG capsule Take 1 capsule (60 mg total) by mouth daily. 30 capsule 11   Fluticasone-Umeclidin-Vilant  (TRELEGY ELLIPTA) 200-62.5-25 MCG/ACT AEPB Inhale 1 puff into the lungs daily.     furosemide (LASIX) 20 MG tablet Take 1 tablet (20 mg total) by mouth daily as needed for edema. 30 tablet 6   gabapentin (NEURONTIN) 800 MG tablet Take 800 mg by mouth 3 (three) times daily.     labetalol (NORMODYNE) 100 MG tablet Take 100 mg by mouth in the morning.     lidocaine-prilocaine (EMLA) cream 1 gram Qid prn 30 g 11   losartan-hydrochlorothiazide (HYZAAR) 100-25 MG tablet Take 1 tablet by mouth in the morning.     MOUNJARO 10 MG/0.5ML Pen Inject 10 mg into the skin every Tuesday.     MOUNJARO 15 MG/0.5ML Pen 15 mg.     nortriptyline (PAMELOR) 25 MG capsule Take 2 capsules (50 mg total) by mouth at bedtime. 60 capsule 11   OXYGEN Inhale 2-3 L into the lungs continuous.     rosuvastatin (CRESTOR) 40 MG tablet Take 1 tablet by mouth once daily 90 tablet 2   SYNJARDY XR 25-1000 MG TB24 Take 1 tablet by mouth in the morning.     TOUJEO SOLOSTAR 300 UNIT/ML Solostar Pen Inject 40 Units into the skin 2 (two) times daily after a meal. (Patient taking differently: Inject 65 Units into the skin 2 (two) times daily after a meal.)     No current facility-administered medications for this visit.     Past Medical History:  Diagnosis Date   Anxiety    Asthma    Bone pain 06/07/2013   Depression    Diabetes mellitus 12/18/2012  NIDDM   Hernia of abdominal cavity 10/25/2013   History of anemia 07/26/2013   Hypertension    Iron deficiency anemia due to chronic blood loss    Lymphadenopathy 06/07/2013   Obesity 12/18/2012   Psoriasis    Sarcoidosis 07/26/2013   Sleep apnea    uses bipap    Past Surgical History:  Procedure Laterality Date   BIOPSY  09/24/2022   Procedure: BIOPSY;  Surgeon: Lynann Bologna, DO;  Location: [facility];  Service: Gastroenterology;;  esophagogastroduodenoscopy   COLONOSCOPY WITH PROPOFOL N/A 09/24/2022   Procedure: COLONOSCOPY WITH PROPOFOL;  Surgeon: Lynann Bologna,  DO;  Location: [facility];  Service: Gastroenterology;  Laterality: N/A;   ESOPHAGOGASTRODUODENOSCOPY (EGD) WITH PROPOFOL N/A 09/24/2022   Procedure: ESOPHAGOGASTRODUODENOSCOPY (EGD) WITH PROPOFOL;  Surgeon: Lynann Bologna, DO;  Location: [facility];  Service: Gastroenterology;  Laterality: N/A;   POLYPECTOMY  09/24/2022   Procedure: POLYPECTOMY;  Surgeon: Lynann Bologna, DO;  Location: [facility];  Service: Gastroenterology;;   TUBAL LIGATION      Social History   Socioeconomic History   Marital status: Single    Spouse name: Not on file   Number of children: 2   Years of education: Not on file   Highest education level: Not on file  Occupational History   Occupation: homemaker  Tobacco Use   Smoking status: Former    Types: Cigars    Quit date: 06/30/2017    Years since quitting: 6.0   Smokeless tobacco: Never   Tobacco comments:    Smokes Marijuana ("here and there") (as needed)  Vaping Use   Vaping status: Never Used  Substance and Sexual Activity   Alcohol use: Yes    Comment: occasionally   Drug use: Not Currently    Types: Marijuana   Sexual activity: Not on file  Other Topics Concern   Not on file  Social History Narrative   Not on file   Social Drivers of Health   Financial Resource Strain: Not on file  Food Insecurity: Not on file  Transportation Needs: Not on file  Physical Activity: Not on file  Stress: Not on file  Social Connections: Unknown (12/07/2021)   Received from [facility]   Social Network    Social Network: Not on file  Intimate Partner Violence: Unknown (10/30/2021)   Received from [employer], [facility]   HITS    Physically Hurt: Not on file    Insult or Talk Down To: Not on file    Threaten Physical Harm: Not on file    Scream or Curse: Not on file    Family History  Problem Relation Age of Onset   Diabetes type II Father    Asthma Father    Hypertension Father    Heart murmur Father    Alcohol  abuse Father    Drug abuse Father    Hypertension Mother    Alcohol abuse Mother    Drug abuse Mother     ROS: no fevers or chills, productive cough, hemoptysis, dysphasia, odynophagia, melena, hematochezia, dysuria, hematuria, rash, seizure activity, orthopnea, PND, pedal edema, claudication. Remaining systems are negative.  Physical Exam: Well-developed well-nourished in no acute distress.  Skin is warm and dry.  HEENT is normal.  Neck is supple.  Chest is clear to auscultation with normal expansion.  Cardiovascular exam is regular rate and rhythm.  Abdominal exam nontender or distended. No masses palpated. Extremities show no edema. neuro grossly intact  A/P  1 chronic diastolic congestive heart failure-she appears to be euvolemic today.  Will continue diuretic as needed.  2 coronary calcification-continue statin.  Patient denies chest pain.  3 hypertension-patient's blood pressure is controlled.  Continue present medications and follow.  Check potassium and renal function.  4 obstructive sleep apnea-continue BiPAP.  5 hyperlipidemia-continue statin.  Check lipids and liver.  6 morbid obesity-we discussed the importance of weight loss.  She is on [facility] and has lost 7 pounds recently.  I encouraged her to continue efforts.  7 sarcoid-managed by pulmonary.  8 chronic respiratory failure-felt to be multifactorial including obesity hypoventilation syndrome, asthma, obstructive sleep apnea and pulmonary venous hypertension.  Olga Millers, MD

## 2023-07-21 ENCOUNTER — Ambulatory Visit: Payer: Medicare HMO | Attending: Cardiology | Admitting: Cardiology

## 2023-07-21 ENCOUNTER — Encounter: Payer: Self-pay | Admitting: Cardiology

## 2023-07-21 VITALS — BP 130/70 | HR 88 | Ht 59.0 in | Wt 217.0 lb

## 2023-07-21 DIAGNOSIS — I10 Essential (primary) hypertension: Secondary | ICD-10-CM | POA: Diagnosis not present

## 2023-07-21 DIAGNOSIS — E785 Hyperlipidemia, unspecified: Secondary | ICD-10-CM | POA: Diagnosis not present

## 2023-07-21 DIAGNOSIS — I5032 Chronic diastolic (congestive) heart failure: Secondary | ICD-10-CM | POA: Diagnosis not present

## 2023-07-21 DIAGNOSIS — I2510 Atherosclerotic heart disease of native coronary artery without angina pectoris: Secondary | ICD-10-CM

## 2023-07-21 LAB — COMPREHENSIVE METABOLIC PANEL
ALT: 16 [IU]/L (ref 0–32)
AST: 18 [IU]/L (ref 0–40)
Albumin: 4.6 g/dL (ref 3.8–4.9)
Alkaline Phosphatase: 114 [IU]/L (ref 44–121)
BUN/Creatinine Ratio: 17 (ref 9–23)
BUN: 22 mg/dL (ref 6–24)
Bilirubin Total: 0.3 mg/dL (ref 0.0–1.2)
CO2: 22 mmol/L (ref 20–29)
Calcium: 10.1 mg/dL (ref 8.7–10.2)
Chloride: 106 mmol/L (ref 96–106)
Creatinine, Ser: 1.32 mg/dL — ABNORMAL HIGH (ref 0.57–1.00)
Globulin, Total: 2.7 g/dL (ref 1.5–4.5)
Glucose: 92 mg/dL (ref 70–99)
Potassium: 4.9 mmol/L (ref 3.5–5.2)
Sodium: 145 mmol/L — ABNORMAL HIGH (ref 134–144)
Total Protein: 7.3 g/dL (ref 6.0–8.5)
eGFR: 47 mL/min/{1.73_m2} — ABNORMAL LOW (ref >59)

## 2023-07-21 LAB — LIPID PANEL
Chol/HDL Ratio: 4.5 {ratio} — ABNORMAL HIGH (ref 0.0–4.4)
Cholesterol, Total: 178 mg/dL (ref 100–199)
HDL: 40 mg/dL (ref >39)
LDL Chol Calc (NIH): 107 mg/dL — ABNORMAL HIGH (ref 0–99)
Triglycerides: 179 mg/dL — ABNORMAL HIGH (ref 0–149)
VLDL Cholesterol Cal: 31 mg/dL (ref 5–40)

## 2023-07-21 NOTE — Patient Instructions (Signed)
   Follow-Up: At [facility], you and your health needs are our priority.  As part of our continuing mission to provide you with exceptional heart care, we have created designated Provider Care Teams.  These Care Teams include your primary Cardiologist (physician) and Advanced Practice Providers (APPs -  Physician Assistants and Nurse Practitioners) who all work together to provide you with the care you need, when you need it.    Your next appointment:   12 month(s)  Provider:   Olga Millers MD

## 2023-07-24 ENCOUNTER — Telehealth: Payer: Self-pay | Admitting: *Deleted

## 2023-07-24 DIAGNOSIS — E785 Hyperlipidemia, unspecified: Secondary | ICD-10-CM

## 2023-07-24 MED ORDER — EZETIMIBE 10 MG PO TABS
10.0000 mg | ORAL_TABLET | Freq: Every day | ORAL | 3 refills | Status: DC
Start: 2023-07-24 — End: 2023-11-09

## 2023-07-24 NOTE — Telephone Encounter (Signed)
-----   Message from Olga Millers sent at 07/22/2023  7:25 AM EST ----- Add zetia 10 mg daily; lipids and liver 8 weeks Olga Millers

## 2023-07-24 NOTE — Telephone Encounter (Signed)
Pt has reviewed results via my chart  New script sent to the pharmacy  Lab orders mailed to the pt  

## 2023-09-30 ENCOUNTER — Telehealth: Payer: Self-pay | Admitting: Neurology

## 2023-09-30 NOTE — Telephone Encounter (Signed)
 Pt rescheduled appt due to Conflict/Need to Reschedule Scheduling conflict, may have to leave town for funeral

## 2023-10-07 ENCOUNTER — Ambulatory Visit: Payer: Medicare HMO | Admitting: Adult Health

## 2023-10-09 ENCOUNTER — Encounter: Payer: Self-pay | Admitting: *Deleted

## 2023-11-04 ENCOUNTER — Ambulatory Visit: Admitting: Adult Health

## 2023-11-06 ENCOUNTER — Emergency Department (HOSPITAL_COMMUNITY)

## 2023-11-06 ENCOUNTER — Encounter (HOSPITAL_COMMUNITY): Payer: Self-pay | Admitting: Internal Medicine

## 2023-11-06 ENCOUNTER — Other Ambulatory Visit: Payer: Self-pay

## 2023-11-06 ENCOUNTER — Observation Stay (HOSPITAL_COMMUNITY)
Admission: EM | Admit: 2023-11-06 | Discharge: 2023-11-09 | Disposition: A | Discharge status: Home / Self Care | Attending: Internal Medicine | Admitting: Internal Medicine

## 2023-11-06 DIAGNOSIS — F1290 Cannabis use, unspecified, uncomplicated: Secondary | ICD-10-CM | POA: Diagnosis present

## 2023-11-06 DIAGNOSIS — E785 Hyperlipidemia, unspecified: Secondary | ICD-10-CM | POA: Diagnosis not present

## 2023-11-06 DIAGNOSIS — Z6841 Body Mass Index (BMI) 40.0 and over, adult: Secondary | ICD-10-CM | POA: Insufficient documentation

## 2023-11-06 DIAGNOSIS — R7989 Other specified abnormal findings of blood chemistry: Secondary | ICD-10-CM | POA: Diagnosis not present

## 2023-11-06 DIAGNOSIS — Z1152 Encounter for screening for COVID-19: Secondary | ICD-10-CM | POA: Diagnosis not present

## 2023-11-06 DIAGNOSIS — F1090 Alcohol use, unspecified, uncomplicated: Secondary | ICD-10-CM | POA: Diagnosis not present

## 2023-11-06 DIAGNOSIS — Z794 Long term (current) use of insulin: Secondary | ICD-10-CM | POA: Insufficient documentation

## 2023-11-06 DIAGNOSIS — Z79899 Other long term (current) drug therapy: Secondary | ICD-10-CM | POA: Diagnosis not present

## 2023-11-06 DIAGNOSIS — G4733 Obstructive sleep apnea (adult) (pediatric): Secondary | ICD-10-CM | POA: Diagnosis present

## 2023-11-06 DIAGNOSIS — D509 Iron deficiency anemia, unspecified: Secondary | ICD-10-CM | POA: Diagnosis not present

## 2023-11-06 DIAGNOSIS — Z87891 Personal history of nicotine dependence: Secondary | ICD-10-CM | POA: Insufficient documentation

## 2023-11-06 DIAGNOSIS — E119 Type 2 diabetes mellitus without complications: Secondary | ICD-10-CM

## 2023-11-06 DIAGNOSIS — E1122 Type 2 diabetes mellitus with diabetic chronic kidney disease: Secondary | ICD-10-CM | POA: Insufficient documentation

## 2023-11-06 DIAGNOSIS — R0600 Dyspnea, unspecified: Principal | ICD-10-CM

## 2023-11-06 DIAGNOSIS — Z8659 Personal history of other mental and behavioral disorders: Secondary | ICD-10-CM | POA: Diagnosis not present

## 2023-11-06 DIAGNOSIS — I129 Hypertensive chronic kidney disease with stage 1 through stage 4 chronic kidney disease, or unspecified chronic kidney disease: Secondary | ICD-10-CM | POA: Diagnosis not present

## 2023-11-06 DIAGNOSIS — F419 Anxiety disorder, unspecified: Secondary | ICD-10-CM | POA: Insufficient documentation

## 2023-11-06 DIAGNOSIS — M109 Gout, unspecified: Secondary | ICD-10-CM | POA: Diagnosis not present

## 2023-11-06 DIAGNOSIS — I10 Essential (primary) hypertension: Secondary | ICD-10-CM | POA: Diagnosis present

## 2023-11-06 DIAGNOSIS — R0609 Other forms of dyspnea: Secondary | ICD-10-CM | POA: Diagnosis present

## 2023-11-06 DIAGNOSIS — E662 Morbid (severe) obesity with alveolar hypoventilation: Secondary | ICD-10-CM | POA: Diagnosis present

## 2023-11-06 DIAGNOSIS — N1831 Chronic kidney disease, stage 3a: Secondary | ICD-10-CM | POA: Insufficient documentation

## 2023-11-06 DIAGNOSIS — J441 Chronic obstructive pulmonary disease with (acute) exacerbation: Secondary | ICD-10-CM | POA: Diagnosis not present

## 2023-11-06 DIAGNOSIS — R059 Cough, unspecified: Secondary | ICD-10-CM | POA: Diagnosis present

## 2023-11-06 LAB — CBC
HCT: 48.4 % — ABNORMAL HIGH (ref 36.0–46.0)
Hemoglobin: 14.1 g/dL (ref 12.0–15.0)
MCH: 21.3 pg — ABNORMAL LOW (ref 26.0–34.0)
MCHC: 29.1 g/dL — ABNORMAL LOW (ref 30.0–36.0)
MCV: 73.2 fL — ABNORMAL LOW (ref 80.0–100.0)
Platelets: 302 10*3/uL (ref 150–400)
RBC: 6.61 MIL/uL — ABNORMAL HIGH (ref 3.87–5.11)
RDW: 22.1 % — ABNORMAL HIGH (ref 11.5–15.5)
WBC: 7.9 10*3/uL (ref 4.0–10.5)
nRBC: 0 % (ref 0.0–0.2)

## 2023-11-06 LAB — COMPREHENSIVE METABOLIC PANEL WITH GFR
ALT: 20 U/L (ref 0–44)
AST: 24 U/L (ref 15–41)
Albumin: 4.3 g/dL (ref 3.5–5.0)
Alkaline Phosphatase: 117 U/L (ref 38–126)
Anion gap: 10 (ref 5–15)
BUN: 20 mg/dL (ref 6–20)
CO2: 23 mmol/L (ref 22–32)
Calcium: 10 mg/dL (ref 8.9–10.3)
Chloride: 105 mmol/L (ref 98–111)
Creatinine, Ser: 0.9 mg/dL (ref 0.44–1.00)
GFR, Estimated: >60 mL/min (ref >60)
Glucose, Bld: 119 mg/dL — ABNORMAL HIGH (ref 70–99)
Potassium: 4.3 mmol/L (ref 3.5–5.1)
Sodium: 138 mmol/L (ref 135–145)
Total Bilirubin: 0.6 mg/dL (ref 0.0–1.2)
Total Protein: 8.9 g/dL — ABNORMAL HIGH (ref 6.5–8.1)

## 2023-11-06 LAB — BRAIN NATRIURETIC PEPTIDE: B Natriuretic Peptide: 56.8 pg/mL (ref 0.0–100.0)

## 2023-11-06 LAB — RESP PANEL BY RT-PCR (RSV, FLU A&B, COVID)  RVPGX2
Influenza A by PCR: NEGATIVE
Influenza B by PCR: NEGATIVE
Resp Syncytial Virus by PCR: NEGATIVE
SARS Coronavirus 2 by RT PCR: NEGATIVE

## 2023-11-06 LAB — CBG MONITORING, ED: Glucose-Capillary: 150 mg/dL — ABNORMAL HIGH (ref 70–99)

## 2023-11-06 LAB — TROPONIN I (HIGH SENSITIVITY)
Troponin I (High Sensitivity): 21 ng/L — ABNORMAL HIGH (ref <18)
Troponin I (High Sensitivity): 20 ng/L — ABNORMAL HIGH (ref <18)

## 2023-11-06 LAB — GLUCOSE, CAPILLARY
Glucose-Capillary: 162 mg/dL — ABNORMAL HIGH (ref 70–99)
Glucose-Capillary: 167 mg/dL — ABNORMAL HIGH (ref 70–99)
Glucose-Capillary: 181 mg/dL — ABNORMAL HIGH (ref 70–99)

## 2023-11-06 LAB — D-DIMER, QUANTITATIVE: D-Dimer, Quant: <0.27 ug{FEU}/mL (ref 0.00–0.50)

## 2023-11-06 MED ORDER — INSULIN ASPART 100 UNIT/ML IJ SOLN
0.0000 [IU] | Freq: Every day | INTRAMUSCULAR | Status: DC
  Filled 2023-11-06: qty 0.05

## 2023-11-06 MED ORDER — IPRATROPIUM BROMIDE 0.02 % IN SOLN
0.5000 mg | Freq: Once | RESPIRATORY_TRACT | Status: AC
  Administered 2023-11-06: 0.5 mg via RESPIRATORY_TRACT
  Filled 2023-11-06 (×2): qty 2.5

## 2023-11-06 MED ORDER — ALBUTEROL SULFATE (2.5 MG/3ML) 0.083% IN NEBU: 2.5000 mg | INHALATION_SOLUTION | RESPIRATORY_TRACT | Status: DC | PRN

## 2023-11-06 MED ORDER — ACETAMINOPHEN 325 MG PO TABS: 650.0000 mg | ORAL_TABLET | Freq: Four times a day (QID) | ORAL | Status: DC | PRN

## 2023-11-06 MED ORDER — LOSARTAN POTASSIUM-HCTZ 100-25 MG PO TABS: 1.0000 | ORAL_TABLET | Freq: Every morning | ORAL | Status: DC

## 2023-11-06 MED ORDER — FERROUS SULFATE 325 (65 FE) MG PO TABS
325.0000 mg | ORAL_TABLET | Freq: Every day | ORAL | Status: DC
  Administered 2023-11-06 – 2023-11-09 (×4): 325 mg via ORAL
  Filled 2023-11-06 (×4): qty 1

## 2023-11-06 MED ORDER — INSULIN ASPART 100 UNIT/ML IJ SOLN
0.0000 [IU] | Freq: Three times a day (TID) | INTRAMUSCULAR | Status: DC
Start: 2023-11-06 — End: 2023-11-09
  Administered 2023-11-06: 2 [IU] via SUBCUTANEOUS
  Administered 2023-11-06 – 2023-11-08 (×6): 3 [IU] via SUBCUTANEOUS
  Administered 2023-11-08: 5 [IU] via SUBCUTANEOUS
  Administered 2023-11-09: 2 [IU] via SUBCUTANEOUS
  Filled 2023-11-06: qty 0.15

## 2023-11-06 MED ORDER — EZETIMIBE 10 MG PO TABS
10.0000 mg | ORAL_TABLET | Freq: Every day | ORAL | Status: DC
  Administered 2023-11-07 – 2023-11-09 (×3): 10 mg via ORAL
  Filled 2023-11-06 (×3): qty 1

## 2023-11-06 MED ORDER — ROSUVASTATIN CALCIUM 10 MG PO TABS
10.0000 mg | ORAL_TABLET | Freq: Every day | ORAL | Status: DC
  Administered 2023-11-07 – 2023-11-09 (×3): 10 mg via ORAL
  Filled 2023-11-06 (×3): qty 1

## 2023-11-06 MED ORDER — HYDROCHLOROTHIAZIDE 25 MG PO TABS
25.0000 mg | ORAL_TABLET | Freq: Every day | ORAL | Status: DC
  Administered 2023-11-06: 25 mg via ORAL
  Filled 2023-11-06: qty 1

## 2023-11-06 MED ORDER — LOSARTAN POTASSIUM 50 MG PO TABS
100.0000 mg | ORAL_TABLET | Freq: Every day | ORAL | Status: DC
  Administered 2023-11-06: 100 mg via ORAL
  Filled 2023-11-06: qty 2

## 2023-11-06 MED ORDER — ACETAMINOPHEN 650 MG RE SUPP: 650.0000 mg | Freq: Four times a day (QID) | RECTAL | Status: DC | PRN

## 2023-11-06 MED ORDER — AMLODIPINE BESYLATE 10 MG PO TABS
10.0000 mg | ORAL_TABLET | Freq: Every morning | ORAL | Status: DC
Start: 2023-11-07 — End: 2023-11-09
  Administered 2023-11-06 – 2023-11-09 (×4): 10 mg via ORAL
  Filled 2023-11-06: qty 2
  Filled 2023-11-06 (×3): qty 1

## 2023-11-06 MED ORDER — LABETALOL HCL 100 MG PO TABS
100.0000 mg | ORAL_TABLET | Freq: Every morning | ORAL | Status: DC
  Administered 2023-11-06 – 2023-11-09 (×4): 100 mg via ORAL
  Filled 2023-11-06 (×4): qty 1

## 2023-11-06 MED ORDER — IPRATROPIUM-ALBUTEROL 0.5-2.5 (3) MG/3ML IN SOLN
3.0000 mL | Freq: Four times a day (QID) | RESPIRATORY_TRACT | Status: DC
  Administered 2023-11-06 – 2023-11-07 (×6): 3 mL via RESPIRATORY_TRACT
  Filled 2023-11-06 (×7): qty 3

## 2023-11-06 MED ORDER — GABAPENTIN 300 MG PO CAPS
600.0000 mg | ORAL_CAPSULE | Freq: Three times a day (TID) | ORAL | Status: DC
  Administered 2023-11-06 – 2023-11-09 (×8): 600 mg via ORAL
  Filled 2023-11-06 (×2): qty 6
  Filled 2023-11-06 (×6): qty 2

## 2023-11-06 MED ORDER — METHYLPREDNISOLONE SODIUM SUCC 125 MG IJ SOLR
125.0000 mg | Freq: Once | INTRAMUSCULAR | Status: AC
  Administered 2023-11-06: 125 mg via INTRAVENOUS
  Filled 2023-11-06: qty 2

## 2023-11-06 MED ORDER — ONDANSETRON HCL 4 MG/2ML IJ SOLN: 4.0000 mg | Freq: Four times a day (QID) | INTRAMUSCULAR | Status: DC | PRN

## 2023-11-06 MED ORDER — TRAZODONE HCL 50 MG PO TABS: 25.0000 mg | ORAL_TABLET | Freq: Every evening | ORAL | Status: DC | PRN

## 2023-11-06 MED ORDER — ENOXAPARIN SODIUM 40 MG/0.4ML IJ SOSY
40.0000 mg | PREFILLED_SYRINGE | Freq: Every day | INTRAMUSCULAR | Status: DC
  Administered 2023-11-06 – 2023-11-09 (×4): 40 mg via SUBCUTANEOUS
  Filled 2023-11-06 (×4): qty 0.4

## 2023-11-06 MED ORDER — ALBUTEROL SULFATE (2.5 MG/3ML) 0.083% IN NEBU
10.0000 mg/h | INHALATION_SOLUTION | Freq: Once | RESPIRATORY_TRACT | Status: AC
  Administered 2023-11-06: 10 mg/h via RESPIRATORY_TRACT
  Filled 2023-11-06: qty 3
  Filled 2023-11-06: qty 12

## 2023-11-06 MED ORDER — ONDANSETRON HCL 4 MG PO TABS: 4.0000 mg | ORAL_TABLET | Freq: Four times a day (QID) | ORAL | Status: DC | PRN

## 2023-11-06 MED ORDER — METHYLPREDNISOLONE SODIUM SUCC 40 MG IJ SOLR
40.0000 mg | Freq: Two times a day (BID) | INTRAMUSCULAR | Status: DC
  Administered 2023-11-07 – 2023-11-08 (×3): 40 mg via INTRAVENOUS
  Filled 2023-11-06 (×3): qty 1

## 2023-11-06 NOTE — Plan of Care (Signed)
  Problem: Coping: Goal: Ability to adjust to condition or change in health will improve Outcome: Progressing   Problem: Fluid Volume: Goal: Ability to maintain a balanced intake and output will improve Outcome: Progressing   Problem: Nutritional: Goal: Maintenance of adequate nutrition will improve Outcome: Progressing   Problem: Skin Integrity: Goal: Risk for impaired skin integrity will decrease Outcome: Progressing   Problem: Nutrition: Goal: Adequate nutrition will be maintained Outcome: Progressing   Problem: Coping: Goal: Level of anxiety will decrease Outcome: Progressing   Problem: Elimination: Goal: Will not experience complications related to urinary retention Outcome: Progressing   Problem: Pain Managment: Goal: General experience of comfort will improve and/or be controlled Outcome: Progressing   Problem: Safety: Goal: Ability to remain free from injury will improve Outcome: Progressing   Problem: Skin Integrity: Goal: Risk for impaired skin integrity will decrease Outcome: Progressing

## 2023-11-06 NOTE — ED Provider Notes (Signed)
 [place] EMERGENCY DEPARTMENT AT [facility] Provider Note   CSN: [id number] Arrival date & time: 11/06/23  0754     History  Chief Complaint  Patient presents with   Cough    [name] is a 58 y.o. female.  58 year old female with history of CHF and COPD presents with 1 week of increased shortness of breath.  She notes increased dyspnea exertion as well as orthopnea.  History of CHF in the past as mentioned.  Patient takes Lasix as needed.  Has not taking any Lasix recently.  States she has chest discomfort only when she coughs.  Denies any fever or chills.  No lower extremity edema.  Wears oxygen at baseline at 2 L       Home Medications Prior to Admission medications   Medication Sig Start Date End Date Taking? Authorizing Provider  albuterol (PROVENTIL) (2.5 MG/3ML) 0.083% nebulizer solution Take 3 mLs (2.5 mg total) by nebulization every 6 (six) hours as needed for wheezing or shortness of breath. 05/19/17   Bevelyn Ngo, NP  albuterol (VENTOLIN HFA) 108 (90 Base) MCG/ACT inhaler Inhale 1-2 puffs into the lungs every 6 (six) hours as needed for wheezing or shortness of breath. 11/09/19   Glenford Bayley, NP  allopurinol (ZYLOPRIM) 100 MG tablet Take 100 mg by mouth in the morning.    [provider]  amLODipine (NORVASC) 10 MG tablet Take 1 tablet (10 mg total) by mouth daily after lunch. Patient taking differently: Take 10 mg by mouth in the morning. 09/30/21   Meredeth Ide, MD  ciclopirox (PENLAC) 8 % solution Apply topically at bedtime. Apply over nail and surrounding skin. Apply daily over previous coat. After seven (7) days, may remove with alcohol and continue cycle. 12/13/22   Standiford, Jenelle Mages, DPM  cyclobenzaprine (FLEXERIL) 5 MG tablet Take 1 tablet (5 mg total) by mouth 2 (two) times daily as needed for muscle spasms. 07/14/22   Wynetta Fines, MD  DULoxetine (CYMBALTA) 30 MG capsule Take 1 capsule (30 mg total) by mouth  daily. 03/25/23   Levert Feinstein, MD  DULoxetine (CYMBALTA) 60 MG capsule Take 1 capsule (60 mg total) by mouth daily. 03/25/23   Levert Feinstein, MD  ezetimibe (ZETIA) 10 MG tablet Take 1 tablet (10 mg total) by mouth daily. 07/24/23   Lewayne Bunting, MD  Fluticasone-Umeclidin-Vilant (TRELEGY ELLIPTA) 200-62.5-25 MCG/ACT AEPB Inhale 1 puff into the lungs daily.    [provider]  furosemide (LASIX) 20 MG tablet Take 1 tablet (20 mg total) by mouth daily as needed for edema. 01/07/23   Carlos Levering, NP  gabapentin (NEURONTIN) 800 MG tablet Take 800 mg by mouth 3 (three) times daily. 09/14/20   [provider]  labetalol (NORMODYNE) 100 MG tablet Take 100 mg by mouth in the morning. 10/27/19   [provider]  lidocaine-prilocaine (EMLA) cream 1 gram Qid prn 03/25/23   Levert Feinstein, MD  losartan-hydrochlorothiazide (HYZAAR) 100-25 MG tablet Take 1 tablet by mouth in the morning.    [provider]  MOUNJARO 10 MG/0.5ML Pen Inject 10 mg into the skin every Tuesday. 03/21/22   [provider]  MOUNJARO 15 MG/0.5ML Pen 15 mg. 05/13/23   [provider]  nortriptyline (PAMELOR) 25 MG capsule Take 2 capsules (50 mg total) by mouth at bedtime. 03/25/23   Levert Feinstein, MD  OXYGEN Inhale 2-3 L into the lungs continuous.    [provider]  rosuvastatin (CRESTOR) 40  MG tablet Take 1 tablet by mouth once daily 07/30/22   Lewayne Bunting, MD  SYNJARDY XR 25-1000 MG TB24 Take 1 tablet by mouth in the morning. 09/25/21   [provider]  TOUJEO SOLOSTAR 300 UNIT/ML Solostar Pen Inject 40 Units into the skin 2 (two) times daily after a meal. Patient taking differently: Inject 65 Units into the skin 2 (two) times daily after a meal. 04/27/21   Narda Bonds, MD      Allergies    Oxycontin [oxycodone]    Review of Systems   Review of Systems  All other systems reviewed and are negative.   Physical Exam Updated Vital Signs BP (!) 176/103 (BP  Location: Right Arm)   Pulse (!) 102   Temp 98 F (36.7 C) (Oral)   Resp 15   Ht 1.499 m (4\' 11" )   Wt 92.1 kg   LMP 06/13/2012   SpO2 93%   BMI 41.00 kg/m  Physical Exam Vitals and nursing note reviewed.  Constitutional:      General: She is not in acute distress.    Appearance: Normal appearance. She is well-developed. She is not toxic-appearing.  HENT:     Head: Normocephalic and atraumatic.  Eyes:     General: Lids are normal.     Conjunctiva/sclera: Conjunctivae normal.     Pupils: Pupils are equal, round, and reactive to light.  Neck:     Thyroid: No thyroid mass.     Trachea: No tracheal deviation.  Cardiovascular:     Rate and Rhythm: Normal rate and regular rhythm.     Heart sounds: Normal heart sounds. No murmur heard.    No gallop.  Pulmonary:     Effort: Tachypnea and respiratory distress present.     Breath sounds: No stridor. Examination of the right-lower field reveals rales. Examination of the left-lower field reveals rales. Rales present. No decreased breath sounds, wheezing or rhonchi.  Abdominal:     General: There is no distension.     Palpations: Abdomen is soft.     Tenderness: There is no abdominal tenderness. There is no rebound.  Musculoskeletal:        General: No tenderness. Normal range of motion.     Cervical back: Normal range of motion and neck supple.  Skin:    General: Skin is warm and dry.     Findings: No abrasion or rash.  Neurological:     Mental Status: She is alert and oriented to person, place, and time. Mental status is at baseline.     GCS: GCS eye subscore is 4. GCS verbal subscore is 5. GCS motor subscore is 6.     Cranial Nerves: No cranial nerve deficit.     Sensory: No sensory deficit.     Motor: Motor function is intact.  Psychiatric:        Attention and Perception: Attention normal.        Speech: Speech normal.        Behavior: Behavior normal.     ED Results / Procedures / Treatments   Labs (all labs ordered  are listed, but only abnormal results are displayed) Labs Reviewed  CBC  BRAIN NATRIURETIC PEPTIDE  COMPREHENSIVE METABOLIC PANEL WITH GFR    EKG EKG Interpretation Date/Time:  Thursday November 06 2023 08:01:09 EDT Ventricular Rate:  97 PR Interval:  148 QRS Duration:  80 QT Interval:  360 QTC Calculation: 458 R Axis:   25  Text Interpretation: Sinus  rhythm Nonspecific T abnormalities, lateral leads No significant change since last tracing Confirmed by Lorre Nick ([id number]) on 11/06/2023 8:33:40 AM  Radiology No results found.  Procedures Procedures    Medications Ordered in ED Medications - No data to display  ED Course/ Medical Decision Making/ A&P                                 Medical Decision Making Amount and/or Complexity of Data Reviewed Labs: ordered. Radiology: ordered.  Risk Prescription drug management.   Patient with some wheezing here.  Given albuterol with Atrovent.  Also given Solu-Medrol.  EKG showed no acute ischemic changes.  Initially thought that patient might have CHF but chest x-ray shows no signs of severe failure.  BNP is not elevated.  Considered PE but D-dimer is negative.  At this point feel that patient's dyspnea is likely from COPD exacerbation as he has history of same.  COVID and flu test negative.  Plan will be to admit to the hospitalist team           Final Clinical Impression(s) / ED Diagnoses Final diagnoses:  None    Rx / DC Orders ED Discharge Orders     None         Lorre Nick, MD 11/06/23 1130

## 2023-11-06 NOTE — ED Notes (Signed)
 Per pt, pt on 2L at baseline. Pt entering triage RA. Noted O2 91% on RA. Placed pt on 2L O2 sat 93%. Triage RN notified

## 2023-11-06 NOTE — H&P (Signed)
 History and Physical  Cassandra Burns:[id number] DOB: 05/29/1966 DOA: 11/06/2023  PCP: Sharmon Revere, MD   Chief Complaint: Cough, wheezing, shortness of breath  HPI: Cassandra Burns is a 58 y.o. female with medical history significant for hypertension, iron deficiency anemia, sleep apnea, COPD on as needed oxygen as well as CHF on as needed Lasix being admitted to the hospital with cough, hypoxia due to acute exacerbation of COPD.  Patient states over the past week or so since she traveled to [place] on the train, she has noticed increased dyspnea with exertion, nonproductive cough and some mild orthopnea.  Denies any lower extremity edema, chest pain, fevers, or sputum production.  Has also noticed that she has been wheezing.  Last night, she got particularly short of breath so decided to come to the ER for evaluation.  Here, as detailed below, pneumonia, PE, heart failure were effectively ruled out.  Patient was treated with breathing treatments, IV steroids and states that her breathing is significantly improved.  Review of Systems: Please see HPI for pertinent positives and negatives. A complete 10 system review of systems are otherwise negative.  Past Medical History:  Diagnosis Date   Anxiety    Asthma    Bone pain 06/07/2013   Depression    Diabetes mellitus 12/18/2012   NIDDM   Hernia of abdominal cavity 10/25/2013   History of anemia 07/26/2013   Hypertension    Iron deficiency anemia due to chronic blood loss    Lymphadenopathy 06/07/2013   Obesity 12/18/2012   Psoriasis    Sarcoidosis 07/26/2013   Sleep apnea    uses bipap   Past Surgical History:  Procedure Laterality Date   BIOPSY  09/24/2022   Procedure: BIOPSY;  Surgeon: Lynann Bologna, DO;  Location: [facility];  Service: Gastroenterology;;  esophagogastroduodenoscopy   COLONOSCOPY WITH PROPOFOL N/A 09/24/2022   Procedure: COLONOSCOPY WITH PROPOFOL;  Surgeon: Lynann Bologna, DO;   Location: [facility];  Service: Gastroenterology;  Laterality: N/A;   ESOPHAGOGASTRODUODENOSCOPY (EGD) WITH PROPOFOL N/A 09/24/2022   Procedure: ESOPHAGOGASTRODUODENOSCOPY (EGD) WITH PROPOFOL;  Surgeon: Lynann Bologna, DO;  Location: [facility];  Service: Gastroenterology;  Laterality: N/A;   POLYPECTOMY  09/24/2022   Procedure: POLYPECTOMY;  Surgeon: Lynann Bologna, DO;  Location: [facility];  Service: Gastroenterology;;   TUBAL LIGATION     Social History:  reports that she quit smoking about 6 years ago. Her smoking use included cigars. She has never used smokeless tobacco. She reports current alcohol use. She reports that she does not currently use drugs after having used the following drugs: Marijuana.  Allergies  Allergen Reactions   Oxycontin [Oxycodone]     "Too sleepy; had to give me something to knock it out last time I was in hospital"    Family History  Problem Relation Age of Onset   Diabetes type II Father    Asthma Father    Hypertension Father    Heart murmur Father    Alcohol abuse Father    Drug abuse Father    Hypertension Mother    Alcohol abuse Mother    Drug abuse Mother      Prior to Admission medications   Medication Sig Start Date End Date Taking? Authorizing Provider  amLODipine (NORVASC) 10 MG tablet Take 1 tablet (10 mg total) by mouth daily after lunch. Patient taking differently: Take 10 mg by mouth in the morning. 09/30/21  Yes Meredeth Ide, MD  FEROSUL 325 [phone] Fe)  MG tablet Take 325 mg by mouth daily. 08/22/23  Yes [provider]  labetalol (NORMODYNE) 100 MG tablet Take 100 mg by mouth in the morning. 10/27/19  Yes [provider]  losartan-hydrochlorothiazide (HYZAAR) 100-25 MG tablet Take 1 tablet by mouth in the morning.   Yes [provider]  rosuvastatin (CRESTOR) 10 MG tablet Take 10 mg by mouth daily. 08/22/23  Yes [provider]  albuterol (PROVENTIL) (2.5 MG/3ML) 0.083% nebulizer solution Take  3 mLs (2.5 mg total) by nebulization every 6 (six) hours as needed for wheezing or shortness of breath. 05/19/17   Bevelyn Ngo, NP  albuterol (VENTOLIN HFA) 108 (90 Base) MCG/ACT inhaler Inhale 1-2 puffs into the lungs every 6 (six) hours as needed for wheezing or shortness of breath. 11/09/19   Glenford Bayley, NP  allopurinol (ZYLOPRIM) 100 MG tablet Take 100 mg by mouth in the morning.    [provider]  ciclopirox (PENLAC) 8 % solution Apply topically at bedtime. Apply over nail and surrounding skin. Apply daily over previous coat. After seven (7) days, may remove with alcohol and continue cycle. 12/13/22   Standiford, Jenelle Mages, DPM  cyclobenzaprine (FLEXERIL) 5 MG tablet Take 1 tablet (5 mg total) by mouth 2 (two) times daily as needed for muscle spasms. 07/14/22   Wynetta Fines, MD  DULoxetine (CYMBALTA) 30 MG capsule Take 1 capsule (30 mg total) by mouth daily. 03/25/23   Levert Feinstein, MD  DULoxetine (CYMBALTA) 60 MG capsule Take 1 capsule (60 mg total) by mouth daily. 03/25/23   Levert Feinstein, MD  ezetimibe (ZETIA) 10 MG tablet Take 1 tablet (10 mg total) by mouth daily. 07/24/23   Lewayne Bunting, MD  Fluticasone-Umeclidin-Vilant (TRELEGY ELLIPTA) 200-62.5-25 MCG/ACT AEPB Inhale 1 puff into the lungs daily.    [provider]  furosemide (LASIX) 20 MG tablet Take 1 tablet (20 mg total) by mouth daily as needed for edema. 01/07/23   Carlos Levering, NP  gabapentin (NEURONTIN) 800 MG tablet Take 800 mg by mouth 3 (three) times daily. 09/14/20   [provider]  lidocaine-prilocaine (EMLA) cream 1 gram Qid prn 03/25/23   Levert Feinstein, MD  [facility] 10 MG/0.5ML Pen Inject 10 mg into the skin every Tuesday. 03/21/22   [provider]  MOUNJARO 15 MG/0.5ML Pen 15 mg. 05/13/23   [provider]  nortriptyline (PAMELOR) 25 MG capsule Take 2 capsules (50 mg total) by mouth at bedtime. 03/25/23   Levert Feinstein, MD  OXYGEN Inhale 2-3 L into the lungs  continuous.    [provider]  rosuvastatin (CRESTOR) 40 MG tablet Take 1 tablet by mouth once daily 07/30/22   Lewayne Bunting, MD  SYNJARDY XR 25-1000 MG TB24 Take 1 tablet by mouth in the morning. 09/25/21   [provider]  TOUJEO SOLOSTAR 300 UNIT/ML Solostar Pen Inject 40 Units into the skin 2 (two) times daily after a meal. Patient taking differently: Inject 65 Units into the skin 2 (two) times daily after a meal. 04/27/21   Narda Bonds, MD    Physical Exam: BP (!) 161/97   Pulse (!) 104   Temp 98 F (36.7 C) (Oral)   Resp 19   Ht 4\' 11"  (1.499 m)   Wt 92.1 kg   LMP 06/13/2012   SpO2 97%   BMI 41.00 kg/m  General:  Alert, oriented, calm, in no acute distress, wearing 2 L nasal cannula oxygen.  Her godson is at the  bedside.  She is speaking in full sentences, no cough. Eyes: EOMI, clear conjuctivae, white sclerea Neck: supple, no masses, trachea mildline  Cardiovascular: RRR, no murmurs or rubs, no peripheral edema  Respiratory: clear to auscultation bilaterally, no wheezes, no crackles  Abdomen: soft, nontender, nondistended, normal bowel tones heard  Skin: dry, no rashes  Musculoskeletal: no joint effusions, normal range of motion  Psychiatric: appropriate affect, normal speech  Neurologic: extraocular muscles intact, clear speech, moving all extremities with intact sensorium         Labs on Admission:  Basic Metabolic Panel: Recent Labs  Lab 11/06/23 0839  NA 138  K 4.3  CL 105  CO2 23  GLUCOSE 119*  BUN 20  CREATININE 0.90  CALCIUM 10.0   Liver Function Tests: Recent Labs  Lab 11/06/23 0839  AST 24  ALT 20  ALKPHOS 117  BILITOT 0.6  PROT 8.9*  ALBUMIN 4.3   No results for input(s): "LIPASE", "AMYLASE" in the last 168 hours. No results for input(s): "AMMONIA" in the last 168 hours. CBC: Recent Labs  Lab 11/06/23 0839  WBC 7.9  HGB 14.1  HCT 48.4*  MCV 73.2*  PLT 302   Cardiac Enzymes: No results for input(s):  "CKTOTAL", "CKMB", "CKMBINDEX", "TROPONINI" in the last 168 hours. BNP (last 3 results) Recent Labs    11/06/23 0839  BNP 56.8    ProBNP (last 3 results) No results for input(s): "PROBNP" in the last 8760 hours.  CBG: No results for input(s): "GLUCAP" in the last 168 hours.  Radiological Exams on Admission: DG Chest Port 1 View Result Date: 11/06/2023 CLINICAL DATA:  Shortness of breath EXAM: PORTABLE CHEST 1 VIEW COMPARISON:  09/05/2022 FINDINGS: Chronic cardiopericardial enlargement. Prominent atheromatous calcification of the aorta. Indistinct density at the bases which is stable and likely chronic lung disease. No air bronchogram, Kerley lines, effusion, or pneumothorax. IMPRESSION: Cardiomegaly and chronic lung scarring. No acute finding when compared to prior. Electronically Signed   By: Tiburcio Pea M.D.   On: 11/06/2023 08:46   Assessment/Plan JILL STOPKA is a 58 y.o. female with medical history significant for hypertension, iron deficiency anemia, sleep apnea, COPD on as needed oxygen as well as CHF on as needed Lasix being admitted to the hospital with cough, hypoxia due to acute exacerbation of COPD.   Acute exacerbation of COPD-with increased wheezing, cough, dyspnea with exertion, and oxygen requirement.  At baseline, she is not requiring oxygen but has it at home as needed.  No evidence of acute infection.  D-dimer and BNP in the normal range. -Observation admission -Supplemental oxygen as needed, wean as tolerated -Scheduled DuoNebs, as needed albuterol -IV Solu-Medrol -Incentive spirometer and flutter valve  OSA-CPAP at night  DM2-carb modified diet, with moderate dose sliding scale insulin  Hyperlipidemia-statin and Zetia continued  Hypertension-Home labetalol, amlodipine, Cozaar, HydroDIURIL continued  Iron deficiency anemia-continue iron  DVT prophylaxis: Lovenox     Code Status: Full Code  Consults called: None  Admission status:  Observation  Time spent: 49 minutes  Cloys Vera Sharlette Dense MD [facility] Pager [phone]  If 7PM-7AM, please contact night-coverage [email or web address] Password [email or web address]  11/06/2023, 11:59 AM

## 2023-11-06 NOTE — ED Notes (Signed)
 Dr made aware of pt's blood pressure  188/100

## 2023-11-06 NOTE — ED Triage Notes (Signed)
 Pt arrived via POV. C/o dry cough that prevents them from sleeping. Cough is worse when they lay and and when the coughing fit starts it makes them throw up.  Hx COPD, CHF.  On 2L [place] at baseline, normal O2 sats 92%

## 2023-11-07 DIAGNOSIS — J441 Chronic obstructive pulmonary disease with (acute) exacerbation: Secondary | ICD-10-CM | POA: Diagnosis not present

## 2023-11-07 LAB — BASIC METABOLIC PANEL WITH GFR
Anion gap: 10 (ref 5–15)
BUN: 32 mg/dL — ABNORMAL HIGH (ref 6–20)
CO2: 23 mmol/L (ref 22–32)
Calcium: 9.6 mg/dL (ref 8.9–10.3)
Chloride: 103 mmol/L (ref 98–111)
Creatinine, Ser: 1.52 mg/dL — ABNORMAL HIGH (ref 0.44–1.00)
GFR, Estimated: 40 mL/min — ABNORMAL LOW (ref >60)
Glucose, Bld: 127 mg/dL — ABNORMAL HIGH (ref 70–99)
Potassium: 4.2 mmol/L (ref 3.5–5.1)
Sodium: 136 mmol/L (ref 135–145)

## 2023-11-07 LAB — GLUCOSE, CAPILLARY
Glucose-Capillary: 131 mg/dL — ABNORMAL HIGH (ref 70–99)
Glucose-Capillary: 191 mg/dL — ABNORMAL HIGH (ref 70–99)
Glucose-Capillary: 180 mg/dL — ABNORMAL HIGH (ref 70–99)
Glucose-Capillary: 182 mg/dL — ABNORMAL HIGH (ref 70–99)

## 2023-11-07 LAB — CBC
HCT: 47.3 % — ABNORMAL HIGH (ref 36.0–46.0)
Hemoglobin: 13.3 g/dL (ref 12.0–15.0)
MCH: 21.7 pg — ABNORMAL LOW (ref 26.0–34.0)
MCHC: 28.1 g/dL — ABNORMAL LOW (ref 30.0–36.0)
MCV: 77.2 fL — ABNORMAL LOW (ref 80.0–100.0)
Platelets: 283 10*3/uL (ref 150–400)
RBC: 6.13 MIL/uL — ABNORMAL HIGH (ref 3.87–5.11)
RDW: 22.2 % — ABNORMAL HIGH (ref 11.5–15.5)
WBC: 9.1 10*3/uL (ref 4.0–10.5)
nRBC: 0 % (ref 0.0–0.2)

## 2023-11-07 MED ORDER — LACTATED RINGERS IV SOLN: INTRAVENOUS | Status: AC

## 2023-11-07 MED ORDER — IPRATROPIUM-ALBUTEROL 0.5-2.5 (3) MG/3ML IN SOLN
3.0000 mL | Freq: Three times a day (TID) | RESPIRATORY_TRACT | Status: DC
  Administered 2023-11-08 – 2023-11-09 (×5): 3 mL via RESPIRATORY_TRACT
  Filled 2023-11-07 (×5): qty 3

## 2023-11-07 NOTE — Hospital Course (Addendum)
 57yo with h/o HTN, IDA, OSA on CPAP, COPD on prn home O2, and diastolic CHF who presented on 1/61 with cough, hypoxia.  She was diagnosed with COPD exacerbation.

## 2023-11-07 NOTE — TOC Initial Note (Addendum)
 Transition of Care [facility]) - Initial/Assessment Note    Patient Details  Name: Cassandra Burns MRN: [id number] Date of Birth: 06/06/1966  Transition of Care [facility]) CM/SW Contact:    Jessie Foot, RN Phone Number: 11/07/2023, 4:28 PM  Clinical Narrative:                 MOON. Presented for cough from home alone. Patient states she lives in an apartment, drives times to appointment; Verified PCP and insurance; DME rollator and on oxygen at 2 liter via nasal cannual and BiPAP at night with [facility].; she said she has a portable oxygen in the room for transport home at discharge; and will call a Benedetto Goad. Patient asked NCM to order a new BiPAP machine and mask since equipment is greater than 78 years old. Informed patient to contact pulmonologist [facility]). TOC will follow fro progression to dischrge.  Expected Discharge Plan: Home/Self Care Barriers to Discharge: Continued Medical Work up   Patient Goals and CMS Choice Patient states their goals for this hospitalization and ongoing recovery are:: Return home          Expected Discharge Plan and Services   Discharge Planning Services: CM Consult   Living arrangements for the past 2 months: Apartment                 DME Arranged: N/A DME Agency: NA                  Prior Living Arrangements/Services Living arrangements for the past 2 months: Apartment Lives with:: Self Patient language and need for interpreter reviewed:: No Do you feel safe going back to the place where you live?: Yes      Need for Family Participation in Patient Care: No (Comment) Care giver support system in place?: Yes (comment) Current home services: DME (Oxygen with [facility]) Criminal Activity/Legal Involvement Pertinent to Current Situation/Hospitalization: No - Comment as needed  Activities of Daily Living   ADL Screening (condition at time of admission) Independently performs ADLs?: Yes (appropriate for developmental age) Is  the patient deaf or have difficulty hearing?: No Does the patient have difficulty seeing, even when wearing glasses/contacts?: No Does the patient have difficulty concentrating, remembering, or making decisions?: No  Permission Sought/Granted Permission sought to share information with : Case Manager Permission granted to share information with : Yes, Verbal Permission Granted              Emotional Assessment Appearance:: Appears older than stated age Attitude/Demeanor/Rapport: Engaged Affect (typically observed): Accepting, Appropriate Orientation: : Oriented to Self, Oriented to Place, Oriented to  Time, Oriented to Situation Alcohol / Substance Use: Not Applicable Psych Involvement: No (comment)  Admission diagnosis:  COPD with acute exacerbation (HCC) [J44.1] Dyspnea, unspecified type [R06.00] Patient Active Problem List   Diagnosis Date Noted   COPD with acute exacerbation (HCC) 11/06/2023   Neuropathy 03/25/2023   Neuropathic pain 03/25/2023   Gait abnormality 03/25/2023   Preoperative respiratory examination 06/27/2022   Leg pain 09/29/2021   Somnolence 09/29/2021   AKI (acute kidney injury) (HCC) 09/27/2021   Elevated troponin 09/27/2021   Gout 09/27/2021   Diabetic neuropathy (HCC) 09/27/2021   HLD (hyperlipidemia) 09/27/2021   Chronic respiratory failure with hypoxia (HCC) 04/23/2021   Marijuana use 01/03/2021   Asthma 05/19/2017   HTN (hypertension)    SOB (shortness of breath)    Rectal bleeding 04/14/2014   Diastasis recti 11/04/2013   Hernia of abdominal cavity 10/25/2013  Pulmonary sarcoidosis (HCC) 07/26/2013   Deficiency anemia 07/26/2013   Bone pain 06/07/2013   Lymphadenopathy 06/07/2013   Obesity hypoventilation syndrome 04/06/2013   Depression, major, recurrent, moderate (HCC) 01/19/2013   Generalized anxiety disorder 01/19/2013   OSA (obstructive sleep apnea) 11/19/2012   Mediastinal lymphadenopathy 11/03/2012   DOE (dyspnea on exertion)  11/03/2012   DM (diabetes mellitus) (HCC) 11/07/2011   Morbid obesity (HCC) 11/07/2011   Essential hypertension    PCP:  Sharmon Revere, MD Pharmacy:   [id number] - [place] - [address] [place] [address] Phone: [phone] Fax: [phone]  [id number] - [place], [place] - [address] AT [facility] [address] [place] [address] Phone: [phone] Fax: [phone]     Social Drivers of Health (SDOH) Social History: SDOH Screenings   Food Insecurity: No Food Insecurity (11/06/2023)  Housing: Low Risk  (11/06/2023)  Transportation Needs: No Transportation Needs (11/06/2023)  Utilities: Not At Risk (11/06/2023)  Depression (PHQ2-9): Low Risk  (01/17/2022)  Recent Concern: Depression (PHQ2-9) - Medium Risk (11/14/2021)  Social Connections: Socially Isolated (11/06/2023)  Tobacco Use: Medium Risk (11/06/2023)   SDOH Interventions:     Readmission Risk Interventions     No data to display

## 2023-11-07 NOTE — Care Management Obs Status (Signed)
 MEDICARE OBSERVATION STATUS NOTIFICATION   Patient Details  Name: Cassandra Burns MRN: [id number] Date of Birth: [birth year]   Medicare Observation Status Notification Given:  Yes    Jessie Foot, RN 11/07/2023, 1:25 PM

## 2023-11-07 NOTE — Progress Notes (Signed)
 PROGRESS NOTE Cassandra Burns  ZOX:[id number] DOB: [birth year] DOA: 11/06/2023 PCP: Sharmon Revere, MD  Brief Narrative/Hospital Course: 59 y.o. female with medical history significant for hypertension, iron deficiency anemia, sleep apnea, COPD on as needed oxygen as well as CHF on as needed Lasix being admitted to the hospital with cough, hypoxia due to acute exacerbation of COPD.  over the past week or so since she traveled to [place] last Wednesday when she came back on the train, she has noticed increased dyspnea with exertion, nonproductive cough and some mild orthopnea. In ED- VSS  needing 2l [place] bp High, no fever. Labs- stable CMP, CBC bnp 56,  d dimer < 0.27 CXR-Cardiomegaly and chronic lung scarring. No acute finding when compared to prior.  Subjective: Seen examined Feels some better Overnight afebrile with mild tachycardia h on 2l [place], CPAP  Labs reviewed creatinine up 0.9> 1.5, troponin slightly up to 21> 20.  Assessment/Plan   Acute exacerbation of COPD: Symptoms are positional mostly on laying on bed but recent travel d dimer negative. CXR as above- bnp normal. Wheezing has improved, continue BiPAP, supplemental oxygen, - uses 2l [place] PRN and at night. Uses BIPAP at home.  Cont  supplemental oxygen  prn, DuoNebs, as needed albuterol, Change solu-Medrol to po soon.Encourage IS, FV Obtain echocardiogram.   AKI: No hypertension episodes.  Did not received IV contrast.  Patient's Maxide/Cozaar.  Monitor renal function Nephrectomy history  OSA-CPAP at night: Cont same  Mildly elevated troponin, flat likely demand ischemia: In the setting of COPD exacerbation.  Getting tte  DM2-carb modified diet,: Cont ssi cbg stable  Hyperlipidemia: Cont statin and Zetia   Hypertension: Cont home labetalol, amlodipine   Iron deficiency anemia: Stable, continue iron   Morbid Obesity w/ Body mass index is 41 kg/m.: Will benefit with PCP follow-up, weight loss,healthy  lifestyle and outpatient sleep eval if not done.   DVT prophylaxis: enoxaparin (LOVENOX) injection 40 mg Start: 11/06/23 1215 Code Status:   Code Status: Full Code Family Communication: plan of care discussed with patient at bedside. Patient status is: Remains hospitalized because of severity of illness Level of care: Med-Surg   Dispo: The patient is from: home            Anticipated disposition: TBD  Objective: Vitals last 24 hrs: Vitals:   11/07/23 0547 11/07/23 0937 11/07/23 0943 11/07/23 1224  BP: 129/83  129/83 131/81  Pulse: 94   99  Resp: 18   18  Temp: 97.6 F (36.4 C)   (!) 97.5 F (36.4 C)  TempSrc: Oral   Oral  SpO2: 100% 96%  98%  Weight:      Height:       Weight change:   Physical Examination: General exam: alert awake, older than stated age HEENT:Oral mucosa moist, Ear/Nose WNL grossly Respiratory system: Bilaterally diminished BS, no use of accessory muscle Cardiovascular system: S1 & S2 +. Gastrointestinal system: Abdomen soft, NT,ND,BS+ Nervous System: Alert, awake,following commands. Extremities: LE edema neg, moving arms and legs, warm legs Skin: No rashes,warm. MSK: Normal muscle bulk/tone.   Medications reviewed:  Scheduled Meds:  amLODipine  10 mg Oral q AM   enoxaparin (LOVENOX) injection  40 mg Subcutaneous Daily   ezetimibe  10 mg Oral Daily   ferrous sulfate  325 mg Oral Daily   gabapentin  600 mg Oral TID   insulin aspart  0-15 Units Subcutaneous TID WC   insulin aspart  0-5 Units Subcutaneous QHS   ipratropium-albuterol  3 mL Nebulization QID   labetalol  100 mg Oral q AM   methylPREDNISolone (SOLU-MEDROL) injection  40 mg Intravenous Q12H   rosuvastatin  10 mg Oral Daily   Continuous Infusions:  lactated ringers 100 mL/hr at 11/07/23 1000      Diet Order             Diet Carb Modified Fluid consistency: Thin; Room service appropriate? Yes  Diet effective now                          Intake/Output Summary (Last 24  hours) at 11/07/2023 1236 Last data filed at 11/06/2023 1400 Gross per 24 hour  Intake 120 ml  Output --  Net 120 ml   Net IO Since Admission: 120 mL [11/07/23 1236]  Wt Readings from Last 3 Encounters:  11/06/23 92.1 kg  07/21/23 98.4 kg  03/25/23 102.5 kg    Unresulted Labs (From admission, onward)     Start     Ordered   11/07/23 0500  Hemoglobin A1c  (Glycemic Control (SSI)  Q 4 Hours / Glycemic Control (SSI)  AC +/- HS)  Tomorrow morning,   R       Comments: To assess prior glycemic control    11/06/23 1159          Data Reviewed: I have personally reviewed following labs and imaging studies ( see epic result tab) CBC: Recent Labs  Lab 11/06/23 0839 11/07/23 0339  WBC 7.9 9.1  HGB 14.1 13.3  HCT 48.4* 47.3*  MCV 73.2* 77.2*  PLT 302 283   CMP: Recent Labs  Lab 11/06/23 0839 11/07/23 0339  NA 138 136  K 4.3 4.2  CL 105 103  CO2 23 23  GLUCOSE 119* 127*  BUN 20 32*  CREATININE 0.90 1.52*  CALCIUM 10.0 9.6   GFR: Estimated Creatinine Clearance: 40 mL/min (A) (by C-G formula based on SCr of 1.52 mg/dL (H)). Recent Labs  Lab 11/06/23 0839  AST 24  ALT 20  ALKPHOS 117  BILITOT 0.6  PROT 8.9*  ALBUMIN 4.3   No results for input(s): "HGBA1C" in the last 72 hours. Recent Labs  Lab 11/06/23 1311 11/06/23 1613 11/06/23 2152 11/07/23 0729 11/07/23 1141  GLUCAP 162* 167* 181* 131* 191*   No results for input(s): "CHOL", "HDL", "LDLCALC", "TRIG", "CHOLHDL", "LDLDIRECT" in the last 72 hours. No results for input(s): "TSH", "T4TOTAL", "FREET4", "T3FREE", "THYROIDAB" in the last 72 hours. Sepsis Labs:No results for input(s): "PROCALCITON", "LATICACIDVEN" in the last 168 hours. Recent Results (from the past 240 hours)  Resp panel by RT-PCR (RSV, Flu A&B, Covid) Anterior Nasal Swab     Status: None   Collection Time: 11/06/23  8:50 AM   Specimen: Anterior Nasal Swab  Result Value Ref Range Status   SARS Coronavirus 2 by RT PCR NEGATIVE NEGATIVE Final     Comment: (NOTE) SARS-CoV-2 target nucleic acids are NOT DETECTED.  The SARS-CoV-2 RNA is generally detectable in upper respiratory specimens during the acute phase of infection. The lowest concentration of SARS-CoV-2 viral copies this assay can detect is 138 copies/mL. A negative result does not preclude SARS-Cov-2 infection and should not be used as the sole basis for treatment or other patient management decisions. A negative result may occur with  improper specimen collection/handling, submission of specimen other than nasopharyngeal swab, presence of viral mutation(s) within the areas targeted by this assay, and inadequate number of viral copies(<138 copies/mL). A  negative result must be combined with clinical observations, patient history, and epidemiological information. The expected result is Negative.  Fact Sheet for Patients:  BloggerCourse.com  Fact Sheet for Healthcare Providers:  SeriousBroker.it  This test is no t yet approved or cleared by the [place] FDA and  has been authorized for detection and/or diagnosis of SARS-CoV-2 by FDA under an Emergency Use Authorization (EUA). This EUA will remain  in effect (meaning this test can be used) for the duration of the COVID-19 declaration under Section 564(b)(1) of the Act, 21 U.S.C.section 360bbb-3(b)(1), unless the authorization is terminated  or revoked sooner.       Influenza A by PCR NEGATIVE NEGATIVE Final   Influenza B by PCR NEGATIVE NEGATIVE Final    Comment: (NOTE) The Xpert Xpress SARS-CoV-2/FLU/RSV plus assay is intended as an aid in the diagnosis of influenza from Nasopharyngeal swab specimens and should not be used as a sole basis for treatment. Nasal washings and aspirates are unacceptable for Xpert Xpress SARS-CoV-2/FLU/RSV testing.  Fact Sheet for Patients: BloggerCourse.com  Fact Sheet for Healthcare  Providers: SeriousBroker.it  This test is not yet approved or cleared by the [place] FDA and has been authorized for detection and/or diagnosis of SARS-CoV-2 by FDA under an Emergency Use Authorization (EUA). This EUA will remain in effect (meaning this test can be used) for the duration of the COVID-19 declaration under Section 564(b)(1) of the Act, 21 U.S.C. section 360bbb-3(b)(1), unless the authorization is terminated or revoked.     Resp Syncytial Virus by PCR NEGATIVE NEGATIVE Final    Comment: (NOTE) Fact Sheet for Patients: BloggerCourse.com  Fact Sheet for Healthcare Providers: SeriousBroker.it  This test is not yet approved or cleared by the [place] FDA and has been authorized for detection and/or diagnosis of SARS-CoV-2 by FDA under an Emergency Use Authorization (EUA). This EUA will remain in effect (meaning this test can be used) for the duration of the COVID-19 declaration under Section 564(b)(1) of the Act, 21 U.S.C. section 360bbb-3(b)(1), unless the authorization is terminated or revoked.  Performed at [facility], [address] [place], [place] [address]     Antimicrobials/Microbiology: Anti-infectives (From admission, onward)    None         Component Value Date/Time   SDES  09/27/2021 0732    BLOOD BLOOD RIGHT HAND Performed at [facility], [address], [place], [place] [address]    SPECREQUEST  09/27/2021 0732    BOTTLES DRAWN AEROBIC ONLY Blood Culture adequate volume Performed at [facility], [address], [place], [place] [address]    CULT  09/27/2021 0732    NO GROWTH 5 DAYS Performed at [facility], [address], [place], [place] [address]    REPTSTATUS 10/02/2021 FINAL 09/27/2021 0732    Radiology Studies: [facility] Chest Port 1 View Result Date: 11/06/2023 CLINICAL DATA:   Shortness of breath EXAM: PORTABLE CHEST 1 VIEW COMPARISON:  09/05/2022 FINDINGS: Chronic cardiopericardial enlargement. Prominent atheromatous calcification of the aorta. Indistinct density at the bases which is stable and likely chronic lung disease. No air bronchogram, Kerley lines, effusion, or pneumothorax. IMPRESSION: Cardiomegaly and chronic lung scarring. No acute finding when compared to prior. Electronically Signed   By: Tiburcio Pea M.D.   On: 11/06/2023 08:46    LOS: 0 days    Total time spent in review of labs and imaging, patient evaluation, formulation of plan, documentation and communication with patient/family: 50 minutes  Lanae Boast, MD [facility] 11/07/2023, 12:36 PM

## 2023-11-07 NOTE — Progress Notes (Signed)
   11/07/23 1951  BiPAP/CPAP/SIPAP  BiPAP/CPAP/SIPAP Pt Type Adult (prefers self placement)  BiPAP/CPAP/SIPAP DREAMSTATIOND  Mask Type Nasal mask  Dentures removed? Not applicable  Mask Size Small  Flow Rate 2 lpm  Patient Home Machine No  Patient Home Mask No  Patient Home Tubing No  Auto Titrate Yes (Imax 25 Emin 6 PS 4-8)  CPAP/SIPAP surface wiped down Yes  Device Plugged into RED Power Outlet Yes  BiPAP/CPAP /SiPAP Vitals  Pulse Rate 85  Resp 18  SpO2 99 %  Bilateral Breath Sounds Diminished;Expiratory wheezes  MEWS Score/Color  MEWS Score 0  MEWS Score Color [place]

## 2023-11-07 NOTE — Plan of Care (Signed)

## 2023-11-07 NOTE — Plan of Care (Signed)
   Problem: Education: Goal: Ability to describe self-care measures that may prevent or decrease complications (Diabetes Survival Skills Education) will improve Outcome: Progressing Goal: Individualized Educational Video(s) Outcome: Progressing

## 2023-11-08 ENCOUNTER — Observation Stay (HOSPITAL_COMMUNITY)

## 2023-11-08 DIAGNOSIS — R0602 Shortness of breath: Secondary | ICD-10-CM

## 2023-11-08 DIAGNOSIS — J441 Chronic obstructive pulmonary disease with (acute) exacerbation: Secondary | ICD-10-CM | POA: Diagnosis not present

## 2023-11-08 LAB — ECHOCARDIOGRAM COMPLETE
AR max vel: 1.6 cm2
AV Area VTI: 1.78 cm2
AV Area mean vel: 1.58 cm2
AV Mean grad: 2 mmHg
AV Peak grad: 5.2 mmHg
Ao pk vel: 1.14 m/s
Area-P 1/2: 5.5 cm2
Height: 59 in
MV VTI: 1.68 cm2
S' Lateral: 2.6 cm
Single Plane A4C EF: 75.9 %
Weight: 3248 [oz_av]

## 2023-11-08 LAB — BASIC METABOLIC PANEL WITH GFR
Anion gap: 7 (ref 5–15)
BUN: 44 mg/dL — ABNORMAL HIGH (ref 6–20)
CO2: 25 mmol/L (ref 22–32)
Calcium: 9.2 mg/dL (ref 8.9–10.3)
Chloride: 102 mmol/L (ref 98–111)
Creatinine, Ser: 1.34 mg/dL — ABNORMAL HIGH (ref 0.44–1.00)
GFR, Estimated: 46 mL/min — ABNORMAL LOW (ref >60)
Glucose, Bld: 172 mg/dL — ABNORMAL HIGH (ref 70–99)
Potassium: 4.5 mmol/L (ref 3.5–5.1)
Sodium: 134 mmol/L — ABNORMAL LOW (ref 135–145)

## 2023-11-08 LAB — GLUCOSE, CAPILLARY
Glucose-Capillary: 138 mg/dL — ABNORMAL HIGH (ref 70–99)
Glucose-Capillary: 175 mg/dL — ABNORMAL HIGH (ref 70–99)
Glucose-Capillary: 180 mg/dL — ABNORMAL HIGH (ref 70–99)
Glucose-Capillary: 195 mg/dL — ABNORMAL HIGH (ref 70–99)
Glucose-Capillary: 235 mg/dL — ABNORMAL HIGH (ref 70–99)

## 2023-11-08 LAB — HEMOGLOBIN A1C
Hgb A1c MFr Bld: 6.5 % — ABNORMAL HIGH (ref 4.8–5.6)
Mean Plasma Glucose: 140 mg/dL

## 2023-11-08 LAB — CBC
HCT: 41.6 % (ref 36.0–46.0)
Hemoglobin: 12.6 g/dL (ref 12.0–15.0)
MCH: 21.9 pg — ABNORMAL LOW (ref 26.0–34.0)
MCHC: 30.3 g/dL (ref 30.0–36.0)
MCV: 72.2 fL — ABNORMAL LOW (ref 80.0–100.0)
Platelets: 253 10*3/uL (ref 150–400)
RBC: 5.76 MIL/uL — ABNORMAL HIGH (ref 3.87–5.11)
RDW: 21.2 % — ABNORMAL HIGH (ref 11.5–15.5)
WBC: 12 10*3/uL — ABNORMAL HIGH (ref 4.0–10.5)
nRBC: 0 % (ref 0.0–0.2)

## 2023-11-08 MED ORDER — ALUM & MAG HYDROXIDE-SIMETH 200-200-20 MG/5ML PO SUSP
30.0000 mL | Freq: Four times a day (QID) | ORAL | Status: DC | PRN
  Administered 2023-11-08: 30 mL via ORAL
  Filled 2023-11-08: qty 30

## 2023-11-08 MED ORDER — LIDOCAINE 5 % EX PTCH
1.0000 | MEDICATED_PATCH | Freq: Every day | CUTANEOUS | Status: DC
  Administered 2023-11-08 – 2023-11-09 (×2): 1 via TRANSDERMAL
  Filled 2023-11-08 (×2): qty 1

## 2023-11-08 MED ORDER — PANTOPRAZOLE SODIUM 40 MG PO TBEC
40.0000 mg | DELAYED_RELEASE_TABLET | Freq: Every day | ORAL | Status: DC
  Administered 2023-11-08 – 2023-11-09 (×2): 40 mg via ORAL
  Filled 2023-11-08 (×2): qty 1

## 2023-11-08 MED ORDER — TRAMADOL HCL 50 MG PO TABS
50.0000 mg | ORAL_TABLET | Freq: Two times a day (BID) | ORAL | Status: DC | PRN
  Administered 2023-11-08: 50 mg via ORAL
  Filled 2023-11-08: qty 1

## 2023-11-08 MED ORDER — PERFLUTREN LIPID MICROSPHERE
1.0000 mL | INTRAVENOUS | Status: AC | PRN
  Administered 2023-11-08: 3 mL via INTRAVENOUS

## 2023-11-08 MED ORDER — PREDNISONE 20 MG PO TABS
20.0000 mg | ORAL_TABLET | Freq: Every day | ORAL | Status: DC
  Administered 2023-11-09: 20 mg via ORAL
  Filled 2023-11-08: qty 1

## 2023-11-08 NOTE — Plan of Care (Signed)
  Problem: Coping: Goal: Ability to adjust to condition or change in health will improve Outcome: Progressing   Problem: Clinical Measurements: Goal: Ability to maintain clinical measurements within normal limits will improve Outcome: Progressing Goal: Respiratory complications will improve Outcome: Progressing   Problem: Coping: Goal: Level of anxiety will decrease Outcome: Progressing

## 2023-11-08 NOTE — Progress Notes (Signed)
 PROGRESS NOTE Cassandra Burns  ZOX:[id number] DOB: 1965-12-15 DOA: 11/06/2023 PCP: Lorella Roles, MD  Brief Narrative/Hospital Course: 58 y.o. female with medical history significant for hypertension, iron deficiency anemia, sleep apnea, COPD on as needed oxygen as well as CHF on as needed Lasix being admitted to the hospital with cough, hypoxia due to acute exacerbation of COPD.  over the past week or so since she traveled to [place] last Wednesday when she came back on the train, she has noticed increased dyspnea with exertion, nonproductive cough and some mild orthopnea. In ED- VSS  needing 2l [place] bp High, no fever. Labs- stable CMP, CBC bnp 56,  d dimer < 0.27 CXR-Cardiomegaly and chronic lung scarring. No acute finding when compared to prior. Patient treated for acute COPD exacerbation AKI Creatinine nicely improving, breathing status overall stable.  Patient reports she was supposed to be on oxygen all the time but she uses PRN  Subjective: Seen and examined Overall feeling better. Overnight patient afebrile BP stable on 2 L at home on on 2l [place] prn and BIPAP Labs reviewed creatinine up 0.9> 1.5> 1.3 ON IVF  Assessment and plan  Acute exacerbation of COPD Chronic hypoxemic respiratory failure: Her symptoms are positional at home mostly wrose on laying on bed but recent travel d dimer negative. CXR as above- bnp normal. On presentation had wheezing apparently and has improved since Continue BiPAP not in early along with, supplemental oxygen-was supposed to be on oxygen all the time but she uses PRN. Cont DuoNebs, as needed albuterol, Change solu-Medrol to po.Encourage IS, FV Echo ordered to rule out component of CHF due to positional symptom.   AKI: Unclear etiology no episode of hypotension/NSAID use or contrast use. Held hctz/Cozaar 4/11. Creatinine improving today, monitor Recent Labs    11/14/22 1130 07/21/23 1052 11/06/23 0839 11/07/23 0339 11/08/23 0442   BUN 27* 22 20 32* 44*  CREATININE 1.26* 1.32* 0.90 1.52* 1.34*  CO2 23 22 23 23 25   K 4.4 4.9 4.3 4.2 4.5    OSA-BIPAP at night Cont same  Mildly elevated troponin, flat likely demand ischemia: In the setting of COPD exacerbation.  Follow-up echo  DM2-carb modified diet,: Blood sugar control, cont ssi Recent Labs  Lab 11/07/23 0339 11/07/23 0729 11/07/23 1141 11/07/23 1647 11/07/23 2130 11/08/23 0744 11/08/23 1156  GLUCAP  --    < > 191* 180* 182* 175* 180*  HGBA1C 6.5*  --   --   --   --   --   --    < > = values in this interval not displayed.     Hyperlipidemia: Cont statin and Zetia   Hypertension: Controlled. On labetalol, amlodipine   Iron deficiency anemia: Stable, continue iron  Morbid Obesity w/ Body mass index is 41 kg/m.: Will benefit with PCP follow-up, weight loss,healthy lifestyle and already on BIPAP   DVT prophylaxis: enoxaparin (LOVENOX) injection 40 mg Start: 11/06/23 1215 Code Status:   Code Status: Full Code Family Communication: plan of care discussed with patient at bedside. Patient status is: Remains hospitalized because of severity of illness Level of care: Med-Surg   Dispo: The patient is from: home            Anticipated disposition: TBD  Objective: Vitals last 24 hrs: Vitals:   11/08/23 0937 11/08/23 1032 11/08/23 1100 11/08/23 1154  BP:  (!) 143/83 (!) 140/83 (!) 140/83  Pulse:   96 96  Resp:    18  Temp:    (!)  97.5 F (36.4 C)  TempSrc:    Oral  SpO2: 96%   90%  Weight:      Height:       Weight change:   Physical Examination: General exam: alert awake, oriented at baseline, older than stated age HEENT:Oral mucosa moist, Ear/Nose WNL grossly Respiratory system: Bilaterally diminished,no use of accessory muscle Cardiovascular system: S1 & S2 +, No JVD. Gastrointestinal system: Abdomen soft,NT,ND, BS+ Nervous System: Alert, awake, moving all extremities,and following commands. Extremities: LE edema neg,distal  peripheral pulses palpable and warm.  Skin: No rashes,no icterus. MSK: Normal muscle bulk,tone, power   Medications reviewed:  Scheduled Meds:  amLODipine  10 mg Oral q AM   enoxaparin (LOVENOX) injection  40 mg Subcutaneous Daily   ezetimibe  10 mg Oral Daily   ferrous sulfate  325 mg Oral Daily   gabapentin  600 mg Oral TID   insulin aspart  0-15 Units Subcutaneous TID WC   insulin aspart  0-5 Units Subcutaneous QHS   ipratropium-albuterol  3 mL Nebulization TID   labetalol  100 mg Oral q AM   [START ON 11/09/2023] predniSONE  20 mg Oral Q breakfast   rosuvastatin  10 mg Oral Daily   Continuous Infusions:      Diet Order             Diet Carb Modified Fluid consistency: Thin; Room service appropriate? Yes  Diet effective now                          Intake/Output Summary (Last 24 hours) at 11/08/2023 1237 Last data filed at 11/08/2023 0300 Gross per 24 hour  Intake 2397.97 ml  Output --  Net 2397.97 ml   Net IO Since Admission: 2,517.97 mL [11/08/23 1237]  Wt Readings from Last 3 Encounters:  11/06/23 92.1 kg  07/21/23 98.4 kg  03/25/23 102.5 kg    Unresulted Labs (From admission, onward)     Start     Ordered   11/08/23 0500  Basic metabolic panel with GFR  Daily,   R      11/07/23 1237   11/08/23 0500  CBC  Daily,   R      11/07/23 1237          Data Reviewed: I have personally reviewed following labs and imaging studies ( see epic result tab) CBC: Recent Labs  Lab 11/06/23 0839 11/07/23 0339 11/08/23 0442  WBC 7.9 9.1 12.0*  HGB 14.1 13.3 12.6  HCT 48.4* 47.3* 41.6  MCV 73.2* 77.2* 72.2*  PLT 302 283 253   CMP: Recent Labs  Lab 11/06/23 0839 11/07/23 0339 11/08/23 0442  NA 138 136 134*  K 4.3 4.2 4.5  CL 105 103 102  CO2 23 23 25   GLUCOSE 119* 127* 172*  BUN 20 32* 44*  CREATININE 0.90 1.52* 1.34*  CALCIUM 10.0 9.6 9.2   GFR: Estimated Creatinine Clearance: 45.4 mL/min (A) (by C-G formula based on SCr of 1.34 mg/dL  (H)). Recent Labs  Lab 11/06/23 0839  AST 24  ALT 20  ALKPHOS 117  BILITOT 0.6  PROT 8.9*  ALBUMIN 4.3   Recent Labs    11/07/23 0339  HGBA1C 6.5*   Recent Labs  Lab 11/07/23 1141 11/07/23 1647 11/07/23 2130 11/08/23 0744 11/08/23 1156  GLUCAP 191* 180* 182* 175* 180*   No results for input(s): "CHOL", "HDL", "LDLCALC", "TRIG", "CHOLHDL", "LDLDIRECT" in the last 72 hours. No results for  input(s): "TSH", "T4TOTAL", "FREET4", "T3FREE", "THYROIDAB" in the last 72 hours. Sepsis Labs:No results for input(s): "PROCALCITON", "LATICACIDVEN" in the last 168 hours. Recent Results (from the past 240 hours)  Resp panel by RT-PCR (RSV, Flu A&B, Covid) Anterior Nasal Swab     Status: None   Collection Time: 11/06/23  8:50 AM   Specimen: Anterior Nasal Swab  Result Value Ref Range Status   SARS Coronavirus 2 by RT PCR NEGATIVE NEGATIVE Final    Comment: (NOTE) SARS-CoV-2 target nucleic acids are NOT DETECTED.  The SARS-CoV-2 RNA is generally detectable in upper respiratory specimens during the acute phase of infection. The lowest concentration of SARS-CoV-2 viral copies this assay can detect is 138 copies/mL. A negative result does not preclude SARS-Cov-2 infection and should not be used as the sole basis for treatment or other patient management decisions. A negative result may occur with  improper specimen collection/handling, submission of specimen other than nasopharyngeal swab, presence of viral mutation(s) within the areas targeted by this assay, and inadequate number of viral copies(<138 copies/mL). A negative result must be combined with clinical observations, patient history, and epidemiological information. The expected result is Negative.  Fact Sheet for Patients:  BloggerCourse.com  Fact Sheet for Healthcare Providers:  SeriousBroker.it  This test is no t yet approved or cleared by the [place]  FDA and  has  been authorized for detection and/or diagnosis of SARS-CoV-2 by FDA under an Emergency Use Authorization (EUA). This EUA will remain  in effect (meaning this test can be used) for the duration of the COVID-19 declaration under Section 564(b)(1) of the Act, 21 U.S.C.section 360bbb-3(b)(1), unless the authorization is terminated  or revoked sooner.       Influenza A by PCR NEGATIVE NEGATIVE Final   Influenza B by PCR NEGATIVE NEGATIVE Final    Comment: (NOTE) The Xpert Xpress SARS-CoV-2/FLU/RSV plus assay is intended as an aid in the diagnosis of influenza from Nasopharyngeal swab specimens and should not be used as a sole basis for treatment. Nasal washings and aspirates are unacceptable for Xpert Xpress SARS-CoV-2/FLU/RSV testing.  Fact Sheet for Patients: BloggerCourse.com  Fact Sheet for Healthcare Providers: SeriousBroker.it  This test is not yet approved or cleared by the [place]  FDA and has been authorized for detection and/or diagnosis of SARS-CoV-2 by FDA under an Emergency Use Authorization (EUA). This EUA will remain in effect (meaning this test can be used) for the duration of the COVID-19 declaration under Section 564(b)(1) of the Act, 21 U.S.C. section 360bbb-3(b)(1), unless the authorization is terminated or revoked.     Resp Syncytial Virus by PCR NEGATIVE NEGATIVE Final    Comment: (NOTE) Fact Sheet for Patients: BloggerCourse.com  Fact Sheet for Healthcare Providers: SeriousBroker.it  This test is not yet approved or cleared by the [place]  FDA and has been authorized for detection and/or diagnosis of SARS-CoV-2 by FDA under an Emergency Use Authorization (EUA). This EUA will remain in effect (meaning this test can be used) for the duration of the COVID-19 declaration under Section 564(b)(1) of the Act, 21 U.S.C. section 360bbb-3(b)(1), unless the  authorization is terminated or revoked.  Performed at [facility], [address], [place], [place] [address]     Antimicrobials/Microbiology: Anti-infectives (From admission, onward)    None         Component Value Date/Time   SDES  09/27/2021 0732    BLOOD BLOOD RIGHT HAND Performed at [facility], [address], [place], [place] [address]  SPECREQUEST  09/27/2021 0732    BOTTLES DRAWN AEROBIC ONLY Blood Culture adequate volume Performed at [facility], [address] [place], [place] [address]    CULT  09/27/2021 0732    NO GROWTH 5 DAYS Performed at [facility], [address], [place], [place] [address]    REPTSTATUS 10/02/2021 FINAL 09/27/2021 0732    Radiology Studies: No results found.   LOS: 0 days    Total time spent in review of labs and imaging, patient evaluation, formulation of plan, documentation and communication with patient/family: 35 minutes  Lesa Rape, MD [facility] 11/08/2023, 12:37 PM

## 2023-11-08 NOTE — Plan of Care (Signed)
   Problem: Education: Goal: Ability to describe self-care measures that may prevent or decrease complications (Diabetes Survival Skills Education) will improve Outcome: Progressing Goal: Individualized Educational Video(s) Outcome: Progressing

## 2023-11-08 NOTE — Progress Notes (Signed)
  Echocardiogram 2D Echocardiogram has been performed.  Mylo Driskill L Danyon Mcginness RDCS 11/08/2023, 11:08 AM

## 2023-11-08 NOTE — Plan of Care (Signed)

## 2023-11-09 DIAGNOSIS — J441 Chronic obstructive pulmonary disease with (acute) exacerbation: Secondary | ICD-10-CM | POA: Diagnosis not present

## 2023-11-09 LAB — CBC
HCT: 45 % (ref 36.0–46.0)
Hemoglobin: 13.3 g/dL (ref 12.0–15.0)
MCH: 21.8 pg — ABNORMAL LOW (ref 26.0–34.0)
MCHC: 29.6 g/dL — ABNORMAL LOW (ref 30.0–36.0)
MCV: 73.6 fL — ABNORMAL LOW (ref 80.0–100.0)
Platelets: 260 K/uL (ref 150–400)
RBC: 6.11 MIL/uL — ABNORMAL HIGH (ref 3.87–5.11)
RDW: 21.7 % — ABNORMAL HIGH (ref 11.5–15.5)
WBC: 11.4 K/uL — ABNORMAL HIGH (ref 4.0–10.5)
nRBC: 0 % (ref 0.0–0.2)

## 2023-11-09 LAB — BASIC METABOLIC PANEL WITH GFR
Anion gap: 6 (ref 5–15)
BUN: 45 mg/dL — ABNORMAL HIGH (ref 6–20)
CO2: 28 mmol/L (ref 22–32)
Calcium: 9.4 mg/dL (ref 8.9–10.3)
Chloride: 101 mmol/L (ref 98–111)
Creatinine, Ser: 1.25 mg/dL — ABNORMAL HIGH (ref 0.44–1.00)
GFR, Estimated: 50 mL/min — ABNORMAL LOW (ref >60)
Glucose, Bld: 127 mg/dL — ABNORMAL HIGH (ref 70–99)
Potassium: 4.6 mmol/L (ref 3.5–5.1)
Sodium: 135 mmol/L (ref 135–145)

## 2023-11-09 LAB — GLUCOSE, CAPILLARY
Glucose-Capillary: 127 mg/dL — ABNORMAL HIGH (ref 70–99)
Glucose-Capillary: 113 mg/dL — ABNORMAL HIGH (ref 70–99)
Glucose-Capillary: 147 mg/dL — ABNORMAL HIGH (ref 70–99)

## 2023-11-09 MED ORDER — SALINE SPRAY 0.65 % NA SOLN: 1.0000 | NASAL | 0 refills | Status: AC | PRN

## 2023-11-09 MED ORDER — SALINE SPRAY 0.65 % NA SOLN: 1.0000 | NASAL | Status: DC | PRN

## 2023-11-09 MED ORDER — GUAIFENESIN ER 600 MG PO TB12: 1200.0000 mg | ORAL_TABLET | Freq: Two times a day (BID) | ORAL | 0 refills | Status: AC

## 2023-11-09 MED ORDER — PREDNISONE 20 MG PO TABS: 20.0000 mg | ORAL_TABLET | Freq: Every day | ORAL | 0 refills | Status: AC

## 2023-11-09 MED ORDER — GUAIFENESIN ER 600 MG PO TB12: 1200.0000 mg | ORAL_TABLET | Freq: Two times a day (BID) | ORAL | Status: DC

## 2023-11-09 NOTE — Discharge Summary (Signed)
 Physician Discharge Summary   Patient: Cassandra Burns MRN: [id number] DOB: 01-24-1966  Admit date:     11/06/2023  Discharge date: 11/09/23  Discharge Physician: Lorita Rosa   PCP: Lorella Roles, MD   Recommendations at discharge:   Take prednisone daily x 5 days Mucinex and nasal saline added Follow up with Dr. Rito Chess in 1-2 weeks Follow up with Dr. Matilde Son, call for appointment  Discharge Diagnoses: Principal Problem:   COPD with acute exacerbation (HCC) Active Problems:   DM (diabetes mellitus) (HCC)   Essential hypertension   Morbid obesity (HCC)   DOE (dyspnea on exertion)   OSA (obstructive sleep apnea)   Obesity hypoventilation syndrome   HTN (hypertension)   Marijuana use   HLD (hyperlipidemia)    Hospital Course: 58yo with h/o HTN, IDA, OSA on CPAP, COPD on prn home O2, and diastolic CHF who presented on 4/09 with cough, hypoxia.  She was diagnosed with COPD exacerbation.  Assessment and Plan:  Acute exacerbation of COPD Patient with COPD, on 3L home O2 Her symptoms are positional at home mostly worse on laying on bed  CXR unremarkable, bnp normal On presentation had wheezing apparently and has improved since Continue BiPAP at bedtime along with chronic daytime supplemental oxygen Continue DuoNebs, as needed albuterol Solumedrol -> prednisone Echo reassuring Continue Trelegy   Stage 3a CKD Concern for AKI on presentation but appears to have had creatinine at/near baseline of 1.3 throughout  OSA-BIPAP at night Continue, follow up with pulmonology   Mildly elevated troponin, flat likely demand ischemia In the setting of COPD exacerbation Stable echo   DM2-carb modified diet A1c 6.5, good control Continue Synjardy, Toujeo    Hyperlipidemia Continue rosuvastatin    Hypertension Continue labetalol, amlodipine, Hyzaar   Iron deficiency anemia Stable, continue iron  Gout Continue allopurinol  Depression Continue  Cymbalta  Marijuana use Cessation encouraged   Morbid Obesity  Body mass index is 41 kg/m.Aaron Aas  Weight loss should be encouraged Outpatient PCP/bariatric medicine f/u encouraged Significantly low or high BMI is associated with higher medical risk including morbidity and mortality  Continue Mounjaro     Consultants: None  Procedures: Echocardiogram 4/12  Antibiotics: None      Pain control - [place]  Controlled Substance Reporting System database was reviewed. and patient was instructed, not to drive, operate heavy machinery, perform activities at heights, swimming or participation in water activities or provide baby-sitting services while on Pain, Sleep and Anxiety Medications; until their outpatient Physician has advised to do so again. Also recommended to not to take more than prescribed Pain, Sleep and Anxiety Medications.   Disposition: Home Diet recommendation:  Carb modified diet DISCHARGE MEDICATION: Allergies as of 11/09/2023       Reactions   Oxycontin [oxycodone] Other (See Comments)   "Too sleepy; had to give me something to knock it out last time I was in hospital"        Medication List     STOP taking these medications    ezetimibe 10 MG tablet Commonly known as: ZETIA   furosemide 20 MG tablet Commonly known as: Lasix       TAKE these medications    albuterol (2.5 MG/3ML) 0.083% nebulizer solution Commonly known as: PROVENTIL Take 3 mLs (2.5 mg total) by nebulization every 6 (six) hours as needed for wheezing or shortness of breath.   albuterol 108 (90 Base) MCG/ACT inhaler Commonly known as: VENTOLIN HFA Inhale 1-2 puffs into the lungs every 6 (six) hours as  needed for wheezing or shortness of breath.   allopurinol 100 MG tablet Commonly known as: ZYLOPRIM Take 100 mg by mouth in the morning.   amLODipine 10 MG tablet Commonly known as: NORVASC Take 1 tablet (10 mg total) by mouth daily after lunch. What changed: when to take  this   DULoxetine 60 MG capsule Commonly known as: Cymbalta Take 1 capsule (60 mg total) by mouth daily.   FeroSul 325 (65 Fe) MG tablet Generic drug: ferrous sulfate Take 325 mg by mouth daily.   gabapentin 800 MG tablet Commonly known as: NEURONTIN Take 800 mg by mouth 3 (three) times daily.   guaiFENesin 600 MG 12 hr tablet Commonly known as: MUCINEX Take 2 tablets (1,200 mg total) by mouth 2 (two) times daily.   labetalol 100 MG tablet Commonly known as: NORMODYNE Take 100 mg by mouth in the morning.   losartan-hydrochlorothiazide 100-25 MG tablet Commonly known as: HYZAAR Take 1 tablet by mouth in the morning.   Mounjaro 15 MG/0.5ML Pen Generic drug: tirzepatide Inject 15 mg into the skin once a week. Tuesdays   OXYGEN Inhale 2-3 L into the lungs continuous.   predniSONE 20 MG tablet Commonly known as: DELTASONE Take 1 tablet (20 mg total) by mouth daily with breakfast for 5 days. Start taking on: November 10, 2023   rosuvastatin 10 MG tablet Commonly known as: CRESTOR Take 10 mg by mouth daily.   sodium chloride 0.65 % Soln nasal spray Commonly known as: OCEAN Place 1 spray into both nostrils as needed for congestion.   Synjardy XR 25-1000 MG Tb24 Generic drug: Empagliflozin-metFORMIN HCl ER Take 1 tablet by mouth in the morning.   Toujeo SoloStar 300 UNIT/ML Solostar Pen Generic drug: insulin glargine (1 Unit Dial) Inject 40 Units into the skin 2 (two) times daily after a meal. What changed: how much to take   Trelegy Ellipta 200-62.5-25 MCG/ACT Aepb Generic drug: Fluticasone-Umeclidin-Vilant Inhale 1 puff into the lungs daily.        Follow-up Information     Paliwal, Himanshu, MD Follow up in 1 week(s).   Specialty: Family Medicine Contact information: [address] [place] [address] [phone]                Discharge Exam:    Subjective: reports feeling better, on home O2, still coughing.  Using IS and flutter  valves. Planning to f/u with Dr. Matilde Son.  Feels ready to go home.   Objective: Vitals:   11/09/23 1018 11/09/23 1152  BP: 123/70 129/74  Pulse: 80 75  Resp:  18  Temp:  (!) 97.4 F (36.3 C)  SpO2:  98%    Intake/Output Summary (Last 24 hours) at 11/09/2023 1506 Last data filed at 11/09/2023 1300 Gross per 24 hour  Intake 840 ml  Output --  Net 840 ml   Filed Weights   11/06/23 0809  Weight: 92.1 kg    Exam:  General:  Appears calm and comfortable and is in NAD, on [place] O2, sitting up in bedside chair Eyes:   EOMI, normal lids, iris ENT:  grossly normal hearing, lips & tongue, mmm Cardiovascular:  RRR, no m/r/g. No LE edema.  Respiratory:   Mild diffuse expiratory wheezing.  Normal respiratory effort. Abdomen:  soft, NT, ND, obese Skin:  no rash or induration seen on limited exam Musculoskeletal:  grossly normal tone BUE/BLE, good ROM, no bony abnormality Psychiatric:  grossly normal mood and affect, speech fluent and appropriate, AOx3 Neurologic:  CN  2-12 grossly intact, moves all extremities in coordinated fashion  Data Reviewed: I have reviewed the patient's lab results since admission.  Pertinent labs for today include:  Glucose 127 BUN 45/Creatinine 1.25/GFR 50 WBC 11.4     Condition at discharge: improving  The results of significant diagnostics from this hospitalization (including imaging, microbiology, ancillary and laboratory) are listed below for reference.   Imaging Studies: ECHOCARDIOGRAM COMPLETE Result Date: 11/08/2023    ECHOCARDIOGRAM REPORT   Patient Name:   AMILY DEPP Date of Exam: 11/08/2023 Medical Rec #:  [id number]             Height:       59.0 in Accession #:    [id number]            Weight:       203.0 lb Date of Birth:  04/21/1966            BSA:          1.856 m Patient Age:    57 years              BP:           145/83 mmHg Patient Gender: F                     HR:           92 bpm. Exam Location:  Inpatient Procedure: 2D Echo,  Cardiac Doppler, Color Doppler and Intracardiac            Opacification Agent (Both Spectral and Color Flow Doppler were            utilized during procedure). Indications:    CHF  History:        Patient has prior history of Echocardiogram examinations, most                 recent 04/24/2021. COPD, Signs/Symptoms:Shortness of Breath; Risk                 Factors:Hypertension, Diabetes and Sleep Apnea.  Sonographer:    Juanita Shaw Referring Phys: [id number] RAMESH KC  Sonographer Comments: Image acquisition challenging due to patient body habitus. IMPRESSIONS  1. Left ventricular ejection fraction, by estimation, is 70 to 75%. The left ventricle has hyperdynamic function. Left ventricular endocardial border not optimally defined to evaluate regional wall motion. Indeterminate diastolic filling due to E-A fusion.  2. Right ventricular systolic function was not well visualized. The right ventricular size is not well visualized.  3. The mitral valve is normal in structure. No evidence of mitral valve regurgitation. No evidence of mitral stenosis.  4. The aortic valve was not well visualized. Aortic valve regurgitation is not visualized. No aortic stenosis is present.  5. The inferior vena cava is normal in size with greater than 50% respiratory variability, suggesting right atrial pressure of 3 mmHg. Comparison(s): No significant change from prior study. Prior images reviewed side by side. Difficult echo windows (both studies). FINDINGS  Left Ventricle: No 3D or strain transmitted. Left ventricular ejection fraction, by estimation, is 70 to 75%. The left ventricle has hyperdynamic function. Left ventricular endocardial border not optimally defined to evaluate regional wall motion. Strain was performed and the global longitudinal strain is indeterminate. The left ventricular internal cavity size was normal in size. Suboptimal image quality limits for assessment of left ventricular hypertrophy. Indeterminate diastolic  filling due to  E-A fusion. Right Ventricle: The right ventricular size is not well visualized.  Right vetricular wall thickness was not well visualized. Right ventricular systolic function was not well visualized. Left Atrium: Left atrial size was not well visualized. Right Atrium: Right atrial size was not well visualized. Pericardium: There is no evidence of pericardial effusion. Mitral Valve: The mitral valve is normal in structure. No evidence of mitral valve regurgitation. No evidence of mitral valve stenosis. MV peak gradient, 7.4 mmHg. The mean mitral valve gradient is 4.0 mmHg. Tricuspid Valve: The tricuspid valve is normal in structure. Tricuspid valve regurgitation is not demonstrated. No evidence of tricuspid stenosis. Aortic Valve: The aortic valve was not well visualized. Aortic valve regurgitation is not visualized. No aortic stenosis is present. Aortic valve mean gradient measures 2.0 mmHg. Aortic valve peak gradient measures 5.2 mmHg. Aortic valve area, by VTI measures 1.78 cm. Pulmonic Valve: The pulmonic valve was not well visualized. Pulmonic valve regurgitation is not visualized. Aorta: The aortic root and ascending aorta are structurally normal, with no evidence of dilitation. Venous: The inferior vena cava is normal in size with greater than 50% respiratory variability, suggesting right atrial pressure of 3 mmHg. IAS/Shunts: The interatrial septum was not well visualized. Additional Comments: 3D was performed not requiring image post processing on an independent workstation and was indeterminate.  LEFT VENTRICLE PLAX 2D LVIDd:         3.70 cm      Diastology LVIDs:         2.60 cm      LV e' medial:    3.70 cm/s LV PW:         1.00 cm      LV E/e' medial:  25.7 LV IVS:        0.70 cm      LV e' lateral:   4.46 cm/s LVOT diam:     1.70 cm      LV E/e' lateral: 21.3 LV SV:         32 LV SV Index:   17 LVOT Area:     2.27 cm  LV Volumes (MOD) LV vol d, MOD A4C: 139.0 ml LV vol s, MOD A4C: 33.5  ml LV SV MOD A4C:     139.0 ml RIGHT VENTRICLE            IVC RV Basal diam:  3.10 cm    IVC diam: 1.00 cm RV S prime:     7.40 cm/s TAPSE (M-mode): 1.9 cm LEFT ATRIUM             Index        RIGHT ATRIUM           Index LA diam:        3.70 cm 1.99 cm/m   RA Area:     10.90 cm LA Vol (A2C):   36.7 ml 19.77 ml/m  RA Volume:   22.90 ml  12.34 ml/m LA Vol (A4C):   34.0 ml 18.32 ml/m LA Biplane Vol: 38.5 ml 20.74 ml/m  AORTIC VALVE                    PULMONIC VALVE AV Area (Vmax):    1.60 cm     PV Vmax:       0.82 m/s AV Area (Vmean):   1.58 cm     PV Peak grad:  2.7 mmHg AV Area (VTI):     1.78 cm AV Vmax:           114.00 cm/s AV Vmean:  71.700 cm/s AV VTI:            0.180 m AV Peak Grad:      5.2 mmHg AV Mean Grad:      2.0 mmHg LVOT Vmax:         80.20 cm/s LVOT Vmean:        50.000 cm/s LVOT VTI:          0.141 m LVOT/AV VTI ratio: 0.78  AORTA Ao Root diam: 2.70 cm Ao Asc diam:  2.60 cm MITRAL VALVE MV Area (PHT): 5.50 cm     SHUNTS MV Area VTI:   1.68 cm     Systemic VTI:  0.14 m MV Peak grad:  7.4 mmHg     Systemic Diam: 1.70 cm MV Mean grad:  4.0 mmHg MV Vmax:       1.36 m/s MV Vmean:      95.5 cm/s MV Decel Time: 138 msec MV E velocity: 95.10 cm/s MV A velocity: 139.00 cm/s MV E/A ratio:  0.68 Gloriann Larger MD Electronically signed by Gloriann Larger MD Signature Date/Time: 11/08/2023/1:48:59 PM    Final    DG Chest Port 1 View Result Date: 11/06/2023 CLINICAL DATA:  Shortness of breath EXAM: PORTABLE CHEST 1 VIEW COMPARISON:  09/05/2022 FINDINGS: Chronic cardiopericardial enlargement. Prominent atheromatous calcification of the aorta. Indistinct density at the bases which is stable and likely chronic lung disease. No air bronchogram, Kerley lines, effusion, or pneumothorax. IMPRESSION: Cardiomegaly and chronic lung scarring. No acute finding when compared to prior. Electronically Signed   By: Ronnette Coke M.D.   On: 11/06/2023 08:46    Microbiology: Results for  orders placed or performed during the hospital encounter of 11/06/23  Resp panel by RT-PCR (RSV, Flu A&B, Covid) Anterior Nasal Swab     Status: None   Collection Time: 11/06/23  8:50 AM   Specimen: Anterior Nasal Swab  Result Value Ref Range Status   SARS Coronavirus 2 by RT PCR NEGATIVE NEGATIVE Final    Comment: (NOTE) SARS-CoV-2 target nucleic acids are NOT DETECTED.  The SARS-CoV-2 RNA is generally detectable in upper respiratory specimens during the acute phase of infection. The lowest concentration of SARS-CoV-2 viral copies this assay can detect is 138 copies/mL. A negative result does not preclude SARS-Cov-2 infection and should not be used as the sole basis for treatment or other patient management decisions. A negative result may occur with  improper specimen collection/handling, submission of specimen other than nasopharyngeal swab, presence of viral mutation(s) within the areas targeted by this assay, and inadequate number of viral copies(<138 copies/mL). A negative result must be combined with clinical observations, patient history, and epidemiological information. The expected result is Negative.  Fact Sheet for Patients:  BloggerCourse.com  Fact Sheet for Healthcare Providers:  SeriousBroker.it  This test is no t yet approved or cleared by the [place]  FDA and  has been authorized for detection and/or diagnosis of SARS-CoV-2 by FDA under an Emergency Use Authorization (EUA). This EUA will remain  in effect (meaning this test can be used) for the duration of the COVID-19 declaration under Section 564(b)(1) of the Act, 21 U.S.C.section 360bbb-3(b)(1), unless the authorization is terminated  or revoked sooner.       Influenza A by PCR NEGATIVE NEGATIVE Final   Influenza B by PCR NEGATIVE NEGATIVE Final    Comment: (NOTE) The Xpert Xpress SARS-CoV-2/FLU/RSV plus assay is intended as an aid in the diagnosis of  influenza from Nasopharyngeal swab specimens and  should not be used as a sole basis for treatment. Nasal washings and aspirates are unacceptable for Xpert Xpress SARS-CoV-2/FLU/RSV testing.  Fact Sheet for Patients: BloggerCourse.com  Fact Sheet for Healthcare Providers: SeriousBroker.it  This test is not yet approved or cleared by the [place]  FDA and has been authorized for detection and/or diagnosis of SARS-CoV-2 by FDA under an Emergency Use Authorization (EUA). This EUA will remain in effect (meaning this test can be used) for the duration of the COVID-19 declaration under Section 564(b)(1) of the Act, 21 U.S.C. section 360bbb-3(b)(1), unless the authorization is terminated or revoked.     Resp Syncytial Virus by PCR NEGATIVE NEGATIVE Final    Comment: (NOTE) Fact Sheet for Patients: BloggerCourse.com  Fact Sheet for Healthcare Providers: SeriousBroker.it  This test is not yet approved or cleared by the [place]  FDA and has been authorized for detection and/or diagnosis of SARS-CoV-2 by FDA under an Emergency Use Authorization (EUA). This EUA will remain in effect (meaning this test can be used) for the duration of the COVID-19 declaration under Section 564(b)(1) of the Act, 21 U.S.C. section 360bbb-3(b)(1), unless the authorization is terminated or revoked.  Performed at [facility], [address] [place], [place] [address]     Labs: CBC: Recent Labs  Lab 11/06/23 [phone] 11/07/23 0339 11/08/23 0442 11/09/23 0440  WBC 7.9 9.1 12.0* 11.4*  HGB 14.1 13.3 12.6 13.3  HCT 48.4* 47.3* 41.6 45.0  MCV 73.2* 77.2* 72.2* 73.6*  PLT 302 283 253 260   Basic Metabolic Panel: Recent Labs  Lab 11/06/23 0839 11/07/23 0339 11/08/23 0442 11/09/23 0440  NA 138 136 134* 135  K 4.3 4.2 4.5 4.6  CL 105 103 102 101  CO2 23 23 25 28   GLUCOSE  119* 127* 172* 127*  BUN 20 32* 44* 45*  CREATININE 0.90 1.52* 1.34* 1.25*  CALCIUM 10.0 9.6 9.2 9.4   Liver Function Tests: Recent Labs  Lab 11/06/23 0839  AST 24  ALT 20  ALKPHOS 117  BILITOT 0.6  PROT 8.9*  ALBUMIN 4.3   CBG: Recent Labs  Lab 11/08/23 2120 11/08/23 2342 11/09/23 0350 11/09/23 0734 11/09/23 1153  GLUCAP 195* 138* 127* 113* 147*    Discharge time spent: greater than 30 minutes.  Signed: Lorita Rosa, MD [facility] 11/09/2023

## 2023-11-09 NOTE — Plan of Care (Signed)

## 2023-11-09 NOTE — Progress Notes (Signed)
   11/08/23 2300  BiPAP/CPAP/SIPAP  BiPAP/CPAP/SIPAP Pt Type Adult  BiPAP/CPAP/SIPAP DREAMSTATIOND  Mask Type Nasal mask  Dentures removed? Not applicable  Mask Size Small  Patient Home Machine No  Patient Home Mask No  Patient Home Tubing No  Auto Titrate Yes  CPAP/SIPAP surface wiped down Yes  Device Plugged into RED Power Outlet Yes  BiPAP/CPAP /SiPAP Vitals  SpO2 94 %  Bilateral Breath Sounds Diminished

## 2023-11-27 ENCOUNTER — Other Ambulatory Visit: Payer: Self-pay | Admitting: Nurse Practitioner

## 2023-11-27 DIAGNOSIS — M47816 Spondylosis without myelopathy or radiculopathy, lumbar region: Secondary | ICD-10-CM

## 2023-12-06 ENCOUNTER — Ambulatory Visit
Admission: RE | Admit: 2023-12-06 | Discharge: 2023-12-06 | Disposition: A | Discharge status: Home / Self Care | Source: Ambulatory Visit | Attending: Nurse Practitioner | Admitting: Nurse Practitioner

## 2023-12-06 DIAGNOSIS — M47816 Spondylosis without myelopathy or radiculopathy, lumbar region: Secondary | ICD-10-CM

## 2023-12-07 ENCOUNTER — Other Ambulatory Visit

## 2024-01-14 ENCOUNTER — Ambulatory Visit (INDEPENDENT_AMBULATORY_CARE_PROVIDER_SITE_OTHER): Admitting: Nurse Practitioner

## 2024-01-14 ENCOUNTER — Encounter: Payer: Self-pay | Admitting: Nurse Practitioner

## 2024-01-14 VITALS — BP 108/69 | HR 74 | Ht 59.0 in | Wt 207.8 lb

## 2024-01-14 DIAGNOSIS — G4733 Obstructive sleep apnea (adult) (pediatric): Secondary | ICD-10-CM

## 2024-01-14 DIAGNOSIS — J45909 Unspecified asthma, uncomplicated: Secondary | ICD-10-CM | POA: Diagnosis not present

## 2024-01-14 DIAGNOSIS — J9611 Chronic respiratory failure with hypoxia: Secondary | ICD-10-CM

## 2024-01-14 DIAGNOSIS — D860 Sarcoidosis of lung: Secondary | ICD-10-CM | POA: Diagnosis not present

## 2024-01-14 NOTE — Assessment & Plan Note (Signed)
 Flare in April 2025 requiring hospitalization. Felt to be exacerbated by allergies. Clinically improved. Compensated on current regimen with Trelegy and PRN SABA. Asthma action plan in place.

## 2024-01-14 NOTE — Progress Notes (Signed)
 @Patient  ID: Cassandra Burns, female    DOB: 1965-09-14, 59 y.o.   MRN: [id number]  Chief Complaint  Patient presents with   Follow-up    Not using CPAP O2 2-3 l  [place] 21    Referring provider: Lorella Roles, MD  HPI: 58 year old female, former smoker followed for pulmonary sarcoidosis, OSA/OHS on BiPAP, asthma, chronic respiratory failure.  She is a former patient of Dr. Carlyle Childes and last seen 09/05/2022 by Cleo Dace NP.  Past medical history significant for hypertension, DM, depression, anxiety, morbid obesity, HLD.  She is followed by Dr. Henrine Logan with rheumatology for systemic sarcoidosis and was previously treated with chronic prednisone .  TEST/EVENTS:  May 2015 EBUS: Granulomas 08/2013 PFT: DLCO 60% 01/19/2020 HRCT chest: Calcified mediastinal and hilar nodes, patchy retraction BTX with interstitial coarsening and upper/mid lung predominance progress since 2018, air trapping 11/08/2019 PFT: FEV1 46, FVC 42, ratio 91, TLC 56, DLCOunc 55.  Severe restrictive lung disease with moderately severe diffusion defect.  No significant BD; did have mid flow reversibility 04/24/2021 echocardiogram: EF 65 to 70%, G1 DD, severe LVH 11/06/2023 CXR: cardiomegaly and chronic lung scarring  08/29/2021: OV with Dr. Matilde Son.  Recently started on prednisone  by rheumatology for joint pain/swelling.  Reported that she had stopped smoking marijuana and breathing improved after that.  No acute symptoms are reported.  Does not want to enroll in pulmonary rehab again.  Sleeping okay and using BiPAP 16/12 cmH2O nightly.  Uses 2 L supplemental O2 24/7  12/03/2021: OV with Aarion Metzgar NP for increased shortness of breath over the last few days.  Feels like her activity tolerance is not as great as it normally is.  She has had a minimal cough, which is dry and has not changed much from her baseline.  She did have some swelling in both of her lower extremities which has resolved after taking her as needed Lasix .  Denies any  wheezing, hemoptysis, recent weight loss, PND, orthopnea.  She has not had any increased O2 demand.  Remains on 2 L/min at rest and 3-4 L with activity, which she states is her normal for her.  She continues on Trelegy daily.  Has not been using albuterol  very much.  She continues on BiPAP nightly. She does need a 6 min walk per [employer] to replace her POC. Possible sarcoid flare vs asthma flare - treated with prednisone  taper.   06/27/2022: OV with Anh Mangano NP for surgical clearance prior to colonoscopy and endoscopy, scheduled for 07/30/2022. She's nervous about her procedure. She's had to be admitted in the past after colonoscopy due to difficulties waking her up afterwards. She doesn't think they put her on BiPAP during her procedures before but she's not entirely sure.  She has been doing well since we saw her last. Feels like after her last flare in May, her breathing has been pretty stable. She has not required any antibiotics or steroids. She still gets short winded with longer distances, but otherwise, can do things around the house without much trouble. She denies any cough, chest congestion, skin lesions, vision changes. She sleeps with her BiPAP every night. Puts it on the second she gets in the bed and falls asleep within 20 minutes. No increased oxygen  requirement; usually on 2 lpm at rest and mild activity. Will increase to 3-4 with strenuous exertion. She is participating in pulmonary rehab.   09/05/2022: OV with Sumner Boesch NP for follow up and to renew her oxygen  qualification, per request of her DME. She has  been doing well since she was here last. Her breathing has been stable. No flares since last May. No increased cough or chest congestion. She did postpone her EGD and colonoscopy so she will now be having these the end of the month. She wears her oxygen  24/7. Wears her BiPAP every night and receives good benefit from use. No concerns or complaints today.   01/14/2024: Today - follow up Discussed the  use of AI scribe software for clinical note transcription with the patient, who gave verbal consent to proceed.  History of Present Illness   Cassandra Burns is a 58 year old female who presents for follow up. She is accompanied by her godson.  She experienced an exacerbation of her COPD symptoms in April and was hospitalized, which she attributes to high pollen levels, leading to increased use of her nebulizer. She's feeling back to her baseline since then. Feels like she is doing well overall. She continues to use Trelegy for COPD management. Still having to use her nebulizer some; needs a new one. Occasional cough, baseline. No fevers, hemoptysis, leg swelling.   She has a history of neuropathy and recently experienced worsening. She noted improvement with prednisone , which she describes as 'the only thing that saved me'. She's doing better today. She also discontinued amlodipine  due to suspected side effects but has not informed her primary care provider.  She has a history of sleep apnea and has been off her BiPAP machine for some time, as her previous machine was very old. She is not using it currently. Does feel more tired off of it. Does not drive. No sleep parasomnias.  She is using her oxygen . No increased needs.       Allergies  Allergen Reactions   Oxycontin  [Oxycodone ] Other (See Comments)    Too sleepy; had to give me something to knock it out last time I was in hospital    Immunization History  Administered Date(s) Administered   Influenza Split 08/01/2008, 04/03/2011, 07/07/2012, 08/29/2012, 07/20/2014   Influenza,inj,Quad PF,6+ Mos 08/05/2018   Influenza-Unspecified 05/29/2017   PFIZER(Purple Top)SARS-COV-2 Vaccination 04/29/2020, 05/30/2020   Pneumococcal Conjugate-13 12/06/2010   Tdap 12/06/2010    Past Medical History:  Diagnosis Date   Anxiety    Asthma    Bone pain 06/07/2013   Depression    Diabetes mellitus 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/2012   NIDDM   Hernia of abdominal  cavity 10/25/2013   History of anemia 07/26/2013   Hypertension    Iron deficiency anemia due to chronic blood loss    Lymphadenopathy 06/07/2013   Obesity 12/18/2012   Psoriasis    Sarcoidosis 07/26/2013   Sleep apnea    uses bipap    Tobacco History: Social History   Tobacco Use  Smoking Status Former   Types: Cigars   Quit date: 06/30/2017   Years since quitting: 6.5  Smokeless Tobacco Never  Tobacco Comments   Smokes Marijuana (here and there) (as needed)   Counseling given: Not Answered Tobacco comments: Smokes Marijuana (here and there) (as needed)   Outpatient Medications Prior to Visit  Medication Sig Dispense Refill   albuterol  (PROVENTIL ) (2.5 MG/3ML) 0.083% nebulizer solution Take 3 mLs (2.5 mg total) by nebulization every 6 (six) hours as needed for wheezing or shortness of breath. 75 mL 12   albuterol  (VENTOLIN  HFA) 108 (90 Base) MCG/ACT inhaler Inhale 1-2 puffs into the lungs every 6 (six) hours as needed for wheezing or shortness of breath. 18 g 6   allopurinol  (ZYLOPRIM ) 100  MG tablet Take 100 mg by mouth in the morning.     amLODipine  (NORVASC ) 10 MG tablet Take 1 tablet (10 mg total) by mouth daily after lunch. (Patient taking differently: Take 10 mg by mouth in the morning.) 30 tablet 3   DULoxetine  (CYMBALTA ) 60 MG capsule Take 1 capsule (60 mg total) by mouth daily. 30 capsule 11   FEROSUL 325 (65 Fe) MG tablet Take 325 mg by mouth daily.     Fluticasone -Umeclidin-Vilant (TRELEGY ELLIPTA) 200-62.5-25 MCG/ACT AEPB Inhale 1 puff into the lungs daily.     gabapentin  (NEURONTIN ) 800 MG tablet Take 800 mg by mouth 3 (three) times daily.     labetalol  (NORMODYNE ) 100 MG tablet Take 100 mg by mouth in the morning.     losartan -hydrochlorothiazide  (HYZAAR) 100-25 MG tablet Take 1 tablet by mouth in the morning.     MOUNJARO 15 MG/0.5ML Pen Inject 15 mg into the skin once a week. Tuesdays     OXYGEN  Inhale 2-3 L into the lungs continuous.     rosuvastatin   (CRESTOR ) 10 MG tablet Take 10 mg by mouth daily.     sodium chloride  (OCEAN) 0.65 % SOLN nasal spray Place 1 spray into both nostrils as needed for congestion. 60 mL 0   SYNJARDY XR 25-1000 MG TB24 Take 1 tablet by mouth in the morning.     TOUJEO  SOLOSTAR 300 UNIT/ML Solostar Pen Inject 40 Units into the skin 2 (two) times daily after a meal. (Patient taking differently: Inject 68 Units into the skin 2 (two) times daily after a meal.)     No facility-administered medications prior to visit.     Review of Systems:   Constitutional: No weight loss or gain, night sweats, fevers, chills, or lassitude. +occasional fatigue  HEENT: No headaches, difficulty swallowing, tooth/dental problems, or sore throat. No sneezing, itching, ear ache, nasal congestion, or post nasal drip CV:  No chest pain, orthopnea, PND, swelling in lower extremities, anasarca, dizziness, palpitations, syncope Resp: +shortness of breath with exertion (baseline); baseline cough. No excess mucus or change in color of mucus. No hemoptysis. No wheezing.  No chest wall deformity GI:  No heartburn, indigestion Skin: No rash, lesions, ulcerations Neuro: No dizziness or lightheadedness.  Psych: No depression or anxiety. Mood stable.     Physical Exam:  BP 108/69 (BP Location: Right Arm, Cuff Size: Normal)   Pulse 74   Ht 4' 11 (1.499 m)   Wt 207 lb 12.8 oz (94.3 kg)   LMP 06/13/2012   SpO2 94%   BMI 41.97 kg/m   GEN: Pleasant, interactive, chronically-ill appearing; obese; in no acute distress. HEENT:  Normocephalic and atraumatic. PERRLA. Sclera white. Nasal turbinates pink, moist and patent bilaterally. No rhinorrhea present. Oropharynx pink and moist, without exudate or edema. No lesions, ulcerations, or postnasal drip.  NECK:  Supple w/ fair ROM. No JVD present. Normal carotid impulses w/o bruits. Thyroid  symmetrical with no goiter or nodules palpated. No lymphadenopathy.   CV: RRR, no m/r/g, no peripheral edema.  Pulses intact, +2 bilaterally. No cyanosis, pallor or clubbing. PULMONARY:  Unlabored, regular breathing. Diminished bilaterally w/o wheezes/rales/rhonchi. No accessory muscle use. No dullness to percussion. GI: BS present and normoactive. Soft, non-tender to palpation. No organomegaly or masses detected.  MSK: No erythema, warmth or tenderness. Cap refil <2 sec all extrem. No deformities or joint swelling noted.  Neuro: A/Ox3. No focal deficits noted.   Skin: Warm, no lesions or rashe Psych: Normal affect and behavior. Judgement and  thought content appropriate.     Lab Results:  CBC    Component Value Date/Time   WBC 11.4 (H) 11/09/2023 0440   RBC 6.11 (H) 11/09/2023 0440   HGB 13.3 11/09/2023 0440   HGB 11.7 08/08/2017 1001   HGB 9.7 (L) 02/05/2017 1104   HCT 45.0 11/09/2023 0440   HCT 31.7 (L) 02/05/2017 1104   PLT 260 11/09/2023 0440   PLT 189 08/08/2017 1001   PLT 236 02/05/2017 1104   MCV 73.6 (L) 11/09/2023 0440   MCV 69.2 (L) 02/05/2017 1104   MCH 21.8 (L) 11/09/2023 0440   MCHC 29.6 (L) 11/09/2023 0440   RDW 21.7 (H) 11/09/2023 0440   RDW 20.0 (H) 02/05/2017 1104   LYMPHSABS 1.7 11/14/2022 1130   LYMPHSABS 0.9 02/05/2017 1104   MONOABS 0.5 11/14/2022 1130   MONOABS 0.5 02/05/2017 1104   EOSABS 0.2 11/14/2022 1130   EOSABS 0.1 02/05/2017 1104   BASOSABS 0.0 11/14/2022 1130   BASOSABS 0.0 02/05/2017 1104    BMET    Component Value Date/Time   NA 135 11/09/2023 0440   NA 145 (H) 07/21/2023 1052   NA 137 06/07/2013 0917   K 4.6 11/09/2023 0440   K 4.1 06/07/2013 0917   CL 101 11/09/2023 0440   CL 102 09/09/2012 1010   CO2 28 11/09/2023 0440   CO2 23 06/07/2013 0917   GLUCOSE 127 (H) 11/09/2023 0440   GLUCOSE 183 (H) 06/07/2013 0917   GLUCOSE 237 (H) 09/09/2012 1010   BUN 45 (H) 11/09/2023 0440   BUN 22 07/21/2023 1052   BUN 12.0 06/07/2013 0917   CREATININE 1.25 (H) 11/09/2023 0440   CREATININE 1.53 (H) 04/18/2022 1440   CREATININE 0.8 06/07/2013 0917    CALCIUM  9.4 11/09/2023 0440   CALCIUM  10.4 06/07/2013 0917   GFRNONAA 50 (L) 11/09/2023 0440   GFRNONAA 67 09/07/2020 0000   GFRAA 77 09/07/2020 0000    BNP    Component Value Date/Time   BNP 56.8 11/06/2023 0839     Imaging:  No results found.  Administration History     None          Latest Ref Rng & Units 11/08/2019   10:11 AM 05/19/2017   10:35 AM 09/20/2014    1:54 PM  PFT Results  FVC-Pre L 1.00  1.33  1.52   FVC-Predicted Pre % 42  55  61   FVC-Post L 0.87  1.26  1.52   FVC-Predicted Post % 36  52  61   Pre FEV1/FVC % % 89  90  83   Post FEV1/FCV % % 91  93  85   FEV1-Pre L 0.88  1.19  1.26   FEV1-Predicted Pre % 46  61  63   FEV1-Post L 0.80  1.17  1.28   DLCO uncorrected ml/min/mmHg 9.95  11.09  11.40   DLCO UNC% % 55  58  60   DLCO corrected ml/min/mmHg  10.99    DLCO COR %Predicted %  58    DLVA Predicted % 126  113  101   TLC L 2.50  2.52  3.62   TLC % Predicted % 56  56  81   RV % Predicted % 79  72  113     No results found for: NITRICOXIDE      Assessment & Plan:   Pulmonary sarcoidosis (HCC) Diagnosed via EBUS 2015. Clinically stable. Not currently on long term steroids or immunosuppression therapy. No systemic symptoms.  Patient Instructions  Continue Trelegy 1 puff daily. Brush tongue and rinse mouth afterwards Continue Albuterol  inhaler 2 puffs or 3 mL neb every 6 hours as needed for shortness of breath or wheezing. Notify if symptoms persist despite rescue inhaler/neb use. Continue flonase  1 spray each nostril daily as needed for nasal congestion/drainage Continue supplemental oxygen  2-4 lpm for goal >88-90%    Since you've been off of BiPAP therapy at night, you will need a sleep study for further evaluation. Someone will contact you to schedule this.   We discussed how untreated sleep apnea puts an individual at risk for cardiac arrhthymias, pulm HTN, DM, stroke and increases their risk for daytime accidents. We also  briefly reviewed treatment options including weight loss, side sleeping position, oral appliance, CPAP therapy or referral to ENT for possible surgical options  Use caution when driving and pull over if you become sleepy.  Order for new nebulizer placed   Follow up in 12 weeks with Dr. Gaynell Keeler (new pt 30 min slot) to establish care. If symptoms do not improve or worsen, please contact office for sooner follow up or seek emergency care.   Asthma Flare in April 2025 requiring hospitalization. Felt to be exacerbated by allergies. Clinically improved. Compensated on current regimen with Trelegy and PRN SABA. Asthma action plan in place.   Chronic respiratory failure with hypoxia (HCC) She will continue her oxygen  therapy 2-4 lpm to maintain saturations >88-90%.   OSA (obstructive sleep apnea) Off BiPAP due to age. Will need updated sleep study given timeframe. HST with Watch[employer] ordered due to O2 use. Reviewed risks of untreated OSA. She is willing to resume PAP therapy if appropriate. Healthy weight loss encouraged. Safe driving practices reviewed.       I spent 32 minutes of dedicated to the care of this patient on the date of this encounter to include pre-visit review of records, face-to-face time with the patient discussing conditions above, post visit ordering of testing, clinical documentation with the electronic health record, making appropriate referrals as documented, and communicating necessary findings to members of the patients care team.  Roetta Clarke, NP 01/14/2024  Pt aware and understands NP's role.

## 2024-01-14 NOTE — Patient Instructions (Addendum)
 Continue Trelegy 1 puff daily. Brush tongue and rinse mouth afterwards Continue Albuterol  inhaler 2 puffs or 3 mL neb every 6 hours as needed for shortness of breath or wheezing. Notify if symptoms persist despite rescue inhaler/neb use. Continue flonase  1 spray each nostril daily as needed for nasal congestion/drainage Continue supplemental oxygen  2-4 lpm for goal >88-90%    Since you've been off of BiPAP therapy at night, you will need a sleep study for further evaluation. Someone will contact you to schedule this.   We discussed how untreated sleep apnea puts an individual at risk for cardiac arrhthymias, pulm HTN, DM, stroke and increases their risk for daytime accidents. We also briefly reviewed treatment options including weight loss, side sleeping position, oral appliance, CPAP therapy or referral to ENT for possible surgical options  Use caution when driving and pull over if you become sleepy.  Order for new nebulizer placed   Follow up in 12 weeks with Dr. Gaynell Keeler (new pt 30 min slot) to establish care. If symptoms do not improve or worsen, please contact office for sooner follow up or seek emergency care.

## 2024-01-14 NOTE — Assessment & Plan Note (Signed)
 She will continue her oxygen  therapy 2-4 lpm to maintain saturations >88-90%.

## 2024-01-14 NOTE — Assessment & Plan Note (Signed)
 Off BiPAP due to age. Will need updated sleep study given timeframe. HST with WatchPat ordered due to O2 use. Reviewed risks of untreated OSA. She is willing to resume PAP therapy if appropriate. Healthy weight loss encouraged. Safe driving practices reviewed.

## 2024-01-14 NOTE — Assessment & Plan Note (Signed)
 Diagnosed via EBUS 2015. Clinically stable. Not currently on long term steroids or immunosuppression therapy. No systemic symptoms.   Patient Instructions  Continue Trelegy 1 puff daily. Brush tongue and rinse mouth afterwards Continue Albuterol  inhaler 2 puffs or 3 mL neb every 6 hours as needed for shortness of breath or wheezing. Notify if symptoms persist despite rescue inhaler/neb use. Continue flonase  1 spray each nostril daily as needed for nasal congestion/drainage Continue supplemental oxygen  2-4 lpm for goal >88-90%    Since you've been off of BiPAP therapy at night, you will need a sleep study for further evaluation. Someone will contact you to schedule this.   We discussed how untreated sleep apnea puts an individual at risk for cardiac arrhthymias, pulm HTN, DM, stroke and increases their risk for daytime accidents. We also briefly reviewed treatment options including weight loss, side sleeping position, oral appliance, CPAP therapy or referral to ENT for possible surgical options  Use caution when driving and pull over if you become sleepy.  Order for new nebulizer placed   Follow up in 12 weeks with Dr. Gaynell Keeler (new pt 30 min slot) to establish care. If symptoms do not improve or worsen, please contact office for sooner follow up or seek emergency care.

## 2024-01-20 ENCOUNTER — Other Ambulatory Visit: Payer: Self-pay | Admitting: Family Medicine

## 2024-01-20 DIAGNOSIS — Z1231 Encounter for screening mammogram for malignant neoplasm of breast: Secondary | ICD-10-CM

## 2024-01-21 ENCOUNTER — Ambulatory Visit: Payer: Self-pay | Admitting: Nurse Practitioner

## 2024-01-21 NOTE — Telephone Encounter (Signed)
 FYI Only or Action Required?: Action required by provider: requesting appt, advised hospital, pt did not confirm or deny intent to go to ED.   Patient is followed in Pulmonology for COPD, OSA, pulmonary sarcoidosis, last seen on 01/14/2024 by Cassandra Comer GAILS, NP. Called Nurse Triage reporting Shortness of Breath, poor sleep, Cough, Fatigue, Dizziness, Diplopia, and black/brown and dark yellow thick mucus. Symptoms began a week ago. Interventions attempted: Prescription medications: prednisone  and doxycyline prescribed yesterday, Maintenance inhaler, Nebulizer treatments, and Home oxygen  use. Symptoms are: rapidly worsening.  Triage Disposition: Call EMS 911 Now - Advised hospital especially if worsening  Patient/caregiver understands and will follow disposition?: No, refuses disposition       Copied from CRM [id number]. Topic: Clinical - Red Word Triage >> Jan 21, 2024  3:21 PM Cassandra Burns wrote: Red Word that prompted transfer to Nurse Triage: Patient [phone] states has not heard anyone to schedule at sleep study. Patient states symptoms has worsen, shortness of breath, coughing a lot of phlegm choking patient, days are mixed up, is up all night, walking unbalance, vision issues (patient wears glasses), fatigue and sleepy during the day. Patient states does not want to drive due to being so tired. Patient denies mental confusion. Patient saw pcp yesterday because of coughing a lot of phlegm choking patient and shortness of breath, pcp prescribed antibiotics. Patient has COPD, asthma and is a diabetic. Please advise. Reason for Disposition  [1] Chest pain lasts > 5 minutes AND [2] age > 97  Answer Assessment - Initial Assessment Questions E2C2 Pulmonary Triage - Initial Assessment Questions Chief Complaint (e.g., cough, sob, wheezing, fever, chills, sweat or additional symptoms) *Go to specific symptom protocol after initial questions. Off of my old machine because it burns me in the nose  because nose probes, so was sleeping without it, kept forgetting to call in and tell you guys that need new machine, think went too far, feel like back to beginning, up at night and sleeping during the day, right now so sleepy, haven't slept at all, get an hour here, doze off wake up, so tired, normally during the day, go to bed 1 pm then wake up 5-6 pm Normally put on machine and puts me to sleep Up all night, so tired COPD flare up with all the pollen Just was with you guys last week, think a week ago started randomly spitting then it became thick like real thick with color in the mucus, little bit of black, real dark yellow color, thick, some brown, no blood, to get it out sometimes it chokes me Just saw PCP yesterday and they put me on prednisone  and doxycycline  Vision changes, sometimes get double vision, just feels thicker, bleeding mucus out of my eyes real bad, yellow drainage from eyes, PCP not doing anything for that, eyes crusted over, antibx for eyes Coughing up phlegm on phone With all my other symptoms, sometimes walking sideways, not all the time, never fall Yesterday had couple chest pains that come and go, like try to make yourself burp and relieve the pressure, seeing if would help with pressure in chest 9x/10 doing that enough relieves it a little Chest pressure that comes and goes, a little over 5 min because have to work it out 8/10 discomfort, have heart issues  Have you used your inhalers/maintenance medication? Yes If yes, What medications? Trelegy Rescue inhaler Just got new nebulizer  If inhaler, ask How many puffs and how often? Note: Review instructions on medication in the  chart. Rescue inhaler when needed, try not to go outside in the pollen, so when out of breath or over-exerted at home, sit and do breathing exercises used the actual nebulizer over the past few days 1x/day - can breathe but not really improvement  OXYGEN : Do you wear supplemental oxygen ?  Yes If yes, How many liters are you supposed to use? 2L 24/7  Do you monitor your oxygen  levels? Yes If yes, What is your reading (oxygen  level) today? No reading for today  6. CARDIAC HISTORY: Do you have any history of heart disease? (e.g., heart attack, angina, bypass surgery, angioplasty)      Significant  7. LUNG HISTORY: Do you have any history of lung disease?  (e.g., pulmonary embolus, asthma, emphysema)     Significant   Advised pt be examined in hospital asap since having SOB not improved by nebulizer, chest pressure >5 min, dizziness, and double vision. Pt verbalized understanding but did not confirm or deny intent to go to hospital, advised hospital if any worsening of symptoms. Advised pt use rescue inhaler. Sending message to pulm for call back with further recommendations and to schedule sleep study as ordered.  Protocols used: Breathing Difficulty-A-AH, Chest Pain-A-AH

## 2024-01-22 NOTE — Telephone Encounter (Signed)
 Dr. Theophilus, Patient is refusing recommendations. Please advise.  Thank you.

## 2024-01-26 NOTE — Telephone Encounter (Signed)
 Spoke to patient and relayed below message/recommendations. She stated that her breathing has improved.  Nothing further needed at this time.

## 2024-01-26 NOTE — Telephone Encounter (Signed)
 If the patient was just started on prednisone  and doxycycline  I would give it a few days for it to improve and would not add anything else.  Please call back or go to the ED if symptoms are worsening

## 2024-02-04 ENCOUNTER — Ambulatory Visit
Admission: RE | Admit: 2024-02-04 | Discharge: 2024-02-04 | Disposition: A | Discharge status: Home / Self Care | Source: Ambulatory Visit | Attending: Family Medicine

## 2024-02-04 DIAGNOSIS — Z1231 Encounter for screening mammogram for malignant neoplasm of breast: Secondary | ICD-10-CM

## 2024-02-10 ENCOUNTER — Other Ambulatory Visit: Payer: Self-pay | Admitting: Family Medicine

## 2024-02-10 DIAGNOSIS — R928 Other abnormal and inconclusive findings on diagnostic imaging of breast: Secondary | ICD-10-CM

## 2024-02-23 ENCOUNTER — Ambulatory Visit

## 2024-02-23 ENCOUNTER — Encounter

## 2024-02-23 DIAGNOSIS — J9611 Chronic respiratory failure with hypoxia: Secondary | ICD-10-CM

## 2024-02-23 DIAGNOSIS — G4733 Obstructive sleep apnea (adult) (pediatric): Secondary | ICD-10-CM

## 2024-02-24 ENCOUNTER — Other Ambulatory Visit

## 2024-02-24 ENCOUNTER — Encounter

## 2024-02-25 ENCOUNTER — Inpatient Hospital Stay
Admission: RE | Admit: 2024-02-25 | Discharge: 2024-02-25 | Discharge status: Home / Self Care | Source: Ambulatory Visit | Attending: Family Medicine

## 2024-02-25 ENCOUNTER — Ambulatory Visit
Admission: RE | Admit: 2024-02-25 | Discharge: 2024-02-25 | Disposition: A | Discharge status: Home / Self Care | Source: Ambulatory Visit | Attending: Family Medicine | Admitting: Family Medicine

## 2024-02-25 ENCOUNTER — Other Ambulatory Visit: Payer: Self-pay | Admitting: Family Medicine

## 2024-02-25 DIAGNOSIS — R928 Other abnormal and inconclusive findings on diagnostic imaging of breast: Secondary | ICD-10-CM

## 2024-02-25 DIAGNOSIS — N6310 Unspecified lump in the right breast, unspecified quadrant: Secondary | ICD-10-CM

## 2024-02-25 DIAGNOSIS — N6489 Other specified disorders of breast: Secondary | ICD-10-CM

## 2024-03-03 DIAGNOSIS — G4733 Obstructive sleep apnea (adult) (pediatric): Secondary | ICD-10-CM | POA: Diagnosis not present

## 2024-03-04 ENCOUNTER — Telehealth: Payer: Self-pay | Admitting: *Deleted

## 2024-03-04 NOTE — Telephone Encounter (Signed)
 Returned call to patient and notified result are not available as of yet. NFN

## 2024-03-15 ENCOUNTER — Telehealth: Payer: Self-pay | Admitting: Student

## 2024-03-15 NOTE — Telephone Encounter (Signed)
 It was a [employer] and Dr. Neysa read on 03/03/24 not sure where the signed copy is

## 2024-03-15 NOTE — Telephone Encounter (Signed)
 PCC's- can you please advise on the sleep study? Is this ready for the provider to review? I do not see anything under procedures besides the HST order. Thanks!

## 2024-03-15 NOTE — Telephone Encounter (Signed)
 Copied from CRM #[id number]. Topic: Clinical - Lab/Test Results >> Mar 15, 2024 10:52 AM Cassandra Burns wrote: Reason for CRM: Patient called in regarding sleep study results. Patient stated she had the sleep study done on July 28th.

## 2024-03-16 NOTE — Telephone Encounter (Signed)
 Patient Home Sleep Test has been uploaded. NFN

## 2024-03-17 ENCOUNTER — Ambulatory Visit: Payer: Self-pay | Admitting: *Deleted

## 2024-03-17 DIAGNOSIS — G4733 Obstructive sleep apnea (adult) (pediatric): Secondary | ICD-10-CM

## 2024-03-17 NOTE — Addendum Note (Signed)
 Addended byBETHA FRIES, Baelynn Schmuhl A on: 03/17/2024 02:33 PM   Modules accepted: Orders

## 2024-03-17 NOTE — Telephone Encounter (Signed)
 FYI Only or Action Required?: Action required by provider: Needs call back asap about obtaining new machine and go over sleep study results.  Patient is followed in Pulmonology for COPD, OSA, pulmonary sarcoidosis, and asthma, last seen on 01/14/2024 by Malachy Comer GAILS, NP.  Called Nurse Triage reporting needs new machine - asking for status on this, interrupted sleep, and questions about sleep study results.  Symptoms began ongoing issue, denies new or acutely worsening symptoms.  Interventions attempted: Other: old machine that she needed to discontinue months ago, been attempting to obtain new machine.  Symptoms are: gradually worsening.  Triage Disposition: Call PCP When Office is Open  Patient/caregiver understands and will follow disposition?: Yes     Reason for Disposition  [1] Caller requesting NON-URGENT health information AND [2] PCP's office is the best resource  Answer Assessment - Initial Assessment Questions 1. REASON FOR CALL: What is the main reason for your call? or How can I best help you?     Nothing new but without my machine suffering more than I need to, days and nights are mixed up, normally comfortable at night now sleep schedule messed up Stopped using other machine because had been making nostrils burn, burning be unreal, 58 years old No machine right now, not had it in months, gotten worse Sometimes wake up clammy sweaty, sometimes because suffer from COPD and CHF sometimes scared at night to sleep, gotta make sure I'm sitting high up enough, then move positions in sleep, when on machine then in one position, otherwise restless and moving all around  3. OTHER QUESTIONS: Do you have any other questions?     Just want to know what's going on Need new machine Need someone to call her back and go over results of sleep study    Sending message to pulm office for call back asap about machine and results  Protocols used: Information Only Call - No  Triage-A-AH

## 2024-03-17 NOTE — Telephone Encounter (Signed)
 Message from Pickens J sent at 03/17/2024 11:43 AM EDT  Summary: Excessive daytime sleepiness   Pt originally contacted clinic to request update on sleep study results. Reports she is unable to get restorative sleep at night and is up several times throughout the night Is falling asleep during the day several hours at a time Friends have reported her snoring while talking to them on the phone. Is very frustrated and is struggling to perform daily activities  Was using BiPAP machine; however due to the age of machine, was causing a burning sensation in her nostrils that woke her up during the night. Sleep study was to establish a current baseline, from review of chart, provider has reviewed results but pt has not been contacted to review results and set up with new machine.  CB#  [phone]          Call History  Contact Date/Time Type Contact Phone/Fax By  03/17/2024 11:22 AM EDT Phone (Incoming) Cassandra Burns, Cassandra Burns (Self) [phone] [place], Alisha S

## 2024-03-17 NOTE — Telephone Encounter (Signed)
 Severe OSA on WatchPat study scanned in 8/19. Previously on BiPAP therapy but has been off due to age of machine. Needs to resume BiPAP therapy nightly. Educated on proper care/use of device. Please place urgent order for BiPAP 16/12 with 2 lpm bled through. Prior titration is in chart. Thanks.

## 2024-03-17 NOTE — Telephone Encounter (Signed)
 This was put into nurse triage queue.   Forwarded it to the pulmonary triage nurse.

## 2024-03-17 NOTE — Telephone Encounter (Signed)
 Called and spoke with the patient. Relayed Katie's message to the pt. Pt is aware HST results & urgent new BiPAP machine order has been placed. Pt verbalized understanding. Nfn

## 2024-04-05 ENCOUNTER — Telehealth: Payer: Self-pay | Admitting: Neurology

## 2024-04-05 NOTE — Telephone Encounter (Signed)
 Patient rescheduled appointment due to double booking appointments.

## 2024-04-06 ENCOUNTER — Ambulatory Visit: Payer: Self-pay | Admitting: Family Medicine

## 2024-04-06 NOTE — Telephone Encounter (Signed)
 Routing to [employer] (back in clinic tomorrow)   It looks like her HST has not been resulted yet, and she is needing referral for BIPAP machine  Katie- can you please advise on this?   It looks like she still has not set up ov with Dr Neda as you requested. I tried calling her and there was no answer- line rang and then changed to busy tone

## 2024-04-06 NOTE — Telephone Encounter (Signed)
 FYI Only or Action Required?: Action required by provider: patient needs biPAP referral faxed again.  Patient is followed in Pulmonology for OSA, last seen on 01/14/2024 by Malachy Comer GAILS, NP.  Called Nurse Triage reporting Advice Only.  Triage Disposition: Call PCP When Office is Open  Patient/caregiver understands and will follow disposition?: Yes         Copied from CRM [id number]. Topic: Clinical - Lab/Test Results >> Apr 06, 2024 10:52 AM Nathanel DEL wrote: Reason for CRM: pt has another number to fax for her new bi pap [phone] Also does pt need appt?  If so ok to schedule w/ someome else? Reason for Disposition  [1] Caller requesting NON-URGENT health information AND [2] PCP's office is the best resource  Answer Assessment - Initial Assessment Questions Pt had a referral placed on 8/20 for a bipap machine and it states PT WILL NEED APPOINTMENT IN OFFICE WITH DME FOR BIPAP SET UP.   Pt states she reached out to [facility] about 1 week after the referral (8/20) and was told it would take 1-2 weeks for processing after they received the prescription for the biPAP machine. Pt called [facility] this morning and was told they have not received the referral for biPAP at this time.  This RN attempted to contact [facility] twice with no answer. This RN will send a high priority message to clinic for this referral to be faxed again today. Per pt, [facility] said to fax the referral to [phone] and mark as URGENT.  Pt states her sleep is getting worse as she is not having a complete sleep.  Pt states she has home oxygen  and a concentrator, 92% O2 saturation right now which pt states is normal for her  Pt requests a call back for peace of mind once this referral is faxed again today. Pt call back number is [phone].  Protocols used: Information Only Call - No Triage-A-AH

## 2024-04-07 ENCOUNTER — Ambulatory Visit: Admitting: Adult Health

## 2024-04-07 ENCOUNTER — Other Ambulatory Visit: Payer: Self-pay

## 2024-04-07 DIAGNOSIS — G4733 Obstructive sleep apnea (adult) (pediatric): Secondary | ICD-10-CM

## 2024-04-07 NOTE — Telephone Encounter (Signed)
 HST is in chart. Severe OSA on WatchPat study scanned in 8/19. Previously on BiPAP therapy but has been off due to age of machine. I sent this message back on 8/20. Please place urgent order for BiPAP 16/12 with 2 lpm bled through. Prior titration is in chart. Thanks.  Can we follow up with [facility] on this? Thanks.

## 2024-04-07 NOTE — Telephone Encounter (Signed)
 Order was placed on 03/17/2024, however it was placed within an E2C2 encounter, therefore the order did not flow over to PCC's box. I have re ordered as urgent. Patient is aware and voiced her understanding.   PCC's, please send to [facility] urgently.

## 2024-04-07 NOTE — Telephone Encounter (Signed)
 Order is signed.

## 2024-04-07 NOTE — Telephone Encounter (Signed)
 Order has been sent. NFN

## 2024-04-07 NOTE — Telephone Encounter (Signed)
 Will send as soon as order is signed

## 2024-04-22 ENCOUNTER — Telehealth: Payer: Self-pay | Admitting: Nurse Practitioner

## 2024-04-22 NOTE — Telephone Encounter (Signed)
 Copied from CRM #[id number]. Topic: Clinical - Order For Equipment >> Apr 09, 2024  1:32 PM Russell PARAS wrote: Reason for CRM:   Pt is contacting clinic to speak with [facility] regarding her BiPAP. Reviewed chart and advised the order was sent and verified receipt on 9/10 by [facility]. Advised to contact [facility] to schedule delivery of machine. NFN >> Apr 22, 2024 12:55 PM Russell PARAS wrote: Pt was advised by [facility] she would need to pay $160 to receive her new BiPAP. She is not able to afford the copay and is wondering if there any assistance programs through [facility] or other companies to assist with the copay.   Requested call back, she has been dealing with the process of getting a new BiPAP for an extended period of time and is very frustrated.  CB#  [phone]

## 2024-04-23 NOTE — Telephone Encounter (Signed)
 Cassandra Burns is there anything to help pay for the cost of BiPAP machine? Would the patient need to contact [facility] regarding pricing issues?

## 2024-04-23 NOTE — Telephone Encounter (Signed)
 Called and spoke with the patient regarding Cassandra Burns's note. Pt verbalized understanding and will look into recommendations.

## 2024-04-23 NOTE — Telephone Encounter (Signed)
 She should start with [facility] and see if they have a rental program that is more affordable or a payment plan for that initial payment. I think the only assistance program that is less than $100 is the [facility], which she can look into. http://[email or web address].org/  The other two organizations are typically started at $100-200

## 2024-06-07 ENCOUNTER — Telehealth: Payer: Self-pay | Admitting: Neurology

## 2024-06-07 NOTE — Telephone Encounter (Signed)
 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 Will call back to reschedule.

## 2024-06-08 ENCOUNTER — Ambulatory Visit: Admitting: Adult Health

## 2024-08-18 ENCOUNTER — Emergency Department (HOSPITAL_COMMUNITY)

## 2024-08-18 ENCOUNTER — Emergency Department (HOSPITAL_COMMUNITY): Admission: EM | Admit: 2024-08-18 | Discharge: 2024-08-18 | Disposition: A | Discharge status: Home / Self Care

## 2024-08-18 DIAGNOSIS — M25551 Pain in right hip: Secondary | ICD-10-CM | POA: Diagnosis not present

## 2024-08-18 DIAGNOSIS — R112 Nausea with vomiting, unspecified: Secondary | ICD-10-CM | POA: Insufficient documentation

## 2024-08-18 DIAGNOSIS — Y92410 Unspecified street and highway as the place of occurrence of the external cause: Secondary | ICD-10-CM | POA: Diagnosis not present

## 2024-08-18 DIAGNOSIS — R1031 Right lower quadrant pain: Secondary | ICD-10-CM | POA: Diagnosis not present

## 2024-08-18 DIAGNOSIS — M545 Low back pain, unspecified: Secondary | ICD-10-CM | POA: Insufficient documentation

## 2024-08-18 DIAGNOSIS — S20212A Contusion of left front wall of thorax, initial encounter: Secondary | ICD-10-CM | POA: Insufficient documentation

## 2024-08-18 DIAGNOSIS — M7918 Myalgia, other site: Secondary | ICD-10-CM

## 2024-08-18 LAB — CBC WITH DIFFERENTIAL/PLATELET
Abs Immature Granulocytes: 0.03 K/uL (ref 0.00–0.07)
Basophils Absolute: 0 K/uL (ref 0.0–0.1)
Basophils Relative: 0 %
Eosinophils Absolute: 0.2 K/uL (ref 0.0–0.5)
Eosinophils Relative: 2 %
HCT: 47.4 % — ABNORMAL HIGH (ref 36.0–46.0)
Hemoglobin: 13.7 g/dL (ref 12.0–15.0)
Immature Granulocytes: 0 %
Lymphocytes Relative: 21 %
Lymphs Abs: 2 K/uL (ref 0.7–4.0)
MCH: 21.5 pg — ABNORMAL LOW (ref 26.0–34.0)
MCHC: 28.9 g/dL — ABNORMAL LOW (ref 30.0–36.0)
MCV: 74.4 fL — ABNORMAL LOW (ref 80.0–100.0)
Monocytes Absolute: 0.5 K/uL (ref 0.1–1.0)
Monocytes Relative: 6 %
Neutro Abs: 6.6 K/uL (ref 1.7–7.7)
Neutrophils Relative %: 71 %
Platelets: 304 K/uL (ref 150–400)
RBC: 6.37 MIL/uL — ABNORMAL HIGH (ref 3.87–5.11)
RDW: 21.1 % — ABNORMAL HIGH (ref 11.5–15.5)
Smear Review: NORMAL
WBC: 9.3 K/uL (ref 4.0–10.5)
nRBC: 0 % (ref 0.0–0.2)

## 2024-08-18 LAB — TYPE AND SCREEN
ABO/RH(D): O POS
Antibody Screen: NEGATIVE

## 2024-08-18 LAB — COMPREHENSIVE METABOLIC PANEL WITH GFR
ALT: 15 U/L (ref 0–44)
AST: 23 U/L (ref 15–41)
Albumin: 4.7 g/dL (ref 3.5–5.0)
Alkaline Phosphatase: 124 U/L (ref 38–126)
Anion gap: 14 (ref 5–15)
BUN: 19 mg/dL (ref 6–20)
CO2: 23 mmol/L (ref 22–32)
Calcium: 10.4 mg/dL — ABNORMAL HIGH (ref 8.9–10.3)
Chloride: 104 mmol/L (ref 98–111)
Creatinine, Ser: 1.1 mg/dL — ABNORMAL HIGH (ref 0.44–1.00)
GFR, Estimated: 58 mL/min — ABNORMAL LOW
Glucose, Bld: 107 mg/dL — ABNORMAL HIGH (ref 70–99)
Potassium: 4.1 mmol/L (ref 3.5–5.1)
Sodium: 142 mmol/L (ref 135–145)
Total Bilirubin: 0.5 mg/dL (ref 0.0–1.2)
Total Protein: 8.5 g/dL — ABNORMAL HIGH (ref 6.5–8.1)

## 2024-08-18 LAB — ABO/RH: ABO/RH(D): O POS

## 2024-08-18 LAB — I-STAT CG4 LACTIC ACID, ED: Lactic Acid, Venous: 1.4 mmol/L (ref 0.5–1.9)

## 2024-08-18 LAB — APTT: aPTT: 37 s — ABNORMAL HIGH (ref 24–36)

## 2024-08-18 MED ORDER — HYDROCODONE-ACETAMINOPHEN 5-325 MG PO TABS: 1.0000 | ORAL_TABLET | Freq: Four times a day (QID) | ORAL | 0 refills | Status: AC | PRN

## 2024-08-18 MED ORDER — ONDANSETRON HCL 4 MG/2ML IJ SOLN
4.0000 mg | Freq: Once | INTRAMUSCULAR | Status: AC
  Administered 2024-08-18: 4 mg via INTRAVENOUS
  Filled 2024-08-18: qty 2

## 2024-08-18 MED ORDER — ONDANSETRON HCL 4 MG PO TABS: 4.0000 mg | ORAL_TABLET | Freq: Three times a day (TID) | ORAL | Status: AC | PRN

## 2024-08-18 MED ORDER — IBUPROFEN 200 MG PO TABS: 400.0000 mg | ORAL_TABLET | Freq: Once | ORAL | Status: DC | PRN

## 2024-08-18 MED ORDER — METHOCARBAMOL 750 MG PO TABS: 750.0000 mg | ORAL_TABLET | Freq: Four times a day (QID) | ORAL | 0 refills | Status: AC

## 2024-08-18 MED ORDER — IOHEXOL 300 MG/ML  SOLN
100.0000 mL | Freq: Once | INTRAMUSCULAR | Status: AC | PRN
  Administered 2024-08-18: 100 mL via INTRAVENOUS

## 2024-08-18 MED ORDER — MORPHINE SULFATE (PF) 4 MG/ML IV SOLN
4.0000 mg | Freq: Once | INTRAVENOUS | Status: AC
  Administered 2024-08-18: 4 mg via INTRAVENOUS
  Filled 2024-08-18: qty 1

## 2024-08-18 MED ORDER — ONDANSETRON 4 MG PO TBDP
4.0000 mg | ORAL_TABLET | Freq: Once | ORAL | Status: AC
  Administered 2024-08-18: 4 mg via ORAL
  Filled 2024-08-18: qty 1
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
## 2024-08-18 NOTE — Discharge Instructions (Signed)
 As we discussed, your CT imaging is negative for any internal injury. No fractures are found. Take the medications as prescribed. Cool compresses to the sore areas for now. After 2 days, you can alternate warm and cool compresses for comfort.   Return to the ED with any worsening symptoms of shortness of breath, severe pain, any fever, uncontrolled vomiting or new concern.
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
## 2024-08-30 ENCOUNTER — Encounter

## 2024-08-30 ENCOUNTER — Other Ambulatory Visit
